# Patient Record
Sex: Female | Born: 1950 | Race: White | Hispanic: No | Smoking: Former smoker
Health system: Southern US, Community
[De-identification: ages and names within clinical notes are randomized; demographics above are authoritative.]

## PROBLEM LIST (undated history)

## (undated) DIAGNOSIS — I495 Sick sinus syndrome: Secondary | ICD-10-CM

## (undated) DIAGNOSIS — Z974 Presence of external hearing-aid: Secondary | ICD-10-CM

## (undated) DIAGNOSIS — M810 Age-related osteoporosis without current pathological fracture: Secondary | ICD-10-CM

## (undated) DIAGNOSIS — I10 Essential (primary) hypertension: Secondary | ICD-10-CM

## (undated) DIAGNOSIS — Z973 Presence of spectacles and contact lenses: Secondary | ICD-10-CM

## (undated) DIAGNOSIS — Z950 Presence of cardiac pacemaker: Secondary | ICD-10-CM

## (undated) DIAGNOSIS — G709 Myoneural disorder, unspecified: Secondary | ICD-10-CM

## (undated) DIAGNOSIS — T4145XA Adverse effect of unspecified anesthetic, initial encounter: Secondary | ICD-10-CM

## (undated) DIAGNOSIS — I82409 Acute embolism and thrombosis of unspecified deep veins of unspecified lower extremity: Secondary | ICD-10-CM

## (undated) DIAGNOSIS — I2699 Other pulmonary embolism without acute cor pulmonale: Secondary | ICD-10-CM

## (undated) DIAGNOSIS — D472 Monoclonal gammopathy: Secondary | ICD-10-CM

## (undated) DIAGNOSIS — D869 Sarcoidosis, unspecified: Secondary | ICD-10-CM

## (undated) DIAGNOSIS — Z9221 Personal history of antineoplastic chemotherapy: Secondary | ICD-10-CM

## (undated) DIAGNOSIS — Z8719 Personal history of other diseases of the digestive system: Secondary | ICD-10-CM

## (undated) DIAGNOSIS — G4730 Sleep apnea, unspecified: Secondary | ICD-10-CM

## (undated) DIAGNOSIS — C801 Malignant (primary) neoplasm, unspecified: Secondary | ICD-10-CM

## (undated) DIAGNOSIS — H269 Unspecified cataract: Secondary | ICD-10-CM

## (undated) DIAGNOSIS — K219 Gastro-esophageal reflux disease without esophagitis: Secondary | ICD-10-CM

## (undated) DIAGNOSIS — M1990 Unspecified osteoarthritis, unspecified site: Secondary | ICD-10-CM

## (undated) DIAGNOSIS — Z87442 Personal history of urinary calculi: Secondary | ICD-10-CM

## (undated) DIAGNOSIS — R112 Nausea with vomiting, unspecified: Secondary | ICD-10-CM

## (undated) DIAGNOSIS — Z9889 Other specified postprocedural states: Secondary | ICD-10-CM

## (undated) DIAGNOSIS — T8859XA Other complications of anesthesia, initial encounter: Secondary | ICD-10-CM

## (undated) HISTORY — PX: OTHER SURGICAL HISTORY: SHX169

## (undated) HISTORY — PX: COSMETIC SURGERY: SHX468

## (undated) HISTORY — DX: Other pulmonary embolism without acute cor pulmonale: I26.99

## (undated) HISTORY — DX: Age-related osteoporosis without current pathological fracture: M81.0

## (undated) HISTORY — DX: Monoclonal gammopathy: D47.2

## (undated) HISTORY — DX: Unspecified osteoarthritis, unspecified site: M19.90

## (undated) HISTORY — DX: Unspecified cataract: H26.9

## (undated) HISTORY — PX: CARPAL TUNNEL RELEASE: SHX101

## (undated) HISTORY — DX: Sleep apnea, unspecified: G47.30

## (undated) HISTORY — DX: Acute embolism and thrombosis of unspecified deep veins of unspecified lower extremity: I82.409

## (undated) HISTORY — DX: Malignant (primary) neoplasm, unspecified: C80.1

## (undated) HISTORY — PX: HAND SURGERY: SHX662

## (undated) HISTORY — PX: SPINE SURGERY: SHX786

---
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
## 1973-10-05 HISTORY — PX: GANGLION CYST EXCISION: SHX1691

## 1978-10-05 HISTORY — PX: OTHER SURGICAL HISTORY: SHX169

## 1978-10-05 HISTORY — PX: ECTOPIC PREGNANCY SURGERY: SHX613

## 1995-10-06 HISTORY — PX: BACK SURGERY: SHX140

## 1998-11-01 ENCOUNTER — Other Ambulatory Visit: Admission: RE | Admit: 1998-11-01 | Discharge: 1998-11-01 | Payer: Self-pay | Admitting: Family Medicine

## 2000-09-16 ENCOUNTER — Other Ambulatory Visit: Admission: RE | Admit: 2000-09-16 | Discharge: 2000-09-16 | Payer: Self-pay | Admitting: Obstetrics and Gynecology

## 2000-09-17 ENCOUNTER — Encounter (INDEPENDENT_AMBULATORY_CARE_PROVIDER_SITE_OTHER): Payer: Self-pay | Admitting: Specialist

## 2000-09-17 ENCOUNTER — Other Ambulatory Visit: Admission: RE | Admit: 2000-09-17 | Discharge: 2000-09-17 | Payer: Self-pay | Admitting: Obstetrics and Gynecology

## 2001-05-23 ENCOUNTER — Ambulatory Visit (HOSPITAL_COMMUNITY): Admission: RE | Admit: 2001-05-23 | Discharge: 2001-05-23 | Payer: Self-pay | Admitting: Gastroenterology

## 2003-05-21 ENCOUNTER — Encounter: Payer: Self-pay | Admitting: Family Medicine

## 2003-05-21 ENCOUNTER — Ambulatory Visit (HOSPITAL_COMMUNITY): Admission: RE | Admit: 2003-05-21 | Discharge: 2003-05-21 | Payer: Self-pay | Admitting: Family Medicine

## 2005-10-05 HISTORY — PX: COSMETIC SURGERY: SHX468

## 2009-06-10 ENCOUNTER — Emergency Department (HOSPITAL_COMMUNITY): Admission: EM | Admit: 2009-06-10 | Discharge: 2009-06-10 | Payer: Self-pay | Admitting: Family Medicine

## 2009-10-05 DIAGNOSIS — Z9221 Personal history of antineoplastic chemotherapy: Secondary | ICD-10-CM

## 2009-10-05 DIAGNOSIS — C801 Malignant (primary) neoplasm, unspecified: Secondary | ICD-10-CM

## 2009-10-05 HISTORY — DX: Malignant (primary) neoplasm, unspecified: C80.1

## 2009-10-05 HISTORY — PX: ABDOMINAL HYSTERECTOMY: SHX81

## 2009-10-05 HISTORY — PX: OTHER SURGICAL HISTORY: SHX169

## 2009-10-05 HISTORY — DX: Personal history of antineoplastic chemotherapy: Z92.21

## 2009-10-05 HISTORY — PX: APPENDECTOMY: SHX54

## 2009-11-11 ENCOUNTER — Encounter: Admission: RE | Admit: 2009-11-11 | Discharge: 2009-11-11 | Payer: Self-pay | Admitting: Family Medicine

## 2009-11-28 ENCOUNTER — Ambulatory Visit: Admission: RE | Admit: 2009-11-28 | Discharge: 2009-11-28 | Payer: Self-pay | Admitting: Gynecologic Oncology

## 2009-12-03 ENCOUNTER — Inpatient Hospital Stay (HOSPITAL_COMMUNITY): Admission: RE | Admit: 2009-12-03 | Discharge: 2009-12-05 | Payer: Self-pay | Admitting: Obstetrics & Gynecology

## 2009-12-03 ENCOUNTER — Encounter (INDEPENDENT_AMBULATORY_CARE_PROVIDER_SITE_OTHER): Payer: Self-pay | Admitting: Obstetrics & Gynecology

## 2009-12-05 ENCOUNTER — Ambulatory Visit: Payer: Self-pay | Admitting: Oncology

## 2009-12-12 ENCOUNTER — Ambulatory Visit: Admission: RE | Admit: 2009-12-12 | Discharge: 2009-12-12 | Payer: Self-pay | Admitting: Gynecologic Oncology

## 2009-12-23 LAB — COMPREHENSIVE METABOLIC PANEL
ALT: 14 U/L (ref 0–35)
Alkaline Phosphatase: 89 U/L (ref 39–117)
BUN: 12 mg/dL (ref 6–23)
Chloride: 102 mEq/L (ref 96–112)
Creatinine, Ser: 0.62 mg/dL (ref 0.40–1.20)
Glucose, Bld: 99 mg/dL (ref 70–99)
Sodium: 138 mEq/L (ref 135–145)
Total Bilirubin: 0.4 mg/dL (ref 0.3–1.2)
Total Protein: 6.8 g/dL (ref 6.0–8.3)

## 2009-12-23 LAB — CBC WITH DIFFERENTIAL/PLATELET
BASO%: 0.3 % (ref 0.0–2.0)
Basophils Absolute: 0 10*3/uL (ref 0.0–0.1)
EOS%: 2 % (ref 0.0–7.0)
LYMPH%: 28.7 % (ref 14.0–49.7)
MCH: 29.8 pg (ref 25.1–34.0)
NEUT%: 61 % (ref 38.4–76.8)
RBC: 4.02 10*6/uL (ref 3.70–5.45)

## 2009-12-23 LAB — CA 125: CA 125: 32.7 U/mL — ABNORMAL HIGH (ref 0.0–30.2)

## 2010-01-01 LAB — CBC WITH DIFFERENTIAL/PLATELET
BASO%: 0.4 % (ref 0.0–2.0)
Basophils Absolute: 0 10*3/uL (ref 0.0–0.1)
HGB: 11.7 g/dL (ref 11.6–15.9)
MCV: 87.1 fL (ref 79.5–101.0)
MONO%: 2.6 % (ref 0.0–14.0)
NEUT%: 51.5 % (ref 38.4–76.8)
Platelets: 153 10*3/uL (ref 145–400)
WBC: 3 10*3/uL — ABNORMAL LOW (ref 3.9–10.3)

## 2010-01-06 ENCOUNTER — Ambulatory Visit: Payer: Self-pay | Admitting: Oncology

## 2010-01-06 LAB — CBC WITH DIFFERENTIAL/PLATELET
BASO%: 0.4 % (ref 0.0–2.0)
Basophils Absolute: 0 10*3/uL (ref 0.0–0.1)
EOS%: 2.5 % (ref 0.0–7.0)
Eosinophils Absolute: 0 10*3/uL (ref 0.0–0.5)
HCT: 31.2 % — ABNORMAL LOW (ref 34.8–46.6)
HGB: 10.7 g/dL — ABNORMAL LOW (ref 11.6–15.9)
LYMPH%: 73.5 % — ABNORMAL HIGH (ref 14.0–49.7)
MCH: 29.9 pg (ref 25.1–34.0)
NEUT%: 6 % — ABNORMAL LOW (ref 38.4–76.8)
RBC: 3.58 10*6/uL — ABNORMAL LOW (ref 3.70–5.45)
WBC: 1.7 10*3/uL — ABNORMAL LOW (ref 3.9–10.3)
lymph#: 1.2 10*3/uL (ref 0.9–3.3)

## 2010-01-07 LAB — CBC WITH DIFFERENTIAL/PLATELET
BASO%: 0.8 % (ref 0.0–2.0)
Basophils Absolute: 0 10*3/uL (ref 0.0–0.1)
EOS%: 2.4 % (ref 0.0–7.0)
Eosinophils Absolute: 0.1 10*3/uL (ref 0.0–0.5)
MCHC: 34.1 g/dL (ref 31.5–36.0)
MCV: 87.6 fL (ref 79.5–101.0)
MONO#: 0.5 10*3/uL (ref 0.1–0.9)
NEUT#: 0.6 10*3/uL — ABNORMAL LOW (ref 1.5–6.5)
WBC: 2.7 10*3/uL — ABNORMAL LOW (ref 3.9–10.3)
lymph#: 1.4 10*3/uL (ref 0.9–3.3)

## 2010-01-08 LAB — COMPREHENSIVE METABOLIC PANEL
Albumin: 4.3 g/dL (ref 3.5–5.2)
Chloride: 103 mEq/L (ref 96–112)
Creatinine, Ser: 0.62 mg/dL (ref 0.40–1.20)
Potassium: 3.5 mEq/L (ref 3.5–5.3)
Sodium: 140 mEq/L (ref 135–145)
Total Bilirubin: 0.3 mg/dL (ref 0.3–1.2)

## 2010-01-08 LAB — CBC WITH DIFFERENTIAL/PLATELET
BASO%: 0.3 % (ref 0.0–2.0)
HGB: 11.3 g/dL — ABNORMAL LOW (ref 11.6–15.9)
LYMPH%: 29.4 % (ref 14.0–49.7)
MCH: 30.1 pg (ref 25.1–34.0)
MCHC: 34 g/dL (ref 31.5–36.0)
MONO#: 0.7 10*3/uL (ref 0.1–0.9)
MONO%: 13.6 % (ref 0.0–14.0)
NEUT#: 2.8 10*3/uL (ref 1.5–6.5)
RBC: 3.77 10*6/uL (ref 3.70–5.45)
RDW: 13.5 % (ref 11.2–14.5)

## 2010-01-08 LAB — CA 125: CA 125: 12.5 U/mL (ref 0.0–30.2)

## 2010-01-14 LAB — CBC WITH DIFFERENTIAL/PLATELET
BASO%: 0.2 % (ref 0.0–2.0)
EOS%: 0.4 % (ref 0.0–7.0)
Eosinophils Absolute: 0 10*3/uL (ref 0.0–0.5)
HCT: 33 % — ABNORMAL LOW (ref 34.8–46.6)
HGB: 11.1 g/dL — ABNORMAL LOW (ref 11.6–15.9)
LYMPH%: 43.8 % (ref 14.0–49.7)
MCH: 29.3 pg (ref 25.1–34.0)
NEUT#: 2.3 10*3/uL (ref 1.5–6.5)
RBC: 3.79 10*6/uL (ref 3.70–5.45)
RDW: 14.6 % — ABNORMAL HIGH (ref 11.2–14.5)
WBC: 4.7 10*3/uL (ref 3.9–10.3)

## 2010-01-21 ENCOUNTER — Ambulatory Visit: Admission: RE | Admit: 2010-01-21 | Discharge: 2010-01-21 | Payer: Self-pay | Admitting: Gynecologic Oncology

## 2010-01-21 LAB — CBC WITH DIFFERENTIAL/PLATELET
Basophils Absolute: 0 10*3/uL (ref 0.0–0.1)
HGB: 11.5 g/dL — ABNORMAL LOW (ref 11.6–15.9)
MCV: 88 fL (ref 79.5–101.0)
MONO%: 8.4 % (ref 0.0–14.0)
NEUT#: 2 10*3/uL (ref 1.5–6.5)
Platelets: 209 10*3/uL (ref 145–400)
RBC: 3.91 10*6/uL (ref 3.70–5.45)
RDW: 15.2 % — ABNORMAL HIGH (ref 11.2–14.5)
WBC: 4.5 10*3/uL (ref 3.9–10.3)
lymph#: 2.1 10*3/uL (ref 0.9–3.3)

## 2010-01-28 LAB — CA 125: CA 125: 9.9 U/mL (ref 0.0–30.2)

## 2010-01-28 LAB — CBC WITH DIFFERENTIAL/PLATELET
BASO%: 0.3 % (ref 0.0–2.0)
Basophils Absolute: 0 10*3/uL (ref 0.0–0.1)
EOS%: 1.7 % (ref 0.0–7.0)
HGB: 11.1 g/dL — ABNORMAL LOW (ref 11.6–15.9)
LYMPH%: 46.3 % (ref 14.0–49.7)
MCHC: 34.3 g/dL (ref 31.5–36.0)
MCV: 89.4 fL (ref 79.5–101.0)
MONO#: 0.5 10*3/uL (ref 0.1–0.9)
NEUT#: 1.2 10*3/uL — ABNORMAL LOW (ref 1.5–6.5)
RBC: 3.64 10*6/uL — ABNORMAL LOW (ref 3.70–5.45)
RDW: 16.2 % — ABNORMAL HIGH (ref 11.2–14.5)

## 2010-01-28 LAB — COMPREHENSIVE METABOLIC PANEL
Alkaline Phosphatase: 85 U/L (ref 39–117)
BUN: 11 mg/dL (ref 6–23)
Chloride: 101 mEq/L (ref 96–112)
Creatinine, Ser: 0.63 mg/dL (ref 0.40–1.20)
Sodium: 137 mEq/L (ref 135–145)
Total Protein: 6.3 g/dL (ref 6.0–8.3)

## 2010-02-03 LAB — CBC WITH DIFFERENTIAL/PLATELET
BASO%: 0.1 % (ref 0.0–2.0)
Basophils Absolute: 0 10*3/uL (ref 0.0–0.1)
HGB: 10.8 g/dL — ABNORMAL LOW (ref 11.6–15.9)
MCH: 29.6 pg (ref 25.1–34.0)
MCHC: 33.8 g/dL (ref 31.5–36.0)
MONO%: 8.2 % (ref 0.0–14.0)
NEUT#: 10.1 10*3/uL — ABNORMAL HIGH (ref 1.5–6.5)
RBC: 3.65 10*6/uL — ABNORMAL LOW (ref 3.70–5.45)
RDW: 15.6 % — ABNORMAL HIGH (ref 11.2–14.5)
WBC: 13.2 10*3/uL — ABNORMAL HIGH (ref 3.9–10.3)
lymph#: 2.1 10*3/uL (ref 0.9–3.3)

## 2010-02-11 ENCOUNTER — Ambulatory Visit: Payer: Self-pay | Admitting: Oncology

## 2010-02-11 LAB — CBC WITH DIFFERENTIAL/PLATELET
BASO%: 0.2 % (ref 0.0–2.0)
Basophils Absolute: 0 10*3/uL (ref 0.0–0.1)
HCT: 31.8 % — ABNORMAL LOW (ref 34.8–46.6)
HGB: 10.7 g/dL — ABNORMAL LOW (ref 11.6–15.9)
LYMPH%: 29.9 % (ref 14.0–49.7)
MCH: 29.6 pg (ref 25.1–34.0)
MCHC: 33.6 g/dL (ref 31.5–36.0)
MCV: 87.8 fL (ref 79.5–101.0)
MONO#: 0.5 10*3/uL (ref 0.1–0.9)
NEUT%: 59.5 % (ref 38.4–76.8)
Platelets: 189 10*3/uL (ref 145–400)
RBC: 3.62 10*6/uL — ABNORMAL LOW (ref 3.70–5.45)

## 2010-02-19 ENCOUNTER — Ambulatory Visit: Admission: RE | Admit: 2010-02-19 | Discharge: 2010-02-19 | Payer: Self-pay | Admitting: Gynecologic Oncology

## 2010-02-19 LAB — CBC WITH DIFFERENTIAL/PLATELET
EOS%: 0.3 % (ref 0.0–7.0)
Eosinophils Absolute: 0 10*3/uL (ref 0.0–0.5)
HGB: 10.7 g/dL — ABNORMAL LOW (ref 11.6–15.9)
MCHC: 34.5 g/dL (ref 31.5–36.0)
MCV: 89.8 fL (ref 79.5–101.0)
MONO#: 0.7 10*3/uL (ref 0.1–0.9)
RDW: 17.7 % — ABNORMAL HIGH (ref 11.2–14.5)
WBC: 5.7 10*3/uL (ref 3.9–10.3)
lymph#: 1.2 10*3/uL (ref 0.9–3.3)

## 2010-02-19 LAB — COMPREHENSIVE METABOLIC PANEL
ALT: <8 U/L (ref 0–35)
AST: 13 U/L (ref 0–37)
Albumin: 4 g/dL (ref 3.5–5.2)
Alkaline Phosphatase: 113 U/L (ref 39–117)
Calcium: 9.1 mg/dL (ref 8.4–10.5)
Chloride: 103 mEq/L (ref 96–112)
Creatinine, Ser: 0.59 mg/dL (ref 0.40–1.20)
Sodium: 139 mEq/L (ref 135–145)
Total Protein: 6.3 g/dL (ref 6.0–8.3)

## 2010-02-20 ENCOUNTER — Ambulatory Visit: Payer: Self-pay | Admitting: Genetic Counselor

## 2010-03-05 LAB — CBC WITH DIFFERENTIAL/PLATELET
BASO%: 0.7 % (ref 0.0–2.0)
Basophils Absolute: 0 10*3/uL (ref 0.0–0.1)
EOS%: 0.2 % (ref 0.0–7.0)
Eosinophils Absolute: 0 10*3/uL (ref 0.0–0.5)
HCT: 30.2 % — ABNORMAL LOW (ref 34.8–46.6)
HGB: 10.7 g/dL — ABNORMAL LOW (ref 11.6–15.9)
LYMPH%: 30.1 % (ref 14.0–49.7)
MCH: 32.1 pg (ref 25.1–34.0)
MCHC: 35.4 g/dL (ref 31.5–36.0)
MCV: 90.7 fL (ref 79.5–101.0)
MONO#: 0.4 10*3/uL (ref 0.1–0.9)
MONO%: 9.2 % (ref 0.0–14.0)
NEUT#: 2.7 10*3/uL (ref 1.5–6.5)
NEUT%: 59.8 % (ref 38.4–76.8)
Platelets: 127 10*3/uL — ABNORMAL LOW (ref 145–400)
RBC: 3.33 10*6/uL — ABNORMAL LOW (ref 3.70–5.45)
RDW: 19.8 % — ABNORMAL HIGH (ref 11.2–14.5)
WBC: 4.6 10*3/uL (ref 3.9–10.3)
lymph#: 1.4 10*3/uL (ref 0.9–3.3)

## 2010-03-20 ENCOUNTER — Ambulatory Visit: Payer: Self-pay | Admitting: Oncology

## 2010-03-21 LAB — CBC WITH DIFFERENTIAL/PLATELET
Basophils Absolute: 0 10*3/uL (ref 0.0–0.1)
EOS%: 0.5 % (ref 0.0–7.0)
Eosinophils Absolute: 0 10*3/uL (ref 0.0–0.5)
HCT: 29.6 % — ABNORMAL LOW (ref 34.8–46.6)
HGB: 10.1 g/dL — ABNORMAL LOW (ref 11.6–15.9)
MCH: 32 pg (ref 25.1–34.0)
MCV: 94.1 fL (ref 79.5–101.0)
MONO#: 0.4 10*3/uL (ref 0.1–0.9)
MONO%: 7.1 % (ref 0.0–14.0)
RBC: 3.14 10*6/uL — ABNORMAL LOW (ref 3.70–5.45)
RDW: 19.2 % — ABNORMAL HIGH (ref 11.2–14.5)
WBC: 5.4 10*3/uL (ref 3.9–10.3)
lymph#: 1.3 10*3/uL (ref 0.9–3.3)

## 2010-03-21 LAB — COMPREHENSIVE METABOLIC PANEL
AST: 12 U/L (ref 0–37)
Alkaline Phosphatase: 110 U/L (ref 39–117)
BUN: 11 mg/dL (ref 6–23)
Chloride: 103 mEq/L (ref 96–112)
Sodium: 141 mEq/L (ref 135–145)
Total Bilirubin: 0.3 mg/dL (ref 0.3–1.2)

## 2010-03-21 LAB — CA 125: CA 125: 8.9 U/mL (ref 0.0–30.2)

## 2010-03-26 LAB — CBC WITH DIFFERENTIAL/PLATELET
EOS%: 0 % (ref 0.0–7.0)
Eosinophils Absolute: 0 10*3/uL (ref 0.0–0.5)
MCHC: 34.3 g/dL (ref 31.5–36.0)
MONO#: 0 10*3/uL — ABNORMAL LOW (ref 0.1–0.9)
MONO%: 0.1 % (ref 0.0–14.0)
NEUT%: 84.8 % — ABNORMAL HIGH (ref 38.4–76.8)
RBC: 3.38 10*6/uL — ABNORMAL LOW (ref 3.70–5.45)
RDW: 18.6 % — ABNORMAL HIGH (ref 11.2–14.5)
WBC: 6.2 10*3/uL (ref 3.9–10.3)
lymph#: 0.9 10*3/uL (ref 0.9–3.3)

## 2010-04-11 LAB — CBC WITH DIFFERENTIAL/PLATELET
BASO%: 0.3 % (ref 0.0–2.0)
Eosinophils Absolute: 0 10*3/uL (ref 0.0–0.5)
HCT: 30 % — ABNORMAL LOW (ref 34.8–46.6)
MCH: 32.5 pg (ref 25.1–34.0)
MCHC: 33.8 g/dL (ref 31.5–36.0)
MCV: 96.1 fL (ref 79.5–101.0)
MONO#: 0.5 10*3/uL (ref 0.1–0.9)
MONO%: 9.7 % (ref 0.0–14.0)
NEUT#: 3 10*3/uL (ref 1.5–6.5)
NEUT%: 59.7 % (ref 38.4–76.8)
RBC: 3.12 10*6/uL — ABNORMAL LOW (ref 3.70–5.45)

## 2010-04-11 LAB — COMPREHENSIVE METABOLIC PANEL
Alkaline Phosphatase: 115 U/L (ref 39–117)
CO2: 24 mEq/L (ref 19–32)
Calcium: 9.2 mg/dL (ref 8.4–10.5)
Total Bilirubin: 0.4 mg/dL (ref 0.3–1.2)

## 2010-04-16 LAB — CBC WITH DIFFERENTIAL/PLATELET
BASO%: 0.2 % (ref 0.0–2.0)
EOS%: 0 % (ref 0.0–7.0)
Eosinophils Absolute: 0 10*3/uL (ref 0.0–0.5)
HCT: 32.4 % — ABNORMAL LOW (ref 34.8–46.6)
HGB: 10.9 g/dL — ABNORMAL LOW (ref 11.6–15.9)
LYMPH%: 14.9 % (ref 14.0–49.7)
MCHC: 33.6 g/dL (ref 31.5–36.0)
MCV: 94.5 fL (ref 79.5–101.0)
MONO#: 0 10*3/uL — ABNORMAL LOW (ref 0.1–0.9)
MONO%: 0.4 % (ref 0.0–14.0)
NEUT#: 4 10*3/uL (ref 1.5–6.5)
Platelets: 117 10*3/uL — ABNORMAL LOW (ref 145–400)
RBC: 3.43 10*6/uL — ABNORMAL LOW (ref 3.70–5.45)
RDW: 15.9 % — ABNORMAL HIGH (ref 11.2–14.5)
lymph#: 0.7 10*3/uL — ABNORMAL LOW (ref 0.9–3.3)

## 2010-04-24 ENCOUNTER — Ambulatory Visit: Payer: Self-pay | Admitting: Oncology

## 2010-04-28 LAB — CBC WITH DIFFERENTIAL/PLATELET
Basophils Absolute: 0 10*3/uL (ref 0.0–0.1)
EOS%: 0.7 % (ref 0.0–7.0)
Eosinophils Absolute: 0 10*3/uL (ref 0.0–0.5)
HCT: 29.8 % — ABNORMAL LOW (ref 34.8–46.6)
MCHC: 34 g/dL (ref 31.5–36.0)
MONO%: 3.3 % (ref 0.0–14.0)
RBC: 3.02 10*6/uL — ABNORMAL LOW (ref 3.70–5.45)
WBC: 7 10*3/uL (ref 3.9–10.3)

## 2010-05-20 LAB — CBC WITH DIFFERENTIAL/PLATELET
Eosinophils Absolute: 0 10*3/uL (ref 0.0–0.5)
LYMPH%: 43.5 % (ref 14.0–49.7)
MCH: 34.7 pg — ABNORMAL HIGH (ref 25.1–34.0)
MCHC: 34.8 g/dL (ref 31.5–36.0)
MCV: 99.5 fL (ref 79.5–101.0)
MONO#: 0.3 10*3/uL (ref 0.1–0.9)
NEUT#: 1.5 10*3/uL (ref 1.5–6.5)
NEUT%: 45.9 % (ref 38.4–76.8)
RBC: 3.19 10*6/uL — ABNORMAL LOW (ref 3.70–5.45)
RDW: 15.2 % — ABNORMAL HIGH (ref 11.2–14.5)
WBC: 3.3 10*3/uL — ABNORMAL LOW (ref 3.9–10.3)
lymph#: 1.4 10*3/uL (ref 0.9–3.3)

## 2010-05-20 LAB — BASIC METABOLIC PANEL
Chloride: 102 mEq/L (ref 96–112)
Creatinine, Ser: 0.59 mg/dL (ref 0.40–1.20)
Potassium: 3.9 mEq/L (ref 3.5–5.3)

## 2010-05-23 ENCOUNTER — Ambulatory Visit (HOSPITAL_COMMUNITY): Admission: RE | Admit: 2010-05-23 | Discharge: 2010-05-23 | Payer: Self-pay | Admitting: Oncology

## 2010-06-12 ENCOUNTER — Ambulatory Visit: Admission: RE | Admit: 2010-06-12 | Discharge: 2010-06-12 | Payer: Self-pay | Admitting: Gynecologic Oncology

## 2010-06-23 DIAGNOSIS — E785 Hyperlipidemia, unspecified: Secondary | ICD-10-CM

## 2010-06-23 DIAGNOSIS — Z8543 Personal history of malignant neoplasm of ovary: Secondary | ICD-10-CM | POA: Insufficient documentation

## 2010-06-23 DIAGNOSIS — C569 Malignant neoplasm of unspecified ovary: Secondary | ICD-10-CM

## 2010-06-23 DIAGNOSIS — I10 Essential (primary) hypertension: Secondary | ICD-10-CM | POA: Insufficient documentation

## 2010-06-23 DIAGNOSIS — C50919 Malignant neoplasm of unspecified site of unspecified female breast: Secondary | ICD-10-CM | POA: Insufficient documentation

## 2010-06-24 ENCOUNTER — Ambulatory Visit: Payer: Self-pay | Admitting: Internal Medicine

## 2010-06-24 DIAGNOSIS — R930 Abnormal findings on diagnostic imaging of skull and head, not elsewhere classified: Secondary | ICD-10-CM | POA: Insufficient documentation

## 2010-07-04 ENCOUNTER — Telehealth (INDEPENDENT_AMBULATORY_CARE_PROVIDER_SITE_OTHER): Payer: Self-pay | Admitting: *Deleted

## 2010-09-17 ENCOUNTER — Ambulatory Visit: Payer: Self-pay | Admitting: Oncology

## 2010-09-19 LAB — COMPREHENSIVE METABOLIC PANEL
ALT: 12 U/L (ref 0–35)
AST: 17 U/L (ref 0–37)
Albumin: 4 g/dL (ref 3.5–5.2)
Alkaline Phosphatase: 78 U/L (ref 39–117)
BUN: 20 mg/dL (ref 6–23)
CO2: 27 mEq/L (ref 19–32)
Calcium: 9.2 mg/dL (ref 8.4–10.5)
Chloride: 105 mEq/L (ref 96–112)
Creatinine, Ser: 0.57 mg/dL (ref 0.40–1.20)
Glucose, Bld: 82 mg/dL (ref 70–99)
Potassium: 3.8 mEq/L (ref 3.5–5.3)
Sodium: 141 mEq/L (ref 135–145)
Total Bilirubin: 0.4 mg/dL (ref 0.3–1.2)
Total Protein: 6.6 g/dL (ref 6.0–8.3)

## 2010-09-19 LAB — CBC WITH DIFFERENTIAL/PLATELET
BASO%: 0.3 % (ref 0.0–2.0)
Basophils Absolute: 0 10*3/uL (ref 0.0–0.1)
EOS%: 2.6 % (ref 0.0–7.0)
Eosinophils Absolute: 0.1 10*3/uL (ref 0.0–0.5)
HCT: 33.6 % — ABNORMAL LOW (ref 34.8–46.6)
HGB: 11.6 g/dL (ref 11.6–15.9)
LYMPH%: 38 % (ref 14.0–49.7)
MCH: 31.5 pg (ref 25.1–34.0)
MCHC: 34.7 g/dL (ref 31.5–36.0)
MCV: 90.8 fL (ref 79.5–101.0)
MONO#: 0.3 10*3/uL (ref 0.1–0.9)
MONO%: 8.8 % (ref 0.0–14.0)
NEUT#: 2 10*3/uL (ref 1.5–6.5)
NEUT%: 50.3 % (ref 38.4–76.8)
Platelets: 158 10*3/uL (ref 145–400)
RBC: 3.7 10*6/uL (ref 3.70–5.45)
RDW: 13.3 % (ref 11.2–14.5)
WBC: 3.9 10*3/uL (ref 3.9–10.3)
lymph#: 1.5 10*3/uL (ref 0.9–3.3)

## 2010-09-19 LAB — CA 125: CA 125: 8.4 U/mL (ref 0.0–30.2)

## 2010-11-06 NOTE — Progress Notes (Signed)
Summary: reviewing xray from 1997 > sent down for scanning  Phone Note Call from Patient Call back at [phone]   Caller: Patient Call For: wert Summary of Call: calling about xrays Initial call taken by: Rickard Patience,  July 04, 2010 3:47 PM  Follow-up for Phone Call        called and spoke with pt. pt last saw [facility] 06/24/2010.  Pt states she dropped of cxr films last Thursday 06/26/2010 dated 1997.  Pt wanted to know if MW wanted to look at these to compare to recent films done.  Pt would like MW to call her once he reviews them and would also like to pick films back up once he is finished reviewing them.  Will forward message to MW to address.  Pt is aware MW is out of the office this week and will return next week.  Aundra Millet Reynolds LPN  July 04, 2010 4:06 PM I sent this down to scan and compare to previous studies but I did look at it first and there appears to be minimal changes new vs old films that will need exactly the same f/u I rec at the ov and reflected in the instructions Follow-up by: Nyoka Cowden MD,  July 08, 2010 9:51 AM  Additional Follow-up for Phone Call Additional follow up Details #1::        Spoke with patient-she is aware of MW's recs-spoke with [facility] as patient wanted to pick up originals(states she placed a note on them regarding this). Iris stated there is no way to scan xrays into [facility] when there isnt an appt for a current Xray. Original xrays were mailed back to [facility] on 06-26-10; I called the patient and made her aware of this matter-she will pick up xrays again at [facility] so she can have a copy on hand. Reynaldo Minium CMA  July 08, 2010 10:56 AM

## 2010-11-06 NOTE — Assessment & Plan Note (Signed)
Summary: Pulmonary consultation/ bilat  infiltrates/adenopathy ? chonic ?   Visit Type:  Initial Consult Copy to:  Dr. Darrold Span Primary Provider/Referring Provider:  Dr. Ursula Beath  CC:  Abnornmal CT Chest.  History of Present Illness: 59 yowf quit smoking in 1978 no respiratory.  June 24, 2010  1st pulmonary office eval with hx of  walking pna in early 90's and mutiple xrays never normalized dating back to 1997.    now s/p ovarian ca surgery in March 2011 at [facility] then chemo completed in July.   Problem ct suggested ? evolving upper lobe processes bilaterally.  However, she has not symptoms whatsoever, no cough or sob. .Pt denies any significant sore throat, dysphagia, itching, sneezing,  nasal congestion or excess secretions,  fever, chills, sweats, unintended wt loss, pleuritic or exertional cp, hempoptysis, change in activity tolerance  orthopnea pnd or leg swelling   Current Medications (verified): 1)  Multivitamins  Tabs (Multiple Vitamin) .Marland Kitchen.. 1 Once Daily 2)  Citracal Plus  Tabs (Multiple Minerals-Vitamins) .Marland Kitchen.. 1 Once Daily  Allergies: 1)  ! Cipro 2)  ! * Latex  Past History:  Past Medical History: Hypertension Bilateral upper lobe infiltrates     - Present since 1997 plain cxr     - CT Chest May 23 2010 bilateral upper lobe vol loss, bilateral nonspecific adenopathy  Past Surgical History: TAH BSO 2011 Back surgery 1997  Family History: Breast CA- Paternal Aunt and Cousins  Colon CA- [employer] Prostate CA- Father  Heart dz- MGF  Social History: Divorced 2 daughters Former smoker. Quit in 1978.  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    Vital Signs:  Patient profile:   60 year old female Height:      65 inches Weight:      172.38 pounds BMI:     28.79 O2 Sat:      97 % on Room air Temp:     98.8 degrees F oral Pulse rate:   63 / minute BP sitting:   152 / 82  (left arm)  Vitals Entered By: Vernie Murders (June 24, 2010 10:27 AM)  O2 Flow:  Room air  Physical Exam  Additional Exam:  amb pleasant wf nad wt 172 June 24, 2010 HEENT: nl dentition, turbinates, and orophanx. Nl external ear canals without cough reflex NECK :  without JVD/Nodes/TM/ nl carotid upstrokes bilaterally LUNGS: no acc muscle use, clear to A and P bilaterally without cough on insp or exp maneuvers CV:  RRR  no s3 or murmur or increase in P2, no edema  ABD:  soft and nontender with nl excursion in the supine position. No bruits or organomegaly, bowel sounds nl MS:  warm without deformities, calf tenderness, cyanosis or clubbing SKIN: warm and dry without lesions   NEURO:  alert, approp, no deficits     Impression & Recommendations:  Problem # 1:  ABNORMAL LUNG XRAY (ICD-793.1)  Completely asymptomatic and present since 2007 with very minimal  serial change c/w "burned out" sarcoid or other inflammatory process (note h/o " walking pna never cleared" )    Discussed in detail all the  indications, usual  risks and alternatives  relative to the benefits with patient who agrees to proceed with conservative f/u with chest ct in one year per guidelines given the fact she'll need surveillance/ staging anyway at that point and this would not expose her to additional rt unneccessarily.  Orders: Consultation Level IV (04540)  Medications Added to  Medication List This Visit: 1)  Multivitamins Tabs (Multiple vitamin) .Marland Kitchen.. 1 once daily 2)  Citracal Plus Tabs (Multiple minerals-vitamins) .Marland Kitchen.. 1 once daily  Patient Instructions: 1)  It is reasonable to do a full ct chest and abd as part of your follow up and staging for ovarian cancer in 05/2011 (vs PET but not both) but call sooner for usual cough or pain when breath and if you can remember to bring your old cxr with you.

## 2010-12-09 ENCOUNTER — Other Ambulatory Visit: Payer: Self-pay | Admitting: Obstetrics & Gynecology

## 2010-12-26 LAB — DIFFERENTIAL
Basophils Relative: 0 % (ref 0–1)
Eosinophils Absolute: 0.1 10*3/uL (ref 0.0–0.7)
Lymphs Abs: 1.4 10*3/uL (ref 0.7–4.0)
Monocytes Absolute: 0.4 10*3/uL (ref 0.1–1.0)
Monocytes Relative: 8 % (ref 3–12)

## 2010-12-26 LAB — COMPREHENSIVE METABOLIC PANEL
ALT: 14 U/L (ref 0–35)
AST: 19 U/L (ref 0–37)
Albumin: 3.4 g/dL — ABNORMAL LOW (ref 3.5–5.2)
Alkaline Phosphatase: 85 U/L (ref 39–117)
Calcium: 9.4 mg/dL (ref 8.4–10.5)
GFR calc Af Amer: >60 mL/min (ref >60)
Potassium: 3.4 mEq/L — ABNORMAL LOW (ref 3.5–5.1)
Sodium: 139 mEq/L (ref 135–145)
Total Protein: 6.6 g/dL (ref 6.0–8.3)

## 2010-12-26 LAB — CBC
HCT: 37.5 % (ref 36.0–46.0)
MCHC: 34.1 g/dL (ref 30.0–36.0)
Platelets: 193 10*3/uL (ref 150–400)
WBC: 5.1 10*3/uL (ref 4.0–10.5)

## 2010-12-29 LAB — BASIC METABOLIC PANEL
CO2: 30 mEq/L (ref 19–32)
Calcium: 7.6 mg/dL — ABNORMAL LOW (ref 8.4–10.5)
GFR calc Af Amer: >60 mL/min (ref >60)
GFR calc non Af Amer: >60 mL/min (ref >60)
Glucose, Bld: 130 mg/dL — ABNORMAL HIGH (ref 70–99)
Potassium: 3.4 mEq/L — ABNORMAL LOW (ref 3.5–5.1)
Sodium: 136 mEq/L (ref 135–145)

## 2010-12-29 LAB — CBC
HCT: 31.4 % — ABNORMAL LOW (ref 36.0–46.0)
Hemoglobin: 10.1 g/dL — ABNORMAL LOW (ref 12.0–15.0)
MCHC: 32.2 g/dL (ref 30.0–36.0)
RBC: 3.56 MIL/uL — ABNORMAL LOW (ref 3.87–5.11)
RDW: 13.1 % (ref 11.5–15.5)

## 2011-02-20 NOTE — Procedures (Signed)
[facility]  Patient:    Julia Solis Visit Number: [id number] MRN: [id number]          Service Type: Attending:  Fayrene Fearing L. Randa Evens, M.D. Proc. Date: 05/23/01   CC:         Doreatha Lew, M.D.   Procedure Report  PROCEDURE:  Colonoscopy.  MEDICATIONS:  Fentanyl 125 mcg, Versed 10 mg IV.  SCOPE:  Pediatric video colonoscope.  INDICATIONS:  Heme-positive stool and rectal bleeding.  DESCRIPTION OF PROCEDURE:  The procedure had been explained to the patient and consent obtained.  With the patient in the left lateral decubitus position, the Olympus pediatric video colonoscope was inserted and advanced under direct visualization.  The prep was excellent.  We were able to advance to the cecum. The ileocecal valve was entered for a short distance and was normal.  The scope was withdrawn and the colon carefully examined on withdrawal.  The colonic mucosa was completely normal throughout.  No polyps or other lesions were seen.  No significant diverticulosis.  There were internal hemorrhoids seen in the rectum.  The scope was withdrawn, and The patient tolerated the procedure well and was maintained on low-flow oxygen and pulse oximetry throughout the procedure.  ASSESSMENT:  Internal hemorrhoids, probably the source of rectal bleeding.  PLAN:  We will give a hemorrhoid sheet.  Routine follow-up with yearly Hemoccults, etc. Attending:  Llana Aliment. Randa Evens, M.D. DD:  05/23/01 TD:  05/23/01 Job: [id number]

## 2011-03-27 ENCOUNTER — Other Ambulatory Visit: Payer: Self-pay | Admitting: Oncology

## 2011-03-27 ENCOUNTER — Encounter (HOSPITAL_BASED_OUTPATIENT_CLINIC_OR_DEPARTMENT_OTHER): Payer: BC Managed Care – PPO | Admitting: Oncology

## 2011-03-27 DIAGNOSIS — C569 Malignant neoplasm of unspecified ovary: Secondary | ICD-10-CM

## 2011-03-27 DIAGNOSIS — Z5111 Encounter for antineoplastic chemotherapy: Secondary | ICD-10-CM

## 2011-03-27 DIAGNOSIS — Z5189 Encounter for other specified aftercare: Secondary | ICD-10-CM

## 2011-03-27 LAB — CBC WITH DIFFERENTIAL/PLATELET
Basophils Absolute: 0 10*3/uL (ref 0.0–0.1)
EOS%: 2.3 % (ref 0.0–7.0)
Eosinophils Absolute: 0.1 10*3/uL (ref 0.0–0.5)
HGB: 12.4 g/dL (ref 11.6–15.9)
LYMPH%: 38.2 % (ref 14.0–49.7)
MCH: 30.7 pg (ref 25.1–34.0)
MCV: 89 fL (ref 79.5–101.0)
MONO%: 8.6 % (ref 0.0–14.0)
NEUT#: 2.5 10*3/uL (ref 1.5–6.5)
Platelets: 180 10*3/uL (ref 145–400)
RDW: 12.9 % (ref 11.2–14.5)

## 2011-03-27 LAB — COMPREHENSIVE METABOLIC PANEL
AST: 18 U/L (ref 0–37)
Albumin: 4.3 g/dL (ref 3.5–5.2)
Alkaline Phosphatase: 75 U/L (ref 39–117)
BUN: 16 mg/dL (ref 6–23)
Creatinine, Ser: 0.68 mg/dL (ref 0.50–1.10)
Glucose, Bld: 86 mg/dL (ref 70–99)
Total Bilirubin: 0.4 mg/dL (ref 0.3–1.2)

## 2011-05-26 ENCOUNTER — Encounter: Payer: BC Managed Care – PPO | Admitting: Oncology

## 2011-05-26 ENCOUNTER — Other Ambulatory Visit: Payer: Self-pay | Admitting: Oncology

## 2011-05-26 LAB — BASIC METABOLIC PANEL
BUN: 18 mg/dL (ref 6–23)
CO2: 30 mEq/L (ref 19–32)
Chloride: 101 mEq/L (ref 96–112)
Glucose, Bld: 97 mg/dL (ref 70–99)
Potassium: 3.9 mEq/L (ref 3.5–5.3)
Sodium: 138 mEq/L (ref 135–145)

## 2011-05-26 LAB — CA 125: CA 125: 8.6 U/mL (ref 0.0–30.2)

## 2011-06-02 ENCOUNTER — Ambulatory Visit (HOSPITAL_COMMUNITY)
Admission: RE | Admit: 2011-06-02 | Discharge: 2011-06-02 | Disposition: A | Discharge status: Home / Self Care | Payer: BC Managed Care – PPO | Source: Ambulatory Visit | Attending: Oncology | Admitting: Oncology

## 2011-06-02 DIAGNOSIS — C569 Malignant neoplasm of unspecified ovary: Secondary | ICD-10-CM

## 2011-06-02 DIAGNOSIS — R599 Enlarged lymph nodes, unspecified: Secondary | ICD-10-CM | POA: Insufficient documentation

## 2011-06-02 DIAGNOSIS — J984 Other disorders of lung: Secondary | ICD-10-CM | POA: Insufficient documentation

## 2011-06-02 DIAGNOSIS — Z9221 Personal history of antineoplastic chemotherapy: Secondary | ICD-10-CM | POA: Insufficient documentation

## 2011-06-02 MED ORDER — IOHEXOL 300 MG/ML  SOLN
100.0000 mL | Freq: Once | INTRAMUSCULAR | Status: AC | PRN
  Administered 2011-06-02: 100 mL via INTRAVENOUS

## 2011-06-04 ENCOUNTER — Telehealth: Payer: Self-pay | Admitting: Internal Medicine

## 2011-06-04 ENCOUNTER — Ambulatory Visit (HOSPITAL_COMMUNITY)
Admission: RE | Admit: 2011-06-04 | Discharge: 2011-06-04 | Disposition: A | Discharge status: Home / Self Care | Payer: BC Managed Care – PPO | Source: Ambulatory Visit | Attending: Gastroenterology | Admitting: Gastroenterology

## 2011-06-04 DIAGNOSIS — Z1211 Encounter for screening for malignant neoplasm of colon: Secondary | ICD-10-CM | POA: Insufficient documentation

## 2011-06-04 DIAGNOSIS — C569 Malignant neoplasm of unspecified ovary: Secondary | ICD-10-CM | POA: Insufficient documentation

## 2011-06-04 DIAGNOSIS — G609 Hereditary and idiopathic neuropathy, unspecified: Secondary | ICD-10-CM | POA: Insufficient documentation

## 2011-06-04 NOTE — Telephone Encounter (Signed)
Call patient : Study is unremarkable, no further lung studies needed from my perspective  Spoke with pt and she is aware of ct results. Pt verbalized understanding and had no questions

## 2011-06-14 NOTE — Op Note (Signed)
  NAMECITLALIC, NORLANDER NO.:  [id number]  MEDICAL RECORD NO.:  [id number]  LOCATION:  [facility]                         FACILITY:  [facility]  PHYSICIAN:  Danise Edge, M.D.   DATE OF BIRTH:  January 17, 1951  DATE OF PROCEDURE:  06/04/2011 DATE OF DISCHARGE:                              OPERATIVE REPORT   REFERRING PHYSICIAN:  Reece Packer, M.D.  HISTORY:  Ms. Julia Solis is a 60 year old female, born on 1951-05-05.  The patient was scheduled to undergo her second screening colonoscopy.  ENDOSCOPIST:  Danise Edge, M.D.  PREMEDICATION:  Propofol administered by Anesthesia.  PROCEDURE:  The patient was placed in the left lateral decubitus position.  Anal inspection and digital rectal exam were normal.  The Pentax pediatric colonoscope was introduced into the rectum and easily advanced to the cecum.  A normal-appearing ileocecal valve and appendiceal orifice were identified.  Colonic preparation for the exam today was good.  Rectum normal.  Retroflex view of the distal rectum normal.  Sigmoid colon and descending colon normal.  Splenic flexure normal.  Transverse colon normal.  Hepatic flexure normal.  Ascending colon normal.  Cecum and ileocecal valve normal.  ASSESSMENT:  Normal screening proctocolonoscopy to the cecum.  RECOMMENDATIONS:  Repeat screening colonoscopy in 10 years.          ______________________________ Danise Edge, M.D.     MJ/MEDQ  D:  06/04/2011  T:  06/04/2011  Job:  [id number]  cc:   Reece Packer, M.D. Fax: [phone]  Electronically Signed by Danise Edge M.D. on 06/14/2011 09:13:34 AM

## 2011-06-18 ENCOUNTER — Ambulatory Visit: Payer: BC Managed Care – PPO | Attending: Gynecologic Oncology | Admitting: Gynecologic Oncology

## 2011-06-18 DIAGNOSIS — G609 Hereditary and idiopathic neuropathy, unspecified: Secondary | ICD-10-CM | POA: Insufficient documentation

## 2011-06-18 DIAGNOSIS — Z90710 Acquired absence of both cervix and uterus: Secondary | ICD-10-CM | POA: Insufficient documentation

## 2011-06-18 DIAGNOSIS — C569 Malignant neoplasm of unspecified ovary: Secondary | ICD-10-CM | POA: Insufficient documentation

## 2011-06-18 DIAGNOSIS — I10 Essential (primary) hypertension: Secondary | ICD-10-CM | POA: Insufficient documentation

## 2011-06-19 NOTE — Consult Note (Signed)
  NAMEKINDRED, HEYING NO.:  [id number]  MEDICAL RECORD NO.:  [id number]  LOCATION:  GYN                          FACILITY:  [facility]  PHYSICIAN:  Laurette Schimke, MD     DATE OF BIRTH:  Aug 17, 1951  DATE OF CONSULTATION:  06/18/2011 DATE OF DISCHARGE:                                CONSULTATION   REASON FOR VISIT:  Ovarian cancer surveillance.  HISTORY OF PRESENT ILLNESS:  This is a 60 year old with stage IC ovarian cancer, clear cell, optimally debulked in March 2011.  She subsequently received 6 cycles of Taxol and carboplatin therapy, which is completed in July 2011.  No adjuvant therapy was recommended after completion of chemotherapy.  At the time of initial presentation, Ms. Guerrera was noted to have pulmonary nodules, they have been sequentially followed and remained without any change in size.  PAST MEDICAL HISTORY: 1. Hypertension diagnosed in 2011. 2. Herniated disk in 1997. 3. Eczema. 4. Stage IC clear cell ovarian cancer.  PAST SURGICAL HISTORY:  Left salpingectomy for ectopic pregnancy in 1980s, unspecified back surgery in 1997, left breast lumpectomy in 1993, total abdominal hysterectomy and bilateral salpingo-oophorectomy, pelvic and periaortic lymph node dissection, and omentectomy in 2012.  SOCIAL HISTORY:  Ms. Tenny Craw is of Jewish ancestry, genetic testing is negative for known mutations.  FAMILY HISTORY:  Several paternal aunts and cousins with breast cancer ranging from age of 101-70.  REVIEW OF SYSTEMS:  No headache, cough, or shortness of breath.  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  Decreased baseline energy.  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  Daily constipation, requires use of a stool softener.  No rectal bleeding.  No hematochezia, hematuria, or hematocolpos.  No swelling of the lower extremities.  No abdominal pain, nausea, vomiting, or diarrhea. Otherwise, 10-point review of systems is  negative.  PHYSICAL EXAMINATION:  GENERAL:  Well-developed female in no acute distress. VITAL SIGNS:  Weight 172 pounds.  Blood pressure 118/72, pulse 78, and temperature 98.4. CHEST:  Clear to auscultation. HEART:  Regular rate and rhythm. ABDOMEN:  Soft, obese, and nontender.  Midline incision evident without any hernia.  No palpable masses or fluid wave. PELVIC EXAMINATION:  Normal external genitalia, Bartholin's, urethral, and Skene's. Atrophic vagina without any palpable masses.  No lesions of vaginal cuff. RECTAL EXAMINATION:  Good anal sphincter tone without any masses.  No rectovaginal septum nodularity. EXTREMITIES:  No clubbing, cyanosis, or edema.  IMPRESSION:  Ms. Pepperman remains to have any evidence of disease 14 months status post treatment with optimal surgical staging and debulking followed by adjuvant Taxol and carboplatin therapy.  The plan is for her to follow up with Dr. Darrold Span in 3 months, with Dr. Arlyce Dice in March 2013, and she has been asked to return to Gynecologic Oncology in June.      Laurette Schimke, MD     WB/MEDQ  D:  06/18/2011  T:  06/18/2011  Job:  [id number]  cc:   Ilda Mori, M.D. Fax: [phone]  Reece Packer, M.D. Fax: [phone]  Telford Nab, R.N. [address] [place], [place] [address]  Electronically Signed by Laurette Schimke MD on 06/19/2011 11:02:30 AM

## 2011-07-03 ENCOUNTER — Encounter: Payer: Self-pay | Admitting: Internal Medicine

## 2011-07-06 ENCOUNTER — Other Ambulatory Visit: Payer: BC Managed Care – PPO | Admitting: Internal Medicine

## 2011-07-06 DIAGNOSIS — Z0000 Encounter for general adult medical examination without abnormal findings: Secondary | ICD-10-CM

## 2011-07-06 LAB — COMPREHENSIVE METABOLIC PANEL
Albumin: 4.4 g/dL (ref 3.5–5.2)
BUN: 20 mg/dL (ref 6–23)
CO2: 30 mEq/L (ref 19–32)
Glucose, Bld: 95 mg/dL (ref 70–99)
Potassium: 4.1 mEq/L (ref 3.5–5.3)
Sodium: 141 mEq/L (ref 135–145)
Total Bilirubin: 0.5 mg/dL (ref 0.3–1.2)
Total Protein: 6.8 g/dL (ref 6.0–8.3)

## 2011-07-06 LAB — CBC WITH DIFFERENTIAL/PLATELET
Eosinophils Absolute: 0.1 10*3/uL (ref 0.0–0.7)
HCT: 40.5 % (ref 36.0–46.0)
Hemoglobin: 13.7 g/dL (ref 12.0–15.0)
Lymphs Abs: 1.7 10*3/uL (ref 0.7–4.0)
MCH: 30.5 pg (ref 26.0–34.0)
Monocytes Absolute: 0.6 10*3/uL (ref 0.1–1.0)
Monocytes Relative: 11 % (ref 3–12)
Neutrophils Relative %: 54 % (ref 43–77)
RBC: 4.49 MIL/uL (ref 3.87–5.11)

## 2011-07-06 LAB — LIPID PANEL
Cholesterol: 240 mg/dL — ABNORMAL HIGH (ref 0–200)
HDL: 74 mg/dL (ref >39)
Triglycerides: 74 mg/dL (ref <150)

## 2011-07-07 ENCOUNTER — Encounter: Payer: Self-pay | Admitting: Internal Medicine

## 2011-07-07 ENCOUNTER — Ambulatory Visit (INDEPENDENT_AMBULATORY_CARE_PROVIDER_SITE_OTHER): Payer: BC Managed Care – PPO | Admitting: Internal Medicine

## 2011-07-07 VITALS — BP 129/88 | HR 70 | Temp 97.6°F | Ht 66.0 in | Wt 178.0 lb

## 2011-07-07 DIAGNOSIS — Z8543 Personal history of malignant neoplasm of ovary: Secondary | ICD-10-CM

## 2011-07-07 DIAGNOSIS — Z23 Encounter for immunization: Secondary | ICD-10-CM

## 2011-07-07 DIAGNOSIS — Z0000 Encounter for general adult medical examination without abnormal findings: Secondary | ICD-10-CM

## 2011-07-07 LAB — POCT URINALYSIS DIPSTICK
Bilirubin, UA: NEGATIVE
Ketones, UA: NEGATIVE
Leukocytes, UA: NEGATIVE

## 2011-08-01 IMAGING — CT CT ABD-PELV W/ CM
2 of 6 series · 14 of 46 positions shown, 16 images · IV contrast (agent unspecified)
Comparison: 11/11/2009; 11/29/2009

Addendum Begins

The original report was by Dr. Paulus N Ceejay.  The following
addendum is by Dr. Paulus N Ceejay:
Dr. [REDACTED] contacted Vick at [HOSPITAL], who
contacted Naqia at [HOSPITAL], who notified me that
Dr. [REDACTED] wanted re-measurement of the low density
paratracheal structure noted in the CT the chest.  I called to
speak to Dr. Filipmichaela Calta but she is not available today.  I spoke with
her nurse and initially we arranged but I would speak with Dr.
Vick on [REDACTED], [DATE] in order to understand what she
wants re-measured.  However, there are apparently continuing phone
calls to issue an addendum before [REDACTED] [DATE], and thus
addendum follows:
The vertical measurement of the low density region in the
mediastinum of 4.8 cm is taken on image 47 of series 602.  The
transverse measurement of this structure of 3.1 cm is performed on
image 14 of series 2.  The anterior - posterior measurement of
cm is also obtained on image 14 of series 2.  These measurements
matched the initial report measurements.  I have saved a
presentation state in PACS named "node measurements" that contains
the relevant measurements on the images.
The low density nature of this structure makes the possibility of a
superior pericardial recess instead of low density adenopathy
possible.  Further inferiorly, the possibility of superior
pericardial recess is plausible, but the superior extension and
rounded appearance of the upper margin would be atypical for
superior pericardial recess, and thus this was thought to likely
represent a low density nodal structure.  Many of the surrounding
lymph nodes are of a higher density.  Presumably other mediastinal
cystic lesions such as foregut duplication cyst could have a
similar appearance; adenopathy was favored at the original report
due to the presence of other adenopathy in the chest.
It is not a problem to issue further addendums once I am able to
discuss the particular measurement issue with Dr. Judesys and
determine what she would like measured.
Addendum Ends
CLINICAL DATA: Ovarian cancer.
CT CHEST, ABDOMEN AND PELVIS WITH CONTRAST
TECHNIQUE: Multidetector CT imaging of the chest, abdomen and
pelvis was performed following the standard protocol during bolus
administration of intravenous contrast.
Contrast: 100 ml [id number]-[id number]

[Series 2: cap with st · axial · 0.77mm/px · z∈[-603,-68]mm · 11 of 125 slices shown, 13 images]
[im 9/125  soft-tissue]
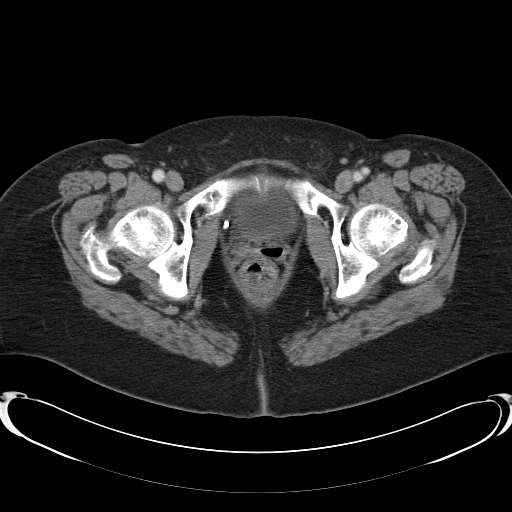
[im 9/125  bone]
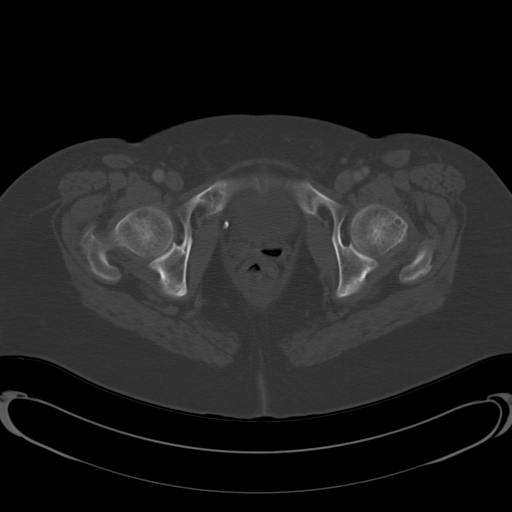
[im 17/125  soft-tissue]
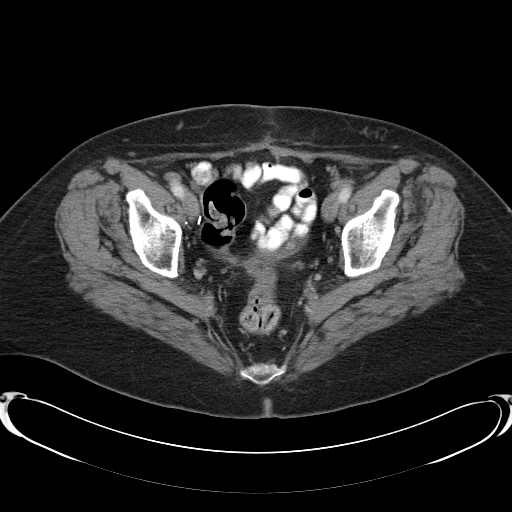
[im 34/125  soft-tissue]
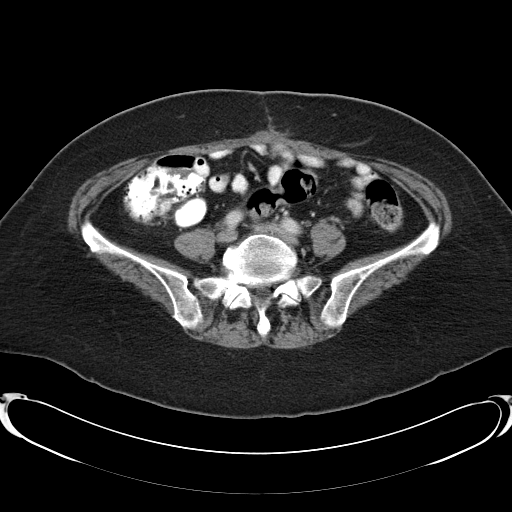
[im 42/125  soft-tissue]
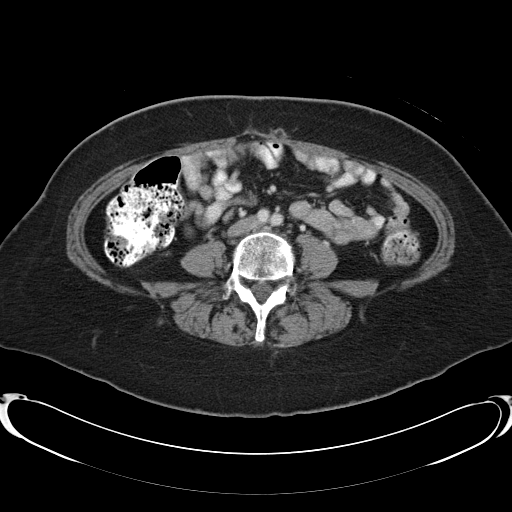
[im 50/125  soft-tissue]
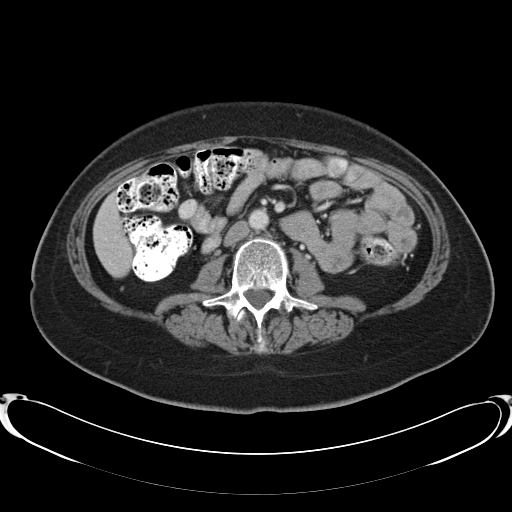
[im 67/125  soft-tissue]
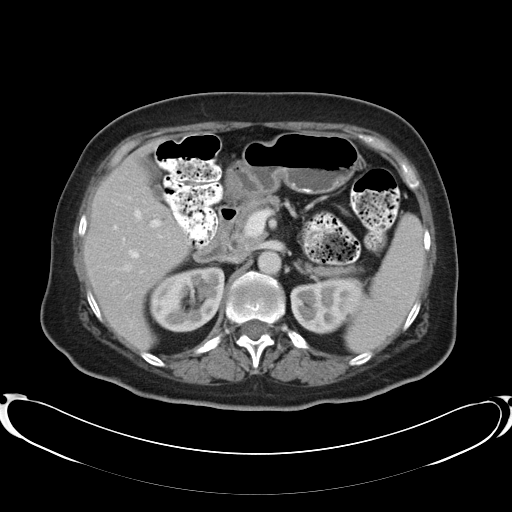
[im 75/125  soft-tissue]
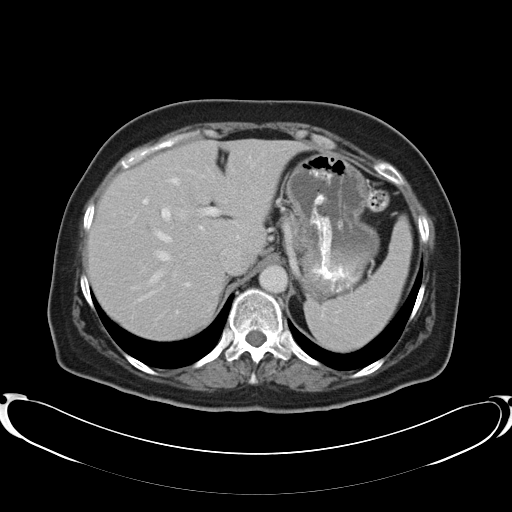
[im 83/125  soft-tissue]
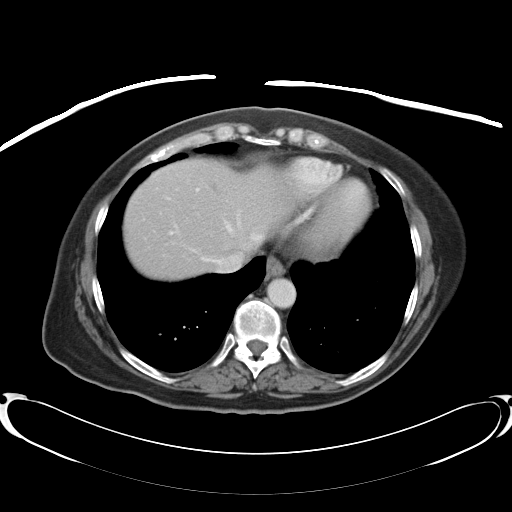
[im 91/125  soft-tissue]
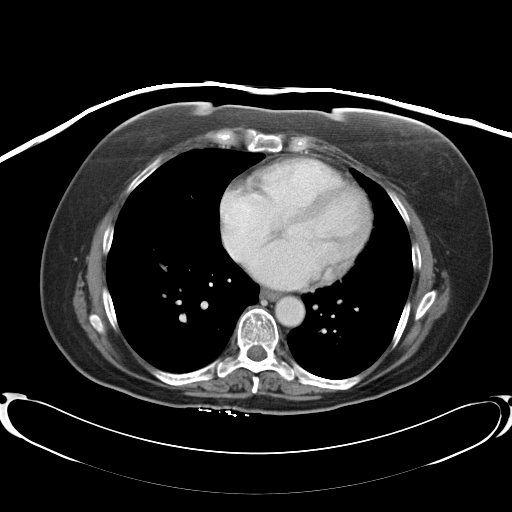
[im 91/125  bone]
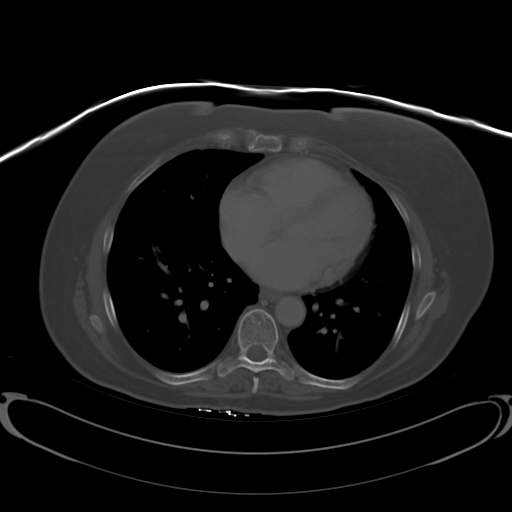
[im 108/125  soft-tissue]
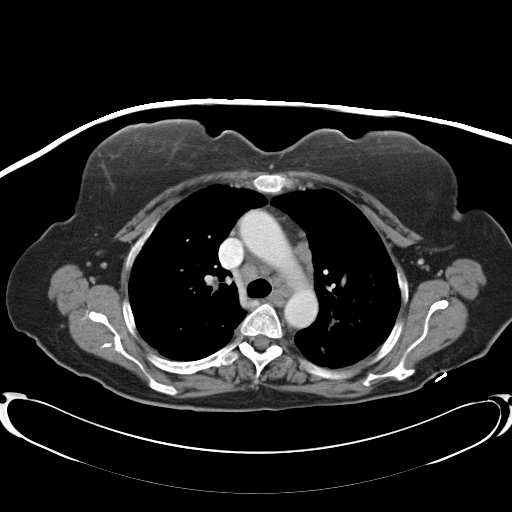
[im 116/125  soft-tissue]
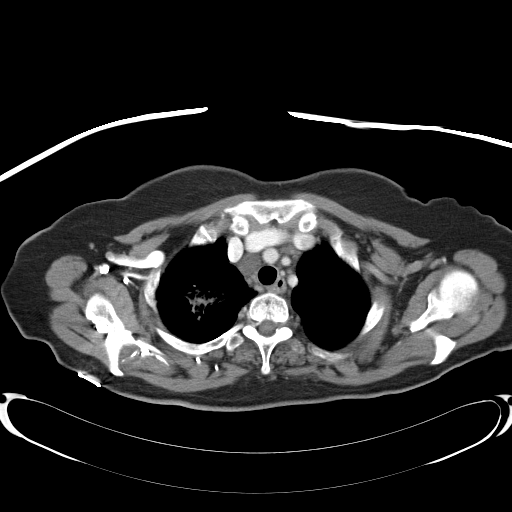

[Series 602: <mpr thick range> · coronal · 1.22mm/px · 3 of 87 slices shown]
[im 29/87  soft-tissue]
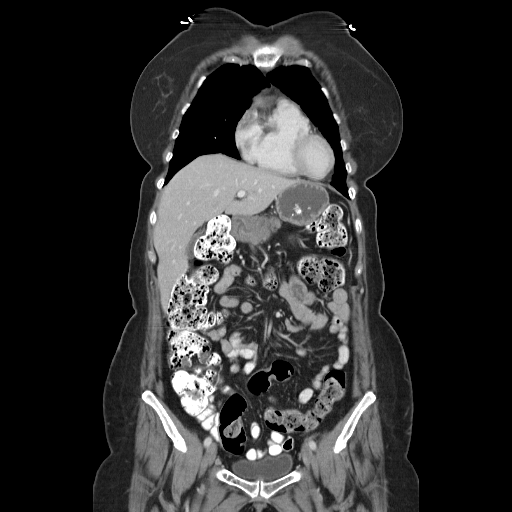
[im 39/87  soft-tissue]
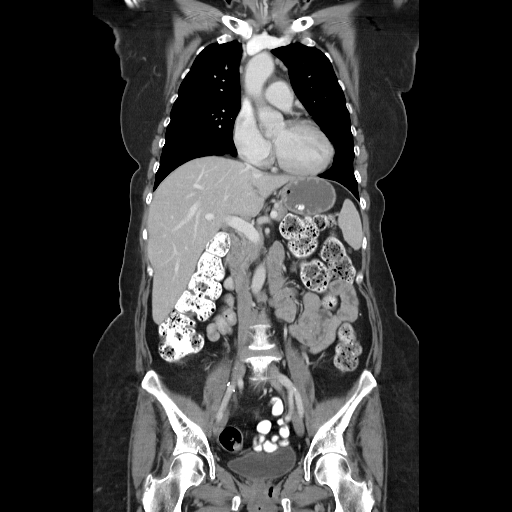
[im 48/87  soft-tissue]
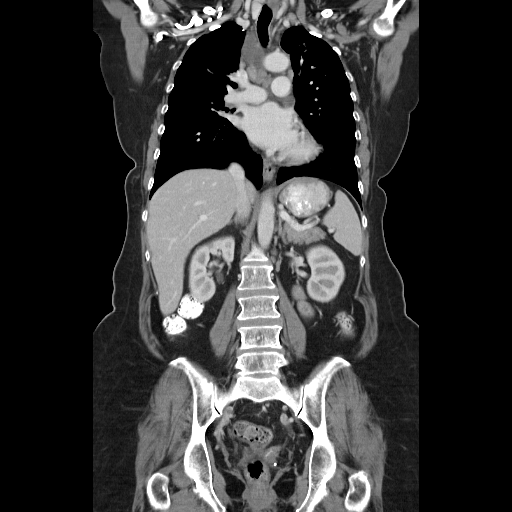

[14 of 46 positions shown; findings below may reference images not displayed]

FINDINGS: A low density paratracheal node measures 4.8 by 3.1 x
cm.  Conglomerate AP window lymph nodes measure 2.2 x 1.0 cm.
Conglomerate precarinal nodes measure 2.5 x 1.9 cm.  A right hilar
node measures 1.4 x 1.7 cm and a subcarinal node measures 1.3 cm in
short axis dimension.  A posterior right paratracheal node measures
1.9 x 1.4 cm on image 12 of series 2.

Very small thyroid nodules are noted.  A prevascular node on image
14 of series 2 measures 1.5 x 0.8 cm.

A left supraclavicular node measures 1.7 x 0.9 cm on image 6 of
series 2.

Bilateral upper lobe patchy volume loss and airspace opacity noted
suggesting scarring.  This is present along with some vague
nodularity to a lesser extent in the lower lobes.  By report these
have also been present back on radiographs back through 0114.
IMPRESSION: 1.  Mediastinal and hilar adenopathy along with mild left
supraclavicular adenopathy. This could be related to malignancy or
the patient's underlying lung parenchymal disease.
2.  Bilateral airspace opacities with volume loss, especially
centrally in the upper lobes and to a lesser extent in the lower
lobes and right middle lobe.  Differential diagnostic
considerations may include sarcoidosis (which also might cause
adenopathy), residua from tuberculosis, silicosis, or residual
scarring from Pneumocystis pneumonia.  Eosinophilic granuloma of
the lung seems less likely given the lack of cystic spaces in the
lungs, and cystic fibrosis is not compatible with the patient's age
or history.  The apparent stability of the lung findings is
reassuring against malignancy, although the presence of the patchy
opacities does reduce sensitivity for detecting small new
malignancies, and surveillance CT imaging the lungs may be
indicated.

CT ABDOMEN AND PELVIS
FINDINGS: A hypodense lesion in the medial segment left hepatic
lobe measures 0.5 x 0.7 cm on image 44 of series 2.  This lesion is
technically nonspecific due to small size.

The spleen, pancreas, and adrenal glands appear unremarkable.  No
significant renal abnormality identified.

Small periaortic lymph nodes are present.  A left periaortic node
measuring 1.3 x 0.7 cm is shown on image 73 of series 2.  A
periaortic node with short axis diameter of 8 mm is shown on image
67 of series 2.

A left external iliac node on image 108 of series 2 measures 2.3 x
1.1 cm, mildly enlarged.  Small central mesenteric lymph nodes are
present in the upper pelvis.

The urinary bladder appears unremarkable.  Uterus and ovaries are
absent.

There is mild soft tissue asymmetry to the left side at the level
of the introitus, possibly due to a Bartholin or Judesys
cyst.
IMPRESSION: 1.  Mildly enlarged left external iliac node, which at a minimum
requires observation.
2.  Small hypodense lesion in the medial segment left hepatic lobe,
technically nonspecific due to small size, but potentially
narrowing observation or imaging.
3.  Soft tissue asymmetry to the left side in the vicinity of the
introitus, statistically most likely to be due to a Bartholin or
Judesys cyst.

## 2011-08-02 ENCOUNTER — Encounter: Payer: Self-pay | Admitting: Internal Medicine

## 2011-08-02 NOTE — Progress Notes (Signed)
Subjective:    Patient ID: Julia Solis, female    DOB: 1951-02-07, 60 y.o.   MRN: [id number]  HPI pleasant 60 year old white female referred by Dr. Darrold Span her primary care. History of ovarian cancer I-C grade 3 clear cell post debulking surgery March 2011 by Dr. Nelly Rout and is status post chemotherapy. Was found to have large mass posterior to uterus extending from right adnexa measuring 12 x 10 x 10.9 cm. She had total of abnormal hysterectomy, BSO, bilateral pelvic node dissection, right. Aortic node sampling, omentectomy, appendectomy and biopsies. Lymph nodes were all negative for malignancy. Symptoms that started February 2011 is abdominal gas and bloating. Preoperative chest x-ray was abnormal and she subsequently had CT of the chest. Apparently she has tiny pulmonary nodules thought to be benign. This is being watched by oncologist.  Had colonoscopy September 2012 by Dr. Danise Edge. Pneumovax given 2011  Has allergy to latex. BRCA gene negative. 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  Most recent CT of chest August 2012 shows left upper lobe linear thickening with no significant change from previous CT scan July 2011, left lower lobe pulmonary nodule adjacent to the pleura measures 6 mm and has not changed. No new pulmonary lesions. Has right paratracheal low density without significant change. This may represent an elongated pericardial recess. CT of abdomen and pelvis is unchanged with no evidence of metastatic disease. Has stable small iliac lymph nodes.  History of lumbar disc surgery by Dr. Rosalie Doctor 1997. Had some residual numbness in left foot after that. Some labile hypertension. Ectopic pregnancy 1980. Left breast benign excisional biopsy 1993. C-sections 1992 and 1985. Ganglion cyst removed from rest 1975. Tonsillectomy 1957. Had a brow lift approximately 2008. Had a fall in September 2010  injuring right knee.  Takes stool softener for chronic constipation. Takes vitamin D and calcium supplement, generic Zyrtec daily for allergy symptoms, multivitamin daily and when necessary ibuprofen. Patient does not smoke but did from ages [age]. Social alcohol consumption. 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 Is divorced. 2 adult daughters.  Father died at age 66 with prostate cancer  Mother living at age 48  Brother died at age 18 from an AVM rupture. One sister in good health.    Review of Systems  Constitutional: Positive for fatigue.  HENT: Negative.   Eyes: Negative.   Respiratory: Negative.   Cardiovascular: Negative.   Gastrointestinal:       Chronic constipation  Genitourinary: Negative.   Musculoskeletal:       Low back pain, knee pain  Neurological:       None this in hands and feet secondary to chemotherapy, mild hearing difficulty  Hematological: Negative.   Psychiatric/Behavioral: Negative.        Objective:   Physical Exam  Vitals reviewed. Constitutional: She is oriented to person, place, and time. She appears well-developed and well-nourished.  HENT:  Head: Normocephalic and atraumatic.  Right Ear: External ear normal.  Left Ear: External ear normal.  Mouth/Throat: Oropharynx is clear and moist.  Eyes: EOM are normal. Pupils are equal, round, and reactive to light.  Neck: Neck supple. No JVD present. No thyromegaly present.  Cardiovascular: Normal rate, regular rhythm and normal heart sounds.   No murmur heard. Pulmonary/Chest: Effort normal and breath sounds normal. She has no rales.  Abdominal: Soft. Bowel sounds are normal. She exhibits no mass. There is no tenderness.  Genitourinary:  Deferred to GYN  Musculoskeletal: She exhibits no edema.  Lymphadenopathy:    She has no cervical adenopathy.  Neurological: She is alert and oriented to person, place, and time. She has normal reflexes. No cranial nerve deficit. Coordination  normal.  Skin: Skin is warm and dry.  Psychiatric: She has a normal mood and affect. Her behavior is normal. Judgment and thought content normal.          Assessment & Plan:  History of I-C ovarian clear cell carcinoma status post chemotherapy and debulking surgery in 2011.  Recommend annual influenza immunization. Recommend annual mammogram. Followup per Dr. love is a. Should get bone density study if not had one recently. Return one year or as needed.

## 2011-08-05 ENCOUNTER — Encounter: Payer: Self-pay | Admitting: Internal Medicine

## 2011-08-05 NOTE — Patient Instructions (Signed)
Please obtain annual mammograms. Please get bone density study. Continue followup with Dr. Darrold Span and Dr. Nelly Rout regarding ovarian cancer. Recommend annual influenza immunization.

## 2011-08-17 ENCOUNTER — Encounter: Payer: Self-pay | Admitting: Internal Medicine

## 2011-08-17 DIAGNOSIS — R5383 Other fatigue: Secondary | ICD-10-CM | POA: Insufficient documentation

## 2011-10-02 ENCOUNTER — Other Ambulatory Visit: Payer: Self-pay | Admitting: Oncology

## 2011-10-02 ENCOUNTER — Telehealth: Payer: Self-pay | Admitting: Oncology

## 2011-10-02 ENCOUNTER — Other Ambulatory Visit (HOSPITAL_BASED_OUTPATIENT_CLINIC_OR_DEPARTMENT_OTHER): Payer: BC Managed Care – PPO | Admitting: Lab

## 2011-10-02 ENCOUNTER — Ambulatory Visit (HOSPITAL_BASED_OUTPATIENT_CLINIC_OR_DEPARTMENT_OTHER): Payer: BC Managed Care – PPO | Admitting: Oncology

## 2011-10-02 VITALS — BP 138/85 | HR 64 | Temp 97.1°F | Ht 65.0 in | Wt 179.6 lb

## 2011-10-02 DIAGNOSIS — C569 Malignant neoplasm of unspecified ovary: Secondary | ICD-10-CM

## 2011-10-02 DIAGNOSIS — R911 Solitary pulmonary nodule: Secondary | ICD-10-CM

## 2011-10-02 LAB — COMPREHENSIVE METABOLIC PANEL
ALT: 12 U/L (ref 0–35)
AST: 18 U/L (ref 0–37)
CO2: 31 mEq/L (ref 19–32)
Calcium: 9.8 mg/dL (ref 8.4–10.5)
Chloride: 99 mEq/L (ref 96–112)
Sodium: 138 mEq/L (ref 135–145)
Total Bilirubin: 0.4 mg/dL (ref 0.3–1.2)
Total Protein: 7 g/dL (ref 6.0–8.3)

## 2011-10-02 LAB — CBC WITH DIFFERENTIAL/PLATELET
Basophils Absolute: 0 10*3/uL (ref 0.0–0.1)
Eosinophils Absolute: 0.1 10*3/uL (ref 0.0–0.5)
HCT: 37.8 % (ref 34.8–46.6)
HGB: 13 g/dL (ref 11.6–15.9)
MONO#: 0.4 10*3/uL (ref 0.1–0.9)
NEUT%: 53.9 % (ref 38.4–76.8)
WBC: 5.2 10*3/uL (ref 3.9–10.3)
lymph#: 1.8 10*3/uL (ref 0.9–3.3)

## 2011-10-02 LAB — CA 125: CA 125: 9.2 U/mL (ref 0.0–30.2)

## 2011-10-02 NOTE — Telephone Encounter (Signed)
Per nancy w. The 06/2012 appts are not open yet for dr Nelly Rout.  They will be avail in may.  The pt has made a note in her phone tho call for that appt and pt is also calling Dr Nita Sells office herself for the same reason.    aom  I have kept a copy of this for dr brewsters appt   aom

## 2011-10-03 NOTE — Progress Notes (Signed)
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 Last CT CAP was in [facility] 06-04-2011 , this conpared with CT of 05-2010 showed stable findings in chest including the small pulmonary nodules and areas of thickening, a stable 1 cm iliac node and no evidence of metastatic disease. She saw Dr. Nelly Rout in Sept, is to see Dr.Richard Arlyce Dice in March (patient to set up that appointment as his books do not open until Jan), then understands that she is to see Dr.Brewster again 6 months after Dr.Kaplan's appointment. Ms.Kintzel has established with Dr.Baxley as primary, will have cholesterol repeated in 6 months; she has had flu vaccine. She had colonoscopy by Dr.Martin Laural Benes with no findings of concern, repeat recommended in 10 years. 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 She has had no recent infectious illness, no abdominal or pelvic discomfort, no respiratory symptoms, no bleeding. She had unremarkable mammograms at [facility].  Remainder of full 10 point Review of Systems negative.   Objective:  Vital signs in last 24 hours:  BP 138/85  Pulse 64  Temp(Src) 97.1 F (36.2 C) (Oral)  Ht 5\' 5"  (1.651 m)  Wt 179 lb 9.6 oz (81.466 kg)  BMI 29.89 kg/m2  Alert, appears comfortable, easily mobile, excellent historian as always.  HEENT:mucous membranes moist, pharynx normal without lesions. PERRL. LymphaticsCervical, supraclavicular, and axillary nodes normal. Resp: clear to auscultation bilaterally and normal percussion bilaterally Cardio:  regular rate and rhythm GI: soft and nontender, not distended, normal bowel sounds, no HSM or masses, no inguinal adenopathy Extremities: extremities normal, atraumatic, no cyanosis or edema Breasts bilaterally without masses, skin changes, nipple discharge and axillae not remarkable   Lab Results:   Basename 10/02/11 1026  WBC 5.2  HGB 13.0  HCT 37.8  PLT 183  ANC 2.8  BMET/CMET  Basename 10/02/11 1026  NA 138138  K 4.24.2  CL 9999  CO2 3131  GLUCOSE 9191  BUN 2121  CREATININE 0.690.69  CALCIUM 9.89.8  Remainder of full CMET normal CA 125  9.2, essentially stable from 8.6 in Aug 2012.   Studies/Results:  CT Chest,Abd,Pelvis Aug 2012 as above Mammograms as above  Medications: I have reviewed the patient's current medications.  Assessment/Plan:  1. IC clear cell ovarian carcinoma post surgery March 2011 and adjuvant chemotherapy thru July 2011: clinically doing well. With follow up alternating Dr.Kaplan and Dr.Brewster and as she is established with primary physician, I will change follow up at this office to prn. She understands that I am glad to see her at any time if she or her other physicians feel that I can be of help. She has had CA 125 drawn at [facility] office previously, so we expect that will be done with his March appointment; I have ordered CA 125 at [facility] in early Sept with copies to Dr.Brewster and Dr.Kaplan. Patient will need to contact gyn onc closer to Sept to set up the visit with Dr.Brewster 2.Stable small bilateral pulmonary nodules by CT 3. Up to date on colonoscopy and mammograms  Patient is comfortable with our discussion and with  this plan.        LIVESAY,LENNIS P, MD   10/03/2011, 7:25 PM

## 2011-10-05 ENCOUNTER — Telehealth: Payer: Self-pay

## 2011-10-05 NOTE — Telephone Encounter (Signed)
SPOKE WITH MS. Peraza AND TOLD HER THAT HER CHEMISTRIES AND CA 125 WERE ALL GOOD FROM APPT. 10-02-11 PER DR. Darrold Span.

## 2011-12-29 ENCOUNTER — Other Ambulatory Visit (INDEPENDENT_AMBULATORY_CARE_PROVIDER_SITE_OTHER): Payer: BC Managed Care – PPO | Admitting: Internal Medicine

## 2011-12-29 DIAGNOSIS — Z79899 Other long term (current) drug therapy: Secondary | ICD-10-CM

## 2011-12-29 DIAGNOSIS — E785 Hyperlipidemia, unspecified: Secondary | ICD-10-CM

## 2011-12-29 DIAGNOSIS — C569 Malignant neoplasm of unspecified ovary: Secondary | ICD-10-CM

## 2011-12-29 LAB — LIPID PANEL
HDL: 79 mg/dL (ref >39)
LDL Cholesterol: 139 mg/dL — ABNORMAL HIGH (ref 0–99)
Total CHOL/HDL Ratio: 2.9 Ratio

## 2011-12-29 LAB — CA 125: CA 125: 10.1 U/mL (ref 0.0–30.2)

## 2011-12-29 LAB — TSH: TSH: 2.582 u[IU]/mL (ref 0.350–4.500)

## 2011-12-31 ENCOUNTER — Encounter: Payer: Self-pay | Admitting: Internal Medicine

## 2011-12-31 ENCOUNTER — Ambulatory Visit (INDEPENDENT_AMBULATORY_CARE_PROVIDER_SITE_OTHER): Payer: BC Managed Care – PPO | Admitting: Internal Medicine

## 2011-12-31 VITALS — BP 124/84 | HR 76 | Temp 98.5°F | Wt 180.0 lb

## 2011-12-31 DIAGNOSIS — Z8543 Personal history of malignant neoplasm of ovary: Secondary | ICD-10-CM

## 2011-12-31 DIAGNOSIS — E785 Hyperlipidemia, unspecified: Secondary | ICD-10-CM

## 2011-12-31 NOTE — Patient Instructions (Addendum)
Try to watch diet and exercise. We will recheck lipid panel when you return for physical exam in 6 months.

## 2011-12-31 NOTE — Progress Notes (Signed)
  Subjective:    Patient ID: Julia Solis, female    DOB: 06/28/1951, 61 y.o.   MRN: [id number]  HPI 61 year old white female with history of ovarian cancer status post treatment doing well for followup 6 months after initial visit here. Patient had elevated LDL cholesterol and total cholesterol at last visit. Please see lab work. Patient did not want to go lipid-lowering medication. Patient says she is "fairly well". Does like cheese. Reviewed with her foods that contain cholesterol. Lab work has improved a bit in terms of better LDL cholesterol. However still in the moderately elevated range. Patient still does not want to go lipid-lowering medication.     Review of Systems     Objective:   Physical ExamPatient not examined. Spent 15 minutes going over previous lab work with patient from Dr. Raquel James in 2011. This was prior to her chemotherapy treatment. At that time she had an LDL cholesterol of 215. TSH which was slightly over 3 at last visit was rechecked recently and now is slightly over 2.        Assessment & Plan:  Hyperlipidemia  Hyperlipidemia Hyperlipidemia  Hyperlipidemia with moderately elevated LDL cholesterol. Has improved a bit the past 6 months.  History of ovarian cancer-says Dr. Darrold Span has released her from treatment and care but she still has to see GYN oncologist in followup of ovarian cancer.  Plan: Patient does not want to be a lipid-lowering medication. She agrees to return in 6 months for physical examination. She really doesn't like going to doctors and doesn't like taking medication. It is clear that the treatment for ovarian cancer was very frightening and upsetting to her. Understand this but would like to keep a close eye on her every 6 months. Would like to see her every 6 months for now.

## 2012-01-07 ENCOUNTER — Other Ambulatory Visit: Payer: BC Managed Care – PPO | Admitting: Internal Medicine

## 2012-01-08 ENCOUNTER — Ambulatory Visit: Payer: BC Managed Care – PPO | Admitting: Internal Medicine

## 2012-06-30 ENCOUNTER — Other Ambulatory Visit (HOSPITAL_COMMUNITY)
Admission: RE | Admit: 2012-06-30 | Discharge: 2012-06-30 | Disposition: A | Discharge status: Home / Self Care | Payer: BC Managed Care – PPO | Source: Ambulatory Visit | Attending: Gynecologic Oncology | Admitting: Gynecologic Oncology

## 2012-06-30 ENCOUNTER — Ambulatory Visit: Payer: BC Managed Care – PPO | Attending: Gynecologic Oncology | Admitting: Gynecologic Oncology

## 2012-06-30 ENCOUNTER — Encounter: Payer: Self-pay | Admitting: Gynecologic Oncology

## 2012-06-30 VITALS — BP 120/80 | Temp 98.0°F | Ht 64.84 in | Wt 179.0 lb

## 2012-06-30 DIAGNOSIS — Z01419 Encounter for gynecological examination (general) (routine) without abnormal findings: Secondary | ICD-10-CM | POA: Insufficient documentation

## 2012-06-30 DIAGNOSIS — C569 Malignant neoplasm of unspecified ovary: Secondary | ICD-10-CM

## 2012-06-30 NOTE — Progress Notes (Signed)
Please refer patient to Dr. Dareen Piano to see if IV treatment for osteoporosis is indicated.

## 2012-06-30 NOTE — Patient Instructions (Addendum)
Julia Solis remains without any evidence of disease since July 2011. Status post treatment with optimal surgical staging and debulking followed by adjuvant Taxol and carboplatin therapy.  Follow-up q four months for one year then every 6 months.   F/U Gynecologic Oncology in January 2014 Counselled regarding signs/symptoms of recurrent disease. Imaging and biomarkers based on clinical findings.    F/U Dr. Arlyce Dice as scheduled  Management of bone density per PCP   Thank you very much Julia Solis for allowing me to provide care for you today.  I appreciate your confidence in choosing our Gynecologic Oncology team.  If you have any questions about your visit today please call our office and we will get back to you as soon as possible.  [name]. Meli Faley MD., PhD Gynecologic Oncology

## 2012-06-30 NOTE — Progress Notes (Signed)
OFFICE VISIT  REASON FOR VISIT: Ovarian cancer surveillance.   HISTORY OF PRESENT ILLNESS: This is a 61 year old with stage IC ovarian cancer, clear cell, optimally debulked in March 2011. She subsequently received 6 cycles of Taxol and carboplatin therapy, which is completed in July 2011. No adjuvant therapy was recommended after completion of chemotherapy. At the time of initial presentation, [name] was noted to have pulmonary nodules, they have been sequentially followed and remained without any change in size.   No evidence of disease since completion of chemo.  PAST MEDICAL HISTORY:  1. Hypertension diagnosed in 2011.  2. Herniated disk in 1997.  3. Eczema.  4. Stage IC clear cell ovarian cancer.  5.  Low bone density  PAST SURGICAL HISTORY: Left salpingectomy for ectopic pregnancy in 1980s, unspecified back surgery in 1997, left breast lumpectomy in 1993,  total abdominal hysterectomy and bilateral salpingo-oophorectomy, pelvic and periaortic lymph node dissection, and omentectomy in 2012.   SOCIAL HISTORY: Julia Solis is of Jewish ancestry, genetic testing is negative for known mutations. Mother celebrated her 90th birthday. Patient is no longer attending the support group because it made her very anxious.  FAMILY HISTORY: Several paternal aunts and cousins with breast cancer ranging from age of 74-70.   REVIEW OF SYSTEMS:  No headache, cough, or shortness of breath. 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 Daily constipation, requires use of a stool softener. No rectal bleeding. No  hematuria, or hematocolpos. No swelling of the lower extremities. No abdominal pain, nausea, vomiting,no change in appetite. Otherwise, 10- system review is negative.   PHYSICAL EXAMINATION: GENERAL: Well-developed female in no acute distress.  VITAL SIGNS: BP 120/80  Temp 98 F (36.7 C) (Oral)  Ht 5' 4.84" (1.647  m)  Wt 179 lb (81.194 kg)  BMI 29.93 kg/m2 CHEST: Clear to auscultation.  HEART: Regular rate and rhythm.  ABDOMEN: Soft, obese, and nontender. Midline incision evident without any hernia. No palpable masses or fluid wave.  PELVIC EXAMINATION: Normal external genitalia, Bartholin's, urethral, and Skene's. Very atrophic vagina without any palpable masses. No lesions of vaginal cuff.  RECTAL EXAMINATION: Good anal sphincter tone without any masses. No rectovaginal septum nodularity.  EXTREMITIES: No clubbing, cyanosis, or edema.   IMPRESSION: [name] remains without any evidence of disease since July 2011. Status post treatment with optimal surgical staging and debulking followed by adjuvant Taxol and carboplatin therapy.  Follow-up q four months for one year then every 6 months.   F/U Gynecologic Oncology in January 2014 Counselled regarding signs/symptoms of recurrent disease. Imaging and biomarkers based on clinical findings.   F/U Dr. Arlyce Dice as scheduled  Management of bone density per PCP

## 2012-07-05 ENCOUNTER — Other Ambulatory Visit: Payer: BC Managed Care – PPO | Admitting: Internal Medicine

## 2012-07-05 DIAGNOSIS — E785 Hyperlipidemia, unspecified: Secondary | ICD-10-CM

## 2012-07-05 DIAGNOSIS — Z79899 Other long term (current) drug therapy: Secondary | ICD-10-CM

## 2012-07-05 DIAGNOSIS — I10 Essential (primary) hypertension: Secondary | ICD-10-CM

## 2012-07-05 LAB — LIPID PANEL
LDL Cholesterol: 130 mg/dL — ABNORMAL HIGH (ref 0–99)
Triglycerides: 57 mg/dL (ref <150)
VLDL: 11 mg/dL (ref 0–40)

## 2012-07-05 LAB — CBC WITH DIFFERENTIAL/PLATELET
Basophils Absolute: 0 10*3/uL (ref 0.0–0.1)
Basophils Relative: 0 % (ref 0–1)
Eosinophils Absolute: 0.2 10*3/uL (ref 0.0–0.7)
Hemoglobin: 13 g/dL (ref 12.0–15.0)
MCH: 29.7 pg (ref 26.0–34.0)
MCHC: 34 g/dL (ref 30.0–36.0)
Monocytes Relative: 8 % (ref 3–12)
Neutro Abs: 2.4 10*3/uL (ref 1.7–7.7)
Neutrophils Relative %: 49 % (ref 43–77)
Platelets: 215 10*3/uL (ref 150–400)
RDW: 13.3 % (ref 11.5–15.5)

## 2012-07-05 LAB — TSH: TSH: 2.044 u[IU]/mL (ref 0.350–4.500)

## 2012-07-05 LAB — COMPREHENSIVE METABOLIC PANEL
ALT: 10 U/L (ref 0–35)
AST: 16 U/L (ref 0–37)
Albumin: 3.9 g/dL (ref 3.5–5.2)
Alkaline Phosphatase: 82 U/L (ref 39–117)
Glucose, Bld: 86 mg/dL (ref 70–99)
Potassium: 4.1 mEq/L (ref 3.5–5.3)
Sodium: 141 mEq/L (ref 135–145)
Total Bilirubin: 0.5 mg/dL (ref 0.3–1.2)
Total Protein: 6.6 g/dL (ref 6.0–8.3)

## 2012-07-07 ENCOUNTER — Encounter: Payer: Self-pay | Admitting: Internal Medicine

## 2012-07-07 ENCOUNTER — Ambulatory Visit (INDEPENDENT_AMBULATORY_CARE_PROVIDER_SITE_OTHER): Payer: BC Managed Care – PPO | Admitting: Internal Medicine

## 2012-07-07 VITALS — BP 144/76 | HR 76 | Temp 98.0°F | Ht 64.5 in | Wt 181.0 lb

## 2012-07-07 DIAGNOSIS — Z0000 Encounter for general adult medical examination without abnormal findings: Secondary | ICD-10-CM

## 2012-07-07 DIAGNOSIS — Z23 Encounter for immunization: Secondary | ICD-10-CM

## 2012-07-07 DIAGNOSIS — M949 Disorder of cartilage, unspecified: Secondary | ICD-10-CM

## 2012-07-07 DIAGNOSIS — Z8543 Personal history of malignant neoplasm of ovary: Secondary | ICD-10-CM

## 2012-07-07 DIAGNOSIS — I10 Essential (primary) hypertension: Secondary | ICD-10-CM

## 2012-07-07 DIAGNOSIS — M25569 Pain in unspecified knee: Secondary | ICD-10-CM

## 2012-07-07 DIAGNOSIS — M25561 Pain in right knee: Secondary | ICD-10-CM | POA: Insufficient documentation

## 2012-07-07 DIAGNOSIS — E785 Hyperlipidemia, unspecified: Secondary | ICD-10-CM

## 2012-07-07 DIAGNOSIS — M8580 Other specified disorders of bone density and structure, unspecified site: Secondary | ICD-10-CM | POA: Insufficient documentation

## 2012-07-07 LAB — POCT URINALYSIS DIPSTICK
Leukocytes, UA: NEGATIVE
Protein, UA: NEGATIVE
Urobilinogen, UA: NEGATIVE

## 2012-07-07 NOTE — Progress Notes (Signed)
Subjective:    Patient ID: Julia Solis, female    DOB: [birth year], 61 y.o.   MRN: [id number]  HPI 61 year old white female with history of ovarian cancer status post debulking surgery March 2011 by Dr. Nelly Rout. Status post chemotherapy. Was found to have a large mass posterior to uterus extending from right adnexa measuring 12 x 10 at 10.9 cm. She had told him we'll hysterectomy, BSO, bilateral pelvic node dissection on the right. Aortic node sampling, omentectomy, appendectomy and biopsies were done. Lymph nodes role negative for malignancy. Her symptoms started in February 2011 is abdominal gas and bloating. Apparently has benign tiny pulmonary nodules thought to be benign on chest CT. Patient tested negative for BRCA gene. Chemotherapy consisted of Taxol and carboplatin. Subsequently developed neuropathy in hands feet and lower legs after chemotherapy.  Had colonoscopy September 2012 by Dr. Danise Edge. Pneumovax given 2011.  Has allergy to Latex  Most recent CT of chest was August 2012 showing left upper lobe linear thickening with no change from previous CT scan July 2011. Left lower lobe pulmonary nodule adjacent to the pleura measured 6 mm and had not changed. Had right paratracheal low-density without significant change. CT of abdomen and pelvis was unchanged and 2012. Had small stable iliac lymph nodes.  History of lumbar disc surgery by Dr. Rosalie Doctor 1997. Had some residual numbness in left foot after that. History of some labile hypertension. Ectopic pregnancy 1980. Left breast benign excisional biopsy 1993. C-sections 1992 and 1985. Ganglion cyst removed 1975. Tonsillectomy 1957. Browlift in 2008. Had a fall September 2010 injuring her right knee.  Used to take Benadryl for insomnia but has been off of that for a week or so and sleeping just fine. History of allergic rhinitis.  Patient does not smoke but did smoke from the age of [age]. Social alcohol consumption. 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 She is divorced and has 2 adult daughters.  Family history: Father died at age 60 with prostate cancer. Mother living. Brother died at age 7 from an AVM rupture. One sister in good health.  Recent bone density study shows improvement in osteopenia from -2.2 in spine -1.9 although they admit that this could be perhaps do to degenerative sclerosis. There is 3.6% improvement apparently since 2011. In any case we're going to continue with calcium and vitamin D supplementation. Will repeat bone density study in 2 years.  Influenza immunization given today.    Review of Systems  Constitutional: Negative.   HENT: Negative.   Eyes: Negative.   Respiratory:       No shortness of breath. No chest pain. No leg swelling.  Gastrointestinal:       Denies abdominal pain or bloating.  Genitourinary:       Continues to see Dr. Nelly Rout for GYN oncology followup on a regular basis.  Musculoskeletal:       Occasional right knee pain secondary to fall 2010  Neurological:       Some residual numbness left foot after back surgery and chemotherapy  Psychiatric/Behavioral:       History of insomnia for which she took Benadryl but has been off of that for about a week now       Objective:   Physical Exam  Vitals reviewed. Constitutional: She is oriented to person, place, and time. She appears well-developed and well-nourished. No distress.  HENT:  Head: Normocephalic and atraumatic.  Right Ear: External ear normal.  Left Ear:  External ear normal.  Mouth/Throat: Oropharynx is clear and moist.  Eyes: Conjunctivae normal and EOM are normal. Pupils are equal, round, and reactive to light. Right eye exhibits no discharge. Left eye exhibits no discharge. No scleral icterus.  Neck: Neck supple. No JVD present. No thyromegaly present.  Cardiovascular: Normal rate, regular rhythm, normal heart sounds and intact distal pulses.   No murmur  heard. Pulmonary/Chest: Effort normal and breath sounds normal. No respiratory distress. She has no wheezes. She has no rales. She exhibits no tenderness.  Abdominal: Soft. Bowel sounds are normal. She exhibits no distension and no mass. There is no tenderness. There is no rebound and no guarding.  Genitourinary: Vagina normal and uterus normal.  Musculoskeletal: Normal range of motion. She exhibits no edema.  Lymphadenopathy:    She has no cervical adenopathy.  Neurological: She is alert and oriented to person, place, and time. She has normal reflexes. She displays normal reflexes. Coordination normal.  Skin: Skin is warm and dry. No rash noted. She is not diaphoretic.  Psychiatric: She has a normal mood and affect. Her behavior is normal. Judgment and thought content normal.          Assessment & Plan:  Osteopenia  Moderate hyperlipidemia with LDL 130. Continue diet and exercise. HDL is good at 72.  History of ovarian cancer  Right knee pain  History of insomnia treated with Benadryl but recently stopped taking Benadryl for sleeping fine for the past week  Plan: Mammogram due in the near future. Order given. Bone density study to be done in 2 years. Influenza immunization given today. Return in one year or as needed.

## 2012-07-07 NOTE — Patient Instructions (Addendum)
Continue calcium and vitamin D supplementation. Bone density study to be done in 2 years. Influenza immunization given today. Return in one year for physical exam.

## 2012-08-26 ENCOUNTER — Telehealth: Payer: Self-pay | Admitting: Internal Medicine

## 2012-08-26 NOTE — Telephone Encounter (Signed)
Patient called this a.m. R/t DOS 07/07/12 (CPE).  Pt states she received an EOB from [employer] r/t 07/07/12 and also 2012-08-23.  Patient was NOT seen on 08-23-2012.  Advised to call the billing office ([employer]).  Patient called back stating that [employer] advised they work denials ONLY and that she needed to work through [place].  Spoke with Bonita Quin and she advised that she went back and put the flu shot in on 2012-08-23 thinking she had not previously charged for it on 07/07/12.  Upon review of the patient account, patient was billed on 07/07/12 for flu shot and [employer] paid for it.  [employer] also paid for Aug 23, 2012.  The issue for the patient is 2 fold:  (1) her 2014 benefit period began 08-23-2012 so it would appear that she had her 2014 flu shot 08-23-12.  (2) incorrect billing.  I spoke with Aundra Millet at [facility] [phone]; Aundra Millet advised that she will send the codes 09811 and 90471 on DOS 23-Aug-2012 to be deleted and notify [employer] that this is incorrect.  They will need to refund the payment back to [facility].  Aundra Millet advised that she will notify the patient once this issue has been resolved.  I then spoke with the patient @ [phone] to advise of this information.  Patient verbalizes understanding and is fine with this answer.

## 2012-10-25 ENCOUNTER — Encounter: Payer: Self-pay | Admitting: Gynecologic Oncology

## 2012-10-25 ENCOUNTER — Ambulatory Visit: Payer: BC Managed Care – PPO | Attending: Gynecologic Oncology | Admitting: Gynecologic Oncology

## 2012-10-25 VITALS — BP 132/80 | HR 64 | Temp 97.4°F | Resp 16 | Ht 64.84 in | Wt 179.6 lb

## 2012-10-25 DIAGNOSIS — Z803 Family history of malignant neoplasm of breast: Secondary | ICD-10-CM | POA: Insufficient documentation

## 2012-10-25 DIAGNOSIS — C569 Malignant neoplasm of unspecified ovary: Secondary | ICD-10-CM | POA: Insufficient documentation

## 2012-10-25 DIAGNOSIS — Z90710 Acquired absence of both cervix and uterus: Secondary | ICD-10-CM | POA: Insufficient documentation

## 2012-10-25 DIAGNOSIS — R918 Other nonspecific abnormal finding of lung field: Secondary | ICD-10-CM | POA: Insufficient documentation

## 2012-10-25 DIAGNOSIS — Z9221 Personal history of antineoplastic chemotherapy: Secondary | ICD-10-CM | POA: Insufficient documentation

## 2012-10-25 DIAGNOSIS — I10 Essential (primary) hypertension: Secondary | ICD-10-CM | POA: Insufficient documentation

## 2012-10-25 DIAGNOSIS — Z9079 Acquired absence of other genital organ(s): Secondary | ICD-10-CM | POA: Insufficient documentation

## 2012-10-26 ENCOUNTER — Encounter: Payer: Self-pay | Admitting: Gynecologic Oncology

## 2012-10-26 NOTE — Addendum Note (Signed)
Addended by: Warner Mccreedy D on: 10/26/2012 03:01 PM   Modules accepted: Level of Service

## 2012-10-26 NOTE — Progress Notes (Signed)
OFFICE VISIT:  GYN ONCOLOGY  REASON FOR VISIT: Ovarian cancer surveillance.   HISTORY OF PRESENT ILLNESS: This is a 62 year old with stage IC ovarian cancer, clear cell, optimally debulked in March 2011. She subsequently received 6 cycles of Taxol and carboplatin therapy, which is completed in July 2011. No adjuvant therapy was recommended after completion of chemotherapy. At the time of initial presentation, Julia Solis was noted to have pulmonary nodules, they have been sequentially followed and remained without any change in size.   INTERVAL HISTORY:  Patient has done well.  Denies bloating, weight loss, change in appetite, or changes in bowel or rectal habits.  PAST MEDICAL HISTORY:  1. Hypertension diagnosed in 2011.  2. Herniated disk in 1997.  3. Eczema.  4. Stage IC clear cell ovarian cancer.  5.  Low bone density  PAST SURGICAL HISTORY: Left salpingectomy for ectopic pregnancy in 1980s, unspecified back surgery in 1997, left breast lumpectomy in 1993,  total abdominal hysterectomy and bilateral salpingo-oophorectomy, pelvic and periaortic lymph node dissection, and omentectomy in 2012.   SOCIAL HISTORY: Julia Solis is of Jewish ancestry, genetic testing is negative for known mutations. Mother celebrated her 90[age] birthday. Patient is no longer attending the support group because it made her very anxious.  FAMILY HISTORY: Several paternal aunts and cousins with breast cancer ranging from age of 66-70.   REVIEW OF SYSTEMS:  No headache, cough, or shortness of breath. 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 Daily constipation, requires use of a stool softener. No rectal bleeding. No  hematuria, or hematocolpos. No swelling of the lower extremities. No abdominal pain, nausea, vomiting,no change in appetite. Otherwise, 10- system review is negative.   PHYSICAL EXAMINATION:  GENERAL: Well-developed female in no acute distress.  VITAL SIGNS: BP 132/80  Pulse 64  Temp 97.4 F (36.3 C) (Oral)  Resp 16  Ht 5' 4.84" (1.647 m)  Wt 179 lb 9.6 oz (81.466 kg)  BMI 30.03 kg/m2 CHEST: Clear to auscultation.  HEART: Regular rate and rhythm.  ABDOMEN: Soft, obese, and nontender. Midline incision evident without any hernia. No palpable masses or fluid wave.  PELVIC EXAMINATION: Normal external genitalia, Bartholin's, urethral, and Skene's. Very atrophic vagina without any palpable masses. No lesions palpable in the vagina or cul de sac. RECTAL EXAMINATION: Good anal sphincter tone without any masses. No rectovaginal septum nodularity.  EXTREMITIES: No clubbing, cyanosis, or edema.   IMPRESSION: Julia Solis remains without any evidence of disease since July 2011. Status post treatment with optimal surgical staging and debulking followed by adjuvant Taxol and carboplatin therapy.  Follow-up q four months until July 2014  then every 6 months.  F/U Gynecologic Oncology in January 2015 Counselled regarding signs/symptoms of recurrent disease. Imaging and biomarkers based on clinical findings.   F/U Dr. Arlyce Dice as scheduled

## 2013-07-27 ENCOUNTER — Encounter: Payer: Self-pay | Admitting: *Deleted

## 2013-07-27 NOTE — Progress Notes (Signed)
Per Clydie Braun - schedule lab appt on 10/29 at 11am.

## 2013-07-31 ENCOUNTER — Other Ambulatory Visit: Payer: BC Managed Care – PPO | Admitting: Lab

## 2013-08-02 ENCOUNTER — Other Ambulatory Visit: Payer: Self-pay | Admitting: Dermatology

## 2013-08-02 ENCOUNTER — Encounter: Payer: Self-pay | Admitting: Genetic Counselor

## 2013-08-02 ENCOUNTER — Ambulatory Visit (HOSPITAL_BASED_OUTPATIENT_CLINIC_OR_DEPARTMENT_OTHER): Payer: BC Managed Care – PPO | Admitting: Genetic Counselor

## 2013-08-02 ENCOUNTER — Other Ambulatory Visit: Payer: BC Managed Care – PPO | Admitting: Lab

## 2013-08-02 DIAGNOSIS — Z803 Family history of malignant neoplasm of breast: Secondary | ICD-10-CM

## 2013-08-02 DIAGNOSIS — Z800 Family history of malignant neoplasm of digestive organs: Secondary | ICD-10-CM

## 2013-08-02 DIAGNOSIS — C569 Malignant neoplasm of unspecified ovary: Secondary | ICD-10-CM

## 2013-08-02 DIAGNOSIS — Z8042 Family history of malignant neoplasm of prostate: Secondary | ICD-10-CM

## 2013-08-02 NOTE — Progress Notes (Signed)
Dr.  Laurette Schimke requested a consultation for genetic counseling and risk assessment for Julia Solis, a 62 y.o. female, for discussion of her personal history of ovarian cancer and family history of breast, prostate, and colon cancer.  She presents to clinic today to discuss the possibility of a genetic predisposition to cancer, and to further clarify her risks, as well as her family members' risks for cancer.   HISTORY OF PRESENT ILLNESS: In 2011, at the age of 46, Julia Solis was diagnosed with ovarian cancer. This was treated with a  TAH-BSO and chemotherapy, no radiation.  She has had several colonoscopies that were negative for polyps.  She is up to date on her mammograms and they are negative.  She had one benign biopsy at age 50s. The patient had undergone genetic testing in 2011 for BRCA mutations and was negative but did not have del/dup testing.  Several family members were also tested for BRCA mutations (some just the Jewish panel, others sound like full BRCA testing) and all have been negative.    Past Medical History  Diagnosis Date  . Cancer 2011    ovarian    Past Surgical History  Procedure Laterality Date  . Abdominal hysterectomy  2011  . Back surgery  1997  . Cesarean section  1982, 1985  . Ganglion cyst excision  1975  . Tonsillectomy  1957    History   Social History  . Marital Status: Divorced    Spouse Name: N/A    Number of Children: 2  . Years of Education: N/A   Occupational History  . [employer]    Social History Main Topics  . Smoking status: Former Smoker -- 1.50 packs/day for 11 years    Types: Cigarettes    Quit date: 10/05/1976  . Smokeless tobacco: None  . Alcohol Use: Yes     Comment: 1-2x/week  . Drug Use: None  . Sexual Activity: None   Other Topics Concern  . None   Social History Narrative  . None    REPRODUCTIVE HISTORY AND PERSONAL RISK ASSESSMENT FACTORS: Menarche was at age 67.   postmenopausal Uterus  Intact: no Ovaries Intact: no G3P2A1, first live birth at age 59  She has not previously undergone treatment for infertility.   Oral Contraceptive use: 7 years   She has not used HRT in the past.    FAMILY HISTORY:  We obtained a detailed, 4-generation family history.  Significant diagnoses are listed below: Family History  Problem Relation Age of Onset  . Prostate cancer Father 46  . Colon cancer Maternal Grandmother     dx in her 55s  . Colon cancer Paternal Grandmother   . Colon polyps Sister   . Breast cancer Paternal Aunt 78  . Colon cancer Paternal Aunt     dx in her 45s  . Cancer Other     maternal great uncle with Cancer - NOS  . Cancer Other     maternal great grandmother with either colon or stomach cancer  . Breast cancer Cousin   . Melanoma Cousin 53  . Breast cancer Cousin 75  . Colon polyps Cousin   . Breast cancer Cousin 45  . Breast cancer Other     fathers maternal aunt  . Breast cancer Cousin     father's maternal 1st cousin  . Breast cancer Cousin     Father's first cousin's daughter    Patient's maternal ancestors are of [place] descent, and  paternal ancestors are of [place] and [place] descent. There is reported Ashkenazi Jewish ancestry. There is no known consanguinity.  GENETIC COUNSELING ASSESSMENT: Julia Solis is a 62 y.o. female with a personal history of ovarian cancer and family history of breast, prostate and colon cancer with negative BRCA testing in several individuals which somewhat suggestive of a hereditary breast cancer syndrome other than BRCA and predisposition to cancer. We, therefore, discussed and recommended the following at today's visit.   DISCUSSION: We reviewed the characteristics, features and inheritance patterns of hereditary cancer syndromes. We also discussed genetic testing, including the appropriate family members to test, the process of testing, insurance coverage and turn-around-time for results. We discussed that  the liklihood of finding a BRCA mutation is low, although maybe the del/dup testing may find a mutation.  As her family hisory is significant for cancer, the cancer could be the result of a different hereditary cancer syndrome.  We reviewed that BRCA mutations are the most common cause of ovarian cancer, but that ovarian cancer can also be seen in Lynch syndrome.  We discussed that her family history would be an atypical presentation of Lynch, there have been reports of families with breast cancer having mutations in the MMR genes.  Because of the multiple different cancers in the family we recommend pursuing the Comprehensive cancer panel.  Julia Solis wants to participate in a study looking at patients with ovarian cancer who do not have BRCA mutations.  She is hoping to get this information back in time to participate in that study, but also wants this information for both she and her family's information.  PLAN: After considering the risks, benefits, and limitations, Julia Solis provided informed consent to pursue genetic testing and the blood sample will be sent to [facility] for analysis of the Comprehensive Cancer Panel. We discussed the implications of a positive, negative and/ or variant of uncertain significance genetic test result. Results should be available within approximately 3-4 weeks' time, at which point they will be disclosed by telephone to Julia Solis, as will any additional recommendations warranted by these results. Julia Solis will receive a summary of her genetic counseling visit and a copy of her results once available. This information will also be available in Epic. We encouraged Julia Solis to remain in contact with cancer genetics annually so that we can continuously update the family history and inform her of any changes in cancer genetics and testing that may be of benefit for her family. Julia Solis's questions were answered to her satisfaction  today. Our contact information was provided should additional questions or concerns arise.  The patient was seen for a total of 60 minutes, greater than 50% of which was spent face-to-face counseling.  This note will also be sent to the referring provider via the electronic medical record. The patient will be supplied with a summary of this genetic counseling discussion as well as educational information on the discussed hereditary cancer syndromes following the conclusion of their visit.   Patient was discussed with Dr. Drue Second.   _______________________________________________________________________ For Office Staff:  Number of people involved in session: 1 Was an Intern/ student involved with case: no

## 2013-08-15 ENCOUNTER — Ambulatory Visit: Payer: BC Managed Care – PPO | Admitting: Gynecologic Oncology

## 2013-08-18 ENCOUNTER — Encounter: Payer: Self-pay | Admitting: Genetic Counselor

## 2013-08-18 ENCOUNTER — Telehealth: Payer: Self-pay | Admitting: Genetic Counselor

## 2013-08-18 NOTE — Telephone Encounter (Signed)
Revealed negative genetic testing on teh comprehensive cancer panel.  Patient requested that I email the report.  I reminded her that email was not secure, her response was "yeah, but I don't care".  I emailed her the report asking for a response back.

## 2013-08-22 ENCOUNTER — Other Ambulatory Visit: Payer: BC Managed Care – PPO | Admitting: Lab

## 2013-08-22 ENCOUNTER — Ambulatory Visit: Payer: BC Managed Care – PPO | Admitting: Lab

## 2013-08-22 ENCOUNTER — Ambulatory Visit: Payer: BC Managed Care – PPO | Admitting: Gynecologic Oncology

## 2013-08-22 ENCOUNTER — Ambulatory Visit: Payer: BC Managed Care – PPO | Attending: Gynecologic Oncology | Admitting: Gynecologic Oncology

## 2013-08-22 ENCOUNTER — Encounter (INDEPENDENT_AMBULATORY_CARE_PROVIDER_SITE_OTHER): Payer: Self-pay

## 2013-08-22 ENCOUNTER — Encounter: Payer: Self-pay | Admitting: Gynecologic Oncology

## 2013-08-22 VITALS — BP 161/88 | HR 64 | Temp 97.9°F | Resp 20 | Ht 64.84 in | Wt 176.9 lb

## 2013-08-22 DIAGNOSIS — N952 Postmenopausal atrophic vaginitis: Secondary | ICD-10-CM | POA: Insufficient documentation

## 2013-08-22 DIAGNOSIS — Z90710 Acquired absence of both cervix and uterus: Secondary | ICD-10-CM | POA: Insufficient documentation

## 2013-08-22 DIAGNOSIS — K5900 Constipation, unspecified: Secondary | ICD-10-CM | POA: Insufficient documentation

## 2013-08-22 DIAGNOSIS — Z09 Encounter for follow-up examination after completed treatment for conditions other than malignant neoplasm: Secondary | ICD-10-CM | POA: Insufficient documentation

## 2013-08-22 DIAGNOSIS — R918 Other nonspecific abnormal finding of lung field: Secondary | ICD-10-CM | POA: Insufficient documentation

## 2013-08-22 DIAGNOSIS — G609 Hereditary and idiopathic neuropathy, unspecified: Secondary | ICD-10-CM | POA: Insufficient documentation

## 2013-08-22 DIAGNOSIS — Z8543 Personal history of malignant neoplasm of ovary: Secondary | ICD-10-CM | POA: Insufficient documentation

## 2013-08-22 DIAGNOSIS — Z9221 Personal history of antineoplastic chemotherapy: Secondary | ICD-10-CM | POA: Insufficient documentation

## 2013-08-22 DIAGNOSIS — I10 Essential (primary) hypertension: Secondary | ICD-10-CM | POA: Insufficient documentation

## 2013-08-22 DIAGNOSIS — C569 Malignant neoplasm of unspecified ovary: Secondary | ICD-10-CM

## 2013-08-22 DIAGNOSIS — Z9079 Acquired absence of other genital organ(s): Secondary | ICD-10-CM | POA: Insufficient documentation

## 2013-08-22 NOTE — Progress Notes (Signed)
OFFICE VISIT:  GYN ONCOLOGY  REASON FOR VISIT: Ovarian cancer surveillance.   HISTORY OF PRESENT ILLNESS: This is a 62 y.o. 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 She subsequently received 6 cycles of Taxol and carboplatin therapy, which is completed in July 2011. No adjuvant therapy was recommended after completion of chemotherapy. At the time of initial presentation, Julia Solis was noted to have pulmonary nodules, they have been sequentially followed and remained without any change in size.   INTERVAL HISTORY:  Patient has done well.  Denies bloating, weight loss, change in appetite, or changes in bowel or rectal habits.  PAST MEDICAL HISTORY:  1. Hypertension diagnosed in 2011.  2. Herniated disk in 1997.  3. Eczema.  4. Stage IC clear cell ovarian cancer.  5.  Low bone density  PAST SURGICAL HISTORY: Left salpingectomy for ectopic pregnancy in 1980s, unspecified back surgery in 1997, left breast lumpectomy in 1993,  total abdominal hysterectomy and bilateral salpingo-oophorectomy, pelvic and periaortic lymph node dissection, and omentectomy in 2012.   SOCIAL HISTORY: Julia Solis is of Jewish ancestry, genetic testing is negative for known mutations. Mother celebrated her 90th birthday. Patient is participating in a "Arm of Women " study managed by Burnett Corrente   FAMILY HISTORY: Several paternal aunts and cousins with breast cancer ranging from age of 28-70.   REVIEW OF SYSTEMS:  No headache, cough, or shortness of breath. 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 Daily constipation, requires use of a stool softener. No rectal bleeding. No  hematuria, or hematocolpos. No swelling of the lower extremities. No abdominal pain, nausea, vomiting,no change in appetite. Otherwise, 10- system review is negative.   PHYSICAL EXAMINATION:  GENERAL: Well-developed female in no acute distress.  VITAL SIGNS: BP 161/88  Pulse 64  Temp(Src) 97.9 F (36.6 C) (Oral)  Resp 20  Ht 5' 4.84" (1.647 m)  Wt 176 lb 14.4 oz (80.241 kg)  BMI 29.58 kg/m2 CHEST: Clear to auscultation.  HEART: Regular rate and rhythm.  ABDOMEN: Soft, obese, and nontender. Midline incision evident without any hernia. No palpable masses or fluid wave.  PELVIC EXAMINATION: Normal external genitalia, Bartholin's, urethral, and Skene's. Very atrophic vagina without any palpable masses. No lesions palpable in the vagina or cul de sac. RECTAL EXAMINATION: Good anal sphincter tone without any masses. No rectovaginal septum nodularity.  EXTREMITIES: No clubbing, cyanosis, or edema.  LN:  NO cervical supraclavicular or inguinal adenopathy IMPRESSION: Julia Solis remains without any evidence of disease since July 2011. Status post treatment with optimal surgical staging and debulking followed by adjuvant Taxol and carboplatin therapy.  Follow-up in 1 year with Dr. Nelly Rout F/U Dr. Arlyce Dice in 6 months   Counselled regarding signs/symptoms of recurrent disease. Imaging and biomarkers based on clinical findings.

## 2013-08-22 NOTE — Patient Instructions (Signed)
F/U with Gyn Onc or Dr. Arlyce Dice in 6 months    Thank you very much Ms. Julia Solis for allowing me to provide care for you today.  I appreciate your confidence in choosing our Gynecologic Oncology team.  If you have any questions about your visit today please call our office and we will get back to you as soon as possible.  [name]. Shamekia Tippets MD., PhD Gynecologic Oncology

## 2013-08-24 ENCOUNTER — Ambulatory Visit: Payer: BC Managed Care – PPO | Admitting: Gynecologic Oncology

## 2013-09-07 ENCOUNTER — Telehealth: Payer: Self-pay | Admitting: Internal Medicine

## 2013-09-07 NOTE — Telephone Encounter (Signed)
Not seen since Fall 2013. Cannot refill without office visit.

## 2013-11-02 ENCOUNTER — Encounter: Payer: BC Managed Care – PPO | Admitting: Genetic Counselor

## 2014-02-19 ENCOUNTER — Other Ambulatory Visit: Payer: BC Managed Care – PPO | Admitting: Internal Medicine

## 2014-02-19 DIAGNOSIS — I10 Essential (primary) hypertension: Secondary | ICD-10-CM

## 2014-02-19 DIAGNOSIS — Z13228 Encounter for screening for other metabolic disorders: Secondary | ICD-10-CM

## 2014-02-19 DIAGNOSIS — E785 Hyperlipidemia, unspecified: Secondary | ICD-10-CM

## 2014-02-19 DIAGNOSIS — Z1329 Encounter for screening for other suspected endocrine disorder: Secondary | ICD-10-CM

## 2014-02-19 DIAGNOSIS — Z130 Encounter for screening for diseases of the blood and blood-forming organs and certain disorders involving the immune mechanism: Secondary | ICD-10-CM

## 2014-02-19 DIAGNOSIS — Z79899 Other long term (current) drug therapy: Secondary | ICD-10-CM

## 2014-02-19 LAB — CBC WITH DIFFERENTIAL/PLATELET
BASOS PCT: 0 % (ref 0–1)
Basophils Absolute: 0 10*3/uL (ref 0.0–0.1)
EOS ABS: 0.3 10*3/uL (ref 0.0–0.7)
Eosinophils Relative: 5 % (ref 0–5)
HCT: 38.3 % (ref 36.0–46.0)
HEMOGLOBIN: 12.9 g/dL (ref 12.0–15.0)
LYMPHS ABS: 2 10*3/uL (ref 0.7–4.0)
Lymphocytes Relative: 39 % (ref 12–46)
MCH: 28.7 pg (ref 26.0–34.0)
MCHC: 33.7 g/dL (ref 30.0–36.0)
MCV: 85.3 fL (ref 78.0–100.0)
MONO ABS: 0.4 10*3/uL (ref 0.1–1.0)
MONOS PCT: 8 % (ref 3–12)
Neutro Abs: 2.4 10*3/uL (ref 1.7–7.7)
Neutrophils Relative %: 48 % (ref 43–77)
Platelets: 202 10*3/uL (ref 150–400)
RBC: 4.49 MIL/uL (ref 3.87–5.11)
RDW: 14.3 % (ref 11.5–15.5)
WBC: 5.1 10*3/uL (ref 4.0–10.5)

## 2014-02-19 LAB — LIPID PANEL
CHOLESTEROL: 216 mg/dL — AB (ref 0–200)
HDL: 73 mg/dL (ref >39)
LDL CALC: 133 mg/dL — AB (ref 0–99)
TRIGLYCERIDES: 51 mg/dL (ref <150)
Total CHOL/HDL Ratio: 3 Ratio
VLDL: 10 mg/dL (ref 0–40)

## 2014-02-19 LAB — COMPREHENSIVE METABOLIC PANEL
ALBUMIN: 4.1 g/dL (ref 3.5–5.2)
ALK PHOS: 95 U/L (ref 39–117)
ALT: 9 U/L (ref 0–35)
AST: 15 U/L (ref 0–37)
BILIRUBIN TOTAL: 0.5 mg/dL (ref 0.2–1.2)
BUN: 13 mg/dL (ref 6–23)
CO2: 29 mEq/L (ref 19–32)
Calcium: 9.2 mg/dL (ref 8.4–10.5)
Chloride: 102 mEq/L (ref 96–112)
Creat: 0.62 mg/dL (ref 0.50–1.10)
Glucose, Bld: 90 mg/dL (ref 70–99)
Potassium: 3.9 mEq/L (ref 3.5–5.3)
SODIUM: 139 meq/L (ref 135–145)
TOTAL PROTEIN: 6.4 g/dL (ref 6.0–8.3)

## 2014-02-19 LAB — TSH: TSH: 2.292 u[IU]/mL (ref 0.350–4.500)

## 2014-02-20 ENCOUNTER — Ambulatory Visit (INDEPENDENT_AMBULATORY_CARE_PROVIDER_SITE_OTHER): Payer: BC Managed Care – PPO | Admitting: Internal Medicine

## 2014-02-20 ENCOUNTER — Encounter: Payer: Self-pay | Admitting: Internal Medicine

## 2014-02-20 VITALS — BP 138/90 | HR 80 | Temp 97.7°F | Ht 65.5 in | Wt 182.0 lb

## 2014-02-20 DIAGNOSIS — L259 Unspecified contact dermatitis, unspecified cause: Secondary | ICD-10-CM

## 2014-02-20 DIAGNOSIS — M899 Disorder of bone, unspecified: Secondary | ICD-10-CM

## 2014-02-20 DIAGNOSIS — K5900 Constipation, unspecified: Secondary | ICD-10-CM

## 2014-02-20 DIAGNOSIS — M549 Dorsalgia, unspecified: Secondary | ICD-10-CM

## 2014-02-20 DIAGNOSIS — Z0000 Encounter for general adult medical examination without abnormal findings: Secondary | ICD-10-CM

## 2014-02-20 DIAGNOSIS — K5909 Other constipation: Secondary | ICD-10-CM

## 2014-02-20 DIAGNOSIS — J309 Allergic rhinitis, unspecified: Secondary | ICD-10-CM | POA: Insufficient documentation

## 2014-02-20 DIAGNOSIS — Z8543 Personal history of malignant neoplasm of ovary: Secondary | ICD-10-CM

## 2014-02-20 DIAGNOSIS — M8580 Other specified disorders of bone density and structure, unspecified site: Secondary | ICD-10-CM

## 2014-02-20 DIAGNOSIS — R9389 Abnormal findings on diagnostic imaging of other specified body structures: Secondary | ICD-10-CM

## 2014-02-20 DIAGNOSIS — M949 Disorder of cartilage, unspecified: Secondary | ICD-10-CM

## 2014-02-20 DIAGNOSIS — G4700 Insomnia, unspecified: Secondary | ICD-10-CM | POA: Insufficient documentation

## 2014-02-20 LAB — VITAMIN D 25 HYDROXY (VIT D DEFICIENCY, FRACTURES): Vit D, 25-Hydroxy: 31 ng/mL (ref 30–89)

## 2014-02-20 MED ORDER — PREDNISONE 10 MG PO KIT: PACK | ORAL | Status: DC

## 2014-02-20 MED ORDER — LORAZEPAM 1 MG PO TABS: 1.0000 mg | ORAL_TABLET | ORAL | Status: DC

## 2014-02-20 MED ORDER — ZOLPIDEM TARTRATE 10 MG PO TABS: 10.0000 mg | ORAL_TABLET | Freq: Every day | ORAL | Status: DC

## 2014-02-20 MED ORDER — MELOXICAM 15 MG PO TABS: 15.0000 mg | ORAL_TABLET | Freq: Every day | ORAL | Status: DC

## 2014-02-20 NOTE — Patient Instructions (Signed)
Take Lorazepam as directed. Ambien for sleep as needed. Return in one year. Have Bone density study in November. Take vitamin D. Take prednisone then try Mobic.

## 2014-02-20 NOTE — Progress Notes (Signed)
Subjective:    Patient ID: Julia Solis, female    DOB: [birth year], 63 y.o.   MRN: [id number]  HPI  63 year old White Female for health maintenance and evaluation of medical problems. Recent problems include low back pain for the past several months. Has been working more in sitting a great deal. History of disc disease status post surgery by Dr. Anderson Malta a number of years ago. Has some chronic numbness left foot from that surgery. Also has issues with contact dermatitis right leg. Has seen Dr. Amy Martinique and has creams to apply to that. Feels that the creams have not been working very well.  Past medical history: History of ovarian cancer I-C grade 3 clear cell status post debulking surgery March 2011 by Dr. [name]. She had total of Donald hysterectomy BSO and bilateral pelvic node dissection. Had aortic node sampling, omentectomy, appendectomy and biopsies. Lymph nodes were negative for malignancy. Symptoms started February 2011 as abdominal gas and bloating.  Preoperative chest x-ray was abnormal. She subsequently had CT of the chest. Apparently has tiny nodules on CT scan thought to be benign. Has seen Dr. Melvyn Novas. Most recent CT scan of chest August 2012 showed left upper lobe linear thickening with no significant change from previous CT scan July 2011. Left lower lobe pulmonary nodule adjacent to the pleura measured 6 mm and had not changed. Had right paratracheal low-density without significant change. This was possibly thought to represent and an elongated pericardial recess. CT of abdomen and pelvis unchanged with no evidence of metastatic disease 2012. Has small stable iliac lymph nodes.  Lumbar disc surgery by Dr. Anderson Malta in 1997. History of some labile hypertension. Ectopic pregnancy 1980. Benign left breast biopsy 1993. C-sections 1992 and 1985. Ganglion cyst removed from wrist 1975. Tonsillectomy 1957. Brow lift approximately 2008. Had a fall in September 2010 and ring her  right knee.  Take stool softener for chronic constipation. Takes vitamin D and calcium supplement. Takes Claritin for allergy symptoms and contact dermatitis.  Social history: She is divorced. Has 2 adult daughters. 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 Does not smoke but did smoke from ages [age]. Social alcohol consumption.  Family history: Father died at age 11 with prostate cancer. Mother living. Brother died at age 59 from an AVM rupture. One sister in good health.  Patient wants to try a steroid dosepak for back pain since it has been aggravating her for a number of months. Says she has tried this previously for 12 days and it worked well for her. She tried taking Aleve and it caused some reflux/esophagitis symptoms. I think this is a reasonable approach and she can try generic Mobic after she finishes the steroid dosepak. She also thinks the steroid dosepak will help her contact dermatitis.  She has some issues with insomnia. Would like to have some Loraxepam 1 mg on hand as well as some Ambien.  Colonoscopy September 2012 by Dr. Earle Gell. Pneumovax given 2011. Prescription given to have Zostavax vaccine at pharmacy.    Review of Systems  Constitutional: Negative.   HENT: Positive for hearing loss.   Eyes:       Wears glasses  Respiratory: Negative.   Cardiovascular: Negative.   Gastrointestinal: Negative.        Heartburn with Aleve recently  Endocrine: Negative.   Musculoskeletal: Positive for back pain.       Flare of back pain.  Skin:       Contact  dermatitis for 20 years right leg  Allergic/Immunologic: Positive for environmental allergies.  Neurological:       Numbness of left foot chronic from back surgery  Hematological: Negative.   Psychiatric/Behavioral: Negative.        Objective:   Physical Exam  Vitals reviewed. Constitutional: She is oriented to person, place, and time. She appears well-developed and well-nourished. No distress.  HENT:  Head:  Normocephalic and atraumatic.  Right Ear: External ear normal.  Left Ear: External ear normal.  Mouth/Throat: Oropharynx is clear and moist. No oropharyngeal exudate.  Eyes: Conjunctivae and EOM are normal. Pupils are equal, round, and reactive to light. Right eye exhibits no discharge. Left eye exhibits no discharge. No scleral icterus.  Neck: Neck supple. No JVD present. No thyromegaly present.  Cardiovascular: Normal rate, regular rhythm and normal heart sounds.   No murmur heard. Split S2 chronic and slightly loud  Pulmonary/Chest: Effort normal and breath sounds normal. She has no wheezes. She has no rales. She exhibits no tenderness.  Breasts normal female without masses  Abdominal: Soft. Bowel sounds are normal. She exhibits no distension and no mass. There is no tenderness. There is no rebound and no guarding.  Genitourinary:  Deferred to GYN  Musculoskeletal: Normal range of motion. She exhibits no edema.  Straight leg raising is negative at 90 bilaterally. Deep tendon reflexes 2+ and symmetrical muscle strength is normal  Lymphadenopathy:    She has no cervical adenopathy.  Neurological: She is alert and oriented to person, place, and time. No cranial nerve deficit. Coordination normal.  Skin: Skin is warm. Rash noted. She is not diaphoretic.  Erythematous macular papular rash covering lateral aspect of right lower leg  Psychiatric: She has a normal mood and affect. Her behavior is normal. Judgment and thought content normal.          Assessment & Plan:  History of ovarian cancer-followed by GYN oncology surgeon, Dr. [name]. Also sees Dr. Evlyn Kanner GYN physician. No longer followed by Dr. Marko Plume, oncologist.  History of abnormal chest CT-last study done 2012  Osteopenia-order given for repeat bone density study at [facility]  Low back pain do to disc disease-Sterapred DS 10 mg 12 day dosepak followed by generic Mobic 15 mg daily  Insomnia  Contact dermatitis  Plan:  Have refilled lorazepam and Ambien. Take  sparingly for insomnia. Patient wants to have both on hand. Doesn't take on a regular basis. Have bone density and mammogram in November. Order written and mailed to patient. Order for Zostavax vaccine written.  Patient will see Dr. [name] in the near future and also Dr. Deatra Ina. Return in one year.

## 2014-08-15 ENCOUNTER — Telehealth: Payer: Self-pay | Admitting: Gynecologic Oncology

## 2014-08-15 NOTE — Telephone Encounter (Signed)
OFFICE VISIT:  GYN ONCOLOGY  REASON FOR VISIT: Ovarian cancer surveillance.   IMPRESSION: Ms. Julia Solis remains without any evidence of disease since July 2011. Status post treatment with optimal surgical staging and debulking followed by adjuvant Taxol and carboplatin therapy.  Follow-up in 1 year with Dr. [name] F/U Dr. Deatra Ina in 6 months   Counselled regarding signs/symptoms of recurrent disease. Imaging and biomarkers based on clinical findings.      HISTORY OF PRESENT ILLNESS: This is a 63 y.o. 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 She subsequently received 6 cycles of Taxol and carboplatin therapy, which is completed in July 2011. No adjuvant therapy was recommended after completion of chemotherapy. At the time of initial presentation, Ms. Julia Solis was noted to have pulmonary nodules, they have been sequentially followed and remained without any change in size.   INTERVAL HISTORY:  Patient has done well.  Denies bloating, weight loss, change in appetite, or changes in bowel or rectal habits.  PAST MEDICAL HISTORY:  1. Hypertension diagnosed in 2011.  2. Herniated disk in 1997.  3. Eczema.  4. Stage IC clear cell ovarian cancer.  5.  Low bone density  PAST SURGICAL HISTORY: Left salpingectomy for ectopic pregnancy in 1980s, unspecified back surgery in 1997, left breast lumpectomy in 1993,  total abdominal hysterectomy and bilateral salpingo-oophorectomy, pelvic and periaortic lymph node dissection, and omentectomy in 2012.   SOCIAL HISTORY: Ms. Julia Solis is of Jewish ancestry, genetic testing is negative for known mutations. Mother celebrated her 69th birthday. Patient is participating in a "Arm of Women " study managed by Oswaldo Conroy   FAMILY HISTORY: Several paternal aunts and cousins with breast cancer ranging from age of 37-70.   REVIEW OF SYSTEMS:  No headache, cough, or shortness of breath. Reports  residual cognitive impairment since  chemotherapy. Residual hearing loss. 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 Daily constipation, requires use of a stool softener. No rectal bleeding. No  hematuria, or hematocolpos. No swelling of the lower extremities. No abdominal pain, nausea, vomiting,no change in appetite. Otherwise, 10- system review is negative.   PHYSICAL EXAMINATION: GENERAL: Well-developed female in no acute distress.  VITAL SIGNS: BP 161/88  Pulse 64  Temp(Src) 97.9 F (36.6 C) (Oral)  Resp 20  Ht 5' 4.84" (1.647 m)  Wt 176 lb 14.4 oz (80.241 kg)  BMI 29.58 kg/m2 CHEST: Clear to auscultation.  HEART: Regular rate and rhythm.  ABDOMEN: Soft, obese, and nontender. Midline incision evident without any hernia. No palpable masses or fluid wave.  PELVIC EXAMINATION: Normal external genitalia, Bartholin's, urethral, and Skene's. Very atrophic vagina without any palpable masses. No lesions palpable in the vagina or cul de sac. RECTAL EXAMINATION: Good anal sphincter tone without any masses. No rectovaginal septum nodularity.  EXTREMITIES: No clubbing, cyanosis, or edema.  LN:  NO cervical supraclavicular or inguinal adenopathy

## 2014-08-16 ENCOUNTER — Encounter: Payer: Self-pay | Admitting: Gynecologic Oncology

## 2014-08-16 ENCOUNTER — Ambulatory Visit: Payer: BC Managed Care – PPO | Attending: Gynecologic Oncology | Admitting: Gynecologic Oncology

## 2014-08-16 VITALS — BP 149/68 | HR 67 | Temp 98.2°F | Resp 16 | Ht 65.5 in | Wt 183.8 lb

## 2014-08-16 DIAGNOSIS — R5383 Other fatigue: Secondary | ICD-10-CM

## 2014-08-16 DIAGNOSIS — Z8543 Personal history of malignant neoplasm of ovary: Secondary | ICD-10-CM | POA: Diagnosis not present

## 2014-08-16 DIAGNOSIS — Z90710 Acquired absence of both cervix and uterus: Secondary | ICD-10-CM | POA: Diagnosis not present

## 2014-08-16 DIAGNOSIS — Z08 Encounter for follow-up examination after completed treatment for malignant neoplasm: Secondary | ICD-10-CM | POA: Insufficient documentation

## 2014-08-16 DIAGNOSIS — I10 Essential (primary) hypertension: Secondary | ICD-10-CM | POA: Insufficient documentation

## 2014-08-16 DIAGNOSIS — Z90722 Acquired absence of ovaries, bilateral: Secondary | ICD-10-CM | POA: Insufficient documentation

## 2014-08-16 DIAGNOSIS — C569 Malignant neoplasm of unspecified ovary: Secondary | ICD-10-CM

## 2014-08-16 DIAGNOSIS — Z9221 Personal history of antineoplastic chemotherapy: Secondary | ICD-10-CM

## 2014-08-16 DIAGNOSIS — R5382 Chronic fatigue, unspecified: Secondary | ICD-10-CM

## 2014-08-16 NOTE — Patient Instructions (Signed)
Julia Solis remains without any evidence of disease since July 2011. Status post treatment with optimal surgical staging and debulking followed by adjuvant Taxol and carboplatin therapy.  F/U Dr. Deatra Ina in 6 months and then annually  [place]!!!     Thank you very much Julia Solis for allowing me to provide care for you today.  I appreciate your confidence in choosing our Gynecologic Oncology team.  If you have any questions about your visit today please call our office and we will get back to you as soon as possible.  Please consider using the website Medlineplus.gov as an Geneticist, molecular.   Francetta Found. Diany Formosa MD., PhD Gynecologic Oncology

## 2014-08-16 NOTE — Progress Notes (Signed)
OFFICE VISIT:  GYN ONCOLOGY  REASON FOR VISIT: Ovarian cancer surveillance.   IMPRESSION: Julia Solis remains without any evidence of disease since July 2011. Status post treatment with optimal surgical staging and debulking followed by adjuvant Taxol and carboplatin therapy.   F/U Dr. Deatra Ina in 6 months then annually  Fatigue:  Advised that is other symptoms such as bloating change in appetite or abdominal pain occur, that will prompt imaging Otherwise, advised to lose weight and to exercise.  Counselled regarding signs/symptoms of recurrent disease. Imaging and biomarkers based on clinical findings.      HISTORY OF PRESENT ILLNESS: This is a 63 y.o. 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 She subsequently received 6 cycles of Taxol and carboplatin therapy, which is completed in July 2011. No adjuvant therapy was recommended after completion of chemotherapy. At the time of initial presentation, Julia Solis was noted to have pulmonary nodules, they have been sequentially followed and remained without any change in size.   INTERVAL HISTORY:  Patient has done well.  Denies bloating, weight loss, change in appetite, or changes in bowel or rectal habits.  PAST MEDICAL HISTORY:  1. Hypertension diagnosed in 2011.  2. Herniated disk in 1997.  3. Eczema.  4. Stage IC clear cell ovarian cancer.  5.  Low bone density 6.  Fatigue  PAST SURGICAL HISTORY: Left salpingectomy for ectopic pregnancy in 1980s, unspecified back surgery in 1997, left breast lumpectomy in 1993,  total abdominal hysterectomy and bilateral salpingo-oophorectomy, pelvic and periaortic lymph node dissection, and omentectomy in 2012.   SOCIAL HISTORY: Julia Solis is of Jewish ancestry, genetic testing is negative for known mutations. Mother celebrated her 32th birthday. Patient is participating in a "Arm of Women " study managed by [employer] [facility].  Is very concerned about the bills associated  with office visits at [place]: Several paternal aunts and cousins with breast cancer ranging from age of 27-70.   REVIEW OF SYSTEMS:  No headache, cough, or shortness of breath. 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 Daily constipation, requires use of a stool softener. No rectal bleeding. No  hematuria, or hematocolpos. No swelling of the lower extremities. No abdominal pain, nausea, vomiting,no change in appetite. Fatigue has continued since the termination of chemotherapy. Otherwise, 10- system review is negative.   PHYSICAL EXAMINATION: GENERAL: Well-developed female in no acute distress.  VITAL SIGNS: BP 149/68 mmHg  Pulse 67  Temp(Src) 98.2 F (36.8 C) (Oral)  Resp 16  Ht 5' 5.5" (1.664 m)  Wt 183 lb 12.8 oz (83.371 kg)  BMI 30.11 kg/m2  Wt Readings from Last 3 Encounters:  08/16/14 183 lb 12.8 oz (83.371 kg)  02/20/14 182 lb (82.555 kg)  08/22/13 176 lb 14.4 oz (80.241 kg)    CHEST: Clear to auscultation.  HEART: Regular rate and rhythm.  ABDOMEN: Soft, obese, and nontender. Midline incision evident without any hernia. No palpable masses or fluid wave.  PELVIC EXAMINATION: Normal external genitalia, Bartholin's, urethral, and Skene's. Very atrophic vagina, mildly tender, without any palpable masses. No lesions palpable in the vagina or cul de sac. RECTAL EXAMINATION: Good anal sphincter tone without any masses. No rectovaginal septum nodularity.  EXTREMITIES: No clubbing, cyanosis, or edema.  LN:  No cervical supraclavicular or inguinal adenopathy

## 2014-09-13 ENCOUNTER — Encounter: Payer: Self-pay | Admitting: Internal Medicine

## 2014-09-19 ENCOUNTER — Encounter: Payer: Self-pay | Admitting: Internal Medicine

## 2015-02-25 ENCOUNTER — Other Ambulatory Visit: Payer: BLUE CROSS/BLUE SHIELD | Admitting: Internal Medicine

## 2015-02-25 DIAGNOSIS — Z1321 Encounter for screening for nutritional disorder: Secondary | ICD-10-CM

## 2015-02-25 DIAGNOSIS — Z1329 Encounter for screening for other suspected endocrine disorder: Secondary | ICD-10-CM

## 2015-02-25 DIAGNOSIS — Z13220 Encounter for screening for lipoid disorders: Secondary | ICD-10-CM

## 2015-02-25 DIAGNOSIS — Z130 Encounter for screening for diseases of the blood and blood-forming organs and certain disorders involving the immune mechanism: Secondary | ICD-10-CM

## 2015-02-25 DIAGNOSIS — Z0000 Encounter for general adult medical examination without abnormal findings: Secondary | ICD-10-CM

## 2015-02-25 LAB — LIPID PANEL
Cholesterol: 217 mg/dL — ABNORMAL HIGH (ref 0–200)
HDL: 73 mg/dL (ref >=46)
LDL Cholesterol: 128 mg/dL — ABNORMAL HIGH (ref 0–99)
Total CHOL/HDL Ratio: 3 Ratio
Triglycerides: 78 mg/dL (ref <150)
VLDL: 16 mg/dL (ref 0–40)

## 2015-02-25 LAB — CBC WITH DIFFERENTIAL/PLATELET
BASOS ABS: 0 10*3/uL (ref 0.0–0.1)
BASOS PCT: 0 % (ref 0–1)
EOS ABS: 0.1 10*3/uL (ref 0.0–0.7)
EOS PCT: 3 % (ref 0–5)
HEMATOCRIT: 39.9 % (ref 36.0–46.0)
Hemoglobin: 13.3 g/dL (ref 12.0–15.0)
Lymphocytes Relative: 31 % (ref 12–46)
Lymphs Abs: 1.5 10*3/uL (ref 0.7–4.0)
MCH: 29.3 pg (ref 26.0–34.0)
MCHC: 33.3 g/dL (ref 30.0–36.0)
MCV: 87.9 fL (ref 78.0–100.0)
MPV: 10.4 fL (ref 8.6–12.4)
Monocytes Absolute: 0.4 10*3/uL (ref 0.1–1.0)
Monocytes Relative: 9 % (ref 3–12)
Neutro Abs: 2.8 10*3/uL (ref 1.7–7.7)
Neutrophils Relative %: 57 % (ref 43–77)
PLATELETS: 199 10*3/uL (ref 150–400)
RBC: 4.54 MIL/uL (ref 3.87–5.11)
RDW: 14.3 % (ref 11.5–15.5)
WBC: 4.9 10*3/uL (ref 4.0–10.5)

## 2015-02-25 LAB — COMPLETE METABOLIC PANEL WITH GFR
ALT: 9 U/L (ref 0–35)
AST: 13 U/L (ref 0–37)
Albumin: 4.1 g/dL (ref 3.5–5.2)
Alkaline Phosphatase: 87 U/L (ref 39–117)
BUN: 11 mg/dL (ref 6–23)
CO2: 27 mEq/L (ref 19–32)
CREATININE: 0.6 mg/dL (ref 0.50–1.10)
Calcium: 9 mg/dL (ref 8.4–10.5)
Chloride: 103 mEq/L (ref 96–112)
Glucose, Bld: 94 mg/dL (ref 70–99)
Potassium: 4 mEq/L (ref 3.5–5.3)
Sodium: 140 mEq/L (ref 135–145)
Total Bilirubin: 0.5 mg/dL (ref 0.2–1.2)
Total Protein: 6.2 g/dL (ref 6.0–8.3)

## 2015-02-25 LAB — TSH: TSH: 2.063 u[IU]/mL (ref 0.350–4.500)

## 2015-02-26 ENCOUNTER — Encounter: Payer: Self-pay | Admitting: Internal Medicine

## 2015-02-26 ENCOUNTER — Ambulatory Visit (INDEPENDENT_AMBULATORY_CARE_PROVIDER_SITE_OTHER): Payer: BLUE CROSS/BLUE SHIELD | Admitting: Internal Medicine

## 2015-02-26 VITALS — BP 126/78 | HR 69 | Temp 97.9°F | Ht 66.0 in | Wt 180.5 lb

## 2015-02-26 DIAGNOSIS — E7800 Pure hypercholesterolemia, unspecified: Secondary | ICD-10-CM

## 2015-02-26 DIAGNOSIS — Z8543 Personal history of malignant neoplasm of ovary: Secondary | ICD-10-CM | POA: Diagnosis not present

## 2015-02-26 DIAGNOSIS — Z0000 Encounter for general adult medical examination without abnormal findings: Secondary | ICD-10-CM

## 2015-02-26 DIAGNOSIS — Z23 Encounter for immunization: Secondary | ICD-10-CM | POA: Diagnosis not present

## 2015-02-26 DIAGNOSIS — M545 Low back pain, unspecified: Secondary | ICD-10-CM

## 2015-02-26 DIAGNOSIS — G4700 Insomnia, unspecified: Secondary | ICD-10-CM | POA: Diagnosis not present

## 2015-02-26 LAB — POCT URINALYSIS DIPSTICK
Bilirubin, UA: NEGATIVE
GLUCOSE UA: NEGATIVE
KETONES UA: NEGATIVE
LEUKOCYTES UA: NEGATIVE
NITRITE UA: NEGATIVE
PROTEIN UA: NEGATIVE
UROBILINOGEN UA: NEGATIVE
pH, UA: 7.5

## 2015-02-26 LAB — VITAMIN D 25 HYDROXY (VIT D DEFICIENCY, FRACTURES): VIT D 25 HYDROXY: 20 ng/mL — AB (ref 30–100)

## 2015-02-26 MED ORDER — LORAZEPAM 1 MG PO TABS: 1.0000 mg | ORAL_TABLET | ORAL | Status: DC

## 2015-02-26 MED ORDER — MELOXICAM 15 MG PO TABS: 15.0000 mg | ORAL_TABLET | Freq: Every day | ORAL | Status: DC

## 2015-02-26 NOTE — Patient Instructions (Signed)
Okay to return in 6 months for fasting lipid panel and office visit. Ativan refilled as well as meloxicam.

## 2015-02-26 NOTE — Progress Notes (Signed)
Subjective:    Patient ID: Julia Solis, female    DOB: [birth year], 64 y.o.   MRN: [id number]  HPI  64 year old White Female for health maintenance and evaluation of medical issues. History of back pain treated with when necessary meloxicam. History of lumbar disc disease status post surgery by Dr. Anderson Malta 1997. Has some chronic numbness in left foot from that surgery.   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 [name]. She had total abdominal hysterectomy BSO and bilateral pelvic node dissection. She had aortic node sampling, omentectomy, appendectomy and biopsies. Lymph nodes were negative for malignancy. Symptoms started February 2011 as abdominal gas and bloating. Preoperative chest x-ray was abnormal. She subsequently had CT of the chest. Apparently has tiny nodules on CT scan thought to be benign. She has seen Dr. Melvyn Novas for this. Most recent CT scan of the chest August 2012 showed left upper lobe linear thickening with no significant change from previous CT scan July 2011. CT of abdomen and pelvis unchanged with no evidence of metastatic disease 2012. Has small stable iliac lymph nodes.  Ectopic pregnancy 1980, benign left breast biopsy 1993, [place]. Ganglion cyst removed from wrist 1975. Tonsillectomy 1957. Rayle lift approximately 2008. Had a fall in September 2010 injuring her right knee.  Social history: She is divorced. Has 2 adult daughters. 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 Does not smoke but did smoke from ages 31 through 77. Social alcohol consumption.  Family history: Father died at age 29 with prostate cancer. Brother died at age 71 of an AVM rupture. One sister in good health.  She has some issues with insomnia and has tried lorazepam and Ambien.  Colonoscopy September 2012 by Dr. Earle Gell. Has had Zostavax vaccine. Given Pneumovax immunization 2011. Due for  Prevnar today.    Review of Systems  Constitutional: Negative.   HENT: Negative.   Eyes: Negative.   Respiratory: Negative.   Cardiovascular: Negative.   Gastrointestinal: Negative.   Endocrine: Negative.   Genitourinary: Negative.   Neurological: Negative.   Hematological: Negative.   Psychiatric/Behavioral: Negative.        Objective:   Physical Exam  Constitutional: She is oriented to person, place, and time. She appears well-developed and well-nourished. No distress.  HENT:  Head: Normocephalic and atraumatic.  Right Ear: External ear normal.  Left Ear: External ear normal.  Mouth/Throat: Oropharynx is clear and moist. No oropharyngeal exudate.  Eyes: Conjunctivae are normal. Pupils are equal, round, and reactive to light. Right eye exhibits no discharge. Left eye exhibits no discharge. No scleral icterus.  Neck: Neck supple. No JVD present. No thyromegaly present.  Cardiovascular: Normal rate, regular rhythm, normal heart sounds and intact distal pulses.   No murmur heard. Split S2 chronic and slightly loud  Pulmonary/Chest: Effort normal and breath sounds normal. She has no wheezes. She has no rales.  Breasts normal female  Abdominal: Soft. Bowel sounds are normal. She exhibits no distension and no mass. There is no tenderness. There is no rebound and no guarding.  Genitourinary:  Deferred to GYN  Musculoskeletal: She exhibits no edema.  Lymphadenopathy:    She has no cervical adenopathy.  Neurological: She is alert and oriented to person, place, and time. She has normal reflexes. No cranial nerve deficit. Coordination normal.  Skin: Skin is warm and dry. No rash noted. She is not diaphoretic.  Psychiatric: She has  a normal mood and affect. Her behavior is normal. Judgment and thought content normal.  Vitals reviewed.         Assessment & Plan:  History of ovarian cancer-no longer followed by oncologist. Continues to see Dr. Evlyn Kanner GYN  physician.  History of abnormal chest CT-last study done 2012  Osteopenia  Low back pain treated with meloxicam when necessary  Insomnia  Plan: Prevnar given today. Refill meloxicam when necessary, refill Ativan. Watch diet and exercise. Patient can be seen in one year but she would like to alternate seeing GYN every 6 months with me every 6 months. Therefore she may choose to come back and see me in 6 months and check lipid panel as she is seeing GYN soon.

## 2015-03-06 ENCOUNTER — Encounter: Payer: Self-pay | Admitting: Internal Medicine

## 2015-07-24 ENCOUNTER — Other Ambulatory Visit: Payer: Self-pay | Admitting: Internal Medicine

## 2015-07-25 NOTE — Telephone Encounter (Signed)
Refill for 6 months. 

## 2015-07-26 NOTE — Telephone Encounter (Signed)
Ativan called in.

## 2015-10-16 DIAGNOSIS — H18459 Nodular corneal degeneration, unspecified eye: Secondary | ICD-10-CM | POA: Insufficient documentation

## 2015-10-16 DIAGNOSIS — H04129 Dry eye syndrome of unspecified lacrimal gland: Secondary | ICD-10-CM | POA: Insufficient documentation

## 2015-10-16 DIAGNOSIS — H2510 Age-related nuclear cataract, unspecified eye: Secondary | ICD-10-CM | POA: Insufficient documentation

## 2015-10-16 DIAGNOSIS — H04123 Dry eye syndrome of bilateral lacrimal glands: Secondary | ICD-10-CM | POA: Insufficient documentation

## 2015-11-13 ENCOUNTER — Ambulatory Visit (INDEPENDENT_AMBULATORY_CARE_PROVIDER_SITE_OTHER): Payer: BLUE CROSS/BLUE SHIELD | Admitting: Family Medicine

## 2015-11-13 VITALS — BP 140/80 | HR 97 | Temp 98.8°F | Resp 18 | Ht 66.0 in | Wt 185.2 lb

## 2015-11-13 DIAGNOSIS — M79674 Pain in right toe(s): Secondary | ICD-10-CM | POA: Diagnosis not present

## 2015-11-13 DIAGNOSIS — L03012 Cellulitis of left finger: Secondary | ICD-10-CM

## 2015-11-13 MED ORDER — AMOXICILLIN-POT CLAVULANATE 875-125 MG PO TABS: 1.0000 | ORAL_TABLET | Freq: Two times a day (BID) | ORAL | Status: DC

## 2015-11-13 NOTE — Progress Notes (Signed)
Subjective:    Patient ID: Julia Solis, female    DOB: 06-12-1951, 65 y.o.   MRN: [id number]  HPI Patient presents today with intermittent right great toe pain for several weeks. No known injury. She has been wearing different shoes, but not sure if her foot has been bumping on the end. Soaked in salt water last night and tried ice last night when the pain was severe. No drainage. She has not taken any otc medications. She has meloxicam at home, but rarely takes.   Past Medical History  Diagnosis Date  . Cancer [facility]) 2011    ovarian   Past Surgical History  Procedure Laterality Date  . Abdominal hysterectomy  2011  . Back surgery  1997  . Cesarean section  1982, 1985  . Ganglion cyst excision  1975  . Tonsillectomy  1957  . Cosmetic surgery     Family History  Problem Relation Age of Onset  . Prostate cancer Father 62  . Cancer Father   . Hypertension Father   . Stroke Father   . Colon cancer Maternal Grandmother     dx in her 18s  . Cancer Maternal Grandmother   . Colon cancer Paternal Grandmother   . Colon polyps Sister   . Breast cancer Paternal Aunt 51  . Colon cancer Paternal Aunt     dx in her 67s  . Cancer Other     maternal great uncle with Cancer - NOS  . Cancer Other     maternal great grandmother with either colon or stomach cancer  . Breast cancer Cousin   . Melanoma Cousin 33  . Breast cancer Cousin 71  . Colon polyps Cousin   . Breast cancer Cousin 52  . Breast cancer Other     fathers maternal aunt  . Breast cancer Cousin     father's maternal 1st cousin  . Breast cancer Cousin     Father's first cousin's daughter  . Hypertension Mother   . Heart disease Maternal Grandfather   . Heart disease Paternal Grandfather    Social History  Substance Use Topics  . Smoking status: Former Smoker -- 1.50 packs/day for 11 years    Types: Cigarettes    Quit date: 10/05/1976  . Smokeless tobacco: None  . Alcohol Use: Yes     Comment: 1-2x/week        Review of Systems No fever or chills, no chest pain, no SOB, +left great toe pain, swelling and redness, no streaking.     Objective:   Physical Exam  Constitutional: She is oriented to person, place, and time. She appears well-developed and well-nourished.  HENT:  Head: Normocephalic and atraumatic.  Eyes: Conjunctivae are normal.  Neck: Normal range of motion. Neck supple.  Cardiovascular: Normal rate.   Pulmonary/Chest: Effort normal.  Neurological: She is alert and oriented to person, place, and time.  Skin: Skin is warm and dry.  Right great toe with erythema and swelling around lateral nail. Small area light yellow under nail.   Vitals reviewed.  BP 140/80 mmHg  Pulse 97  Temp(Src) 98.8 F (37.1 C) (Oral)  Resp 18  Ht 5\' 6"  (1.676 m)  Wt 185 lb 3.2 oz (84.006 kg)  BMI 29.91 kg/m2  SpO2 97%  Procedure note- VCO, local anesthesia obtained with lidocaine 2%, 1 cc, incision made with #10 scalpel, minimal drainage obtained. Dressing applied.     Assessment & Plan:  1. Paronychia, left - amoxicillin-clavulanate (AUGMENTIN) 875-125 MG  tablet; Take 1 tablet by mouth 2 (two) times daily.  Dispense: 14 tablet; Refill: 0 - Provided written and verbal information regarding diagnosis and treatment. - Warm soaks 3-4 x per day - RTC precautions- worsening symptoms with treatment, increased redness/swelling/streaking, fever  Clarene Reamer, FNP-BC  [facility]  11/13/2015 9:44 AM

## 2015-11-13 NOTE — Patient Instructions (Signed)
Paronychia °Paronychia is an infection of the skin that surrounds a nail. It usually affects the skin around a fingernail, but it may also occur near a toenail. It often causes pain and swelling around the nail. This condition may come on suddenly or develop over a longer period. In some cases, a collection of pus (abscess) can form near or under the nail. Usually, paronychia is not serious and it clears up with treatment. °CAUSES °This condition may be caused by bacteria or fungi. It is commonly caused by either Streptococcus or Staphylococcus bacteria. The bacteria or fungi often cause the infection by getting into the affected area through an opening in the skin, such as a cut or a hangnail. °RISK FACTORS °This condition is more likely to develop in: °· People who get their hands wet often, such as those who work as dishwashers, bartenders, or nurses. °· People who bite their fingernails or suck their thumbs. °· People who trim their nails too short. °· People who have hangnails or injured fingertips. °· People who get manicures. °· People who have diabetes. °SYMPTOMS °Symptoms of this condition include: °· Redness and swelling of the skin near the nail. °· Tenderness around the nail when you touch the area. °· Pus-filled bumps under the cuticle. The cuticle is the skin at the base or sides of the nail. °· Fluid or pus under the nail. °· Throbbing pain in the area. °DIAGNOSIS °This condition is usually diagnosed with a physical exam. In some cases, a sample of pus may be taken from an abscess to be tested in a lab. This can help to determine what type of bacteria or fungi is causing the condition. °TREATMENT °Treatment for this condition depends on the cause and severity of the condition. If the condition is mild, it may clear up on its own in a few days. Your health care provider may recommend soaking the affected area in warm water a few times a day. When treatment is needed, the options may  include: °· Antibiotic medicine, if the condition is caused by a bacterial infection. °· Antifungal medicine, if the condition is caused by a fungal infection. °· Incision and drainage, if an abscess is present. In this procedure, the health care provider will cut open the abscess so the pus can drain out. °HOME CARE INSTRUCTIONS °· Soak the affected area in warm water if directed to do so by your health care provider. You may be told to do this for 20 minutes, 2-3 times a day. Keep the area dry in between soakings. °· Take medicines only as directed by your health care provider. °· If you were prescribed an antibiotic medicine, finish all of it even if you start to feel better. °· Keep the affected area clean. °· Do not try to drain a fluid-filled bump yourself. °· If you will be washing dishes or performing other tasks that require your hands to get wet, wear rubber gloves. You should also wear gloves if your hands might come in contact with irritating substances, such as cleaners or chemicals. °· Follow your health care provider's instructions about: °¨ Wound care. °¨ Bandage (dressing) changes and removal. °SEEK MEDICAL CARE IF: °· Your symptoms get worse or do not improve with treatment. °· You have a fever or chills. °· You have redness spreading from the affected area. °· You have continued or increased fluid, blood, or pus coming from the affected area. °· Your finger or knuckle becomes swollen or is difficult to move. °  °  This information is not intended to replace advice given to you by your health care provider. Make sure you discuss any questions you have with your health care provider. °  °Document Released: 03/17/2001 Document Revised: 02/05/2015 Document Reviewed: 08/29/2014 °Elsevier Interactive Patient Education ©2016 [employer] ° °

## 2016-02-27 ENCOUNTER — Other Ambulatory Visit: Payer: BLUE CROSS/BLUE SHIELD | Admitting: Internal Medicine

## 2016-02-27 DIAGNOSIS — Z0000 Encounter for general adult medical examination without abnormal findings: Secondary | ICD-10-CM

## 2016-02-27 DIAGNOSIS — E7800 Pure hypercholesterolemia, unspecified: Secondary | ICD-10-CM

## 2016-02-27 DIAGNOSIS — M8580 Other specified disorders of bone density and structure, unspecified site: Secondary | ICD-10-CM

## 2016-02-27 DIAGNOSIS — Z130 Encounter for screening for diseases of the blood and blood-forming organs and certain disorders involving the immune mechanism: Secondary | ICD-10-CM

## 2016-02-27 DIAGNOSIS — Z1329 Encounter for screening for other suspected endocrine disorder: Secondary | ICD-10-CM

## 2016-02-27 LAB — CBC WITH DIFFERENTIAL/PLATELET
Basophils Absolute: 0 cells/uL (ref 0–200)
Basophils Relative: 0 %
EOS PCT: 3 %
Eosinophils Absolute: 153 cells/uL (ref 15–500)
HCT: 38.5 % (ref 35.0–45.0)
Hemoglobin: 12.3 g/dL (ref 11.7–15.5)
LYMPHS PCT: 32 %
Lymphs Abs: 1632 cells/uL (ref 850–3900)
MCH: 27.8 pg (ref 27.0–33.0)
MCHC: 31.9 g/dL — ABNORMAL LOW (ref 32.0–36.0)
MCV: 87.1 fL (ref 80.0–100.0)
MONO ABS: 408 {cells}/uL (ref 200–950)
MPV: 10.3 fL (ref 7.5–12.5)
Monocytes Relative: 8 %
Neutro Abs: 2907 cells/uL (ref 1500–7800)
Neutrophils Relative %: 57 %
Platelets: 221 10*3/uL (ref 140–400)
RBC: 4.42 MIL/uL (ref 3.80–5.10)
RDW: 14.5 % (ref 11.0–15.0)
WBC: 5.1 10*3/uL (ref 3.8–10.8)

## 2016-02-27 LAB — COMPLETE METABOLIC PANEL WITH GFR
ALT: 10 U/L (ref 6–29)
AST: 17 U/L (ref 10–35)
Albumin: 3.8 g/dL (ref 3.6–5.1)
Alkaline Phosphatase: 87 U/L (ref 33–130)
BUN: 14 mg/dL (ref 7–25)
CALCIUM: 9 mg/dL (ref 8.6–10.4)
CO2: 27 mmol/L (ref 20–31)
CREATININE: 0.63 mg/dL (ref 0.50–0.99)
Chloride: 103 mmol/L (ref 98–110)
GFR, Est African American: >89 mL/min (ref >=60)
GFR, Est Non African American: >89 mL/min (ref >=60)
GLUCOSE: 91 mg/dL (ref 65–99)
POTASSIUM: 4.1 mmol/L (ref 3.5–5.3)
Sodium: 140 mmol/L (ref 135–146)
Total Bilirubin: 0.5 mg/dL (ref 0.2–1.2)
Total Protein: 6.3 g/dL (ref 6.1–8.1)

## 2016-02-27 LAB — LIPID PANEL
CHOL/HDL RATIO: 3 ratio (ref <=5.0)
CHOLESTEROL: 239 mg/dL — AB (ref 125–200)
HDL: 80 mg/dL (ref >=46)
LDL Cholesterol: 146 mg/dL — ABNORMAL HIGH (ref <130)
Triglycerides: 66 mg/dL (ref <150)
VLDL: 13 mg/dL (ref <30)

## 2016-02-27 LAB — TSH: TSH: 2.14 mIU/L

## 2016-02-28 LAB — VITAMIN D 25 HYDROXY (VIT D DEFICIENCY, FRACTURES): VIT D 25 HYDROXY: 20 ng/mL — AB (ref 30–100)

## 2016-03-03 ENCOUNTER — Ambulatory Visit (INDEPENDENT_AMBULATORY_CARE_PROVIDER_SITE_OTHER): Payer: BLUE CROSS/BLUE SHIELD | Admitting: Internal Medicine

## 2016-03-03 ENCOUNTER — Encounter: Payer: Self-pay | Admitting: Internal Medicine

## 2016-03-03 VITALS — BP 130/84 | HR 82 | Temp 98.1°F | Resp 18 | Ht 65.75 in | Wt 185.0 lb

## 2016-03-03 DIAGNOSIS — G4700 Insomnia, unspecified: Secondary | ICD-10-CM | POA: Diagnosis not present

## 2016-03-03 DIAGNOSIS — E785 Hyperlipidemia, unspecified: Secondary | ICD-10-CM

## 2016-03-03 DIAGNOSIS — M8580 Other specified disorders of bone density and structure, unspecified site: Secondary | ICD-10-CM | POA: Diagnosis not present

## 2016-03-03 DIAGNOSIS — R9389 Abnormal findings on diagnostic imaging of other specified body structures: Secondary | ICD-10-CM

## 2016-03-03 DIAGNOSIS — Z0000 Encounter for general adult medical examination without abnormal findings: Secondary | ICD-10-CM | POA: Diagnosis not present

## 2016-03-03 DIAGNOSIS — Z8543 Personal history of malignant neoplasm of ovary: Secondary | ICD-10-CM | POA: Diagnosis not present

## 2016-03-03 DIAGNOSIS — R938 Abnormal findings on diagnostic imaging of other specified body structures: Secondary | ICD-10-CM

## 2016-03-03 DIAGNOSIS — M545 Low back pain: Secondary | ICD-10-CM

## 2016-03-03 LAB — POCT URINALYSIS DIPSTICK
Bilirubin, UA: NEGATIVE
Blood, UA: NEGATIVE
Glucose, UA: NEGATIVE
Ketones, UA: NEGATIVE
LEUKOCYTES UA: NEGATIVE
Nitrite, UA: NEGATIVE
PROTEIN UA: NEGATIVE
Spec Grav, UA: 1.02
UROBILINOGEN UA: 0.2
pH, UA: 7

## 2016-03-03 MED ORDER — MELOXICAM 15 MG PO TABS: 15.0000 mg | ORAL_TABLET | Freq: Every day | ORAL | Status: DC

## 2016-03-03 MED ORDER — LORAZEPAM 1 MG PO TABS: 1.0000 mg | ORAL_TABLET | Freq: Every day | ORAL | Status: DC

## 2016-03-03 NOTE — Patient Instructions (Signed)
It was a pleasure to see you today. Take 2000 units Vitamin D daily.  Watch cholesterol, Diet and exercise. Ativan refilled. RTC one year.

## 2016-03-03 NOTE — Progress Notes (Signed)
Subjective:    Patient ID: Julia Solis, female    DOB: Jul 09, 1951, 65 y.o.   MRN: [id number]  HPI 65 year old Female in today for health maintenance exam and evaluation of medical issues. No new complaints or problems. Generally feels well. History of back pain treated with when necessary meloxicam. History of lumbar disc disease status post surgery by Dr. Katheran Awe in 1997. Has some chronic numbness in left foot from that surgery.  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 [name]. She had total abdominal hysterectomy BSO and bilateral pelvic node dissection. She had aortic node sampling, omentectomy, appendectomy and biopsies. Lymph nodes were negative for malignancy. Symptoms started February 2011 his abdominal gas and bloating. Preoperative chest x-ray was abnormal. She had CT of the chest. Apparently has tiny nodules on CT thought to be benign. She is seeing Dr. Melvyn Novas for this. Most recent CT scan of the chest was August 2012 which showed left upper lobe linear thickening with no significant change from previous CT January 2011. CT of abdomen and pelvis unchanged with no evidence of metastatic disease 2012. Has small stable iliac lymph nodes.  Ectopic pregnancy 1980, benign left breast biopsy 1993, [place]. Ganglion cyst removed from wrist 1975. Tonsillectomy 1957.  Brow Lift  apprx. 2008. Had a fall in September 2010 injuring her right knee.   Social history: She is divorced. Has 2 adult daughters. 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 Social alcohol consumption.  Family history: Father died at age 16 with prostate cancer. Brother died at age 29 of an AVM rupture. One sister in good health.  Patient has some issues with insomnia and is tried lorazepam and Ambien.  Colonoscopy September 2012 by Dr. Earle Gell. Has had  Prevnar, Pneumovax, TD happens Zostavax vaccines.    Review of Systems  Constitutional: Negative.   All other systems reviewed and are negative.      Objective:   Physical Exam  Constitutional: She is oriented to person, place, and time. She appears well-developed and well-nourished. No distress.  HENT:  Head: Normocephalic and atraumatic.  Right Ear: External ear normal.  Left Ear: External ear normal.  Mouth/Throat: Oropharynx is clear and moist. No oropharyngeal exudate.  Eyes: Conjunctivae and EOM are normal. Pupils are equal, round, and reactive to light. Right eye exhibits no discharge. Left eye exhibits no discharge. No scleral icterus.  Neck: Neck supple. No JVD present. No thyromegaly present.  Cardiovascular: Normal rate, regular rhythm, normal heart sounds and intact distal pulses.   No murmur heard. Pulmonary/Chest: Effort normal and breath sounds normal. No respiratory distress. She has no wheezes. She has no rales. She exhibits no tenderness.  Abdominal: Soft. Bowel sounds are normal. She exhibits no distension and no mass. There is no tenderness. There is no rebound and no guarding.  Genitourinary:  Deferred  Musculoskeletal: Normal range of motion. She exhibits no edema.  Lymphadenopathy:    She has no cervical adenopathy.  Neurological: She is alert and oriented to person, place, and time. She has normal reflexes. No cranial nerve deficit. Coordination normal.  Skin: Skin is warm and dry. No rash noted. She is not diaphoretic.  Psychiatric: She has a normal mood and affect. Her behavior is normal. Judgment and thought content normal.  Vitals reviewed.  Assessment & Plan:  History of ovarian cancer-no longer followed by an colleges. Continues to see Dr. Evlyn Kanner GYN.  History of abnormal chest CT-last study done 2012 .Apparently Dr. Melvyn Novas did not think repeat study was necessary.  Osteopenia  Low back pain treated with when necessary  meloxicam  Insomnia  Hyperlipidemia-total cholesterol 239 and LDL cholesterol 146 -work on diet and exercise and recheck in one year     Plan: Lab work reviewed with her. Watch diet and exercise. Refill lorazepam. Return in one year or as needed.

## 2016-10-14 ENCOUNTER — Encounter: Payer: Self-pay | Admitting: Internal Medicine

## 2016-11-02 ENCOUNTER — Ambulatory Visit
Admission: RE | Admit: 2016-11-02 | Discharge: 2016-11-02 | Disposition: A | Discharge status: Home / Self Care | Payer: BLUE CROSS/BLUE SHIELD | Source: Ambulatory Visit | Attending: Internal Medicine | Admitting: Internal Medicine

## 2016-11-02 ENCOUNTER — Ambulatory Visit (INDEPENDENT_AMBULATORY_CARE_PROVIDER_SITE_OTHER): Payer: BLUE CROSS/BLUE SHIELD | Admitting: Internal Medicine

## 2016-11-02 ENCOUNTER — Encounter: Payer: Self-pay | Admitting: Internal Medicine

## 2016-11-02 VITALS — BP 144/100 | HR 82 | Temp 97.1°F | Wt 185.0 lb

## 2016-11-02 DIAGNOSIS — J22 Unspecified acute lower respiratory infection: Secondary | ICD-10-CM

## 2016-11-02 DIAGNOSIS — Z8543 Personal history of malignant neoplasm of ovary: Secondary | ICD-10-CM | POA: Diagnosis not present

## 2016-11-02 DIAGNOSIS — R05 Cough: Secondary | ICD-10-CM

## 2016-11-02 DIAGNOSIS — R059 Cough, unspecified: Secondary | ICD-10-CM

## 2016-11-02 DIAGNOSIS — R5383 Other fatigue: Secondary | ICD-10-CM | POA: Diagnosis not present

## 2016-11-02 MED ORDER — PREDNISONE 10 MG PO TABS
ORAL_TABLET | ORAL | 0 refills | Status: DC
Start: 2016-11-02 — End: 2017-04-19

## 2016-11-02 MED ORDER — CLARITHROMYCIN 500 MG PO TABS: 500.0000 mg | ORAL_TABLET | Freq: Two times a day (BID) | ORAL | 0 refills | Status: DC

## 2016-11-02 NOTE — Progress Notes (Signed)
   Subjective:    Patient ID: Julia Solis, female    DOB: 07-12-1951, 66 y.o.   MRN: [id number]  HPI   66 year old Female protracted cough and congestion along with fatigue since early January. She was at dermatologist and was placed on doxycycline for dermatology problem which she took for about 10 days 100 mg twice daily. It did not seem to help. At times she has some discolored sputum. Has had a lot of postnasal drip. She tried staying out of work a couple of days at the time but simply could not recover. Her pulse oximetry is normal. No significant wheezing.  She is scheduled for jury duty this coming Wednesday, January 31. A letter was written to excuse her at this time.  Apparently a grandchild has been sick with respiratory infection symptoms.    Review of Systems see above     Objective:   Physical Exam Skin warm and dry. Nodes none. TMs are clear. Pharynx is clear. Neck is supple. Some coarse breath sounds in the lower lobes otherwise clear without wheezing. Cardiac exam regular rate and rhythm.       Assessment & Plan:  Acute bronchitis  Plan: She apparently is unable to tolerate Cipro because of a rash that appeared while she was on chemotherapy while taking it. Were going to give her Biaxin 500 mg twice daily for 10 days. She'll take Sterapred DS 10 mg 6 day dosepak in tapering course. She'll have chest x-ray. Note given to excuse from jury duty this week. Delsym for cough.  Total time taken with patient including writing letter for jury duty 25 minutes.

## 2016-11-02 NOTE — Patient Instructions (Signed)
Take Biaxin 500 mg twice daily for 10 days. Rest and drink plenty of fluids. Delsym as needed for cough. Sterapred DS 10 mg 6 day Dosepak.

## 2016-11-09 ENCOUNTER — Telehealth: Payer: Self-pay | Admitting: Internal Medicine

## 2016-11-09 MED ORDER — ALBUTEROL SULFATE HFA 108 (90 BASE) MCG/ACT IN AERS: 2.0000 | INHALATION_SPRAY | Freq: Four times a day (QID) | RESPIRATORY_TRACT | 0 refills | Status: DC | PRN

## 2016-11-09 NOTE — Telephone Encounter (Signed)
Patient is feeling somewhat better.  However, she is still feeing somewhat tight in her chest.  She has a couple days left on her antibiotics.  She finished the steroids on Saturday.  She is wondering if an inhaler may be of any help to her?  States she has used an inhaler a long while ago.    Pharmacy:  [facility] on [address]  Best # for contact:  [phone]

## 2016-11-09 NOTE — Telephone Encounter (Signed)
Call in Albuterol inhaler 2 sprays po 4 times a day

## 2016-11-09 NOTE — Addendum Note (Signed)
Addended by: Inocencio Homes on: 11/09/2016 12:38 PM   Modules accepted: Orders

## 2016-11-09 NOTE — Telephone Encounter (Signed)
Sent, pt aware.  

## 2016-11-10 ENCOUNTER — Other Ambulatory Visit: Payer: Self-pay

## 2016-11-10 MED ORDER — ZOLPIDEM TARTRATE 10 MG PO TABS: 10.0000 mg | ORAL_TABLET | Freq: Every day | ORAL | 0 refills | Status: DC

## 2017-03-24 DIAGNOSIS — Z01419 Encounter for gynecological examination (general) (routine) without abnormal findings: Secondary | ICD-10-CM | POA: Diagnosis not present

## 2017-04-12 ENCOUNTER — Other Ambulatory Visit: Payer: PPO | Admitting: Internal Medicine

## 2017-04-12 DIAGNOSIS — E7800 Pure hypercholesterolemia, unspecified: Secondary | ICD-10-CM

## 2017-04-12 DIAGNOSIS — G4700 Insomnia, unspecified: Secondary | ICD-10-CM | POA: Diagnosis not present

## 2017-04-12 DIAGNOSIS — Z0000 Encounter for general adult medical examination without abnormal findings: Secondary | ICD-10-CM

## 2017-04-12 DIAGNOSIS — E785 Hyperlipidemia, unspecified: Secondary | ICD-10-CM | POA: Diagnosis not present

## 2017-04-12 DIAGNOSIS — Z130 Encounter for screening for diseases of the blood and blood-forming organs and certain disorders involving the immune mechanism: Secondary | ICD-10-CM | POA: Diagnosis not present

## 2017-04-12 DIAGNOSIS — Z1329 Encounter for screening for other suspected endocrine disorder: Secondary | ICD-10-CM | POA: Diagnosis not present

## 2017-04-12 DIAGNOSIS — E559 Vitamin D deficiency, unspecified: Secondary | ICD-10-CM | POA: Diagnosis not present

## 2017-04-12 DIAGNOSIS — M8580 Other specified disorders of bone density and structure, unspecified site: Secondary | ICD-10-CM

## 2017-04-13 LAB — COMPLETE METABOLIC PANEL WITH GFR
ALT: 8 U/L (ref 6–29)
AST: 14 U/L (ref 10–35)
Albumin: 4.1 g/dL (ref 3.6–5.1)
Alkaline Phosphatase: 98 U/L (ref 33–130)
BUN: 15 mg/dL (ref 7–25)
CHLORIDE: 104 mmol/L (ref 98–110)
CO2: 26 mmol/L (ref 20–31)
Calcium: 9.1 mg/dL (ref 8.6–10.4)
Creat: 0.66 mg/dL (ref 0.50–0.99)
GFR, Est African American: >89 mL/min (ref >=60)
GFR, Est Non African American: >89 mL/min (ref >=60)
GLUCOSE: 91 mg/dL (ref 65–99)
POTASSIUM: 4.2 mmol/L (ref 3.5–5.3)
SODIUM: 141 mmol/L (ref 135–146)
Total Bilirubin: 0.5 mg/dL (ref 0.2–1.2)
Total Protein: 6.6 g/dL (ref 6.1–8.1)

## 2017-04-13 LAB — CBC WITH DIFFERENTIAL/PLATELET
Basophils Absolute: 0 cells/uL (ref 0–200)
Basophils Relative: 0 %
EOS PCT: 3 %
Eosinophils Absolute: 159 cells/uL (ref 15–500)
HCT: 41.6 % (ref 35.0–45.0)
HEMOGLOBIN: 13.5 g/dL (ref 11.7–15.5)
LYMPHS ABS: 1855 {cells}/uL (ref 850–3900)
LYMPHS PCT: 35 %
MCH: 29 pg (ref 27.0–33.0)
MCHC: 32.5 g/dL (ref 32.0–36.0)
MCV: 89.3 fL (ref 80.0–100.0)
MPV: 10 fL (ref 7.5–12.5)
Monocytes Absolute: 530 cells/uL (ref 200–950)
Monocytes Relative: 10 %
NEUTROS PCT: 52 %
Neutro Abs: 2756 cells/uL (ref 1500–7800)
Platelets: 217 10*3/uL (ref 140–400)
RBC: 4.66 MIL/uL (ref 3.80–5.10)
RDW: 14.4 % (ref 11.0–15.0)
WBC: 5.3 10*3/uL (ref 3.8–10.8)

## 2017-04-13 LAB — LIPID PANEL
CHOLESTEROL: 239 mg/dL — AB (ref <200)
HDL: 84 mg/dL (ref >50)
LDL Cholesterol: 139 mg/dL — ABNORMAL HIGH (ref <100)
TRIGLYCERIDES: 79 mg/dL (ref <150)
Total CHOL/HDL Ratio: 2.8 Ratio (ref <5.0)
VLDL: 16 mg/dL (ref <30)

## 2017-04-13 LAB — VITAMIN D 25 HYDROXY (VIT D DEFICIENCY, FRACTURES): Vit D, 25-Hydroxy: 28 ng/mL — ABNORMAL LOW (ref 30–100)

## 2017-04-13 LAB — TSH: TSH: 2.77 mIU/L

## 2017-04-19 ENCOUNTER — Ambulatory Visit (INDEPENDENT_AMBULATORY_CARE_PROVIDER_SITE_OTHER): Payer: PPO | Admitting: Internal Medicine

## 2017-04-19 ENCOUNTER — Encounter: Payer: Self-pay | Admitting: Internal Medicine

## 2017-04-19 VITALS — BP 130/90 | HR 84 | Temp 97.5°F | Ht 64.75 in | Wt 183.0 lb

## 2017-04-19 DIAGNOSIS — G4709 Other insomnia: Secondary | ICD-10-CM | POA: Diagnosis not present

## 2017-04-19 DIAGNOSIS — M542 Cervicalgia: Secondary | ICD-10-CM | POA: Diagnosis not present

## 2017-04-19 DIAGNOSIS — R208 Other disturbances of skin sensation: Secondary | ICD-10-CM

## 2017-04-19 DIAGNOSIS — C569 Malignant neoplasm of unspecified ovary: Secondary | ICD-10-CM

## 2017-04-19 DIAGNOSIS — Z0000 Encounter for general adult medical examination without abnormal findings: Secondary | ICD-10-CM | POA: Diagnosis not present

## 2017-04-19 DIAGNOSIS — H903 Sensorineural hearing loss, bilateral: Secondary | ICD-10-CM | POA: Diagnosis not present

## 2017-04-19 DIAGNOSIS — M545 Low back pain, unspecified: Secondary | ICD-10-CM

## 2017-04-19 DIAGNOSIS — E559 Vitamin D deficiency, unspecified: Secondary | ICD-10-CM | POA: Diagnosis not present

## 2017-04-19 DIAGNOSIS — E7800 Pure hypercholesterolemia, unspecified: Secondary | ICD-10-CM | POA: Diagnosis not present

## 2017-04-19 DIAGNOSIS — R200 Anesthesia of skin: Secondary | ICD-10-CM

## 2017-04-19 DIAGNOSIS — Z23 Encounter for immunization: Secondary | ICD-10-CM | POA: Diagnosis not present

## 2017-04-19 LAB — POCT URINALYSIS DIPSTICK
BILIRUBIN UA: NEGATIVE
GLUCOSE UA: NEGATIVE
KETONES UA: NEGATIVE
Leukocytes, UA: NEGATIVE
Nitrite, UA: NEGATIVE
Protein, UA: NEGATIVE
SPEC GRAV UA: 1.015 (ref 1.010–1.025)
Urobilinogen, UA: 0.2 E.U./dL
pH, UA: 6.5 (ref 5.0–8.0)

## 2017-04-19 NOTE — Patient Instructions (Addendum)
It was a pleasure to see you today. Work on diet exercise and weight loss. Return in one year or as needed. Prevnar 13 given today. Patient does not want to be on statin medication. Vitamin D deficiency to be treated with over-the-counter supplement

## 2017-04-19 NOTE — Progress Notes (Signed)
Subjective:    Patient ID: Julia Solis, female    DOB: 1951/05/15, 66 y.o.   MRN: [id number]  HPI  66 year old Female for Welcome to Medicare physical examination and evaluation of medical issues.  This weekend, she developed some left-sided neck pain. No known overexertion or injury.  Recently saw Dr. Deatra Ina for GYN care and he told her she did not need to return for 2 years. History of ovarian cancer and is status post hysterectomy and BSO for that. She is no longer a candidate for Pap smear status post the surgery.  History of lumbar disc disease status post surgery by Dr. [name] in 1997. Has some chronic numbness in left foot from that surgery. Did try meloxicam for while for back pain but no longer takes that. Takes over-the-counter ibuprofen when necessary.  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 [name]. She had total abdominal hysterectomy, BSO and bilateral pelvic node dissection. She had aortic node sampling, omentectomy, appendectomy and biopsies. Lymph nodes were negative for malignancy. Symptoms started February 2011 with abdominal gas and bloating. Preoperative chest x-ray was abnormal. She had CT of the chest. Apparently has tiny nodules on CT of the chest thought to be benign and has seen Dr. Melvyn Novas for this. She had chest x-ray in January of this year when she had bronchitis and similar appearance of lung parenchyma  noted.  Additional past medical history: Ectopic pregnancy 1980, benign left breast biopsy 1993. C-sections 1992 and 1985. Ganglion cyst removed from wrist 1975. Tonsillectomy 1957. Brow lift approximately 2008.  She had a fall in September 2010 injuring her right knee  Social history: She is divorced and has 2 adult daughters. 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 She is now working 3 days a week. Does not smoke but did smoke from the ages of 66 through 77. Social alcohol  consumption.  Father died at age 4 with prostate cancer. Brother died at age 36 of an AVM rupture. One sister in good health.  She has some issues with insomnia and has tried lorazepam and Ambien.  Colonoscopy September 2012 by Dr. Earle Gell.  Prevnar 13 given today since she recently turned 11. She has had Zostavax vaccine in the past and we discussed Shingrix today. Tetanus immunization is up to date until 2020.  Continues to have some mild vitamin D deficiency. When she remembers does take over-the-counter supplement.            Review of Systems  Constitutional: Negative.   HENT:       Wears bilateral hearing aids  Respiratory: Negative.   Cardiovascular: Negative.   Gastrointestinal: Negative.   Genitourinary: Negative.   Neurological: Negative.   Psychiatric/Behavioral: Negative.        Objective:   Physical Exam  Constitutional: She is oriented to person, place, and time. She appears well-developed and well-nourished. No distress.  HENT:  Head: Normocephalic and atraumatic.  Right Ear: External ear normal.  Left Ear: External ear normal.  Mouth/Throat: Oropharynx is clear and moist. No oropharyngeal exudate.  Eyes: Pupils are equal, round, and reactive to light. Conjunctivae and EOM are normal. Right eye exhibits no discharge. Left eye exhibits no discharge. No scleral icterus.  Neck: Neck supple. No JVD present. No thyromegaly present.  Cardiovascular: Normal rate, regular rhythm, normal heart sounds and intact distal pulses.   No murmur heard. Pulmonary/Chest: Effort normal and breath sounds  normal. No respiratory distress. She has no wheezes. She has no rales.  Abdominal: Soft. Bowel sounds are normal. She exhibits no distension and no mass. There is no tenderness. There is no rebound and no guarding.  Musculoskeletal:  No adenopathy in left neck. Palpable spasm noted  Lymphadenopathy:    She has no cervical adenopathy.  Neurological: She is alert  and oriented to person, place, and time. She has normal reflexes. No cranial nerve deficit. Coordination normal.  Skin: Skin is warm and dry. No rash noted. She is not diaphoretic.  Psychiatric: She has a normal mood and affect. Her behavior is normal. Judgment and thought content normal.  Vitals reviewed.         Assessment & Plan:  Routine health maintenance exam and welcome to Medicare physical exam.  Prevnar 13 given today.  Hyperlipidemia-does not want to be a lipid-lowering medication  History of ovarian cancer with no evidence of recurrence  Left neck pain-likely musculoskeletal advise heat or ice to neck and see if symptoms will resolve over the next few days as they just started over the weekend.  Small hemolyzed occult blood on dipstick UA. Sent for microscopic evaluation.  Insomnia treated with lorazepam  Return in one year or as needed  Subjective:   Patient presents for Medicare Annual/Subsequent preventive examination.  Review Past Medical/Family/Social:See above   Risk Factors  Current exercise habits: Encourage more exercise Dietary issues discussed: Low fat low carbohydrate discussed  Cardiac risk factors:Hyperlipidemia  Depression Screen  (Note: if answer to either of the following is "Yes", a more complete depression screening is indicated)   Over the past two weeks, have you felt down, depressed or hopeless? No  Over the past two weeks, have you felt little interest or pleasure in doing things? No Have you lost interest or pleasure in daily life? No Do you often feel hopeless? No Do you cry easily over simple problems? No   Activities of Daily Living  In your present state of health, do you have any difficulty performing the following activities?:   Driving? No  Managing money? No  Feeding yourself? No  Getting from bed to chair? No  Climbing a flight of stairs? No  Preparing food and eating?: No  Bathing or showering? No  Getting dressed:  No  Getting to the toilet? No  Using the toilet:No  Moving around from place to place: No  In the past year have you fallen or had a near fall?:No  Are you sexually active? No  Do you have more than one partner? No   Hearing Difficulties: No  Do you often ask people to speak up or repeat themselves? No -But have hearing aids Do you experience ringing or noises in your ears? No  Do you have difficulty understanding soft or whispered voices? No  Do you feel that you have a problem with memory? No Do you often misplace items? No    Home Safety:  Do you have a smoke alarm at your residence? Yes Do you have grab bars in the bathroom?No Do you have throw rugs in your house?Yes   Cognitive Testing  Alert? Yes Normal Appearance?Yes  Oriented to person? Yes Place? Yes  Time? Yes  Recall of three objects? Yes  Can perform simple calculations? Yes  Displays appropriate judgment?Yes  Can read the correct time from a watch face?Yes   List the Names of Other Physician/Practitioners you currently use:  See referral list for the physicians patient is  currently seeing.  Dr. Liliane [facility]   Review of Systems: See above   Objective:     General appearance: Appears stated age and mildly obese  Head: Normocephalic, without obvious abnormality, atraumatic  Eyes: conj clear, EOMi PEERLA  Ears: normal TM's and external ear canals both ears  Nose: Nares normal. Septum midline. Mucosa normal. No drainage or sinus tenderness.  Throat: lips, mucosa, and tongue normal; teeth and gums normal  Neck: no adenopathy, no carotid bruit, no JVD, supple, symmetrical, trachea midline and thyroid not enlarged, symmetric, no tenderness/mass/nodules  No CVA tenderness.  Lungs: clear to auscultation bilaterally  Breasts: normal appearance, no masses or tenderness Heart: regular rate and rhythm, S1, S2 normal, no murmur, click, rub or gallop  Abdomen: soft, non-tender; bowel sounds normal; no masses, no  organomegaly  Musculoskeletal: ROM normal in all joints, no crepitus, no deformity, Normal muscle strengthen. Back  is symmetric, no curvature. Skin: Skin color, texture, turgor normal. No rashes or lesions  Lymph nodes: Cervical, supraclavicular, and axillary nodes normal.  Neurologic: CN 2 -12 Normal, Normal symmetric reflexes. Normal coordination and gait  Psych: Alert & Oriented x 3, Mood appear stable.    Assessment:    Annual wellness medicare exam   Plan:    During the course of the visit the patient was educated and counseled about appropriate screening and preventive services including:   EKG  Prevnar 13  Annual mammogram     Patient Instructions (the written plan) was given to the patient.  Medicare Attestation  I have personally reviewed:  The patient's medical and social history  Their use of alcohol, tobacco or illicit drugs  Their current medications and supplements  The patient's functional ability including ADLs,fall risks, home safety risks, cognitive, and hearing and visual impairment  Diet and physical activities  Evidence for depression or mood disorders  The patient's weight, height, BMI, and visual acuity have been recorded in the chart. I have made referrals, counseling, and provided education to the patient based on review of the above and I have provided the patient with a written personalized care plan for preventive services.

## 2017-04-23 ENCOUNTER — Telehealth: Payer: Self-pay

## 2017-04-23 NOTE — Telephone Encounter (Signed)
Lab was unable to do microscopic because specimen was in cup and not the yellow cap tube the request. Called and informed pt and she said she thinks this has happened before but can not remember exactly and [place] at the beach so she will call us when she returns and come in for nurse visit

## 2017-07-08 ENCOUNTER — Encounter: Payer: Self-pay | Admitting: Internal Medicine

## 2017-07-08 ENCOUNTER — Ambulatory Visit (INDEPENDENT_AMBULATORY_CARE_PROVIDER_SITE_OTHER): Payer: PPO | Admitting: Internal Medicine

## 2017-07-08 VITALS — BP 120/78 | HR 78 | Temp 98.2°F | Wt 186.0 lb

## 2017-07-08 DIAGNOSIS — B0230 Zoster ocular disease, unspecified: Secondary | ICD-10-CM | POA: Diagnosis not present

## 2017-07-08 DIAGNOSIS — B028 Zoster with other complications: Secondary | ICD-10-CM | POA: Diagnosis not present

## 2017-07-08 MED ORDER — VALACYCLOVIR HCL 1 G PO TABS: 1000.0000 mg | ORAL_TABLET | Freq: Two times a day (BID) | ORAL | 0 refills | Status: DC

## 2017-07-08 NOTE — Progress Notes (Signed)
   Subjective:    Patient ID: Julia Solis, female    DOB: [birth year], 66 y.o.   MRN: [id number]  HPI 66 year old Female who went to the dentist on Friday, September 28. The following day she noticed a lesion below the right eyebrow that was erythematous and painful. No visual disturbance. Now her conjunctiva right eye is inflamed. She appears to have a new lesion above eyebrow that is erythematous.    Review of Systems see above     Objective:   Physical Exam She is afebrile. Extraocular movements are full. PERRLA.       Assessment & Plan:  Probable Herpes zoster  Plan: Valtrex 1 g 3 times a day for 7 days. Ophthalmology consultation.

## 2017-07-08 NOTE — Patient Instructions (Signed)
Valtrex 1 gram 3 times a day x 7 days. To see opthalmologist this afternoon.

## 2017-07-09 ENCOUNTER — Telehealth: Payer: Self-pay | Admitting: Internal Medicine

## 2017-07-09 NOTE — Telephone Encounter (Signed)
Spoke with patient in follow up to her appointment on 10/4.  Patient did see Dr. Prudencio Burly at [facility].  She does not have shingles in the eye at this point.  He did prescribe an ointment that she can use around the eye that is safe for the eye.  Told her to use Neosporin on the other lesions.  She said she feels like a mack truck hit her today.  Has a headache and body aches.  Advised she can take Tylenol or Aleve for these symptoms and to get plenty of rest.    Instructed patient to please call us back if she gets worse.  Patient verbalized understanding of these instructions.

## 2017-08-23 ENCOUNTER — Other Ambulatory Visit: Payer: Self-pay | Admitting: Internal Medicine

## 2017-09-16 ENCOUNTER — Other Ambulatory Visit: Payer: Self-pay

## 2017-09-16 MED ORDER — MELOXICAM 15 MG PO TABS: 15.0000 mg | ORAL_TABLET | Freq: Every day | ORAL | 1 refills | Status: DC

## 2017-10-22 ENCOUNTER — Encounter: Payer: Self-pay | Admitting: Internal Medicine

## 2017-10-22 DIAGNOSIS — Z1231 Encounter for screening mammogram for malignant neoplasm of breast: Secondary | ICD-10-CM | POA: Diagnosis not present

## 2018-02-22 DIAGNOSIS — D2261 Melanocytic nevi of right upper limb, including shoulder: Secondary | ICD-10-CM | POA: Diagnosis not present

## 2018-02-22 DIAGNOSIS — L738 Other specified follicular disorders: Secondary | ICD-10-CM | POA: Diagnosis not present

## 2018-02-22 DIAGNOSIS — L84 Corns and callosities: Secondary | ICD-10-CM | POA: Diagnosis not present

## 2018-02-22 DIAGNOSIS — L300 Nummular dermatitis: Secondary | ICD-10-CM | POA: Diagnosis not present

## 2018-02-22 DIAGNOSIS — D235 Other benign neoplasm of skin of trunk: Secondary | ICD-10-CM | POA: Diagnosis not present

## 2018-02-22 DIAGNOSIS — L821 Other seborrheic keratosis: Secondary | ICD-10-CM | POA: Diagnosis not present

## 2018-02-22 DIAGNOSIS — D2262 Melanocytic nevi of left upper limb, including shoulder: Secondary | ICD-10-CM | POA: Diagnosis not present

## 2018-02-22 DIAGNOSIS — Z85828 Personal history of other malignant neoplasm of skin: Secondary | ICD-10-CM | POA: Diagnosis not present

## 2018-02-22 DIAGNOSIS — D2272 Melanocytic nevi of left lower limb, including hip: Secondary | ICD-10-CM | POA: Diagnosis not present

## 2018-04-25 ENCOUNTER — Encounter: Payer: Self-pay | Admitting: Internal Medicine

## 2018-04-25 ENCOUNTER — Ambulatory Visit (INDEPENDENT_AMBULATORY_CARE_PROVIDER_SITE_OTHER): Payer: PPO | Admitting: Internal Medicine

## 2018-04-25 VITALS — BP 160/98 | HR 108 | Temp 98.1°F | Wt 186.0 lb

## 2018-04-25 DIAGNOSIS — Z8543 Personal history of malignant neoplasm of ovary: Secondary | ICD-10-CM | POA: Diagnosis not present

## 2018-04-25 DIAGNOSIS — R8290 Unspecified abnormal findings in urine: Secondary | ICD-10-CM | POA: Diagnosis not present

## 2018-04-25 DIAGNOSIS — R319 Hematuria, unspecified: Secondary | ICD-10-CM | POA: Diagnosis not present

## 2018-04-25 LAB — URINALYSIS, MICROSCOPIC ONLY
Bacteria, UA: NONE SEEN /HPF
Hyaline Cast: NONE SEEN /LPF
SQUAMOUS EPITHELIAL / LPF: NONE SEEN /HPF (ref <=5)

## 2018-04-25 LAB — POCT URINALYSIS DIPSTICK
BILIRUBIN UA: NEGATIVE
Glucose, UA: NEGATIVE
Ketones, UA: NEGATIVE
Nitrite, UA: NEGATIVE
PH UA: 7.5 (ref 5.0–8.0)
PROTEIN UA: NEGATIVE
Spec Grav, UA: 1.01 (ref 1.010–1.025)
UROBILINOGEN UA: 0.2 U/dL

## 2018-04-25 NOTE — Progress Notes (Signed)
   Subjective:    Patient ID: Julia Solis, female    DOB: [birth year], 67 y.o.   MRN: [id number]  HPI 67 year old Female with history of ovarian cancer status post hysterectomy BSO a number of years ago in today with complaint of painless hematuria.  Several days ago no discolored urine but more recently has noticed a reddish urine.  Urine specimen today shows large occult blood and trace LE.  She has no dysuria.  No back pain.  No fever or chills.  Quit smoking over 40 years ago.  She has become anxious because she read online that she might have bladder cancer.  Her daughter is getting a next weekend and then she is going out of town for a week herself.    Review of Systems see above     Objective:   Physical Exam  No CVA tenderness.  Urine specimen sent for culture.      Assessment & Plan:  Painless hematuria  Plan: Culture pending.  If negative, consider CT of the kidneys for further evaluation of hematuria and Urology consultation.

## 2018-04-25 NOTE — Addendum Note (Signed)
Addended by: Mady Haagensen on: 04/25/2018 10:57 AM   Modules accepted: Orders

## 2018-04-25 NOTE — Patient Instructions (Signed)
Urine culture pending with further instructions to follow.

## 2018-04-27 LAB — URINE CULTURE
MICRO NUMBER:: 90868498
RESULT: NO GROWTH
SPECIMEN QUALITY: ADEQUATE

## 2018-05-26 DIAGNOSIS — R310 Gross hematuria: Secondary | ICD-10-CM | POA: Diagnosis not present

## 2018-06-03 ENCOUNTER — Encounter: Payer: Self-pay | Admitting: Internal Medicine

## 2018-06-03 DIAGNOSIS — R310 Gross hematuria: Secondary | ICD-10-CM | POA: Diagnosis not present

## 2018-06-03 DIAGNOSIS — N202 Calculus of kidney with calculus of ureter: Secondary | ICD-10-CM | POA: Diagnosis not present

## 2018-06-07 DIAGNOSIS — R310 Gross hematuria: Secondary | ICD-10-CM | POA: Diagnosis not present

## 2018-06-07 DIAGNOSIS — R590 Localized enlarged lymph nodes: Secondary | ICD-10-CM | POA: Diagnosis not present

## 2018-06-07 DIAGNOSIS — N202 Calculus of kidney with calculus of ureter: Secondary | ICD-10-CM | POA: Diagnosis not present

## 2018-06-07 DIAGNOSIS — N132 Hydronephrosis with renal and ureteral calculous obstruction: Secondary | ICD-10-CM | POA: Diagnosis not present

## 2018-06-08 ENCOUNTER — Telehealth: Payer: Self-pay | Admitting: Internal Medicine

## 2018-06-08 DIAGNOSIS — Z8543 Personal history of malignant neoplasm of ovary: Secondary | ICD-10-CM | POA: Diagnosis not present

## 2018-06-08 NOTE — Telephone Encounter (Signed)
Patient was seen by [facility]; she has kidney stones.  She's currently taking Flomax.  She is wanting to take the Shingrix vaccine at her pharmacy.  She wants to know if she's ok to go ahead and take that shot while she's on Flomax?  Or, are there side effects that would cause her to need to wait until after she stops the Flomax?  Also, she had CT Scan on 8/30 at [facility] that showed some enlarged Lymph Nodes, she wanted you to be aware of this.  She was seen in follow up yesterday there.  I called over to [facility] this morning and asked them to fax both the CT and 9/3 OV note over so that you could review both of those notes.    Phone:  [phone]  Thank you.

## 2018-06-08 NOTE — Telephone Encounter (Signed)
Spoke with pt by phone. She is at Dr. Sanjuana Mae office to get ovarian cancer marker drawn. [facility] is not on her insurance plan she says. She will be contacting Dr. Rhodia Albright at [facility]. Advised defer Shingrix vaccine until kidney stone issue resolved.

## 2018-06-13 ENCOUNTER — Other Ambulatory Visit: Payer: Self-pay | Admitting: Urology

## 2018-06-13 DIAGNOSIS — N202 Calculus of kidney with calculus of ureter: Secondary | ICD-10-CM | POA: Diagnosis not present

## 2018-06-21 ENCOUNTER — Other Ambulatory Visit: Payer: Self-pay | Admitting: Urology

## 2018-06-22 ENCOUNTER — Other Ambulatory Visit: Payer: Self-pay | Admitting: Urology

## 2018-06-22 MED ORDER — SULFAMETHOXAZOLE-TRIMETHOPRIM 800-160 MG PO TABS: 1.0000 | ORAL_TABLET | Freq: Two times a day (BID) | ORAL | Status: DC

## 2018-06-23 ENCOUNTER — Other Ambulatory Visit: Payer: Self-pay

## 2018-06-23 ENCOUNTER — Encounter (HOSPITAL_COMMUNITY): Payer: Self-pay | Admitting: *Deleted

## 2018-06-23 ENCOUNTER — Ambulatory Visit (HOSPITAL_COMMUNITY): Payer: PPO

## 2018-06-23 ENCOUNTER — Encounter (HOSPITAL_COMMUNITY)
Admission: RE | Disposition: A | Discharge status: Home / Self Care | Payer: Self-pay | Source: Ambulatory Visit | Attending: Urology

## 2018-06-23 ENCOUNTER — Ambulatory Visit (HOSPITAL_COMMUNITY)
Admission: RE | Admit: 2018-06-23 | Discharge: 2018-06-23 | Disposition: A | Discharge status: Home / Self Care | Payer: PPO | Source: Ambulatory Visit | Attending: Urology | Admitting: Urology

## 2018-06-23 DIAGNOSIS — Z7951 Long term (current) use of inhaled steroids: Secondary | ICD-10-CM | POA: Insufficient documentation

## 2018-06-23 DIAGNOSIS — Z01818 Encounter for other preprocedural examination: Secondary | ICD-10-CM | POA: Diagnosis not present

## 2018-06-23 DIAGNOSIS — Z6831 Body mass index (BMI) 31.0-31.9, adult: Secondary | ICD-10-CM | POA: Diagnosis not present

## 2018-06-23 DIAGNOSIS — E669 Obesity, unspecified: Secondary | ICD-10-CM | POA: Insufficient documentation

## 2018-06-23 DIAGNOSIS — N201 Calculus of ureter: Secondary | ICD-10-CM | POA: Insufficient documentation

## 2018-06-23 DIAGNOSIS — F419 Anxiety disorder, unspecified: Secondary | ICD-10-CM | POA: Insufficient documentation

## 2018-06-23 DIAGNOSIS — Z8543 Personal history of malignant neoplasm of ovary: Secondary | ICD-10-CM | POA: Insufficient documentation

## 2018-06-23 DIAGNOSIS — Z79899 Other long term (current) drug therapy: Secondary | ICD-10-CM | POA: Insufficient documentation

## 2018-06-23 HISTORY — PX: EXTRACORPOREAL SHOCK WAVE LITHOTRIPSY: SHX1557

## 2018-06-23 HISTORY — DX: Other specified postprocedural states: Z98.890

## 2018-06-23 HISTORY — DX: Personal history of antineoplastic chemotherapy: Z92.21

## 2018-06-23 HISTORY — DX: Personal history of urinary calculi: Z87.442

## 2018-06-23 HISTORY — DX: Other complications of anesthesia, initial encounter: T88.59XA

## 2018-06-23 HISTORY — DX: Adverse effect of unspecified anesthetic, initial encounter: T41.45XA

## 2018-06-23 HISTORY — DX: Nausea with vomiting, unspecified: R11.2

## 2018-06-23 SURGERY — LITHOTRIPSY, ESWL
Anesthesia: LOCAL | Laterality: Left

## 2018-06-23 MED ORDER — DIPHENHYDRAMINE HCL 25 MG PO CAPS
25.0000 mg | ORAL_CAPSULE | ORAL | Status: AC
  Administered 2018-06-23: 25 mg via ORAL
  Filled 2018-06-23: qty 1

## 2018-06-23 MED ORDER — DIAZEPAM 5 MG PO TABS
10.0000 mg | ORAL_TABLET | ORAL | Status: AC
  Administered 2018-06-23: 10 mg via ORAL
  Filled 2018-06-23: qty 2

## 2018-06-23 MED ORDER — SODIUM CHLORIDE 0.9 % IV SOLN
INTRAVENOUS | Status: DC
  Administered 2018-06-23: 08:00:00 via INTRAVENOUS

## 2018-06-23 NOTE — Progress Notes (Signed)
Spoke with Dr Noah Delaine in regards to no antibiotic order/epic; MD stated no need for antibiotic order.

## 2018-06-23 NOTE — Discharge Instructions (Signed)
Lithotripsy, Care After °This sheet gives you information about how to care for yourself after your procedure. Your health care provider may also give you more specific instructions. If you have problems or questions, contact your health care provider. °What can I expect after the procedure? °After the procedure, it is common to have: °· Some blood in your urine. This should only last for a few days. °· Soreness in your back, sides, or upper abdomen for a few days. °· Blotches or bruises on your back where the pressure wave entered the skin. °· Pain, discomfort, or nausea when pieces (fragments) of the kidney stone move through the tube that carries urine from the kidney to the bladder (ureter). Stone fragments may pass soon after the procedure, but they may continue to pass for up to 4-8 weeks. °? If you have severe pain or nausea, contact your health care provider. This may be caused by a large stone that was not broken up, and this may mean that you need more treatment. °· Some pain or discomfort during urination. °· Some pain or discomfort in the lower abdomen or (in men) at the base of the penis. ° °Follow these instructions at home: °Medicines °· Take over-the-counter and prescription medicines only as told by your health care provider. °· If you were prescribed an antibiotic medicine, take it as told by your health care provider. Do not stop taking the antibiotic even if you start to feel better. °· Do not drive for 24 hours if you were given a medicine to help you relax (sedative). °· Do not drive or use heavy machinery while taking prescription pain medicine. °Eating and drinking °· Drink enough water and fluids to keep your urine clear or pale yellow. This helps any remaining pieces of the stone to pass. It can also help prevent new stones from forming. °· Eat plenty of fresh fruits and vegetables. °· Follow instructions from your health care provider about eating and drinking restrictions. You may be  instructed: °? To reduce how much salt (sodium) you eat or drink. Check ingredients and nutrition facts on packaged foods and beverages. °? To reduce how much meat you eat. °· Eat the recommended amount of calcium for your age and gender. Ask your health care provider how much calcium you should have. °General instructions °· Get plenty of rest. °· Most people can resume normal activities 1-2 days after the procedure. Ask your health care provider what activities are safe for you. °· If directed, strain all urine through the strainer that was provided by your health care provider. °? Keep all fragments for your health care provider to see. Any stones that are found may be sent to a medical lab for examination. The stone may be as small as a grain of salt. °· Keep all follow-up visits as told by your health care provider. This is important. °Contact a health care provider if: °· You have pain that is severe or does not get better with medicine. °· You have nausea that is severe or does not go away. °· You have blood in your urine longer than your health care provider told you to expect. °· You have more blood in your urine. °· You have pain during urination that does not go away. °· You urinate more frequently than usual and this does not go away. °· You develop a rash or any other possible signs of an allergic reaction. °Get help right away if: °· You have severe pain in   your back, sides, or upper abdomen. °· You have severe pain while urinating. °· Your urine is very dark red. °· You have blood in your stool (feces). °· You cannot pass any urine at all. °· You feel a strong urge to urinate after emptying your bladder. °· You have a fever or chills. °· You develop shortness of breath, difficulty breathing, or chest pain. °· You have severe nausea that leads to persistent vomiting. °· You faint. °Summary °· After this procedure, it is common to have some pain, discomfort, or nausea when pieces (fragments) of the  kidney stone move through the tube that carries urine from the kidney to the bladder (ureter). If this pain or nausea is severe, however, you should contact your health care provider. °· Most people can resume normal activities 1-2 days after the procedure. Ask your health care provider what activities are safe for you. °· Drink enough water and fluids to keep your urine clear or pale yellow. This helps any remaining pieces of the stone to pass, and it can help prevent new stones from forming. °· If directed, strain your urine and keep all fragments for your health care provider to see. Fragments or stones may be as small as a grain of salt. °· Get help right away if you have severe pain in your back, sides, or upper abdomen or have severe pain while urinating. °This information is not intended to replace advice given to you by your health care provider. Make sure you discuss any questions you have with your health care provider. °Document Released: 10/11/2007 Document Revised: 08/12/2016 Document Reviewed: 08/12/2016 °Elsevier Interactive Patient Education © 2018 [employer] ° °

## 2018-06-27 ENCOUNTER — Encounter (HOSPITAL_COMMUNITY): Payer: Self-pay | Admitting: Urology

## 2018-06-30 DIAGNOSIS — Z8543 Personal history of malignant neoplasm of ovary: Secondary | ICD-10-CM | POA: Diagnosis not present

## 2018-06-30 DIAGNOSIS — C561 Malignant neoplasm of right ovary: Secondary | ICD-10-CM | POA: Diagnosis not present

## 2018-06-30 DIAGNOSIS — Z9221 Personal history of antineoplastic chemotherapy: Secondary | ICD-10-CM | POA: Diagnosis not present

## 2018-06-30 DIAGNOSIS — R1900 Intra-abdominal and pelvic swelling, mass and lump, unspecified site: Secondary | ICD-10-CM | POA: Diagnosis not present

## 2018-07-01 ENCOUNTER — Other Ambulatory Visit: Payer: Self-pay | Admitting: Internal Medicine

## 2018-07-01 DIAGNOSIS — N201 Calculus of ureter: Secondary | ICD-10-CM | POA: Diagnosis not present

## 2018-07-01 DIAGNOSIS — G4709 Other insomnia: Secondary | ICD-10-CM

## 2018-07-01 DIAGNOSIS — E559 Vitamin D deficiency, unspecified: Secondary | ICD-10-CM

## 2018-07-01 DIAGNOSIS — H905 Unspecified sensorineural hearing loss: Secondary | ICD-10-CM

## 2018-07-01 DIAGNOSIS — C569 Malignant neoplasm of unspecified ovary: Secondary | ICD-10-CM

## 2018-07-01 DIAGNOSIS — M545 Low back pain, unspecified: Secondary | ICD-10-CM

## 2018-07-01 DIAGNOSIS — E7800 Pure hypercholesterolemia, unspecified: Secondary | ICD-10-CM

## 2018-07-05 ENCOUNTER — Other Ambulatory Visit: Payer: PPO | Admitting: Internal Medicine

## 2018-07-05 DIAGNOSIS — M545 Low back pain, unspecified: Secondary | ICD-10-CM

## 2018-07-05 DIAGNOSIS — E7800 Pure hypercholesterolemia, unspecified: Secondary | ICD-10-CM

## 2018-07-05 DIAGNOSIS — C569 Malignant neoplasm of unspecified ovary: Secondary | ICD-10-CM

## 2018-07-05 DIAGNOSIS — G4709 Other insomnia: Secondary | ICD-10-CM

## 2018-07-05 DIAGNOSIS — E559 Vitamin D deficiency, unspecified: Secondary | ICD-10-CM

## 2018-07-05 DIAGNOSIS — H905 Unspecified sensorineural hearing loss: Secondary | ICD-10-CM

## 2018-07-05 LAB — CBC WITH DIFFERENTIAL/PLATELET
BASOS ABS: 32 {cells}/uL (ref 0–200)
BASOS PCT: 0.6 %
EOS PCT: 4.1 %
Eosinophils Absolute: 217 cells/uL (ref 15–500)
HCT: 38.1 % (ref 35.0–45.0)
HEMOGLOBIN: 12.7 g/dL (ref 11.7–15.5)
Lymphs Abs: 1526 cells/uL (ref 850–3900)
MCH: 28.6 pg (ref 27.0–33.0)
MCHC: 33.3 g/dL (ref 32.0–36.0)
MCV: 85.8 fL (ref 80.0–100.0)
MONOS PCT: 9.8 %
MPV: 10.9 fL (ref 7.5–12.5)
NEUTROS ABS: 3005 {cells}/uL (ref 1500–7800)
Neutrophils Relative %: 56.7 %
Platelets: 218 10*3/uL (ref 140–400)
RBC: 4.44 10*6/uL (ref 3.80–5.10)
RDW: 13 % (ref 11.0–15.0)
Total Lymphocyte: 28.8 %
WBC: 5.3 10*3/uL (ref 3.8–10.8)
WBCMIX: 519 {cells}/uL (ref 200–950)

## 2018-07-05 LAB — LIPID PANEL
Cholesterol: 240 mg/dL — ABNORMAL HIGH (ref <200)
HDL: 62 mg/dL (ref >50)
LDL Cholesterol (Calc): 162 mg/dL (calc) — ABNORMAL HIGH
NON-HDL CHOLESTEROL (CALC): 178 mg/dL — AB (ref <130)
TRIGLYCERIDES: 66 mg/dL (ref <150)
Total CHOL/HDL Ratio: 3.9 (calc) (ref <5.0)

## 2018-07-05 LAB — COMPLETE METABOLIC PANEL WITH GFR
AG RATIO: 1.7 (calc) (ref 1.0–2.5)
ALT: 6 U/L (ref 6–29)
AST: 12 U/L (ref 10–35)
Albumin: 4 g/dL (ref 3.6–5.1)
Alkaline phosphatase (APISO): 86 U/L (ref 33–130)
BUN: 17 mg/dL (ref 7–25)
CALCIUM: 9.1 mg/dL (ref 8.6–10.4)
CO2: 26 mmol/L (ref 20–32)
CREATININE: 0.69 mg/dL (ref 0.50–0.99)
Chloride: 104 mmol/L (ref 98–110)
GFR, EST AFRICAN AMERICAN: 105 mL/min/{1.73_m2} (ref >=60)
GFR, EST NON AFRICAN AMERICAN: 91 mL/min/{1.73_m2} (ref >=60)
GLOBULIN: 2.3 g/dL (ref 1.9–3.7)
Glucose, Bld: 93 mg/dL (ref 65–99)
Potassium: 4 mmol/L (ref 3.5–5.3)
SODIUM: 139 mmol/L (ref 135–146)
TOTAL PROTEIN: 6.3 g/dL (ref 6.1–8.1)
Total Bilirubin: 0.4 mg/dL (ref 0.2–1.2)

## 2018-07-05 LAB — TSH: TSH: 2.23 m[IU]/L (ref 0.40–4.50)

## 2018-07-08 ENCOUNTER — Encounter: Payer: Self-pay | Admitting: Internal Medicine

## 2018-07-08 ENCOUNTER — Ambulatory Visit (INDEPENDENT_AMBULATORY_CARE_PROVIDER_SITE_OTHER): Payer: PPO | Admitting: Internal Medicine

## 2018-07-08 VITALS — BP 120/80 | HR 94 | Ht 64.0 in | Wt 185.0 lb

## 2018-07-08 DIAGNOSIS — N809 Endometriosis, unspecified: Secondary | ICD-10-CM | POA: Diagnosis not present

## 2018-07-08 DIAGNOSIS — E7800 Pure hypercholesterolemia, unspecified: Secondary | ICD-10-CM

## 2018-07-08 DIAGNOSIS — Z0000 Encounter for general adult medical examination without abnormal findings: Secondary | ICD-10-CM

## 2018-07-08 DIAGNOSIS — N840 Polyp of corpus uteri: Secondary | ICD-10-CM | POA: Diagnosis not present

## 2018-07-08 DIAGNOSIS — R200 Anesthesia of skin: Secondary | ICD-10-CM

## 2018-07-08 DIAGNOSIS — Z8543 Personal history of malignant neoplasm of ovary: Secondary | ICD-10-CM | POA: Diagnosis not present

## 2018-07-08 DIAGNOSIS — N200 Calculus of kidney: Secondary | ICD-10-CM

## 2018-07-08 DIAGNOSIS — R591 Generalized enlarged lymph nodes: Secondary | ICD-10-CM | POA: Diagnosis not present

## 2018-07-08 DIAGNOSIS — R208 Other disturbances of skin sensation: Secondary | ICD-10-CM

## 2018-07-08 DIAGNOSIS — D259 Leiomyoma of uterus, unspecified: Secondary | ICD-10-CM | POA: Diagnosis not present

## 2018-07-08 DIAGNOSIS — G4709 Other insomnia: Secondary | ICD-10-CM | POA: Diagnosis not present

## 2018-07-08 DIAGNOSIS — H905 Unspecified sensorineural hearing loss: Secondary | ICD-10-CM

## 2018-07-08 DIAGNOSIS — E559 Vitamin D deficiency, unspecified: Secondary | ICD-10-CM | POA: Diagnosis not present

## 2018-07-08 DIAGNOSIS — C561 Malignant neoplasm of right ovary: Secondary | ICD-10-CM | POA: Diagnosis not present

## 2018-07-08 MED ORDER — LORAZEPAM 1 MG PO TABS: ORAL_TABLET | ORAL | 0 refills | Status: DC

## 2018-07-08 NOTE — Patient Instructions (Addendum)
Ativan refilled as requested. RTC next week for Pneumococcal 23 vaccine. Have high dose flu vaccine at pharmacy.  She is to see Dr. Rhodia Albright in follow-up in a couple of months.  Needs to work on diet and exercise.  Take vitamin D supplement daily

## 2018-07-08 NOTE — Progress Notes (Signed)
Subjective:    Patient ID: Julia Solis, female    DOB: 04-20-1951, 67 y.o.   MRN: [id number]  HPI 67 year old Female for health maintenance exam and evaluation of medical issues.  Was evaluated recently by oncologist at [facility] after CT scan for gross hematuria showed a renal stone as well as lymphadenopathy.  She was seen by her primary OB/GYN.  Ca1 25 was normal.  She saw Dr. Rhodia Albright at [facility] who felt she was doing well and had most likely had no recurrence of ovarian cancer which was diagnosed in 2011.  She needed lithotripsy for treatment of stone.  History of ovarian cancer status post hysterectomy and BSO 2011.  Dr. Rhodia Albright recommended repeat CT scan and CA 125 in 2 months.  History of lumbar disc disease status post surgery by Dr. [name] in 1997.  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 [name].  She had total abdominal hysterectomy BSO and bilateral pelvic node dissection.  Had aortic node sampling, omentectomy, appendectomy and biopsies.  Lymph nodes were negative for malignancy.  Symptoms started February 2011 with abdominal gas and bloating.  CT of the chest showed tiny nodules thought to be benign.  She has seen Dr. Melvyn Novas for these.  Additional past medical history: Ectopic pregnancy 1980, benign left breast biopsy 1993.  [place].  Ganglion cyst removed from wrist 1975.  Tonsillectomy 1957.  B[place] 2008.  Social history: She is divorced and has 2 adult daughters.  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  Works about 3 days a week.  Does not smoke but did smoke from the ages of 43 through 47.  Social alcohol consumption.  Has had some issues with insomnia and has tried Ambien and lorazepam.  Colonoscopy done September 2012 by Dr. Earle Gell which was apparently  normal.  Tetanus immunization is up-to-date until 2020.  History of mild vitamin D deficiency  Family history: Father died at age 51 with prostate cancer.  Brother died at age 68 of an AVM rupture.  One sister in good health.  Subjective:   Patient presents for Medicare Annual/Subsequent preventive examination.  Review Past Medical/Family/Social: See above  Risk Factors  Current exercise habits: Probably needs to walk a bit more Dietary issues discussed: Low-fat low carbohydrate  Cardiac risk factors: Hyperlipidemia  Depression Screen  (Note: if answer to either of the following is "Yes", a more complete depression screening is indicated)   Over the past two weeks, have you felt down, depressed or hopeless? No  Over the past two weeks, have you felt little interest or pleasure in doing things? No Have you lost interest or pleasure in daily life? No Do you often feel hopeless? No Do you cry easily over simple problems? No   Activities of Daily Living  In your present state of health, do you have any difficulty performing the following activities?:   Driving? No  Managing money? No  Feeding yourself? No  Getting from bed to chair? No  Climbing a flight of stairs? No  Preparing food and eating?: No  Bathing or showering? No  Getting dressed: No  Getting to the toilet? No  Using the toilet:No  Moving around from place to place: No  In the past year have you fallen or had a near fall?:No  Are you sexually active? No  Do you have more than one partner? No   Hearing Difficulties: Yes wears bilateral hearing aids Do you often ask people to speak up or repeat themselves? No  Do you experience ringing or noises in your ears? No  Do you have difficulty understanding soft or whispered voices? No  Do you feel that you have a problem with memory? No Do you often misplace items? No    Home Safety:  Do you have a smoke alarm at your residence? Yes Do you have grab bars in the  bathroom?  Yes Do you have throw rugs in your house?  Yes   Cognitive Testing  Alert? Yes Normal Appearance?Yes  Oriented to person? Yes Place? Yes  Time? Yes  Recall of three objects? Yes  Can perform simple calculations? Yes  Displays appropriate judgment?Yes  Can read the correct time from a watch face?Yes   List the Names of Other Physician/Practitioners you currently use:  See referral list for the physicians patient is currently seeing.     Review of Systems: See above   Objective:     General appearance: Appears stated age and mildly obese  Head: Normocephalic, without obvious abnormality, atraumatic  Eyes: conj clear, EOMi PEERLA  Ears: normal TM's and external ear canals both ears  Nose: Nares normal. Septum midline. Mucosa normal. No drainage or sinus tenderness.  Throat: lips, mucosa, and tongue normal; teeth and gums normal  Neck: no adenopathy, no carotid bruit, no JVD, supple, symmetrical, trachea midline and thyroid not enlarged, symmetric, no tenderness/mass/nodules  No CVA tenderness.  Lungs: clear to auscultation bilaterally  Breasts: normal appearance, no masses or tenderness, top of the pacemaker on left upper chest. Incision well-healed. It is tender.  Heart: regular rate and rhythm, S1, S2 normal, no murmur, click, rub or gallop  Abdomen: soft, non-tender; bowel sounds normal; no masses, no organomegaly  Musculoskeletal: ROM normal in all joints, no crepitus, no deformity, Normal muscle strengthen. Back  is symmetric, no curvature. Skin: Skin color, texture, turgor normal. No rashes or lesions  Lymph nodes: Cervical, supraclavicular, and axillary nodes normal.  Neurologic: CN 2 -12 Normal, Normal symmetric reflexes. Normal coordination and gait  Psych: Alert & Oriented x 3, Mood appear stable.    Assessment:    Annual wellness medicare exam   Plan:    During the course of the visit the patient was educated and counseled about appropriate  screening and preventive services including:   Annual flu vaccine  Annual mammogram     Patient Instructions (the written plan) was given to the patient.  Medicare Attestation  I have personally reviewed:  The patient's medical and social history  Their use of alcohol, tobacco or illicit drugs  Their current medications and supplements  The patient's functional ability including ADLs,fall risks, home safety risks, cognitive, and hearing and visual impairment  Diet and physical activities  Evidence for depression or mood disorders  The patient's weight, height, BMI, and visual acuity have been recorded in the chart. I have made referrals, counseling, and provided education to the patient based on review of the above and I have provided the patient with a written personalized care plan for preventive services.                 Review of Systems     Objective:   Physical Exam        Assessment & Plan:

## 2018-07-11 ENCOUNTER — Ambulatory Visit (INDEPENDENT_AMBULATORY_CARE_PROVIDER_SITE_OTHER): Payer: PPO | Admitting: Internal Medicine

## 2018-07-11 VITALS — BP 130/90 | HR 76 | Temp 98.1°F

## 2018-07-11 DIAGNOSIS — Z23 Encounter for immunization: Secondary | ICD-10-CM | POA: Diagnosis not present

## 2018-07-11 LAB — POCT URINALYSIS DIPSTICK
Appearance: NORMAL
Bilirubin, UA: NEGATIVE
Glucose, UA: NEGATIVE
KETONES UA: NEGATIVE
LEUKOCYTES UA: NEGATIVE
Nitrite, UA: NEGATIVE
Odor: NORMAL
PROTEIN UA: NEGATIVE
RBC UA: NEGATIVE
SPEC GRAV UA: 1.01 (ref 1.010–1.025)
Urobilinogen, UA: 0.2 E.U./dL
pH, UA: 7.5 (ref 5.0–8.0)

## 2018-07-11 NOTE — Progress Notes (Signed)
   Subjective:    Patient ID: Julia Solis, female    DOB: 1951-05-21, 67 y.o.   MRN: [id number]  HPI pneumovax 23 given by CMA today    Review of Systems     Objective:   Physical Exam        Assessment & Plan:

## 2018-07-11 NOTE — Patient Instructions (Signed)
Patient received a PNEUMOVAX 23 IM R deltoid. AV, CMA

## 2018-07-20 DIAGNOSIS — N200 Calculus of kidney: Secondary | ICD-10-CM | POA: Diagnosis not present

## 2018-07-22 DIAGNOSIS — M1711 Unilateral primary osteoarthritis, right knee: Secondary | ICD-10-CM | POA: Diagnosis not present

## 2018-08-04 ENCOUNTER — Encounter: Payer: Self-pay | Admitting: Internal Medicine

## 2018-08-23 ENCOUNTER — Ambulatory Visit (INDEPENDENT_AMBULATORY_CARE_PROVIDER_SITE_OTHER): Payer: PPO

## 2018-08-23 ENCOUNTER — Other Ambulatory Visit: Payer: Self-pay | Admitting: Internal Medicine

## 2018-08-23 DIAGNOSIS — R002 Palpitations: Secondary | ICD-10-CM

## 2018-09-08 DIAGNOSIS — C561 Malignant neoplasm of right ovary: Secondary | ICD-10-CM | POA: Diagnosis not present

## 2018-09-08 DIAGNOSIS — D739 Disease of spleen, unspecified: Secondary | ICD-10-CM | POA: Diagnosis not present

## 2018-09-08 DIAGNOSIS — D7389 Other diseases of spleen: Secondary | ICD-10-CM | POA: Diagnosis not present

## 2018-09-08 DIAGNOSIS — R590 Localized enlarged lymph nodes: Secondary | ICD-10-CM | POA: Diagnosis not present

## 2018-09-26 ENCOUNTER — Ambulatory Visit (INDEPENDENT_AMBULATORY_CARE_PROVIDER_SITE_OTHER): Payer: PPO | Admitting: Internal Medicine

## 2018-09-26 ENCOUNTER — Encounter: Payer: Self-pay | Admitting: Internal Medicine

## 2018-09-26 VITALS — BP 120/80 | HR 78 | Temp 98.1°F | Ht 64.0 in | Wt 185.0 lb

## 2018-09-26 DIAGNOSIS — R0989 Other specified symptoms and signs involving the circulatory and respiratory systems: Secondary | ICD-10-CM | POA: Diagnosis not present

## 2018-09-26 DIAGNOSIS — Z8543 Personal history of malignant neoplasm of ovary: Secondary | ICD-10-CM | POA: Diagnosis not present

## 2018-09-26 DIAGNOSIS — I491 Atrial premature depolarization: Secondary | ICD-10-CM

## 2018-09-26 NOTE — Progress Notes (Signed)
   Subjective:    Patient ID: Julia Solis, female    DOB: 12-08-1950, 67 y.o.   MRN: [id number]  HPI 67 year old Female in today indicating that she is having fluctuations in her blood pressure.  Recently has been evaluated by GYN oncologist at [facility] and is to have a PET scan in the near future.  It seems unlikely that she has a recurrence of ovarian cancer and he feels that PET scan will be the determining factor in closing the work-up.  Understandably, patient is somewhat anxious.  She has been monitoring her blood pressure.  She has been taking it frequently since October.  Blood pressure ranges from 409 811 systolically and from 91-47 diastolically.  Pulse is generally in the 70s or 80s.  She is concerned about this.  She has a normal blood pressure cuff.  Likely needs to purchase a new one.  Blood pressure today here is excellent at 120/80 with pulse of 78.  I am not certain that she truly has essential hypertension.  I think we need some more information and she will purchase a new blood pressure cuff.  When she purchases her new monitor she will come back and mid January for evaluation.  She has an appointment see Dr. Rayann Heman January 6.  She recently had an EKG showing PACs.  Was having some palpitations.  24-hour Holter monitor showed very frequent PACs and infrequent PVCs.  She may need to be on low-dose beta-blocker to suppress the PACs.  However I would like for Dr. Rayann Heman to evaluate her.  She is anxious about her health.  Explained to her that the PACs are benign.    Review of Systems see above-TSH checked in October was 2.23.  History of mild hyperlipidemia with total cholesterol 240 and LDL cholesterol 162.     Objective:   Physical Exam Skin warm and dry.  Nodes none.  Neck is supple without thyromegaly.  Chest clear to auscultation.  Extremities without edema.  Cardiac exam occasional irregular contractions.       Assessment & Plan:    Frequent PACs  Occasional PVCs  History of ovarian cancer-to have PET scan by GYN oncologist in the near future to screen for very small possibility of recurrence  Anxiety  Elevated blood pressure at times with home monitor ?  Anxiety related versus monitor being old  Plan: She will follow-up with Dr. Rayann Heman soon to discuss these issues.  She may benefit from low-dose beta-blocker which would help her blood pressure and also suppress her PACs.  Tried to reassure her today.

## 2018-10-04 NOTE — Patient Instructions (Signed)
Keep appointment with Dr. Rayann Heman.  Please get your blood pressure monitor and follow-up with me in mid January.  Continue to monitor blood pressure at home.

## 2018-10-10 ENCOUNTER — Ambulatory Visit: Payer: PPO | Admitting: Internal Medicine

## 2018-10-10 ENCOUNTER — Encounter: Payer: Self-pay | Admitting: Internal Medicine

## 2018-10-10 VITALS — BP 136/80 | HR 80 | Ht 64.0 in | Wt 187.0 lb

## 2018-10-10 DIAGNOSIS — I493 Ventricular premature depolarization: Secondary | ICD-10-CM | POA: Diagnosis not present

## 2018-10-10 DIAGNOSIS — I491 Atrial premature depolarization: Secondary | ICD-10-CM

## 2018-10-10 NOTE — Progress Notes (Signed)
Electrophysiology Office Note   Date:  10/10/2018   ID:  Oral, Hallgren 1951/06/14, MRN [id number]  PCP:  Elby Showers, MD  Cardiologist:  none Primary Electrophysiologist: Thompson Grayer, MD    CC: palpitations   History of Present Illness: Julia Solis is a 68 y.o. female who presents today for electrophysiology evaluation.   She is referred by Dr Renold Genta for EP consultation regarding palpitations and PACs.  She has HTN.  Recent holter monitor (personally reviewed) reveals sinus rhythm with frequent PACs (11%) and infrequent PVCs.  No sustained arrhythmias were noted. She is mostly unaware but does have rare palpitations.  Today, she denies symptoms of chest pain, shortness of breath, orthopnea, PND, lower extremity edema, claudication, dizziness, presyncope, syncope, bleeding, or neurologic sequela. The patient is tolerating medications without difficulties and is otherwise without complaint today.    Past Medical History:  Diagnosis Date  . Cancer ([place]) 2011   ovarian  . Complication of anesthesia   . History of chemotherapy   . History of kidney stones   . PONV (postoperative nausea and vomiting)    Past Surgical History:  Procedure Laterality Date  . ABDOMINAL HYSTERECTOMY  2011  . BACK SURGERY  1997  . [place]  . COSMETIC SURGERY    . etopic preg    . EXTRACORPOREAL SHOCK WAVE LITHOTRIPSY Left 06/23/2018   Procedure: LEFT EXTRACORPOREAL SHOCK WAVE LITHOTRIPSY (ESWL);  Surgeon: Cleon Gustin, MD;  Location: [facility];  Service: Urology;  Laterality: Left;  . GANGLION CYST EXCISION  1975  . HAND SURGERY     right  . TONSILLECTOMY  1957     Current Outpatient Medications  Medication Sig Dispense Refill  . cholecalciferol (VITAMIN D) 1000 units tablet Take 1,000 Units by mouth daily.    . diphenhydrAMINE (BENADRYL) 25 MG tablet Take 25 mg by mouth every 6 (six) hours as needed.    Marland Kitchen ibuprofen (ADVIL,MOTRIN) 200 MG tablet Take 200  mg by mouth as needed. Reported on 11/13/2015    . Loratadine 10 MG CAPS Take 10 mg by mouth daily. Reported on 11/13/2015    . LORazepam (ATIVAN) 1 MG tablet One po qhs prn 30 tablet 0  . meloxicam (MOBIC) 15 MG tablet Take 1 tablet (15 mg total) by mouth daily. (Patient taking differently: Take 15 mg by mouth as needed. ) 90 tablet 1  . Polyethyl Glycol-Propyl Glycol (SYSTANE OP) Apply 1 drop to eye daily. Reported on 11/13/2015    . polyethylene glycol (MIRALAX / GLYCOLAX) packet Take 17 g by mouth as needed. Reported on 11/13/2015     No current facility-administered medications for this visit.     Allergies:   Flomax [tamsulosin hcl]; Latex; and Ciprofloxacin   Social History:  The patient  reports that she quit smoking about 42 years ago. Her smoking use included cigarettes. She has a 16.50 pack-year smoking history. She has never used smokeless tobacco. She reports current alcohol use. She reports that she does not use drugs.   Family History:  The patient's  family history includes Breast cancer in her cousin, cousin, cousin and another family member; Breast cancer (age of onset: 39) in her cousin; Breast cancer (age of onset: 35) in her paternal aunt; Breast cancer (age of onset: 64) in her cousin; Cancer in her father, maternal grandmother, and other family members; Colon cancer in her maternal grandmother, paternal aunt, and paternal grandmother; Colon polyps in her cousin and  sister; Heart disease in her maternal grandfather and paternal grandfather; Hypertension in her father and mother; Melanoma (age of onset: 45) in her cousin; Prostate cancer (age of onset: 33) in her father; Stroke in her father.    ROS:  Please see the history of present illness.   All other systems are personally reviewed and negative.    PHYSICAL EXAM: VS:  BP 136/80   Pulse 80   Ht 5\' 4"  (1.626 m)   Wt 187 lb (84.8 kg)   SpO2 99%   BMI 32.10 kg/m  , BMI Body mass index is 32.1 kg/m. GEN: Well nourished, well  developed, in no acute distress  HEENT: normal  Neck: no JVD, carotid bruits, or masses Cardiac: RRR; no murmurs, rubs, or gallops,no edema  Respiratory:  clear to auscultation bilaterally, normal work of breathing GI: soft, nontender, nondistended, + BS MS: no deformity or atrophy  Skin: warm and dry  Neuro:  Strength and sensation are intact Psych: euthymic mood, full affect  EKG:  EKG is ordered today. The ekg ordered today is personally reviewed and shows normal ekg   Recent Labs: 07/05/2018: ALT 6; BUN 17; Creat 0.69; Hemoglobin 12.7; Platelets 218; Potassium 4.0; Sodium 139; TSH 2.23  personally reviewed   Lipid Panel     Component Value Date/Time   CHOL 240 (H) 07/05/2018 1007   TRIG 66 07/05/2018 1007   HDL 62 07/05/2018 1007   CHOLHDL 3.9 07/05/2018 1007   VLDL 16 04/12/2017 1036   LDLCALC 162 (H) 07/05/2018 1007   personally reviewed   Wt Readings from Last 3 Encounters:  10/10/18 187 lb (84.8 kg)  09/26/18 185 lb (83.9 kg)  07/08/18 185 lb (83.9 kg)      Other studies personally reviewed: Additional studies/ records that were reviewed today include: Dr Verlene Mayer notes, prior holter, recent labs  Review of the above records today demonstrates: as above   ASSESSMENT AND PLAN:  1.  PACs Likely benign Minimally symptomatic Will obtain an echo.  If echo is normal, no further workup is planned   Follow-up:  As needed  Current medicines are reviewed at length with the patient today.   The patient does not have concerns regarding her medicines.  The following changes were made today:  none  Labs/ tests ordered today include:  Orders Placed This Encounter  Procedures  . EKG 12-Lead  . ECHOCARDIOGRAM COMPLETE     Signed, Thompson Grayer, MD  10/10/2018 4:03 PM     [facility] [address] [place] [address] [phone] (office) [phone] (fax)

## 2018-10-10 NOTE — Patient Instructions (Addendum)
Medication Instructions:  Your physician recommends that you continue on your current medications as directed. Please refer to the Current Medication list given to you today.  Labwork: None ordered.  Testing/Procedures: Your physician has requested that you have an echocardiogram. Echocardiography is a painless test that uses sound waves to create images of your heart. It provides your doctor with information about the size and shape of your heart and how well your heart's chambers and valves are working. This procedure takes approximately one hour. There are no restrictions for this procedure.  Please schedule for an ECHO  Follow-Up: Your physician wants you to follow-up in: as needed with Dr. Rayann Heman.      Any Other Special Instructions Will Be Listed Below (If Applicable).  If you need a refill on your cardiac medications before your next appointment, please call your pharmacy.

## 2018-10-20 ENCOUNTER — Other Ambulatory Visit: Payer: Self-pay

## 2018-10-20 ENCOUNTER — Ambulatory Visit (HOSPITAL_COMMUNITY): Payer: PPO | Attending: Cardiovascular Disease

## 2018-10-20 DIAGNOSIS — I491 Atrial premature depolarization: Secondary | ICD-10-CM | POA: Insufficient documentation

## 2018-10-21 ENCOUNTER — Encounter: Payer: Self-pay | Admitting: Internal Medicine

## 2018-10-21 ENCOUNTER — Ambulatory Visit (INDEPENDENT_AMBULATORY_CARE_PROVIDER_SITE_OTHER): Payer: PPO | Admitting: Internal Medicine

## 2018-10-21 VITALS — BP 130/80 | HR 84 | Ht 64.0 in | Wt 187.0 lb

## 2018-10-21 DIAGNOSIS — I491 Atrial premature depolarization: Secondary | ICD-10-CM | POA: Diagnosis not present

## 2018-10-21 NOTE — Patient Instructions (Signed)
BP normal. Continue to monitor. Correlates with home machine.

## 2018-10-21 NOTE — Progress Notes (Signed)
BP checked by CMA  and normal result obtained. Pt asking about Echo results.  Needs to wait for Cardiology to call her. Just had study yesterday. BP here correlates with home BP monitor.

## 2018-10-27 DIAGNOSIS — C7889 Secondary malignant neoplasm of other digestive organs: Secondary | ICD-10-CM | POA: Diagnosis not present

## 2018-10-27 DIAGNOSIS — C7989 Secondary malignant neoplasm of other specified sites: Secondary | ICD-10-CM | POA: Diagnosis not present

## 2018-10-27 DIAGNOSIS — C782 Secondary malignant neoplasm of pleura: Secondary | ICD-10-CM | POA: Diagnosis not present

## 2018-10-27 DIAGNOSIS — C7800 Secondary malignant neoplasm of unspecified lung: Secondary | ICD-10-CM | POA: Diagnosis not present

## 2018-10-27 DIAGNOSIS — C7951 Secondary malignant neoplasm of bone: Secondary | ICD-10-CM | POA: Diagnosis not present

## 2018-10-27 DIAGNOSIS — R590 Localized enlarged lymph nodes: Secondary | ICD-10-CM | POA: Diagnosis not present

## 2018-10-27 DIAGNOSIS — C787 Secondary malignant neoplasm of liver and intrahepatic bile duct: Secondary | ICD-10-CM | POA: Diagnosis not present

## 2018-10-27 DIAGNOSIS — C561 Malignant neoplasm of right ovary: Secondary | ICD-10-CM | POA: Diagnosis not present

## 2018-10-28 DIAGNOSIS — Z90710 Acquired absence of both cervix and uterus: Secondary | ICD-10-CM | POA: Diagnosis not present

## 2018-10-28 DIAGNOSIS — M8589 Other specified disorders of bone density and structure, multiple sites: Secondary | ICD-10-CM | POA: Diagnosis not present

## 2018-10-28 DIAGNOSIS — Z8262 Family history of osteoporosis: Secondary | ICD-10-CM | POA: Diagnosis not present

## 2018-10-28 DIAGNOSIS — Z8543 Personal history of malignant neoplasm of ovary: Secondary | ICD-10-CM | POA: Diagnosis not present

## 2018-10-28 DIAGNOSIS — Z1231 Encounter for screening mammogram for malignant neoplasm of breast: Secondary | ICD-10-CM | POA: Diagnosis not present

## 2018-10-28 LAB — HM MAMMOGRAPHY

## 2018-10-28 LAB — HM DEXA SCAN

## 2018-10-31 ENCOUNTER — Encounter: Payer: Self-pay | Admitting: Internal Medicine

## 2018-10-31 DIAGNOSIS — D869 Sarcoidosis, unspecified: Secondary | ICD-10-CM | POA: Insufficient documentation

## 2018-11-03 ENCOUNTER — Encounter: Payer: Self-pay | Admitting: Internal Medicine

## 2018-11-03 DIAGNOSIS — R922 Inconclusive mammogram: Secondary | ICD-10-CM | POA: Diagnosis not present

## 2018-11-03 DIAGNOSIS — N6002 Solitary cyst of left breast: Secondary | ICD-10-CM | POA: Diagnosis not present

## 2018-11-07 DIAGNOSIS — C561 Malignant neoplasm of right ovary: Secondary | ICD-10-CM | POA: Diagnosis not present

## 2018-11-07 DIAGNOSIS — M899 Disorder of bone, unspecified: Secondary | ICD-10-CM | POA: Diagnosis not present

## 2018-11-30 DIAGNOSIS — I881 Chronic lymphadenitis, except mesenteric: Secondary | ICD-10-CM | POA: Diagnosis not present

## 2018-11-30 DIAGNOSIS — R591 Generalized enlarged lymph nodes: Secondary | ICD-10-CM | POA: Diagnosis not present

## 2018-11-30 DIAGNOSIS — R918 Other nonspecific abnormal finding of lung field: Secondary | ICD-10-CM | POA: Diagnosis not present

## 2018-12-05 DIAGNOSIS — I270 Primary pulmonary hypertension: Secondary | ICD-10-CM | POA: Diagnosis not present

## 2018-12-12 DIAGNOSIS — R591 Generalized enlarged lymph nodes: Secondary | ICD-10-CM | POA: Diagnosis not present

## 2018-12-12 DIAGNOSIS — I272 Pulmonary hypertension, unspecified: Secondary | ICD-10-CM | POA: Diagnosis not present

## 2018-12-16 DIAGNOSIS — D869 Sarcoidosis, unspecified: Secondary | ICD-10-CM | POA: Diagnosis not present

## 2018-12-16 DIAGNOSIS — R590 Localized enlarged lymph nodes: Secondary | ICD-10-CM | POA: Diagnosis not present

## 2019-03-21 DIAGNOSIS — R918 Other nonspecific abnormal finding of lung field: Secondary | ICD-10-CM | POA: Diagnosis not present

## 2019-03-21 DIAGNOSIS — N200 Calculus of kidney: Secondary | ICD-10-CM | POA: Diagnosis not present

## 2019-03-21 DIAGNOSIS — D869 Sarcoidosis, unspecified: Secondary | ICD-10-CM | POA: Diagnosis not present

## 2019-04-20 ENCOUNTER — Telehealth: Payer: Self-pay

## 2019-04-20 NOTE — Telephone Encounter (Signed)
Patient called states she had a CT scan done of her neck at [facility] and she was advised that she needed a non-urgent US of her thyroid and she is wanting to go just before her CPE in October so you can discuss the results with her during her CPE. She would like to know if we can arrange this for her. She also wants to know if you have been getting CT scan results from [facility] she has had one in December, January, March and June.

## 2019-04-20 NOTE — Telephone Encounter (Signed)
This will be discussed at her appt when I have time to review her records and not before

## 2019-04-20 NOTE — Telephone Encounter (Signed)
Patient was notified.

## 2019-06-06 ENCOUNTER — Other Ambulatory Visit: Payer: Self-pay

## 2019-06-06 ENCOUNTER — Ambulatory Visit (INDEPENDENT_AMBULATORY_CARE_PROVIDER_SITE_OTHER): Payer: PPO | Admitting: Internal Medicine

## 2019-06-06 DIAGNOSIS — Z20828 Contact with and (suspected) exposure to other viral communicable diseases: Secondary | ICD-10-CM | POA: Diagnosis not present

## 2019-06-06 DIAGNOSIS — R059 Cough, unspecified: Secondary | ICD-10-CM

## 2019-06-06 DIAGNOSIS — J22 Unspecified acute lower respiratory infection: Secondary | ICD-10-CM | POA: Diagnosis not present

## 2019-06-06 DIAGNOSIS — R05 Cough: Secondary | ICD-10-CM | POA: Diagnosis not present

## 2019-06-06 DIAGNOSIS — D869 Sarcoidosis, unspecified: Secondary | ICD-10-CM

## 2019-06-06 DIAGNOSIS — Z20822 Contact with and (suspected) exposure to covid-19: Secondary | ICD-10-CM

## 2019-06-06 MED ORDER — CLARITHROMYCIN 500 MG PO TABS: 500.0000 mg | ORAL_TABLET | Freq: Two times a day (BID) | ORAL | 0 refills | Status: DC

## 2019-06-06 NOTE — Progress Notes (Signed)
   Subjective:    Patient ID: Julia Solis, female    DOB: November 06, 1950, 68 y.o.   MRN: [id number]  HPI Patient was last seen here December 2019 with fluctuations in blood pressure.  She subsequently underwent a work-up at [facility] involving a PET scan.    Tells me she was diagnosed with Sarcoidosis and is just coming off a 60-day course of steroids.  She called today complaining of cough and postnasal drip and Doxy virtual visit was arranged  Only exposure to others is granddaughter who is in daycare.  Has been trying Claritin and Benadryl.  Has tickle in throat postnasal drip and drainage appears to be slightly yellow.  No fever, shaking chills, headache, nausea, loss of sense of taste or smell  Patient is seen by interactive audio and video telecommunications due to the coronavirus pandemic.  She is identified using 2 identifiers as Julia Solis, a patient in this practice.  Review of Systems see above     Objective:   Physical Exam  Patient tells me her temperature is 98.9 and her pulse oximetry is 97%.  She appears to be in no acute distress.      Assessment & Plan:  Acute lower respiratory infection  Recent diagnosis of sarcoidosis  Rule out COVID-19 infection  Plan: Patient will be sent to testing center for COVID-19 testing.  Call in Biaxin 500 mg twice daily for 10 days.

## 2019-06-06 NOTE — Patient Instructions (Addendum)
Biaxin 500 mg twice daily x 10 days.  COVID-19 testing arranged and proved to be negative.

## 2019-06-07 ENCOUNTER — Telehealth: Payer: Self-pay | Admitting: Internal Medicine

## 2019-06-07 ENCOUNTER — Encounter: Payer: Self-pay | Admitting: Internal Medicine

## 2019-06-07 LAB — NOVEL CORONAVIRUS, NAA: SARS-CoV-2, NAA: NOT DETECTED

## 2019-06-07 NOTE — Telephone Encounter (Signed)
Covid-19 test is negative. Called patient with results.  I  was not able to see notes from Pulmonologist from [facility] on Care Everywhere.   Patient tells me that when she was being seen by GYN oncologist late last year, a PET scan was ordered for some possible adenopathy seen on CT. PET scan had numerous abnormal areas. Malignancy was considered but ruled out. Pt has been dx with sarcoidosis- diffuse involvement of lung, possible heart with abnl ECHO and kidney as well as bone according to pt.   Pt will request records be sent here requested from [facility].   Has been on Prednisone twice now- once in the Spring for about 4 weeks and just finished a course for about 60 days.

## 2019-06-19 DIAGNOSIS — D2261 Melanocytic nevi of right upper limb, including shoulder: Secondary | ICD-10-CM | POA: Diagnosis not present

## 2019-06-19 DIAGNOSIS — D2272 Melanocytic nevi of left lower limb, including hip: Secondary | ICD-10-CM | POA: Diagnosis not present

## 2019-06-19 DIAGNOSIS — L300 Nummular dermatitis: Secondary | ICD-10-CM | POA: Diagnosis not present

## 2019-06-19 DIAGNOSIS — L821 Other seborrheic keratosis: Secondary | ICD-10-CM | POA: Diagnosis not present

## 2019-06-19 DIAGNOSIS — D1801 Hemangioma of skin and subcutaneous tissue: Secondary | ICD-10-CM | POA: Diagnosis not present

## 2019-06-19 DIAGNOSIS — Z85828 Personal history of other malignant neoplasm of skin: Secondary | ICD-10-CM | POA: Diagnosis not present

## 2019-07-01 ENCOUNTER — Encounter: Payer: Self-pay | Admitting: Internal Medicine

## 2019-07-07 ENCOUNTER — Other Ambulatory Visit: Payer: Self-pay

## 2019-07-07 ENCOUNTER — Other Ambulatory Visit: Payer: PPO | Admitting: Internal Medicine

## 2019-07-07 DIAGNOSIS — Z0000 Encounter for general adult medical examination without abnormal findings: Secondary | ICD-10-CM | POA: Diagnosis not present

## 2019-07-07 DIAGNOSIS — M8580 Other specified disorders of bone density and structure, unspecified site: Secondary | ICD-10-CM

## 2019-07-07 DIAGNOSIS — Z8543 Personal history of malignant neoplasm of ovary: Secondary | ICD-10-CM

## 2019-07-07 DIAGNOSIS — E7800 Pure hypercholesterolemia, unspecified: Secondary | ICD-10-CM | POA: Diagnosis not present

## 2019-07-07 DIAGNOSIS — E559 Vitamin D deficiency, unspecified: Secondary | ICD-10-CM

## 2019-07-08 LAB — CBC WITH DIFFERENTIAL/PLATELET
Absolute Monocytes: 475 cells/uL (ref 200–950)
Basophils Absolute: 29 cells/uL (ref 0–200)
Basophils Relative: 0.6 %
Eosinophils Absolute: 240 cells/uL (ref 15–500)
Eosinophils Relative: 5 %
HCT: 38.5 % (ref 35.0–45.0)
Hemoglobin: 13 g/dL (ref 11.7–15.5)
Lymphs Abs: 1714 cells/uL (ref 850–3900)
MCH: 29.8 pg (ref 27.0–33.0)
MCHC: 33.8 g/dL (ref 32.0–36.0)
MCV: 88.3 fL (ref 80.0–100.0)
MPV: 10.9 fL (ref 7.5–12.5)
Monocytes Relative: 9.9 %
Neutro Abs: 2342 cells/uL (ref 1500–7800)
Neutrophils Relative %: 48.8 %
Platelets: 204 10*3/uL (ref 140–400)
RBC: 4.36 10*6/uL (ref 3.80–5.10)
RDW: 13.1 % (ref 11.0–15.0)
Total Lymphocyte: 35.7 %
WBC: 4.8 10*3/uL (ref 3.8–10.8)

## 2019-07-08 LAB — LIPID PANEL
Cholesterol: 249 mg/dL — ABNORMAL HIGH (ref <200)
HDL: 71 mg/dL (ref >=50)
LDL Cholesterol (Calc): 161 mg/dL (calc) — ABNORMAL HIGH
Non-HDL Cholesterol (Calc): 178 mg/dL (calc) — ABNORMAL HIGH (ref <130)
Total CHOL/HDL Ratio: 3.5 (calc) (ref <5.0)
Triglycerides: 71 mg/dL (ref <150)

## 2019-07-08 LAB — COMPLETE METABOLIC PANEL WITH GFR
AG Ratio: 1.7 (calc) (ref 1.0–2.5)
ALT: 9 U/L (ref 6–29)
AST: 17 U/L (ref 10–35)
Albumin: 3.9 g/dL (ref 3.6–5.1)
Alkaline phosphatase (APISO): 84 U/L (ref 37–153)
BUN: 14 mg/dL (ref 7–25)
CO2: 26 mmol/L (ref 20–32)
Calcium: 9.2 mg/dL (ref 8.6–10.4)
Chloride: 105 mmol/L (ref 98–110)
Creat: 0.71 mg/dL (ref 0.50–0.99)
GFR, Est African American: 102 mL/min/{1.73_m2} (ref >=60)
GFR, Est Non African American: 88 mL/min/{1.73_m2} (ref >=60)
Globulin: 2.3 g/dL (calc) (ref 1.9–3.7)
Glucose, Bld: 94 mg/dL (ref 65–99)
Potassium: 4 mmol/L (ref 3.5–5.3)
Sodium: 139 mmol/L (ref 135–146)
Total Bilirubin: 0.5 mg/dL (ref 0.2–1.2)
Total Protein: 6.2 g/dL (ref 6.1–8.1)

## 2019-07-08 LAB — TSH: TSH: 2.24 mIU/L (ref 0.40–4.50)

## 2019-07-10 ENCOUNTER — Telehealth: Payer: Self-pay | Admitting: Internal Medicine

## 2019-07-10 NOTE — Telephone Encounter (Signed)
Julia Solis [phone]  [name] called to see if she could get a hard copy of her recent labs to take with her to her appointment with [facility] on Wednesday. She has CPE scheduled for 07/14/19

## 2019-07-10 NOTE — Telephone Encounter (Signed)
These are available on My Chart and caregivers at [facility] are able to see in Epic or you can print them off for her.

## 2019-07-11 ENCOUNTER — Other Ambulatory Visit: Payer: PPO | Admitting: Internal Medicine

## 2019-07-11 NOTE — Telephone Encounter (Signed)
Called Julia Solis and she is going to come by and pick Lab Results up.

## 2019-07-12 DIAGNOSIS — Z87891 Personal history of nicotine dependence: Secondary | ICD-10-CM | POA: Diagnosis not present

## 2019-07-12 DIAGNOSIS — D869 Sarcoidosis, unspecified: Secondary | ICD-10-CM | POA: Diagnosis not present

## 2019-07-12 DIAGNOSIS — D7389 Other diseases of spleen: Secondary | ICD-10-CM | POA: Diagnosis not present

## 2019-07-12 DIAGNOSIS — E042 Nontoxic multinodular goiter: Secondary | ICD-10-CM | POA: Diagnosis not present

## 2019-07-12 DIAGNOSIS — R590 Localized enlarged lymph nodes: Secondary | ICD-10-CM | POA: Diagnosis not present

## 2019-07-12 DIAGNOSIS — J984 Other disorders of lung: Secondary | ICD-10-CM | POA: Diagnosis not present

## 2019-07-12 DIAGNOSIS — I272 Pulmonary hypertension, unspecified: Secondary | ICD-10-CM | POA: Diagnosis not present

## 2019-07-12 DIAGNOSIS — R160 Hepatomegaly, not elsewhere classified: Secondary | ICD-10-CM | POA: Diagnosis not present

## 2019-07-14 ENCOUNTER — Other Ambulatory Visit: Payer: Self-pay

## 2019-07-14 ENCOUNTER — Encounter: Payer: Self-pay | Admitting: Internal Medicine

## 2019-07-14 ENCOUNTER — Ambulatory Visit (INDEPENDENT_AMBULATORY_CARE_PROVIDER_SITE_OTHER): Payer: PPO | Admitting: Internal Medicine

## 2019-07-14 VITALS — BP 150/100 | HR 78 | Ht 64.0 in | Wt 186.0 lb

## 2019-07-14 DIAGNOSIS — R200 Anesthesia of skin: Secondary | ICD-10-CM

## 2019-07-14 DIAGNOSIS — H905 Unspecified sensorineural hearing loss: Secondary | ICD-10-CM

## 2019-07-14 DIAGNOSIS — R208 Other disturbances of skin sensation: Secondary | ICD-10-CM

## 2019-07-14 DIAGNOSIS — Z8543 Personal history of malignant neoplasm of ovary: Secondary | ICD-10-CM

## 2019-07-14 DIAGNOSIS — E7800 Pure hypercholesterolemia, unspecified: Secondary | ICD-10-CM | POA: Diagnosis not present

## 2019-07-14 DIAGNOSIS — D869 Sarcoidosis, unspecified: Secondary | ICD-10-CM | POA: Diagnosis not present

## 2019-07-14 DIAGNOSIS — Z0000 Encounter for general adult medical examination without abnormal findings: Secondary | ICD-10-CM

## 2019-07-14 DIAGNOSIS — G4709 Other insomnia: Secondary | ICD-10-CM

## 2019-07-14 LAB — POCT URINALYSIS DIPSTICK
Appearance: NEGATIVE
Bilirubin, UA: NEGATIVE
Glucose, UA: NEGATIVE
Ketones, UA: NEGATIVE
Leukocytes, UA: NEGATIVE
Nitrite, UA: NEGATIVE
Odor: NEGATIVE
Protein, UA: NEGATIVE
Spec Grav, UA: 1.01 (ref 1.010–1.025)
Urobilinogen, UA: 0.2 E.U./dL
pH, UA: 6.5 (ref 5.0–8.0)

## 2019-07-14 MED ORDER — LORAZEPAM 1 MG PO TABS: ORAL_TABLET | ORAL | 0 refills | Status: DC

## 2019-07-14 NOTE — Progress Notes (Signed)
Subjective:    Patient ID: Julia Solis, female    DOB: 15-Jun-1951, 68 y.o.   MRN: [id number]  HPI  68 year old Female for health maintenance exam and evaluation of medical issues.  Followed at [facility] for history of ovarian cancer in 2011 with no evidence of recurrence.  Had renal stone in 2019 and CT suggested lymphadenopathy.  Had an abnormal PET scan.  She was subsequently diagnosed with sarcoidosis.  She had had lithotripsy for treatment of the stone in 2019.  Seeing Dr. Girard Cooter at [facility]  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  She had bone biopsy showing granulomatous inflammation.  She has known since the 1990s that she had abnormal chest x-ray.  Uptake noted on PET scan and lymph node spleen and bones.  Has mild pulmonary hypertension.  Does use oxygen overnight.  Pulse oximetry has ranged between 92 to 94% nighttime with oxygen.  History of lumbar disc disease status post surgery by Dr. [name] in 1997.  Chronic numbness in left foot from that surgery.  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 [name].  She had total abdominal hysterectomy, BSO and bilateral pelvic node dissection.  Had aortic node sampling, omentectomy, appendectomy and biopsies.  Lymph nodes were negative for malignancy.  Symptoms started February 2011 with abdominal gas and bloating.  CT of the chest at that time showed tiny nodules thought to be benign but in retrospect may have been sarcoidosis  Additional past medical history: Ectopic pregnancy 1980, benign left breast biopsy 1993.  C-sections 1992 in 1995.  Ganglion cyst removed from wrist 1975.  Tonsillectomy 1957.  Browlift 2008.  Social history: She is divorced and has 2 adult daughters.  Does not smoke but did smoke from the ages of 61 through 28.  Social alcohol consumption.  Has had some  issues with insomnia and has tried Ambien and lorazepam.  Colonoscopy done September 2012 by Dr. Earle Gell was apparently normal.  History of mild vitamin D deficiency.  Family history: Father died at age 21 with prostate cancer.  Brother died at age 52 of an AVM rupture.  1 sister. .       Review of Systems has had some cough that improved with a course of prednisone     Objective:   Physical Exam blood pressure 150/100, pulse 78, weight 186 pounds BMI 31.93.  pulse ox 98% Skin warm and dry.  No palpable adenopathy.  Chest clear.  Cardiac exam regular rate and rhythm.  Abdomen no hepatosplenomegaly masses or tenderness.  No lower extremity edema.  Neuro no gross focal deficits on brief neurological exam.  Breast without masses.  Affect thought and judgment are normal.       Assessment & Plan:  Sarcoidosis- Pulmonologist wants her to have Echo annually so will need to schedule for January.  Oxygen level 92%  at night- does not need supplemental oxygen per Pulmonologist  Spleen lesion being watched by Pulmonologist  Osteopenia- continue to watch. Continue Vitamin D supplement.  Lowest T score -1.7 on bone density study January 2020  Musculoskeletal pain treated Meloxicam prn has some left  Allergic rhinitis- treated with Claritin  Insomnia- refill Lorazepam  GYN- Dr. Deatra Ina retired but she still sees GYN oncologist  GERD- take Pepcid instead of TUMS  Hyperlipidemia- may need statin doesn't want.  Total cholesterol  was 249 with an LDL cholesterol of 161.  She may benefit from low-dose statin therapy even once weekly.  Vitamin D- taking Vitamin D 2000 units daily  Anxiety state with diagnosis of sarcoidosis  Plan: Need to arrange echocardiogram for January 2021.  Watch diet and exercise.  Recommend repeat lipid panel in 6 months and give strong consideration to low-dose statin therapy.  Subjective:   Patient presents for Medicare Annual/Subsequent preventive  examination.  Review Past Medical/Family/Social: See above   Risk Factors  Current exercise habits: Some light exercise Dietary issues discussed: Low-fat low carbohydrate  Cardiac risk factors: Hyperlipidemia  Depression Screen  (Note: if answer to either of the following is "Yes", a more complete depression screening is indicated)   Over the past two weeks, have you felt down, depressed or hopeless? No  Over the past two weeks, have you felt little interest or pleasure in doing things? No Have you lost interest or pleasure in daily life? No Do you often feel hopeless? No Do you cry easily over simple problems? No   Activities of Daily Living  In your present state of health, do you have any difficulty performing the following activities?:   Driving? No  Managing money? No  Feeding yourself? No  Getting from bed to chair? No  Climbing a flight of stairs? No  Preparing food and eating?: No  Bathing or showering? No  Getting dressed: No  Getting to the toilet? No  Using the toilet:No  Moving around from place to place: No  In the past year have you fallen or had a near fall?:  Yes in October 2019 Are you sexually active? No  Do you have more than one partner? No   Hearing Difficulties: No  Do you often ask people to speak up or repeat themselves?  Yes Do you experience ringing or noises in your ears? No  Do you have difficulty understanding soft or whispered voices?  Sometimes Do you feel that you have a problem with memory? No Do you often misplace items? No    Home Safety:  Do you have a smoke alarm at your residence? Yes Do you have grab bars in the bathroom?  Yes Do you have throw rugs in your house?  Yes   Cognitive Testing  Alert? Yes Normal Appearance?Yes  Oriented to person? Yes Place? Yes  Time? Yes  Recall of three objects? Yes  Can perform simple calculations? Yes  Displays appropriate judgment?Yes  Can read the correct time from a watch face?Yes    List the Names of Other Physician/Practitioners you currently use:  See referral list for the physicians patient is currently seeing.  GYN oncologist and pulmonologist at [facility]  Review of Systems: See above  Objective:     General appearance: Appears stated age Head: Normocephalic, without obvious abnormality, atraumatic  Eyes: conj clear, EOMi PEERLA  Ears: normal TM's and external ear canals both ears  Nose: Nares normal. Septum midline. Mucosa normal. No drainage or sinus tenderness.  Throat: lips, mucosa, and tongue normal; teeth and gums normal  Neck: no adenopathy, no carotid bruit, no JVD, supple, symmetrical, trachea midline and thyroid not enlarged, symmetric, no tenderness/mass/nodules  No CVA tenderness.  Lungs: clear to auscultation bilaterally  Breasts: normal appearance, no masses or tenderness Heart: regular rate and rhythm, S1, S2 normal, no murmur, click, rub or gallop  Abdomen: soft, non-tender; bowel sounds normal; no masses, no organomegaly  Musculoskeletal: ROM normal in all  joints, no crepitus, no deformity, Normal muscle strengthen. Back  is symmetric, no curvature. Skin: Skin color, texture, turgor normal. No rashes or lesions  Lymph nodes: Cervical, supraclavicular, and axillary nodes normal.  Neurologic: CN 2 -12 Normal, Normal symmetric reflexes. Normal coordination and gait  Psych: Alert & Oriented x 3, Mood appear stable.    Assessment:    Annual wellness medicare exam   Plan:    During the course of the visit the patient was educated and counseled about appropriate screening and preventive services including:   Annual flu vaccine     Patient Instructions (the written plan) was given to the patient.  Medicare Attestation  I have personally reviewed:  The patient's medical and social history  Their use of alcohol, tobacco or illicit drugs  Their current medications and supplements  The patient's functional  ability including ADLs,fall risks, home safety risks, cognitive, and hearing and visual impairment  Diet and physical activities  Evidence for depression or mood disorders  The patient's weight, height, BMI, and visual acuity have been recorded in the chart. I have made referrals, counseling, and provided education to the patient based on review of the above and I have provided the patient with a written personalized care plan for preventive services.

## 2019-08-05 NOTE — Patient Instructions (Addendum)
Watch diet and try to get more exercise.  Recommend repeat lipid panel in 6 months.  May very well need lipid lowering medication.  Continue to see specialist at [facility].  Patient indicates she needs to have echocardiogram in January.

## 2019-08-28 ENCOUNTER — Other Ambulatory Visit: Payer: Self-pay

## 2019-09-01 IMAGING — CR DG ABDOMEN 1V
1 series · 1 of 1 positions shown · non-contrast
Comparison: KUB June 13, 2018

CLINICAL DATA: Preoperative examination prior to left-sided
lithotripsy.

EXAM:
ABDOMEN - 1 VIEW

[t abdomen supine]
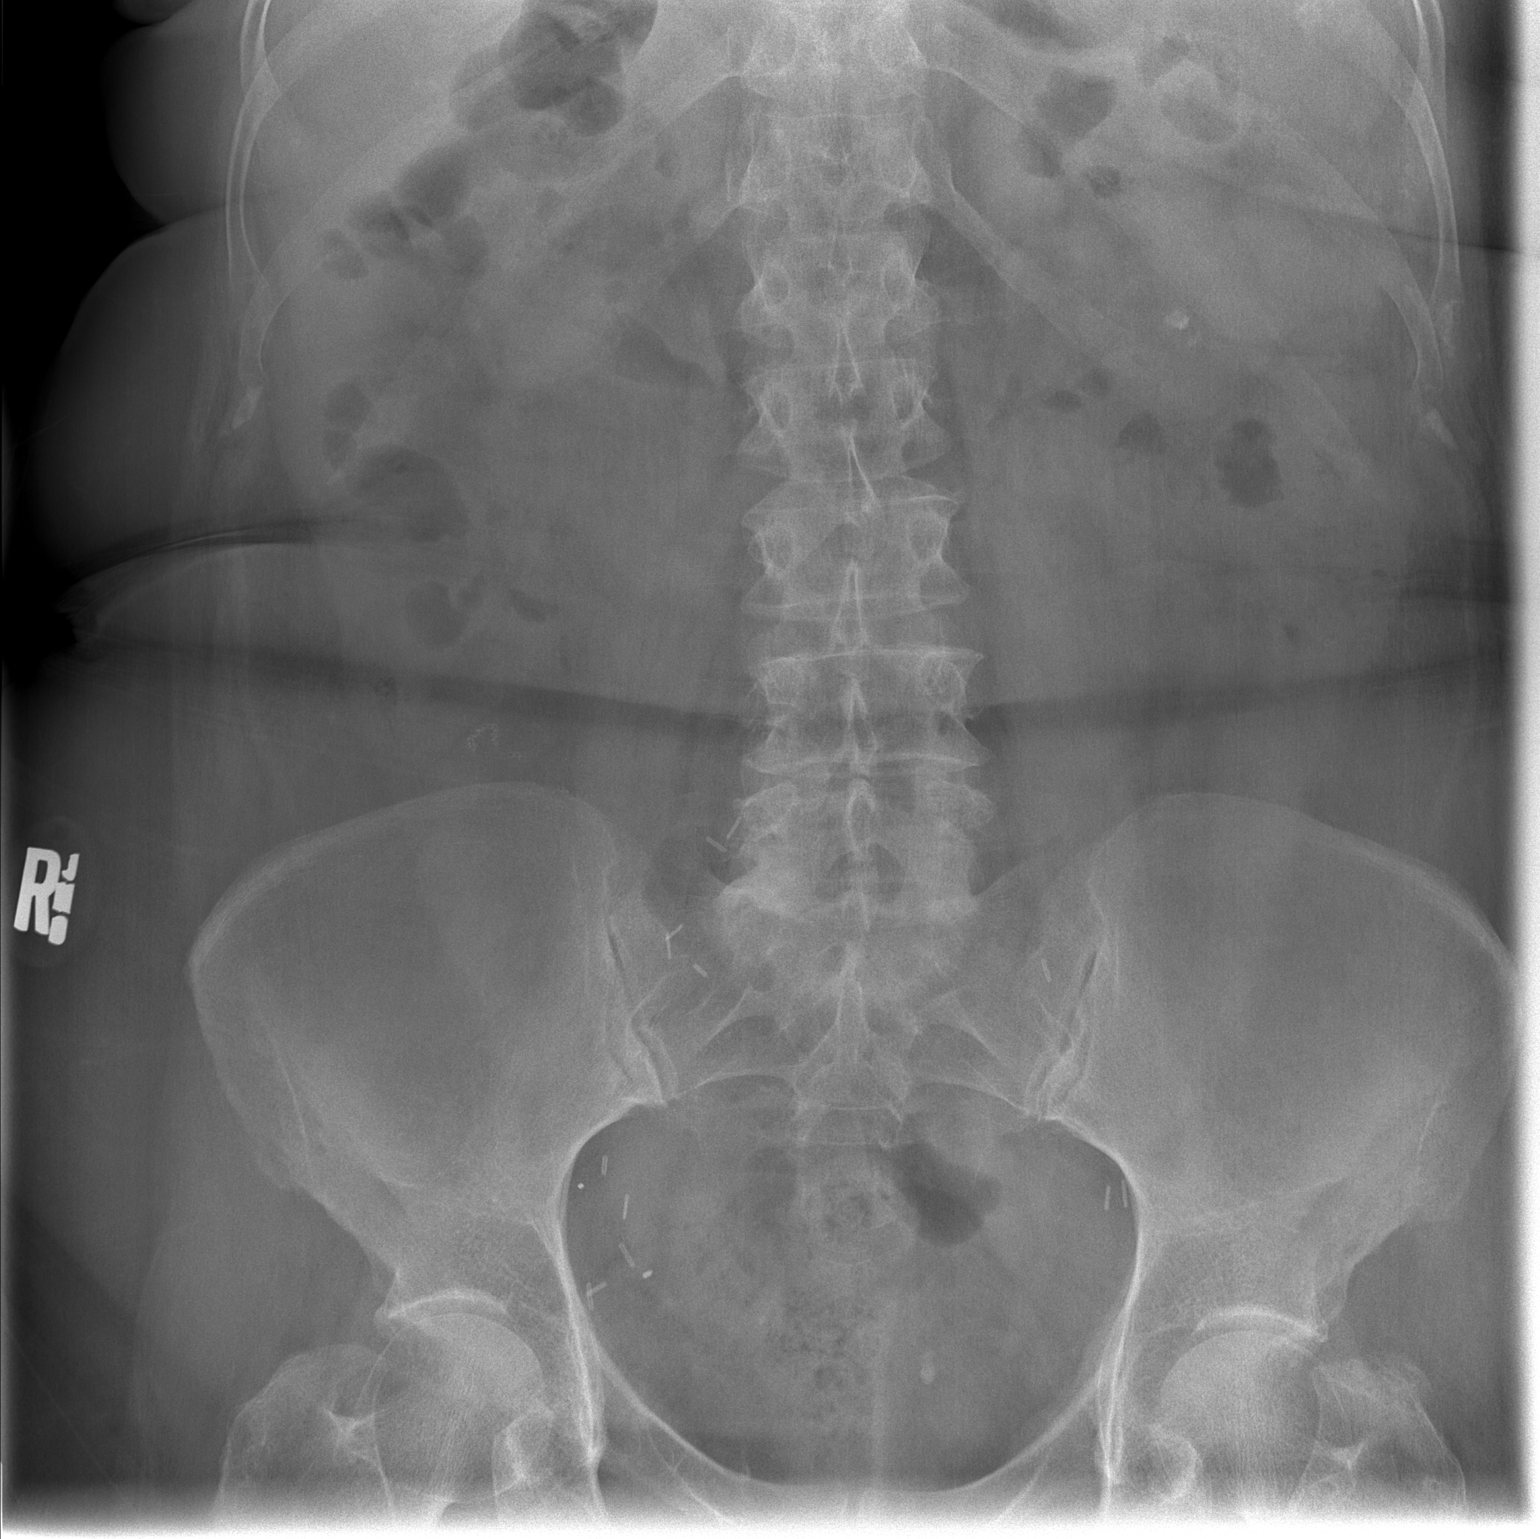

[1 of 1 positions shown; findings below may reference images not displayed]

FINDINGS: Calcifications project over the mid to lower pole of the left kidney
and appear stable. A previously demonstrated proximal left ureteral
stone appears to have migrated distally. It comes lie just superior
to a previously demonstrated left-sided pelvic phleboliths. On the
right no stones are evident. The bowel gas pattern is unremarkable.
There degenerative changes of the lower lumbar spine.
IMPRESSION: There has been distal migration of a previously demonstrated
proximal left ureteral stone. The stone now comes to lie in the
region of the left UVJ. There are at least 2 other stones which
project over the mid to lower pole of the left kidney.

## 2019-09-12 ENCOUNTER — Telehealth: Payer: Self-pay

## 2019-09-12 DIAGNOSIS — I491 Atrial premature depolarization: Secondary | ICD-10-CM

## 2019-09-12 NOTE — Telephone Encounter (Signed)
Yes we discussed at her visit that doctor in [place] wants this

## 2019-09-12 NOTE — Telephone Encounter (Signed)
Patient would like to have echocardiogram in January okay to order?

## 2019-09-13 NOTE — Telephone Encounter (Signed)
Ordered echocardiogram, patient will call to schedule.

## 2019-10-03 ENCOUNTER — Ambulatory Visit: Payer: PPO | Attending: Internal Medicine

## 2019-10-03 DIAGNOSIS — Z20822 Contact with and (suspected) exposure to covid-19: Secondary | ICD-10-CM

## 2019-10-03 DIAGNOSIS — Z20828 Contact with and (suspected) exposure to other viral communicable diseases: Secondary | ICD-10-CM | POA: Diagnosis not present

## 2019-10-04 LAB — NOVEL CORONAVIRUS, NAA: SARS-CoV-2, NAA: NOT DETECTED

## 2019-10-11 DIAGNOSIS — R918 Other nonspecific abnormal finding of lung field: Secondary | ICD-10-CM | POA: Diagnosis not present

## 2019-10-11 DIAGNOSIS — N200 Calculus of kidney: Secondary | ICD-10-CM | POA: Diagnosis not present

## 2019-10-11 DIAGNOSIS — N2889 Other specified disorders of kidney and ureter: Secondary | ICD-10-CM | POA: Diagnosis not present

## 2019-10-11 DIAGNOSIS — J479 Bronchiectasis, uncomplicated: Secondary | ICD-10-CM | POA: Diagnosis not present

## 2019-10-11 DIAGNOSIS — K769 Liver disease, unspecified: Secondary | ICD-10-CM | POA: Diagnosis not present

## 2019-10-11 DIAGNOSIS — D739 Disease of spleen, unspecified: Secondary | ICD-10-CM | POA: Diagnosis not present

## 2019-10-11 DIAGNOSIS — I313 Pericardial effusion (noninflammatory): Secondary | ICD-10-CM | POA: Diagnosis not present

## 2019-10-11 DIAGNOSIS — J189 Pneumonia, unspecified organism: Secondary | ICD-10-CM | POA: Diagnosis not present

## 2019-10-16 DIAGNOSIS — D869 Sarcoidosis, unspecified: Secondary | ICD-10-CM | POA: Diagnosis not present

## 2019-10-19 DIAGNOSIS — Z9221 Personal history of antineoplastic chemotherapy: Secondary | ICD-10-CM | POA: Diagnosis not present

## 2019-10-19 DIAGNOSIS — Z08 Encounter for follow-up examination after completed treatment for malignant neoplasm: Secondary | ICD-10-CM | POA: Diagnosis not present

## 2019-10-19 DIAGNOSIS — Z8543 Personal history of malignant neoplasm of ovary: Secondary | ICD-10-CM | POA: Diagnosis not present

## 2019-10-19 DIAGNOSIS — Z9889 Other specified postprocedural states: Secondary | ICD-10-CM | POA: Diagnosis not present

## 2019-10-30 DIAGNOSIS — Z1231 Encounter for screening mammogram for malignant neoplasm of breast: Secondary | ICD-10-CM | POA: Diagnosis not present

## 2019-10-30 LAB — HM MAMMOGRAPHY

## 2019-10-31 ENCOUNTER — Encounter: Payer: Self-pay | Admitting: Internal Medicine

## 2019-11-01 ENCOUNTER — Other Ambulatory Visit: Payer: Self-pay

## 2019-11-01 ENCOUNTER — Ambulatory Visit (HOSPITAL_COMMUNITY): Payer: PPO | Attending: Cardiovascular Disease

## 2019-11-01 DIAGNOSIS — I10 Essential (primary) hypertension: Secondary | ICD-10-CM | POA: Diagnosis not present

## 2019-11-01 DIAGNOSIS — I272 Pulmonary hypertension, unspecified: Secondary | ICD-10-CM | POA: Diagnosis not present

## 2019-11-01 DIAGNOSIS — I491 Atrial premature depolarization: Secondary | ICD-10-CM | POA: Insufficient documentation

## 2019-11-04 ENCOUNTER — Ambulatory Visit: Payer: PPO

## 2019-11-09 ENCOUNTER — Ambulatory Visit: Payer: PPO

## 2019-11-13 DIAGNOSIS — H01004 Unspecified blepharitis left upper eyelid: Secondary | ICD-10-CM | POA: Diagnosis not present

## 2019-11-15 ENCOUNTER — Ambulatory Visit: Payer: PPO

## 2019-11-20 ENCOUNTER — Other Ambulatory Visit: Payer: Self-pay | Admitting: Internal Medicine

## 2019-11-21 ENCOUNTER — Encounter: Payer: Self-pay | Admitting: Internal Medicine

## 2019-11-21 ENCOUNTER — Ambulatory Visit (INDEPENDENT_AMBULATORY_CARE_PROVIDER_SITE_OTHER): Payer: PPO | Admitting: Internal Medicine

## 2019-11-21 ENCOUNTER — Telehealth: Payer: Self-pay | Admitting: Internal Medicine

## 2019-11-21 VITALS — Ht 64.0 in

## 2019-11-21 DIAGNOSIS — H8110 Benign paroxysmal vertigo, unspecified ear: Secondary | ICD-10-CM

## 2019-11-21 MED ORDER — MECLIZINE HCL 25 MG PO TABS: 25.0000 mg | ORAL_TABLET | Freq: Three times a day (TID) | ORAL | 0 refills | Status: DC | PRN

## 2019-11-21 NOTE — Telephone Encounter (Signed)
Julia Solis [phone]  [name] called to say she has had vertigo since Saturday, its not to bad just not going away, has never had it for this long, she has never taking anything for it. She did take a dramamine yesterday, it did not help. Always has congestion. BP 128/86, no fever

## 2019-11-21 NOTE — Progress Notes (Signed)
   Subjective:    Patient ID: Julia Solis, female    DOB: 1951/06/17, 69 y.o.   MRN: [id number]  HPI 69 year old Female seen today by interactive audio and video telecommunications due to coronavirus pandemic.  She is agreeable to visit in this format today.  She is identified using 2 identifiers as Julia Solis, a patient in this practice.  Apparently her granddaughter with whom she has been sometime was possibly exposed to COVID-19.  Patient is awaiting results of granddaughter's test and that is the reason we decided to do virtual visit today.  Patient has had recent issues with dizziness.  Says she has had a history of dizziness in the past treated with meclizine.  Has had some postnasal drip.  Notices issues with position change.  Has felt queasy but not vomited.  Episodes have lasted between 15 and 30 minutes.  No fever chills or dysgeusia.  No sore throat.  No headache.    Review of Systems see above     Objective:   Physical Exam She reports that she is afebrile.  She is seen virtually in no acute distress and is able to drive.  Neurological testing not able to be done as she was sitting in her car.       Assessment & Plan:  Vertigo-benign positional  Plan: She responded well to meclizine in the past and we will try 25 mg 3 times a day.  She also takes Ativan at night as needed for sleep which will also help vertigo.  Explained symptoms could last 7 to 10 days.  I do not see a reason to try an antibiotic at this point in time.  She is awaiting Covid test results on her granddaughter and will let me know if she needs to be tested as well.

## 2019-11-21 NOTE — Telephone Encounter (Signed)
Book OV

## 2019-11-25 NOTE — Patient Instructions (Signed)
Take meclizine 25 mg 3 times a day as needed for vertigo.  Continue with Ativan for sleep which will also help vertigo symptoms.  Please let me know if you need to be tested for COVID-19 once you know you granddaughter's results.  Call if not better in 7 to 10 days or sooner if worse.

## 2019-11-27 DIAGNOSIS — M79644 Pain in right finger(s): Secondary | ICD-10-CM | POA: Diagnosis not present

## 2019-11-28 ENCOUNTER — Other Ambulatory Visit: Payer: Self-pay

## 2019-11-28 MED ORDER — MELOXICAM 15 MG PO TABS: 15.0000 mg | ORAL_TABLET | Freq: Every day | ORAL | 0 refills | Status: DC

## 2019-12-12 DIAGNOSIS — M65311 Trigger thumb, right thumb: Secondary | ICD-10-CM | POA: Diagnosis not present

## 2019-12-21 DIAGNOSIS — H52203 Unspecified astigmatism, bilateral: Secondary | ICD-10-CM | POA: Diagnosis not present

## 2019-12-21 DIAGNOSIS — H2513 Age-related nuclear cataract, bilateral: Secondary | ICD-10-CM | POA: Diagnosis not present

## 2020-01-11 DIAGNOSIS — M65311 Trigger thumb, right thumb: Secondary | ICD-10-CM | POA: Diagnosis not present

## 2020-01-16 ENCOUNTER — Other Ambulatory Visit: Payer: Self-pay

## 2020-01-16 ENCOUNTER — Other Ambulatory Visit: Payer: PPO | Admitting: Internal Medicine

## 2020-01-16 DIAGNOSIS — E7800 Pure hypercholesterolemia, unspecified: Secondary | ICD-10-CM

## 2020-01-16 LAB — HEPATIC FUNCTION PANEL
AG Ratio: 1.6 (calc) (ref 1.0–2.5)
ALT: 8 U/L (ref 6–29)
AST: 14 U/L (ref 10–35)
Albumin: 3.8 g/dL (ref 3.6–5.1)
Alkaline phosphatase (APISO): 97 U/L (ref 37–153)
Bilirubin, Direct: 0.1 mg/dL (ref 0.0–0.2)
Globulin: 2.4 g/dL (calc) (ref 1.9–3.7)
Indirect Bilirubin: 0.4 mg/dL (calc) (ref 0.2–1.2)
Total Bilirubin: 0.5 mg/dL (ref 0.2–1.2)
Total Protein: 6.2 g/dL (ref 6.1–8.1)

## 2020-01-16 LAB — LIPID PANEL
Cholesterol: 263 mg/dL — ABNORMAL HIGH (ref <200)
HDL: 76 mg/dL (ref >=50)
LDL Cholesterol (Calc): 169 mg/dL (calc) — ABNORMAL HIGH
Non-HDL Cholesterol (Calc): 187 mg/dL (calc) — ABNORMAL HIGH (ref <130)
Total CHOL/HDL Ratio: 3.5 (calc) (ref <5.0)
Triglycerides: 74 mg/dL (ref <150)

## 2020-01-18 ENCOUNTER — Ambulatory Visit (INDEPENDENT_AMBULATORY_CARE_PROVIDER_SITE_OTHER): Payer: PPO | Admitting: Internal Medicine

## 2020-01-18 ENCOUNTER — Encounter: Payer: Self-pay | Admitting: Internal Medicine

## 2020-01-18 VITALS — BP 130/80 | HR 82 | Temp 98.0°F | Ht 64.0 in | Wt 182.0 lb

## 2020-01-18 DIAGNOSIS — D869 Sarcoidosis, unspecified: Secondary | ICD-10-CM | POA: Diagnosis not present

## 2020-01-18 DIAGNOSIS — Z8543 Personal history of malignant neoplasm of ovary: Secondary | ICD-10-CM

## 2020-01-18 DIAGNOSIS — E7800 Pure hypercholesterolemia, unspecified: Secondary | ICD-10-CM

## 2020-01-18 MED ORDER — ROSUVASTATIN CALCIUM 5 MG PO TABS: ORAL_TABLET | ORAL | 1 refills | Status: DC

## 2020-01-18 NOTE — Progress Notes (Signed)
   Subjective:    Patient ID: Julia Solis, female    DOB: 09-Jun-1951, 69 y.o.   MRN: [id number]  HPI 69 year old Female previously followed at [facility] for history of ovarian cancer in 2011.  No evidence of recurrence.. Had normal Echocardiogram which was normal. Now on one year recall for cardiac evaluation due to sarcoidosis. Saw GYN recently who released her from follow up. Had Covid-19 vaccine without problems. Had right trigger thumb seen by Orthopedist and has had 2 injections. It is improving.  She is here for 68-month follow-up.  Followed by Dr. Lowry Ram at [facility] for pulmonary sarcoidosis.  Bone biopsy showed granulomatous inflammation.  History of hyperlipidemia.  We are going to place her on Crestor 5 mg once weekly.  She takes meloxicam for musculoskeletal pain and lorazepam at bedtime as needed for sleep.  Was diagnosed with sarcoidosis in August 2019 after abdominal scan for renal stone showed enlarging splenic lesions compared to 2012.  She had a PET scan showing diffuse uptake in lymph nodes spleen lung and bones.  Occasional back pain treated intermittently with prednisone Dosepak.  History of mild pulmonary hypertension.  Her overnight oximetry did show some indication for overnight O2 supplement.  Since nighttime O2 sats ranging from 92 to 94%.  Pulmonologist indicates patient has diffuse disease with sarcoidosis in the chest including lungs pleura pericardium thoracic nodal adenopathy.  Multiple hypermetabolic splenic lesions.  Multiple gastrohepatic, para-aortic, bilateral iliac chain lymph nodes as well as abdominopelvic adenopathy.  She will return to [facility] to see pulmonologist in January 2021 and the CT will be ordered at that time as well as a TTE.  She is to have annual eye exam per pulmonology recommendation.  Overnight oximetry showed PO2 dipping below 89%.  Pulmonologist recommends patient take flu  and Pneumovax coccal immunizations.  She has a longstanding history of total hypercholesterolemia.  6 months ago total cholesterol was 249 and is now 263.  LDL has increased from 1 61-1 69.  40 years ago total cholesterol was 217.  She is going to start Crestor 5 mg weekly and follow-up here in June for labs.    Review of Systems see above     Objective:   Physical Exam Blood pressure 130/80, pulse 82, temperature 98 degrees orally pulse oximetry 98% weight 182 pounds height 5 feet 4 inches BMI 31.24  Skin warm and dry.  Chest clear to auscultation.  Cardiac exam regular rate and rhythm.  Extremities without edema.       Assessment & Plan:  Pure hypercholesterolemia-start Crestor 5 mg daily and follow-up in June  Sarcoidosis-multiple organ involvement followed at [facility]: She will follow-up here in June for lipid panel and liver functions and likely telephone call for virtual visit to follow-up after lab draw.

## 2020-01-31 NOTE — Patient Instructions (Signed)
Begin Crestor 5 mg weekly and follow-up in June.

## 2020-03-14 ENCOUNTER — Other Ambulatory Visit: Payer: PPO | Admitting: Internal Medicine

## 2020-03-14 ENCOUNTER — Other Ambulatory Visit: Payer: Self-pay

## 2020-03-14 DIAGNOSIS — E7800 Pure hypercholesterolemia, unspecified: Secondary | ICD-10-CM

## 2020-03-14 LAB — HEPATIC FUNCTION PANEL
AG Ratio: 1.9 (calc) (ref 1.0–2.5)
ALT: 9 U/L (ref 6–29)
AST: 16 U/L (ref 10–35)
Albumin: 4.1 g/dL (ref 3.6–5.1)
Alkaline phosphatase (APISO): 100 U/L (ref 37–153)
Bilirubin, Direct: 0.1 mg/dL (ref 0.0–0.2)
Globulin: 2.2 g/dL (calc) (ref 1.9–3.7)
Indirect Bilirubin: 0.4 mg/dL (calc) (ref 0.2–1.2)
Total Bilirubin: 0.5 mg/dL (ref 0.2–1.2)
Total Protein: 6.3 g/dL (ref 6.1–8.1)

## 2020-03-14 LAB — LIPID PANEL
Cholesterol: 236 mg/dL — ABNORMAL HIGH (ref <200)
HDL: 79 mg/dL (ref >=50)
LDL Cholesterol (Calc): 138 mg/dL (calc) — ABNORMAL HIGH
Non-HDL Cholesterol (Calc): 157 mg/dL (calc) — ABNORMAL HIGH (ref <130)
Total CHOL/HDL Ratio: 3 (calc) (ref <5.0)
Triglycerides: 88 mg/dL (ref <150)

## 2020-03-19 ENCOUNTER — Telehealth: Payer: Self-pay | Admitting: Internal Medicine

## 2020-03-19 NOTE — Telephone Encounter (Signed)
Appointment scheduled.

## 2020-03-19 NOTE — Telephone Encounter (Signed)
Needs OV.  

## 2020-03-19 NOTE — Telephone Encounter (Signed)
Julia Solis [phone]  [name] called to see if she needs to change how she is currently taking her satin medication based on her current lab results, if so she will need new prescription sent in.

## 2020-03-21 ENCOUNTER — Ambulatory Visit (INDEPENDENT_AMBULATORY_CARE_PROVIDER_SITE_OTHER): Payer: PPO | Admitting: Internal Medicine

## 2020-03-21 ENCOUNTER — Other Ambulatory Visit: Payer: Self-pay

## 2020-03-21 VITALS — BP 130/80 | HR 76 | Ht 64.0 in | Wt 184.0 lb

## 2020-03-21 DIAGNOSIS — E7800 Pure hypercholesterolemia, unspecified: Secondary | ICD-10-CM | POA: Diagnosis not present

## 2020-03-21 DIAGNOSIS — M65311 Trigger thumb, right thumb: Secondary | ICD-10-CM | POA: Diagnosis not present

## 2020-03-21 MED ORDER — ROSUVASTATIN CALCIUM 5 MG PO TABS: ORAL_TABLET | ORAL | 1 refills | Status: DC

## 2020-03-21 NOTE — Progress Notes (Signed)
   Subjective:    Patient ID: Julia Solis, female    DOB: 1951-03-02, 69 y.o.   MRN: [id number]  HPI 69 year old Female seen today to follow-up on hyperlipidemia.  She was seen in April.  Remote history of ovarian cancer in 2011 and has been released from GYN follow-up.  Has developed pulmonary sarcoidosis and is followed by Dr. Lowry Ram at [facility].  We placed her on Crestor 5 mg weekly in April for pure hypercholesterolemia.  At the time total cholesterol was 263 and LDL cholesterol 169.  9 months previously total cholesterol has been 249 and LDL 161.  A year previously total cholesterol had been 240 with an LDL cholesterol of 162.  Lipid panel repeated on June 10 shows total cholesterol of 236 with an LDL cholesterol of 138.  Some but not a lot of improvement on Crestor 5 mg weekly.  Have suggested she now increase this to 3 times a week with supper.  Liver panel is normal.  She will have repeat lipid panel and liver functions in August.  She has Medicare wellness and physical exam scheduled for October.  Review of Systems see above     Objective:   Physical Exam Blood pressure 130/80, pulse 76 pulse oximetry 96% weight 184 pounds BMI 31.58       Assessment & Plan:  Continue to encourage diet exercise and weight loss.  Increase Crestor to 5 mg 3 times a week.  Follow-up labs in August.  Keep appointment for health maintenance exam in October.  Diagnosis: Pure hypercholesterolemia

## 2020-03-21 NOTE — Patient Instructions (Addendum)
OK to have Shingrix vaccine. Increase Crestor to 5 mg 3 times a week with supper and RTC in 2 months for labs only.  Physical exam scheduled for October 2021

## 2020-04-03 ENCOUNTER — Encounter: Payer: Self-pay | Admitting: Internal Medicine

## 2020-04-30 ENCOUNTER — Telehealth (INDEPENDENT_AMBULATORY_CARE_PROVIDER_SITE_OTHER): Payer: PPO | Admitting: Internal Medicine

## 2020-04-30 ENCOUNTER — Telehealth: Payer: Self-pay | Admitting: Internal Medicine

## 2020-04-30 ENCOUNTER — Encounter: Payer: Self-pay | Admitting: Internal Medicine

## 2020-04-30 ENCOUNTER — Other Ambulatory Visit: Payer: Self-pay

## 2020-04-30 VITALS — BP 143/80 | Temp 97.8°F | Ht 64.0 in | Wt 185.0 lb

## 2020-04-30 DIAGNOSIS — Z20822 Contact with and (suspected) exposure to covid-19: Secondary | ICD-10-CM | POA: Diagnosis not present

## 2020-04-30 DIAGNOSIS — D869 Sarcoidosis, unspecified: Secondary | ICD-10-CM | POA: Diagnosis not present

## 2020-04-30 DIAGNOSIS — J22 Unspecified acute lower respiratory infection: Secondary | ICD-10-CM | POA: Diagnosis not present

## 2020-04-30 MED ORDER — CLARITHROMYCIN 500 MG PO TABS: 500.0000 mg | ORAL_TABLET | Freq: Two times a day (BID) | ORAL | 0 refills | Status: DC

## 2020-04-30 NOTE — Telephone Encounter (Signed)
Julia Solis [phone]  [name] called to say she started having tightness in her chest today, she has had nasal drip for a few days, and cough and she is starting to have wheezing with she coughs. No fever, No COVID exposure that she knows of. Has had both COVID vaccines. Her granddaughter has had Strep throat and she has been around her, but she does not have sore throat.

## 2020-04-30 NOTE — Telephone Encounter (Signed)
She needs a Covid test and a virtual visit

## 2020-04-30 NOTE — Telephone Encounter (Signed)
Scheduled

## 2020-04-30 NOTE — Progress Notes (Signed)
   Subjective:    Patient ID: Julia Solis, female    DOB: 01-11-1951, 69 y.o.   MRN: [id number]  HPI  69 year old Female with acute onset of post nasal drainage a couple of days ago. Was at beach last week with family. Granddaughter has been diagnosed with strep throat. Patient has hx of pulmonary sarcoidosis followed at [facility].  She is not on steroid therapy.  She has had 2 pneumococcal vaccines.  Had maternal vaccine in February and March of this year.  Mild malaise malaise and fatigue.  Does complain of postnasal drip which was concerning to her and a scratchy throat.  Feels some tightness in her chest.  Not having tachypnea.  Due to the coronavirus pandemic and delta variant, patient is seen today via interactive audio and video telecommunications.  She is agreeable to visit in this format today.  She is identified using 2 identifiers as Julia Solis, a patient in this practice.  She is seen virtually in her home and I am in my office.  Review of Systems no nausea, vomiting, fever ,chills dysgeusia, or myalgias.     Objective:   Physical Exam Vital signs reviewed She is seen virtually in no acute distress.  Sounds slightly nasally congested but does not appear to be tachypneic when she is speaking.  No coughing is heard.      Assessment & Plan:  Acute lower respiratory infection  History of pulmonary sarcoidosis  Plan: She tolerates Biaxin well and we have used this previously for respiratory issues.  Have prescribed Biaxin 500 mg twice daily for 10 days.  She does not feel that she needs cough medication or an inhaler at this point in time.  She will call if symptoms worsen.  She will have a COVID-19 test and advise me of results.

## 2020-05-01 ENCOUNTER — Other Ambulatory Visit: Payer: PPO

## 2020-05-01 ENCOUNTER — Encounter: Payer: Self-pay | Admitting: Internal Medicine

## 2020-05-01 NOTE — Patient Instructions (Addendum)
Rest and drink plenty of fluids.  Have COVID-19 test and advise [place] of results.  Take Biaxin 500 mg twice daily for 10 days.  Call if not improving in 48 hours or sooner if worse.

## 2020-05-02 ENCOUNTER — Telehealth: Payer: Self-pay

## 2020-05-02 NOTE — Telephone Encounter (Signed)
Julia Solis called back to say COVID test was negative and she is feeling much better.

## 2020-05-02 NOTE — Telephone Encounter (Signed)
Great news.

## 2020-05-02 NOTE — Telephone Encounter (Signed)
Left detailed message, need results of covid test.

## 2020-05-07 DIAGNOSIS — M67441 Ganglion, right hand: Secondary | ICD-10-CM | POA: Diagnosis not present

## 2020-05-07 DIAGNOSIS — M65311 Trigger thumb, right thumb: Secondary | ICD-10-CM | POA: Diagnosis not present

## 2020-05-23 ENCOUNTER — Other Ambulatory Visit: Payer: Self-pay

## 2020-05-23 ENCOUNTER — Telehealth: Payer: Self-pay | Admitting: Internal Medicine

## 2020-05-23 ENCOUNTER — Other Ambulatory Visit: Payer: PPO | Admitting: Internal Medicine

## 2020-05-23 DIAGNOSIS — E7800 Pure hypercholesterolemia, unspecified: Secondary | ICD-10-CM

## 2020-05-23 LAB — HEPATIC FUNCTION PANEL
AG Ratio: 1.8 (calc) (ref 1.0–2.5)
ALT: 9 U/L (ref 6–29)
AST: 15 U/L (ref 10–35)
Albumin: 4.1 g/dL (ref 3.6–5.1)
Alkaline phosphatase (APISO): 95 U/L (ref 37–153)
Bilirubin, Direct: 0.1 mg/dL (ref 0.0–0.2)
Globulin: 2.3 g/dL (calc) (ref 1.9–3.7)
Indirect Bilirubin: 0.4 mg/dL (calc) (ref 0.2–1.2)
Total Bilirubin: 0.5 mg/dL (ref 0.2–1.2)
Total Protein: 6.4 g/dL (ref 6.1–8.1)

## 2020-05-23 LAB — LIPID PANEL
Cholesterol: 200 mg/dL — ABNORMAL HIGH (ref <200)
HDL: 76 mg/dL (ref >=50)
LDL Cholesterol (Calc): 109 mg/dL (calc) — ABNORMAL HIGH
Non-HDL Cholesterol (Calc): 124 mg/dL (calc) (ref <130)
Total CHOL/HDL Ratio: 2.6 (calc) (ref <5.0)
Triglycerides: 68 mg/dL (ref <150)

## 2020-05-23 NOTE — Telephone Encounter (Signed)
I have left message on her voice mail that since she is not on Prednisone, monoclonal antibody inhibitore or chemotherapy then I think she would not be considered for this now but advised her to check with her Pulmonary specialist to confirm.

## 2020-05-23 NOTE — Telephone Encounter (Signed)
Julia Solis [phone]  [name] came for her blood work this morning and was inquiring if her (Sarcoidosis) would qualify her for the booster vaccine early or should she wait until October when it is available for everyone? Would you like me to set up office visit or virtual visit to discuss.

## 2020-06-25 DIAGNOSIS — D2261 Melanocytic nevi of right upper limb, including shoulder: Secondary | ICD-10-CM | POA: Diagnosis not present

## 2020-06-25 DIAGNOSIS — D2272 Melanocytic nevi of left lower limb, including hip: Secondary | ICD-10-CM | POA: Diagnosis not present

## 2020-06-25 DIAGNOSIS — L738 Other specified follicular disorders: Secondary | ICD-10-CM | POA: Diagnosis not present

## 2020-06-25 DIAGNOSIS — Z85828 Personal history of other malignant neoplasm of skin: Secondary | ICD-10-CM | POA: Diagnosis not present

## 2020-06-25 DIAGNOSIS — D2271 Melanocytic nevi of right lower limb, including hip: Secondary | ICD-10-CM | POA: Diagnosis not present

## 2020-06-25 DIAGNOSIS — L821 Other seborrheic keratosis: Secondary | ICD-10-CM | POA: Diagnosis not present

## 2020-06-25 DIAGNOSIS — D2262 Melanocytic nevi of left upper limb, including shoulder: Secondary | ICD-10-CM | POA: Diagnosis not present

## 2020-07-09 ENCOUNTER — Other Ambulatory Visit: Payer: Self-pay | Admitting: Internal Medicine

## 2020-07-10 ENCOUNTER — Encounter: Payer: Self-pay | Admitting: Internal Medicine

## 2020-07-11 ENCOUNTER — Other Ambulatory Visit: Payer: Self-pay

## 2020-07-11 ENCOUNTER — Other Ambulatory Visit: Payer: PPO | Admitting: Internal Medicine

## 2020-07-11 DIAGNOSIS — E7800 Pure hypercholesterolemia, unspecified: Secondary | ICD-10-CM | POA: Diagnosis not present

## 2020-07-11 DIAGNOSIS — Z0000 Encounter for general adult medical examination without abnormal findings: Secondary | ICD-10-CM | POA: Diagnosis not present

## 2020-07-11 DIAGNOSIS — H905 Unspecified sensorineural hearing loss: Secondary | ICD-10-CM

## 2020-07-11 DIAGNOSIS — D869 Sarcoidosis, unspecified: Secondary | ICD-10-CM

## 2020-07-11 DIAGNOSIS — Z1329 Encounter for screening for other suspected endocrine disorder: Secondary | ICD-10-CM

## 2020-07-11 DIAGNOSIS — M8580 Other specified disorders of bone density and structure, unspecified site: Secondary | ICD-10-CM

## 2020-07-11 DIAGNOSIS — G4709 Other insomnia: Secondary | ICD-10-CM | POA: Diagnosis not present

## 2020-07-12 LAB — COMPLETE METABOLIC PANEL WITH GFR
AG Ratio: 1.6 (calc) (ref 1.0–2.5)
ALT: 6 U/L (ref 6–29)
AST: 13 U/L (ref 10–35)
Albumin: 3.9 g/dL (ref 3.6–5.1)
Alkaline phosphatase (APISO): 87 U/L (ref 37–153)
BUN: 17 mg/dL (ref 7–25)
CO2: 24 mmol/L (ref 20–32)
Calcium: 9.2 mg/dL (ref 8.6–10.4)
Chloride: 104 mmol/L (ref 98–110)
Creat: 0.75 mg/dL (ref 0.50–0.99)
GFR, Est African American: 95 mL/min/{1.73_m2} (ref >=60)
GFR, Est Non African American: 82 mL/min/{1.73_m2} (ref >=60)
Globulin: 2.4 g/dL (calc) (ref 1.9–3.7)
Glucose, Bld: 98 mg/dL (ref 65–99)
Potassium: 4.2 mmol/L (ref 3.5–5.3)
Sodium: 141 mmol/L (ref 135–146)
Total Bilirubin: 0.5 mg/dL (ref 0.2–1.2)
Total Protein: 6.3 g/dL (ref 6.1–8.1)

## 2020-07-12 LAB — CBC WITH DIFFERENTIAL/PLATELET
Absolute Monocytes: 528 cells/uL (ref 200–950)
Basophils Absolute: 28 cells/uL (ref 0–200)
Basophils Relative: 0.5 %
Eosinophils Absolute: 270 cells/uL (ref 15–500)
Eosinophils Relative: 4.9 %
HCT: 39 % (ref 35.0–45.0)
Hemoglobin: 12.7 g/dL (ref 11.7–15.5)
Lymphs Abs: 1843 cells/uL (ref 850–3900)
MCH: 28.9 pg (ref 27.0–33.0)
MCHC: 32.6 g/dL (ref 32.0–36.0)
MCV: 88.6 fL (ref 80.0–100.0)
MPV: 10.8 fL (ref 7.5–12.5)
Monocytes Relative: 9.6 %
Neutro Abs: 2833 cells/uL (ref 1500–7800)
Neutrophils Relative %: 51.5 %
Platelets: 187 10*3/uL (ref 140–400)
RBC: 4.4 10*6/uL (ref 3.80–5.10)
RDW: 13.1 % (ref 11.0–15.0)
Total Lymphocyte: 33.5 %
WBC: 5.5 10*3/uL (ref 3.8–10.8)

## 2020-07-12 LAB — LIPID PANEL
Cholesterol: 186 mg/dL (ref <200)
HDL: 70 mg/dL (ref >=50)
LDL Cholesterol (Calc): 101 mg/dL (calc) — ABNORMAL HIGH
Non-HDL Cholesterol (Calc): 116 mg/dL (calc) (ref <130)
Total CHOL/HDL Ratio: 2.7 (calc) (ref <5.0)
Triglycerides: 64 mg/dL (ref <150)

## 2020-07-12 LAB — TSH: TSH: 2.51 mIU/L (ref 0.40–4.50)

## 2020-07-15 ENCOUNTER — Encounter: Payer: PPO | Admitting: Internal Medicine

## 2020-07-15 ENCOUNTER — Telehealth: Payer: Self-pay | Admitting: Internal Medicine

## 2020-07-15 NOTE — Telephone Encounter (Signed)
OK not sure when we can reschedule

## 2020-07-15 NOTE — Telephone Encounter (Signed)
Julia Solis [phone]  [name] babysat for her granddaughter last night, and she received a phone this morning that her granddaughter had woke up in the middle of the night with fever, coughing and not feeling well. She had left her around 9:30 pm. Her granddaughter had been exposed to [employer] at day care a week ago, she had tested  negative twice with home test, but is now having symptoms, so [name] thinks it may be safer for her not to come in for her physical this afternoon and possibly expose [place]. Her granddaughter did test negatitive today on a home test and is going to doctor this afternoon. She is willing to do what ever you need to reschedule, since her granddaughter had to negative COVID test, she thought she was safe to baby set.

## 2020-07-16 NOTE — Telephone Encounter (Signed)
Reschedule CPE

## 2020-07-29 ENCOUNTER — Other Ambulatory Visit (HOSPITAL_COMMUNITY): Payer: Self-pay | Admitting: Pulmonary Disease

## 2020-07-29 DIAGNOSIS — I272 Pulmonary hypertension, unspecified: Secondary | ICD-10-CM

## 2020-08-14 ENCOUNTER — Ambulatory Visit (INDEPENDENT_AMBULATORY_CARE_PROVIDER_SITE_OTHER): Payer: PPO | Admitting: Internal Medicine

## 2020-08-14 ENCOUNTER — Other Ambulatory Visit: Payer: Self-pay

## 2020-08-14 ENCOUNTER — Encounter: Payer: Self-pay | Admitting: Internal Medicine

## 2020-08-14 VITALS — BP 120/80 | HR 66 | Ht 64.0 in | Wt 187.0 lb

## 2020-08-14 DIAGNOSIS — Z0000 Encounter for general adult medical examination without abnormal findings: Secondary | ICD-10-CM

## 2020-08-14 DIAGNOSIS — D860 Sarcoidosis of lung: Secondary | ICD-10-CM

## 2020-08-14 DIAGNOSIS — R3129 Other microscopic hematuria: Secondary | ICD-10-CM

## 2020-08-14 DIAGNOSIS — R0602 Shortness of breath: Secondary | ICD-10-CM

## 2020-08-14 DIAGNOSIS — G4709 Other insomnia: Secondary | ICD-10-CM

## 2020-08-14 DIAGNOSIS — Z8543 Personal history of malignant neoplasm of ovary: Secondary | ICD-10-CM

## 2020-08-14 DIAGNOSIS — H905 Unspecified sensorineural hearing loss: Secondary | ICD-10-CM | POA: Diagnosis not present

## 2020-08-14 DIAGNOSIS — Z87442 Personal history of urinary calculi: Secondary | ICD-10-CM

## 2020-08-14 DIAGNOSIS — I499 Cardiac arrhythmia, unspecified: Secondary | ICD-10-CM

## 2020-08-14 LAB — POCT URINALYSIS DIPSTICK
Appearance: NEGATIVE
Bilirubin, UA: NEGATIVE
Glucose, UA: NEGATIVE
Ketones, UA: NEGATIVE
Leukocytes, UA: NEGATIVE
Nitrite, UA: NEGATIVE
Odor: NEGATIVE
Protein, UA: POSITIVE — AB
Spec Grav, UA: 1.02 (ref 1.010–1.025)
Urobilinogen, UA: 0.2 E.U./dL
pH, UA: 6.5 (ref 5.0–8.0)

## 2020-08-14 NOTE — Progress Notes (Signed)
Subjective:    Patient ID: Julia Solis, female    DOB: 1951/04/16, 69 y.o.   MRN: [id number]  HPI 69 year old Female for health maintenance exam and evaluation of medical issues. Has been more SOB with walking and going up stairs past couple of weeks.  Hx of pulmonary sarcoidosis with appt in Feb with Pulmonologist at [facility]. Has Echocardiogram scheduled for early February.  Patient reports no shortness of breath recently.  Sees Dr.Bellinger at [facility] for pulmonary sarcoidosis and has had extensive evaluation.  She had bone biopsy showing granulomatous inflammation.  She has known since the 1990s that she had an abnormal chest x-ray.  Uptake has been noted on PET scan lymph node, spleen and bones.  Has mild pulmonary hypertension.  She was diagnosed in 2019 after having a renal stone and CT suggested lymphadenopathy.  Had abnormal PET scan and was subsequently diagnosed with sarcoidosis.  She had lithotripsy for treatment of stone in 2019.  Does use oxygen overnight.  Pulse oximetry generally ranges between 92 to 94% at night with oxygen.  History of lumbar disc disease status post surgery by Dr. [name] in 1997.  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  Additional past medical history: Ectopic pregnancy 1980, benign left breast biopsy 1993.  C-sections in 1992 in 1995.  Ganglion cyst removed from wrist in 1975.  Tonsillectomy 1957.  Brow lift in 2008.  History of ovarian cancer 1C-grade 3 clear cell and is status post debulking surgery March 2011 by Dr. [name].  Had total abdominal hysterectomy, BSO and bilateral pelvic node dissection.  Had aortic node sampling, omentectomy, appendectomy and biopsies.  Lymph nodes were negative for malignancy.  Symptoms started February 2011 with abdominal gas and bloating.  CT of the chest at that time showed tiny nodules thought to be benign but in retrospect may have been sarcoidosis.  Social  history: She is divorced and has 2 adult daughters.  Does not smoke but did smoke from the ages of 89 through 26.  Social alcohol consumption.  Has had some issues with insomnia and has tried Ambien and lorazepam.  History of mild vitamin D deficiency.  Colonoscopy done by Dr. Earle Gell in 2012 apparently was normal.  Family history: Father died at age 46 with prostate cancer.  Brother died at age 80 of an AV malformation rupture.  1 sister.   He has moderate blood on urine dipstick.  3-10 red blood cells per high-powered field.  Urine culture was negative.     Review of Systems complaint of shortness of breath     Objective:   Physical Exam She is afebrile.  Blood pressure 120/80 pulse 66 pulse oximetry 97% weight 187 pounds BMI 32.10  Skin warm and dry.  No cervical adenopathy.  Chest is clear.  No JVD thyromegaly or carotid bruits.  Cardiac exam.  Regular rhythm.  EKG does not show atrial fibrillation.  Rate on EKG is 50.  Abdomen soft nondistended without hepatosplenomegaly masses or tenderness.  No lower extremity pitting edema.  Brief neurological exam is intact without focal deficits.  Affect felt judgment are normal.       Assessment & Plan:  Pulmonary sarcoidosis followed by Dr. Lowry Ram at [facility].  Scheduled for echocardiogram in the near future .  History of kidney stones and patient has microscopic hematuria without evidence of infection  She is having irregular heart rhythm.  Refer to cardiology  for evaluation.  Need to see if she has dysrhythmia in the setting of sarcoidosis.  History of ovarian cancer  Pure hypercholesterolemia-to start Crestor 5 mg 3 times a week with supper  Plan: See above-she is scheduled to have CT soon at [facility] and follow-up on sarcoidosis.  Can see if she has kidney stones at that time.  If so, she will be referred to Urologist.  Holter monitor to be ordered for irregular  rhythm.  Subjective:   Patient presents for Medicare Annual/Subsequent preventive examination.  Review Past Medical/Family/Social: See above   Risk Factors  Current exercise habits: Walks Dietary issues discussed: Low-fat low carbohydrate  Cardiac risk factors: Pure hypercholesterolemia  Depression Screen  (Note: if answer to either of the following is "Yes", a more complete depression screening is indicated)   Over the past two weeks, have you felt down, depressed or hopeless? No  Over the past two weeks, have you felt little interest or pleasure in doing things? No Have you lost interest or pleasure in daily life? No Do you often feel hopeless? No Do you cry easily over simple problems? No   Activities of Daily Living  In your present state of health, do you have any difficulty performing the following activities?:   Driving? No  Managing money? No  Feeding yourself? No  Getting from bed to chair? No  Climbing a flight of stairs? No  Preparing food and eating?: No  Bathing or showering? No  Getting dressed: No  Getting to the toilet? No  Using the toilet:No  Moving around from place to place: No  In the past year have you fallen or had a near fall?:No  Are you sexually active? No  Do you have more than one partner?  Not answered  Hearing Difficulties: No  Do you often ask people to speak up or repeat themselves?  Sometimes Do you experience ringing or noises in your ears? No  Do you have difficulty understanding soft or whispered voices?  Sometimes Do you feel that you have a problem with memory? No Do you often misplace items? No    Home Safety:  Do you have a smoke alarm at your residence? Yes Do you have grab bars in the bathroom?  Yes Do you have throw rugs in your house?  Yes   Cognitive Testing  Alert? Yes Normal Appearance?Yes  Oriented to person? Yes Place? Yes  Time? Yes  Recall of three objects? Yes  Can perform simple calculations? Yes   Displays appropriate judgment?Yes  Can read the correct time from a watch face?Yes   List the Names of Other Physician/Practitioners you currently use:  See referral list for the physicians patient is currently seeing.   Dr. Girard Cooter [facility]  Review of Systems:see above   Objective:     General appearance: Appears stated age  Head: Normocephalic, without obvious abnormality, atraumatic  Eyes: conj clear, EOMi PEERLA  Ears: normal TM's and external ear canals both ears  Nose: Nares normal. Septum midline. Mucosa normal. No drainage or sinus tenderness.  Throat: lips, mucosa, and tongue normal; teeth and gums normal  Neck: no adenopathy, no carotid bruit, no JVD, supple, symmetrical, trachea midline and thyroid not enlarged, symmetric, no tenderness/mass/nodules  No CVA tenderness.  Lungs: clear to auscultation bilaterally  Breasts: normal appearance, no masses or tenderness Heart: regular rate and rhythm, S1, S2 normal, no murmur, click, rub or gallop  Abdomen: soft, non-tender; bowel sounds normal; no masses,  no organomegaly  Musculoskeletal: ROM normal in all joints, no crepitus, no deformity, Normal muscle strengthen. Back  is symmetric, no curvature. Skin: Skin color, texture, turgor normal. No rashes or lesions  Lymph nodes: Cervical, supraclavicular, and axillary nodes normal.  Neurologic: CN 2 -12 Normal, Normal symmetric reflexes. Normal coordination and gait  Psych: Alert & Oriented x 3, Mood appear stable.    Assessment:    Annual wellness medicare exam   Plan:    During the course of the visit the patient was educated and counseled about appropriate screening and preventive services including:   Mammogram will be due in January 2022  Needs follow-up on colon cancer screening  Has had 3 Moderna vaccines   Patient Instructions (the written plan) was given to the patient.  Medicare Attestation  I have personally reviewed:  The patient's medical and social  history  Their use of alcohol, tobacco or illicit drugs  Their current medications and supplements  The patient's functional ability including ADLs,fall risks, home safety risks, cognitive, and hearing and visual impairment  Diet and physical activities  Evidence for depression or mood disorders  The patient's weight, height, BMI, and visual acuity have been recorded in the chart. I have made referrals, counseling, and provided education to the patient based on review of the above and I have provided the patient with a written personalized care plan for preventive services.

## 2020-08-14 NOTE — Patient Instructions (Addendum)
Serum BNP drawn. Monitor BP at home. Please contact Pulmonologist about SOB. Cardiology referral.  Holter monitor ordered.  Crestor 5 mg 3 times a week.

## 2020-08-15 LAB — BRAIN NATRIURETIC PEPTIDE: Brain Natriuretic Peptide: 70 pg/mL (ref <100)

## 2020-08-15 LAB — MICROALBUMIN / CREATININE URINE RATIO
Creatinine, Urine: 230 mg/dL (ref 20–275)
Microalb Creat Ratio: 101 mcg/mg creat — ABNORMAL HIGH (ref <30)
Microalb, Ur: 23.3 mg/dL

## 2020-08-16 ENCOUNTER — Telehealth: Payer: Self-pay | Admitting: Internal Medicine

## 2020-08-16 NOTE — Telephone Encounter (Signed)
Julia Solis [phone]  [name] called to see if you wanted to talk with her since there was blood in her urine and she had history of kidney stones?

## 2020-08-16 NOTE — Telephone Encounter (Signed)
Book appt next week to evaluate and discuss. Will need to give another specimen at that time. We were sidetracked with her lung condition at last visit.

## 2020-08-16 NOTE — Telephone Encounter (Signed)
Scheduled

## 2020-08-21 DIAGNOSIS — D869 Sarcoidosis, unspecified: Secondary | ICD-10-CM | POA: Diagnosis not present

## 2020-08-21 DIAGNOSIS — D739 Disease of spleen, unspecified: Secondary | ICD-10-CM | POA: Diagnosis not present

## 2020-08-21 DIAGNOSIS — R160 Hepatomegaly, not elsewhere classified: Secondary | ICD-10-CM | POA: Diagnosis not present

## 2020-08-21 DIAGNOSIS — R0602 Shortness of breath: Secondary | ICD-10-CM | POA: Diagnosis not present

## 2020-08-21 DIAGNOSIS — E042 Nontoxic multinodular goiter: Secondary | ICD-10-CM | POA: Diagnosis not present

## 2020-08-21 DIAGNOSIS — I272 Pulmonary hypertension, unspecified: Secondary | ICD-10-CM | POA: Diagnosis not present

## 2020-08-22 ENCOUNTER — Ambulatory Visit (INDEPENDENT_AMBULATORY_CARE_PROVIDER_SITE_OTHER): Payer: PPO | Admitting: Internal Medicine

## 2020-08-22 ENCOUNTER — Other Ambulatory Visit: Payer: Self-pay

## 2020-08-22 ENCOUNTER — Encounter: Payer: Self-pay | Admitting: Internal Medicine

## 2020-08-22 VITALS — BP 120/70 | HR 60 | Temp 98.3°F | Ht 64.0 in | Wt 186.0 lb

## 2020-08-22 DIAGNOSIS — R319 Hematuria, unspecified: Secondary | ICD-10-CM

## 2020-08-22 DIAGNOSIS — I499 Cardiac arrhythmia, unspecified: Secondary | ICD-10-CM

## 2020-08-22 LAB — POCT URINALYSIS DIPSTICK
Appearance: NEGATIVE
Bilirubin, UA: NEGATIVE
Glucose, UA: NEGATIVE
Ketones, UA: NEGATIVE
Leukocytes, UA: NEGATIVE
Nitrite, UA: NEGATIVE
Odor: NEGATIVE
Protein, UA: POSITIVE — AB
Spec Grav, UA: 1.02 (ref 1.010–1.025)
Urobilinogen, UA: 0.2 E.U./dL
pH, UA: 6.5 (ref 5.0–8.0)

## 2020-08-22 NOTE — Progress Notes (Signed)
   Subjective:    Patient ID: Julia Solis, female    DOB: 07-15-1951, 69 y.o.   MRN: [id number]  HPI 69 year old Female seen today for follow up on hematuria and proteinuria which were noted on November 10. She had no evidence of urinary tract infection symptoms. She has a history of sarcoidosis. Has history of kidney stones requiring lithotripsy in the remote past.  Today urine dipstick shows moderate occult blood. Specimen was sent for microscopic evaluation and she was found to have few bacteria and 3-10 red blood cells per high-powered field. Urine culture revealed no growth.  Review of Systems see above     Objective:   Physical Exam  No CVA tenderness. No fever. 3-10 red blood cells per high-powered field. Culture was negative.      Assessment & Plan:  She has an upcoming appointment for abdominal CT at [facility] to evaluate sarcoidosis. We can see if she has kidney stones at that point in time.  She has remote history of kidney stones requiring lithotripsy. This may be similar issue.

## 2020-08-23 LAB — URINALYSIS, MICROSCOPIC ONLY: Hyaline Cast: NONE SEEN /LPF

## 2020-08-23 LAB — URINE CULTURE
MICRO NUMBER:: 11220967
Result:: NO GROWTH
SPECIMEN QUALITY:: ADEQUATE

## 2020-08-28 ENCOUNTER — Telehealth: Payer: Self-pay | Admitting: Internal Medicine

## 2020-08-28 DIAGNOSIS — R911 Solitary pulmonary nodule: Secondary | ICD-10-CM | POA: Diagnosis not present

## 2020-08-28 DIAGNOSIS — N132 Hydronephrosis with renal and ureteral calculous obstruction: Secondary | ICD-10-CM | POA: Diagnosis not present

## 2020-08-28 DIAGNOSIS — R591 Generalized enlarged lymph nodes: Secondary | ICD-10-CM | POA: Diagnosis not present

## 2020-08-28 DIAGNOSIS — J479 Bronchiectasis, uncomplicated: Secondary | ICD-10-CM | POA: Diagnosis not present

## 2020-08-28 NOTE — Telephone Encounter (Signed)
Phone call from patient at 5 pm regarding her CT results which was done for hx of sarcoidosis. Recently found to have 3-10 RBCs per HPF on microscopic. Saw slight amount of blood giving specimen here. Culture negative for UTI.   Has hx left kidney stones. Has obstructing 8 mm stone left UPJ with mild left hydroureteronephrosis. And an additional 20mm calculus left ureter. Will refer back to [facility]. Have sent fax copy of CT abdomen from [facility] where she is seen for sarcoidosis to [facility] where she has had lithotripsy in the past. MJB,MD

## 2020-08-30 ENCOUNTER — Ambulatory Visit (INDEPENDENT_AMBULATORY_CARE_PROVIDER_SITE_OTHER): Payer: PPO

## 2020-08-30 DIAGNOSIS — R001 Bradycardia, unspecified: Secondary | ICD-10-CM

## 2020-08-30 DIAGNOSIS — I499 Cardiac arrhythmia, unspecified: Secondary | ICD-10-CM | POA: Diagnosis not present

## 2020-08-30 DIAGNOSIS — R9431 Abnormal electrocardiogram [ECG] [EKG]: Secondary | ICD-10-CM

## 2020-09-01 ENCOUNTER — Encounter: Payer: Self-pay | Admitting: Internal Medicine

## 2020-09-01 NOTE — Patient Instructions (Signed)
3-10 red blood cells per high-powered field noted on urine microscopic but negative culture. No evidence of UTI. Could have kidney stones. Patient will advise [place] when CT of abdomen done at [facility] so we can review for possible kidney stones.

## 2020-09-02 ENCOUNTER — Telehealth: Payer: Self-pay

## 2020-09-02 DIAGNOSIS — D869 Sarcoidosis, unspecified: Secondary | ICD-10-CM | POA: Diagnosis not present

## 2020-09-02 DIAGNOSIS — R0609 Other forms of dyspnea: Secondary | ICD-10-CM | POA: Diagnosis not present

## 2020-09-02 DIAGNOSIS — F17210 Nicotine dependence, cigarettes, uncomplicated: Secondary | ICD-10-CM | POA: Diagnosis not present

## 2020-09-02 NOTE — Telephone Encounter (Signed)
Patient wanted you to know that she has an appointment with [facility] on 12/09.

## 2020-09-06 ENCOUNTER — Telehealth: Payer: Self-pay

## 2020-09-06 NOTE — Telephone Encounter (Signed)
Scheduled next week, she wanted you to know that she has an echo scheduled on 09/16/20

## 2020-09-06 NOTE — Telephone Encounter (Signed)
Patient called would like to speak with you about a lot of tests that have been ordered by her cardiologists but she forgot to mention that she had a booster shot a few days before she started having irregular heart beat and she has a friend who got the booster shot and started experiencing the same symptoms after getting the covid booster she is wondering if her heart symptoms are related to the booster shot bc she read that the COVID injection can cause myocarditis.   Also her pulmonologist prescribed an inhaler and the inhaler makes hear rate fast.  She is requesting a call back from you.

## 2020-09-06 NOTE — Telephone Encounter (Signed)
I would like for her to have an appointment to go over these questions

## 2020-09-07 ENCOUNTER — Other Ambulatory Visit: Payer: Self-pay | Admitting: Internal Medicine

## 2020-09-09 DIAGNOSIS — R001 Bradycardia, unspecified: Secondary | ICD-10-CM | POA: Diagnosis not present

## 2020-09-09 DIAGNOSIS — R9431 Abnormal electrocardiogram [ECG] [EKG]: Secondary | ICD-10-CM | POA: Diagnosis not present

## 2020-09-09 DIAGNOSIS — I499 Cardiac arrhythmia, unspecified: Secondary | ICD-10-CM | POA: Diagnosis not present

## 2020-09-09 DIAGNOSIS — D869 Sarcoidosis, unspecified: Secondary | ICD-10-CM | POA: Diagnosis not present

## 2020-09-10 ENCOUNTER — Ambulatory Visit (INDEPENDENT_AMBULATORY_CARE_PROVIDER_SITE_OTHER): Payer: PPO | Admitting: Internal Medicine

## 2020-09-10 ENCOUNTER — Encounter: Payer: Self-pay | Admitting: Internal Medicine

## 2020-09-10 ENCOUNTER — Other Ambulatory Visit: Payer: Self-pay | Admitting: Internal Medicine

## 2020-09-10 ENCOUNTER — Other Ambulatory Visit: Payer: Self-pay

## 2020-09-10 VITALS — BP 150/80 | HR 49 | Temp 97.7°F | Ht 64.0 in | Wt 185.0 lb

## 2020-09-10 DIAGNOSIS — Z8543 Personal history of malignant neoplasm of ovary: Secondary | ICD-10-CM

## 2020-09-10 DIAGNOSIS — D869 Sarcoidosis, unspecified: Secondary | ICD-10-CM | POA: Diagnosis not present

## 2020-09-10 DIAGNOSIS — I471 Supraventricular tachycardia: Secondary | ICD-10-CM | POA: Diagnosis not present

## 2020-09-10 DIAGNOSIS — R001 Bradycardia, unspecified: Secondary | ICD-10-CM | POA: Diagnosis not present

## 2020-09-10 DIAGNOSIS — R0602 Shortness of breath: Secondary | ICD-10-CM

## 2020-09-10 DIAGNOSIS — R3129 Other microscopic hematuria: Secondary | ICD-10-CM | POA: Diagnosis not present

## 2020-09-10 DIAGNOSIS — R030 Elevated blood-pressure reading, without diagnosis of hypertension: Secondary | ICD-10-CM | POA: Diagnosis not present

## 2020-09-10 DIAGNOSIS — N200 Calculus of kidney: Secondary | ICD-10-CM

## 2020-09-10 DIAGNOSIS — R9431 Abnormal electrocardiogram [ECG] [EKG]: Secondary | ICD-10-CM

## 2020-09-10 DIAGNOSIS — I455 Other specified heart block: Secondary | ICD-10-CM | POA: Diagnosis not present

## 2020-09-10 DIAGNOSIS — I499 Cardiac arrhythmia, unspecified: Secondary | ICD-10-CM

## 2020-09-10 NOTE — Progress Notes (Signed)
   Subjective:    Patient ID: Julia Solis, female    DOB: [birth year], 69 y.o.   MRN: [id number]  HPI 69 year old Female seen for follow up on results of recent long-term  heart monitor. Hx of sarcoidosis.  Followed at [facility].  Recently seen for Medicare wellness and health maintenance exam on November 10 and mentioned shortness of breath.  Was found to have bradycardia with rate of 50.  EKG did not show atrial fibrillation.  Stated she felt tachycardic at times.  Wore a cardiac monitor for several days and was found to have SVT fastest 10 beats at 193 bpm and longest 12 beats at 121 bpm.  2 episodes of shortness of breath associated with sinus and junctional rhythm with rate of 50.  More concerning was 5 sinus pauses longest being 3.6 seconds and asymptomatic.    In Jan 2020, saw Dr. Rayann Heman for palpitations for PACs and infrequent PVCs.    Will be seen at [facility] on December 20.    Echocardiogram scheduled for December 13  Recently had CT scan per pulmonologist at [facility] and was found to have 8 and 4 mm calculi in left kidney with mild hydronephrosis.  She needs to be seen at [facility].  This likely explains her mild hematuria.  She is currently asymptomatic.    Review of Systems no new complaints except some SOB with walking and going up stairs.     Objective:   Physical Exam Patient is a bit anxious today.  Blood pressure 150/80.  Pulse is low at 49 but she is currently asymptomatic.  BMI 31.76 pulse oximetry 96% weight 185 pounds.  Her chest is clear.  Cardiac exam bradycardia regular rate and rhythm.  No lower extremity pitting edema.  No JVD.       Assessment & Plan:  Elevated blood pressure reading-patient is anxious today and will continue to monitor her blood pressure at home  History of left kidney stones with mild hydronephrosis -to see urologist  Sinus pause and junctional bradycardia-to see Dr.  Rayann Heman in the near future.  Today  is not symptomatic although the rate is 49.  Sarcoidosis-has follow-up with pulmonologist at [facility] in January.

## 2020-09-12 DIAGNOSIS — R3121 Asymptomatic microscopic hematuria: Secondary | ICD-10-CM | POA: Diagnosis not present

## 2020-09-12 DIAGNOSIS — N202 Calculus of kidney with calculus of ureter: Secondary | ICD-10-CM | POA: Diagnosis not present

## 2020-09-12 DIAGNOSIS — N201 Calculus of ureter: Secondary | ICD-10-CM | POA: Diagnosis not present

## 2020-09-16 ENCOUNTER — Other Ambulatory Visit: Payer: Self-pay

## 2020-09-16 ENCOUNTER — Ambulatory Visit (HOSPITAL_COMMUNITY)
Admission: RE | Admit: 2020-09-16 | Discharge: 2020-09-16 | Disposition: A | Discharge status: Home / Self Care | Payer: PPO | Source: Ambulatory Visit | Attending: Pulmonary Disease | Admitting: Pulmonary Disease

## 2020-09-16 ENCOUNTER — Other Ambulatory Visit (HOSPITAL_COMMUNITY): Payer: PPO

## 2020-09-16 DIAGNOSIS — R0602 Shortness of breath: Secondary | ICD-10-CM | POA: Diagnosis not present

## 2020-09-16 DIAGNOSIS — I420 Dilated cardiomyopathy: Secondary | ICD-10-CM | POA: Diagnosis not present

## 2020-09-16 DIAGNOSIS — I10 Essential (primary) hypertension: Secondary | ICD-10-CM | POA: Diagnosis not present

## 2020-09-16 DIAGNOSIS — I272 Pulmonary hypertension, unspecified: Secondary | ICD-10-CM | POA: Diagnosis not present

## 2020-09-16 LAB — ECHOCARDIOGRAM COMPLETE
Area-P 1/2: 2.99 cm2
S' Lateral: 2.8 cm

## 2020-09-23 ENCOUNTER — Other Ambulatory Visit: Payer: Self-pay

## 2020-09-23 ENCOUNTER — Ambulatory Visit: Payer: PPO | Admitting: Internal Medicine

## 2020-09-23 ENCOUNTER — Encounter: Payer: Self-pay | Admitting: Internal Medicine

## 2020-09-23 VITALS — BP 150/84 | HR 56 | Ht 64.0 in | Wt 186.0 lb

## 2020-09-23 DIAGNOSIS — R001 Bradycardia, unspecified: Secondary | ICD-10-CM

## 2020-09-23 DIAGNOSIS — R0602 Shortness of breath: Secondary | ICD-10-CM

## 2020-09-23 DIAGNOSIS — I471 Supraventricular tachycardia: Secondary | ICD-10-CM | POA: Diagnosis not present

## 2020-09-23 NOTE — Patient Instructions (Addendum)
Medication Instructions:  Your physician recommends that you continue on your current medications as directed. Please refer to the Current Medication list given to you today.  *If you need a refill on your cardiac medications before your next appointment, please call your pharmacy*  Lab Work: None ordered.  If you have labs (blood work) drawn today and your tests are completely normal, you will receive your results only by:  [place] (if you have MyChart) OR  A paper copy in the mail If you have any lab test that is abnormal or we need to change your treatment, we will call you to review the results.  Testing/Procedures: Schedule treadmill stress test Your physician has requested that you have an exercise tolerance test. For further information please visit [email or web address]. Please also follow instruction sheet, as given.    Follow-Up: At [facility], you and your health needs are our priority.  As part of our continuing mission to provide you with exceptional heart care, we have created designated Provider Care Teams.  These Care Teams include your primary Cardiologist (physician) and Advanced Practice Providers (APPs -  Physician Assistants and Nurse Practitioners) who all work together to provide you with the care you need, when you need it.  We recommend signing up for the patient portal called "MyChart".  Sign up information is provided on this After Visit Summary.  MyChart is used to connect with patients for Virtual Visits (Telemedicine).  Patients are able to view lab/test results, encounter notes, upcoming appointments, etc.  Non-urgent messages can be sent to your provider as well.   To learn more about what you can do with MyChart, go to [email or web address].    Your next appointment:   Your physician wants you to follow-up in: 6 weeks with Dr. Rayann Heman.    Other Instructions:

## 2020-09-23 NOTE — Progress Notes (Signed)
PCP: Elby Showers, MD   Primary EP: Dr Richarda Blade Julia Solis is a 69 y.o. female who presents today for routine electrophysiology followup.  Since last being seen in our clinic, the patient reports doing reasonably well.  She has developed frequent palpitations as well as SOB.  She was evaluated by Dr Renold Genta and had an event monitor placed.  She has been found to have pacs and short episodes of atrial tachycardia.  She was also found to have frequent sinus bradycardia with daytime heart rates as low as 30s.  She did have a symptomatic submission for SOB and this corresponded to junctional bradycardia 40s. She has noticed worsening exertional SOB for several months.  She notes that this seemed to initially occur after her COVID vaccine.   Today, she denies symptoms of palpitations, chest pain dizziness, presyncope, or syncope.  The patient is otherwise without complaint today.   Past Medical History:  Diagnosis Date  . Cancer ([place]) 2011   ovarian  . Complication of anesthesia   . History of chemotherapy   . History of kidney stones   . PONV (postoperative nausea and vomiting)    Past Surgical History:  Procedure Laterality Date  . ABDOMINAL HYSTERECTOMY  2011  . BACK SURGERY  1997  . [place]  . COSMETIC SURGERY    . etopic preg    . EXTRACORPOREAL SHOCK WAVE LITHOTRIPSY Left 06/23/2018   Procedure: LEFT EXTRACORPOREAL SHOCK WAVE LITHOTRIPSY (ESWL);  Surgeon: Cleon Gustin, MD;  Location: [facility] ORS;  Service: Urology;  Laterality: Left;  . GANGLION CYST EXCISION  1975  . HAND SURGERY     right  . TONSILLECTOMY  1957    ROS- all systems are reviewed and negatives except as per HPI above  Current Outpatient Medications  Medication Sig Dispense Refill  . calcium carbonate (TUMS - DOSED IN MG ELEMENTAL CALCIUM) 500 MG chewable tablet Chew 1 tablet by mouth daily.    . cholecalciferol (VITAMIN D) 1000 units tablet Take 2,000 Units by mouth daily.     Marland Kitchen  DICLOFENAC SODIUM IV GEL    . famotidine (PEPCID) 10 MG tablet Take 10 mg by mouth as needed.    . Loratadine 10 MG CAPS Take 10 mg by mouth daily. Reported on 11/13/2015    . LORazepam (ATIVAN) 1 MG tablet TAKE 1 TABLET BY MOUTH AT BEDTIME AS NEEDED 30 tablet 5  . meloxicam (MOBIC) 15 MG tablet TAKE 1 TABLET BY MOUTH EVERY DAY (Patient taking differently: Take 15 mg by mouth as needed.) 90 tablet 0  . Polyethyl Glycol-Propyl Glycol (SYSTANE OP) Apply 1 drop to eye daily. Reported on 11/13/2015    . polyethylene glycol (MIRALAX / GLYCOLAX) packet Take 17 g by mouth as needed. Reported on 11/13/2015    . rosuvastatin (CRESTOR) 5 MG tablet TAKE 1 TABLET BY MOUTH 3 TIMES A WEEK WITH SUPPER 36 tablet 1   No current facility-administered medications for this visit.    Physical Exam: Vitals:   09/23/20 0921  BP: (!) 150/84  Pulse: (!) 56  SpO2: 97%  Weight: 186 lb (84.4 kg)  Height: 5\' 4"  (1.626 m)    GEN- The patient is well appearing, alert and oriented x 3 today.   Head- normocephalic, atraumatic Eyes-  Sclera clear, conjunctiva pink Ears- hearing intact Oropharynx- clear Lungs- Clear to ausculation bilaterally, normal work of breathing Heart- Regular rate and rhythm, no murmurs, rubs or gallops, PMI not laterally  displaced GI- soft, NT, ND, + BS Extremities- no clubbing, cyanosis, or edema  Wt Readings from Last 3 Encounters:  09/23/20 186 lb (84.4 kg)  09/10/20 185 lb (83.9 kg)  08/22/20 186 lb (84.4 kg)    EKG tracing ordered today is personally reviewed and shows junctional rhythm (40s) followed by ectopic atrial rhythm  Echo 09/16/20- EF 37%, grade I diastolic dysfunction. RV PAS 34, mild LA enlargement  Event monitor- nonsustained atach, occasional post termination pauses  Assessment and Plan:  1. PACs/ nonsustained atach Medical therapy limited by bradycardia  2. SOB with exertion Unclear etiology I will order exercise myoview to evaluate her heart rate response to  activity and evaluate for ischemia  3. Bradycardia She has bradycardia with symptoms.  I do not see a reversible cause.  We discussed the likelihood of needing a PPM.  She would like to avoid this for now.  She has had pauses but no presyncope/ syncope. She will document heart rates when SOB and also proceed with exercise treadmill (as above)   Risks, benefits and potential toxicities for medications prescribed and/or refilled reviewed with patient today.   Return in 6 weeks  If her symptoms worsen in the interim, she will contact my office.  Thompson Grayer MD, [facility] 09/23/2020 10:11 AM

## 2020-10-03 DIAGNOSIS — N202 Calculus of kidney with calculus of ureter: Secondary | ICD-10-CM | POA: Diagnosis not present

## 2020-10-04 NOTE — Patient Instructions (Signed)
Patient will follow up with cardiologist regarding sinus pauses and junctional bradycardia.  Monitor blood pressure at home and let me know if persistently elevated.  Follow-up with pulmonologist at [facility].  Please see urologist at [facility]rology regarding left-sided kidney stones with mild hydronephrosis.

## 2020-10-07 ENCOUNTER — Other Ambulatory Visit (HOSPITAL_COMMUNITY)
Admission: RE | Admit: 2020-10-07 | Discharge: 2020-10-07 | Disposition: A | Discharge status: Home / Self Care | Payer: PPO | Source: Ambulatory Visit | Attending: Cardiovascular Disease | Admitting: Cardiovascular Disease

## 2020-10-07 DIAGNOSIS — Z01812 Encounter for preprocedural laboratory examination: Secondary | ICD-10-CM | POA: Diagnosis not present

## 2020-10-07 DIAGNOSIS — Z20822 Contact with and (suspected) exposure to covid-19: Secondary | ICD-10-CM | POA: Diagnosis not present

## 2020-10-07 LAB — SARS CORONAVIRUS 2 (TAT 6-24 HRS): SARS Coronavirus 2: NEGATIVE

## 2020-10-09 ENCOUNTER — Telehealth (HOSPITAL_COMMUNITY): Payer: Self-pay | Admitting: *Deleted

## 2020-10-09 NOTE — Telephone Encounter (Signed)
Close encounter 

## 2020-10-10 ENCOUNTER — Other Ambulatory Visit: Payer: Self-pay

## 2020-10-10 ENCOUNTER — Ambulatory Visit (HOSPITAL_COMMUNITY)
Admission: RE | Admit: 2020-10-10 | Discharge: 2020-10-10 | Disposition: A | Discharge status: Home / Self Care | Payer: PPO | Source: Ambulatory Visit | Attending: Cardiovascular Disease | Admitting: Cardiovascular Disease

## 2020-10-10 DIAGNOSIS — R001 Bradycardia, unspecified: Secondary | ICD-10-CM | POA: Diagnosis not present

## 2020-10-10 DIAGNOSIS — I471 Supraventricular tachycardia: Secondary | ICD-10-CM | POA: Insufficient documentation

## 2020-10-10 LAB — EXERCISE TOLERANCE TEST
Estimated workload: 7.2 METS
Exercise duration (min): 6 min
Exercise duration (sec): 9 s
MPHR: 152 {beats}/min
Peak HR: 118 {beats}/min
Percent HR: 77 %
Rest HR: 90 {beats}/min

## 2020-10-17 ENCOUNTER — Telehealth: Payer: Self-pay | Admitting: Internal Medicine

## 2020-10-17 NOTE — Telephone Encounter (Signed)
Called patient and she is going to stop cold medications and monitor BP, will call back if still elevated.

## 2020-10-17 NOTE — Telephone Encounter (Signed)
These meds can cause elevated BP.  She should stop these and follow  her BP. A visit is not needed at this time.

## 2020-10-17 NOTE — Telephone Encounter (Signed)
Julia Solis [phone]  [name] called to say her blood pressure has been elevated , but she has been taking Dayquil and Nyquil, because she has had some nasal congestion since Saturday, she has done 2 at home COVID test that were negative. She is thinking she may need to be seen.  10/04/2020  137/88 10/14/2020  152/82 10/15/2020  139/80 10/16/2020  146/82 10/17/2020  168/98

## 2020-10-28 ENCOUNTER — Other Ambulatory Visit: Payer: Self-pay

## 2020-10-28 ENCOUNTER — Ambulatory Visit: Payer: PPO | Admitting: Internal Medicine

## 2020-10-28 ENCOUNTER — Encounter: Payer: Self-pay | Admitting: *Deleted

## 2020-10-28 ENCOUNTER — Encounter: Payer: Self-pay | Admitting: Internal Medicine

## 2020-10-28 VITALS — BP 166/84 | HR 52 | Ht 64.0 in | Wt 186.2 lb

## 2020-10-28 DIAGNOSIS — I495 Sick sinus syndrome: Secondary | ICD-10-CM | POA: Diagnosis not present

## 2020-10-28 DIAGNOSIS — I10 Essential (primary) hypertension: Secondary | ICD-10-CM

## 2020-10-28 MED ORDER — LOSARTAN POTASSIUM 25 MG PO TABS: 25.0000 mg | ORAL_TABLET | Freq: Every day | ORAL | 3 refills | Status: DC

## 2020-10-28 NOTE — H&P (View-Only) (Signed)
PCP: Elby Showers, MD   Primary EP: Dr Richarda Blade Julia Solis is a 70 y.o. female who presents today for routine electrophysiology followup.  Since last being seen in our clinic, the patient reports doing reasonably well.  She continues to have fatigue and decreased exercise tolerance.  She finds that when her heart rate is slow (40s) that she feels bad.  Occasionally, her heart rate is normal (70s) and she feels much better.  + rare dizziness.  Today, she denies symptoms of palpitations, chest pain, shortness of breath,  lower extremity edema, presyncope, or syncope.  The patient is otherwise without complaint today.   Past Medical History:  Diagnosis Date  . Cancer ([place]) 2011   ovarian  . Complication of anesthesia   . History of chemotherapy   . History of kidney stones   . PONV (postoperative nausea and vomiting)    Past Surgical History:  Procedure Laterality Date  . ABDOMINAL HYSTERECTOMY  2011  . BACK SURGERY  1997  . [place]  . COSMETIC SURGERY    . etopic preg    . EXTRACORPOREAL SHOCK WAVE LITHOTRIPSY Left 06/23/2018   Procedure: LEFT EXTRACORPOREAL SHOCK WAVE LITHOTRIPSY (ESWL);  Surgeon: Cleon Gustin, MD;  Location: [facility];  Service: Urology;  Laterality: Left;  . GANGLION CYST EXCISION  1975  . HAND SURGERY     right  . TONSILLECTOMY  1957    ROS- all systems are reviewed and negatives except as per HPI above  Current Outpatient Medications  Medication Sig Dispense Refill  . calcium carbonate (TUMS - DOSED IN MG ELEMENTAL CALCIUM) 500 MG chewable tablet Chew 1 tablet by mouth as needed for indigestion or heartburn.    . cholecalciferol (VITAMIN D) 1000 units tablet Take 2,000 Units by mouth daily.     Marland Kitchen DICLOFENAC SODIUM IV Apply 1 application topically as needed. GEL    . famotidine (PEPCID) 10 MG tablet Take 10 mg by mouth as needed.    . Loratadine 10 MG CAPS Take 10 mg by mouth daily. Reported on 11/13/2015    . LORazepam  (ATIVAN) 1 MG tablet TAKE 1 TABLET BY MOUTH AT BEDTIME AS NEEDED 30 tablet 5  . meloxicam (MOBIC) 15 MG tablet TAKE 1 TABLET BY MOUTH EVERY DAY (Patient taking differently: Take 15 mg by mouth as needed for pain.) 90 tablet 0  . Polyethyl Glycol-Propyl Glycol (SYSTANE OP) Apply 1 drop to eye daily. Reported on 11/13/2015    . polyethylene glycol (MIRALAX / GLYCOLAX) packet Take 17 g by mouth as needed. Reported on 11/13/2015    . rosuvastatin (CRESTOR) 5 MG tablet TAKE 1 TABLET BY MOUTH 3 TIMES A WEEK WITH SUPPER 36 tablet 1   No current facility-administered medications for this visit.    Physical Exam: Vitals:   10/28/20 1532  BP: (!) 166/84  Pulse: (!) 52  SpO2: 97%  Weight: 186 lb 3.2 oz (84.5 kg)  Height: 5\' 4"  (1.626 m)    GEN- The patient is well appearing, alert and oriented x 3 today.   Head- normocephalic, atraumatic Eyes-  Sclera clear, conjunctiva pink Ears- hearing intact Oropharynx- clear Lungs- Clear to ausculation bilaterally, normal work of breathing Heart- Regular rate and rhythm, no murmurs, rubs or gallops, PMI not laterally displaced GI- soft, NT, ND, + BS Extremities- no clubbing, cyanosis, or edema  Wt Readings from Last 3 Encounters:  10/28/20 186 lb 3.2 oz (84.5 kg)  09/23/20 186  lb (84.4 kg)  09/10/20 185 lb (83.9 kg)    EKG tracing ordered today is personally reviewed and shows sinus bradycardia/ accelerated junctional rhythm  Assessment and Plan:  1. Nonsustained atach/ PACs Stable No change required today  2. Bradycardia With ETT, maximum heart rate of 152 bpm (77% of maximum predicted) was reached. She has sick sinus syndrome.  No reversible causes are found. The patient has symptomatic bradycardia.  I would therefore recommend pacemaker implantation at this time.  Risks, benefits, alternatives to pacemaker implantation were discussed in detail with the patient today. The patient understands that the risks include but are not limited to  bleeding, infection, pneumothorax, perforation, tamponade, vascular damage, renal failure, MI, stroke, death,  and lead dislodgement and wishes to proceed. We will therefore schedule the procedure at the next available time.  We also discussed remote monitoring and its role today.  3. HTN Elevated Start losartan 25mg  daily   Risks, benefits and potential toxicities for medications prescribed and/or refilled reviewed with patient today.   Thompson Grayer MD, [facility] 10/28/2020 3:34 PM

## 2020-10-28 NOTE — Progress Notes (Signed)
 PCP: Baxley, Mary J, MD   Primary EP: Dr Esabella Stockinger  Julia Solis is a 70 y.o. female who presents today for routine electrophysiology followup.  Since last being seen in our clinic, the patient reports doing reasonably well.  She continues to have fatigue and decreased exercise tolerance.  She finds that when her heart rate is slow (40s) that she feels bad.  Occasionally, her heart rate is normal (70s) and she feels much better.  + rare dizziness.  Today, she denies symptoms of palpitations, chest pain, shortness of breath,  lower extremity edema, presyncope, or syncope.  The patient is otherwise without complaint today.   Past Medical History:  Diagnosis Date  . Cancer ([facility]) 2011   ovarian  . Complication of anesthesia   . History of chemotherapy   . History of kidney stones   . PONV (postoperative nausea and vomiting)    Past Surgical History:  Procedure Laterality Date  . ABDOMINAL HYSTERECTOMY  2011  . BACK SURGERY  1997  . CESAREAN SECTION  1982, 1985  . COSMETIC SURGERY    . etopic preg    . EXTRACORPOREAL SHOCK WAVE LITHOTRIPSY Left 06/23/2018   Procedure: LEFT EXTRACORPOREAL SHOCK WAVE LITHOTRIPSY (ESWL);  Surgeon: McKenzie, Patrick L, MD;  Location: [facility];  Service: Urology;  Laterality: Left;  . GANGLION CYST EXCISION  1975  . HAND SURGERY     right  . TONSILLECTOMY  1957    ROS- all systems are reviewed and negatives except as per HPI above  Current Outpatient Medications  Medication Sig Dispense Refill  . calcium carbonate (TUMS - DOSED IN MG ELEMENTAL CALCIUM) 500 MG chewable tablet Chew 1 tablet by mouth as needed for indigestion or heartburn.    . cholecalciferol (VITAMIN D) 1000 units tablet Take 2,000 Units by mouth daily.     . DICLOFENAC SODIUM IV Apply 1 application topically as needed. GEL    . famotidine (PEPCID) 10 MG tablet Take 10 mg by mouth as needed.    . Loratadine 10 MG CAPS Take 10 mg by mouth daily. Reported on 11/13/2015    . LORazepam  (ATIVAN) 1 MG tablet TAKE 1 TABLET BY MOUTH AT BEDTIME AS NEEDED 30 tablet 5  . meloxicam (MOBIC) 15 MG tablet TAKE 1 TABLET BY MOUTH EVERY DAY (Patient taking differently: Take 15 mg by mouth as needed for pain.) 90 tablet 0  . Polyethyl Glycol-Propyl Glycol (SYSTANE OP) Apply 1 drop to eye daily. Reported on 11/13/2015    . polyethylene glycol (MIRALAX / GLYCOLAX) packet Take 17 g by mouth as needed. Reported on 11/13/2015    . rosuvastatin (CRESTOR) 5 MG tablet TAKE 1 TABLET BY MOUTH 3 TIMES A WEEK WITH SUPPER 36 tablet 1   No current facility-administered medications for this visit.    Physical Exam: Vitals:   10/28/20 1532  BP: (!) 166/84  Pulse: (!) 52  SpO2: 97%  Weight: 186 lb 3.2 oz (84.5 kg)  Height: 5' 4" (1.626 m)    GEN- The patient is well appearing, alert and oriented x 3 today.   Head- normocephalic, atraumatic Eyes-  Sclera clear, conjunctiva pink Ears- hearing intact Oropharynx- clear Lungs- Clear to ausculation bilaterally, normal work of breathing Heart- Regular rate and rhythm, no murmurs, rubs or gallops, PMI not laterally displaced GI- soft, NT, ND, + BS Extremities- no clubbing, cyanosis, or edema  Wt Readings from Last 3 Encounters:  10/28/20 186 lb 3.2 oz (84.5 kg)  09/23/20 186   lb (84.4 kg)  09/10/20 185 lb (83.9 kg)    EKG tracing ordered today is personally reviewed and shows sinus bradycardia/ accelerated junctional rhythm  Assessment and Plan:  1. Nonsustained atach/ PACs Stable No change required today  2. Bradycardia With ETT, maximum heart rate of 152 bpm (77% of maximum predicted) was reached. She has sick sinus syndrome.  No reversible causes are found. The patient has symptomatic bradycardia.  I would therefore recommend pacemaker implantation at this time.  Risks, benefits, alternatives to pacemaker implantation were discussed in detail with the patient today. The patient understands that the risks include but are not limited to  bleeding, infection, pneumothorax, perforation, tamponade, vascular damage, renal failure, MI, stroke, death,  and lead dislodgement and wishes to proceed. We will therefore schedule the procedure at the next available time.  We also discussed remote monitoring and its role today.  3. HTN Elevated Start losartan 25mg  daily   Risks, benefits and potential toxicities for medications prescribed and/or refilled reviewed with patient today.   Thompson Grayer MD, [facility] 10/28/2020 3:34 PM

## 2020-10-28 NOTE — Patient Instructions (Signed)
Medication Instructions:  Start Losartan 25 mg daily *If you need a refill on your cardiac medications before your next appointment, please call your pharmacy*   Lab Work: CBC, BMP If you have labs (blood work) drawn today and your tests are completely normal, you will receive your results only by: Marland Kitchen MyChart Message (if you have MyChart) OR . A paper copy in the mail If you have any lab test that is abnormal or we need to change your treatment, we will call you to review the results.   Testing/Procedures: none   Follow-Up: At [facility], you and your health needs are our priority.  As part of our continuing mission to provide you with exceptional heart care, we have created designated Provider Care Teams.  These Care Teams include your primary Cardiologist (physician) and Advanced Practice Providers (APPs -  Physician Assistants and Nurse Practitioners) who all work together to provide you with the care you need, when you need it.  We recommend signing up for the patient portal called "MyChart".  Sign up information is provided on this After Visit Summary.  MyChart is used to connect with patients for Virtual Visits (Telemedicine).  Patients are able to view lab/test results, encounter notes, upcoming appointments, etc.  Non-urgent messages can be sent to your provider as well.   To learn more about what you can do with MyChart, go to [email or web address].      Other Instructions Losartan Tablets What is this medicine? LOSARTAN (loe SAR tan) is an angiotensin II receptor blocker, also known as an ARB. It treats high blood pressure. It can slow kidney damage in some patients. It may also be used to lower the risk of stroke. This medicine may be used for other purposes; ask your health care provider or pharmacist if you have questions. COMMON BRAND NAME(S): Cozaar What should I tell my health care provider before I take this medicine? They need to know if you have any of these  conditions:  heart failure  kidney disease  liver disease  an unusual or allergic reaction to losartan, other medicines, foods, dyes, or preservatives  pregnant or trying to get pregnant  breast-feeding How should I use this medicine? Take this medicine by mouth. Take it as directed on the prescription label at the same time every day. You can take it with or without food. If it upsets your stomach, take it with food. Keep taking it unless your health care provider tells you to stop. Talk to your health care provider about the use of this medicine in children. While it may be prescribed for children as young as 6 for selected conditions, precautions do apply. Overdosage: If you think you have taken too much of this medicine contact a poison control center or emergency room at once. NOTE: This medicine is only for you. Do not share this medicine with others. What if I miss a dose? If you miss a dose, take it as soon as you can. If it is almost time for your next dose, take only that dose. Do not take double or extra doses. What may interact with this medicine?  aliskiren  ACE inhibitors, like enalapril or lisinopril  diuretics, especially amiloride, eplerenone, spironolactone, or triamterene  lithium  NSAIDs, medicines for pain and inflammation, like ibuprofen or naproxen  potassium salts or potassium supplements This list may not describe all possible interactions. Give your health care provider a list of all the medicines, herbs, non-prescription drugs, or dietary supplements you use.  Also tell them if you smoke, drink alcohol, or use illegal drugs. Some items may interact with your medicine. What should I watch for while using this medicine? Visit your health care provider for regular check ups. Check your blood pressure as directed. Ask your health care provider what your blood pressure should be. Also, find out when you should contact him or her. Do not treat yourself for  coughs, colds, or pain while you are using this medicine without asking your health care provider for advice. Some medicines may increase your blood pressure. Women should inform their health care provider if they wish to become pregnant or think they might be pregnant. There is a potential for serious side effects to an unborn child. Talk to your health care provider for more information. You may get drowsy or dizzy. Do not drive, use machinery, or do anything that needs mental alertness until you know how this medicine affects you. Do not stand or sit up quickly, especially if you are an older patient. This reduces the risk of dizzy or fainting spells. Alcohol can make you more drowsy and dizzy. Avoid alcoholic drinks. Avoid salt substitutes unless you are told otherwise by your health care provider. What side effects may I notice from receiving this medicine? Side effects that you should report to your doctor or health care professional as soon as possible:  allergic reactions (skin rash, itching or hives, swelling of the hands, feet, face, lips, throat, or tongue)  breathing problems  high potassium levels (chest pain; or fast, irregular heartbeat; muscle weakness)  kidney injury (trouble passing urine or change in the amount of urine)  low blood pressure (dizziness; feeling faint or lightheaded, falls; unusually weak or tired) Side effects that usually do not require medical attention (report to your doctor or health care professional if they continue or are bothersome):  cough  headache  nasal congestion or stuffiness  nausea or stomach pain This list may not describe all possible side effects. Call your doctor for medical advice about side effects. You may report side effects to FDA at 1-800-FDA-1088. Where should I keep my medicine? Keep out of the reach of children and pets. Store at room temperature between 20 and 25 degrees C (68 and 77 degrees F). Protect from light. Keep the  container tightly closed. Get rid of any unused medicine after the expiration date. To get rid of medicines that are no longer needed or have expired:  Take the medicine to a medicine take-back program. Check with your pharmacy or law enforcement to find a location.  If you cannot return the medicine, check the label or package insert to see if the medicine should be thrown out in the garbage or flushed down the toilet. If you are not sure, ask your health care provider. If it is safe to put in the trash, empty the medicine out of the container. Mix the medicine with cat litter, dirt, coffee grounds, or other unwanted substance. Seal the mixture in a bag or container. Put it in the trash. NOTE: This sheet is a summary. It may not cover all possible information. If you have questions about this medicine, talk to your doctor, pharmacist, or health care provider.  2021 Elsevier/Gold Standard (2019-11-30 16:16:09)   Pacemaker Implantation, Adult Pacemaker implantation is a procedure to place a pacemaker inside the chest. A pacemaker is a small computer that sends electrical signals to the heart and helps the heart beat normally. A pacemaker also stores information about  heart rhythms. You may need pacemaker implantation if you have:  A slow heartbeat (bradycardia).  Loss of consciousness that happens repeatedly (syncope) or repeated episodes of dizziness or light-headedness because of an irregular heart rate.  Shortness of breath (dyspnea) due to heart problems. The pacemaker usually attaches to your heart through a wire called a lead. One or two leads may be needed. There are different types of pacemakers:  Transvenous pacemaker. This type is placed under the skin or muscle of your upper chest area. The lead goes through a vein in the chest area to reach the inside of the heart.  Epicardial pacemaker. This type is placed under the skin or muscle of your chest or abdomen. The lead goes through  your chest to the outside of the heart. Tell a health care provider about:  Any allergies you have.  All medicines you are taking, including vitamins, herbs, eye drops, creams, and over-the-counter medicines.  Any problems you or family members have had with anesthetic medicines.  Any blood or bone disorders you have.  Any surgeries you have had.  Any medical conditions you have.  Whether you are pregnant or may be pregnant. What are the risks? Generally, this is a safe procedure. However, problems may occur, including:  Infection.  Bleeding.  Failure of the pacemaker or the lead.  Collapse of a lung or bleeding into a lung.  Blood clot inside a blood vessel with a lead.  Damage to the heart.  Infection inside the heart (endocarditis).  Allergic reactions to medicines. What happens before the procedure? Staying hydrated Follow instructions from your health care provider about hydration, which may include:  Up to 2 hours before the procedure - you may continue to drink clear liquids, such as water, clear fruit juice, black coffee, and plain tea.   Eating and drinking restrictions Follow instructions from your health care provider about eating and drinking, which may include:  8 hours before the procedure - stop eating heavy meals or foods, such as meat, fried foods, or fatty foods.  6 hours before the procedure - stop eating light meals or foods, such as toast or cereal.  6 hours before the procedure - stop drinking milk or drinks that contain milk.  2 hours before the procedure - stop drinking clear liquids. Medicines Ask your health care provider about:  Changing or stopping your regular medicines. This is especially important if you are taking diabetes medicines or blood thinners.  Taking medicines such as aspirin and ibuprofen. These medicines can thin your blood. Do not take these medicines unless your health care provider tells you to take them.  Taking  over-the-counter medicines, vitamins, herbs, and supplements. Tests You may have:  A heart evaluation. This may include: ? An electrocardiogram (ECG). This involves placing patches on your skin to check your heart rhythm. ? A chest X-ray. ? An echocardiogram. This is a test that uses sound waves (ultrasound) to produce an image of the heart. ? A cardiac rhythm monitor. This is used to record your heart rhythm and any events for a longer period of time.  Blood tests.  Genetic testing. General instructions  Do not use any products that contain nicotine or tobacco for at least 4 weeks before the procedure. These products include cigarettes, e-cigarettes, and chewing tobacco. If you need help quitting, ask your health care provider.  Ask your health care provider: ? How your surgery site will be marked. ? What steps will be taken to help  prevent infection. These steps may include:  Removing hair at the surgery site.  Washing skin with a germ-killing soap.  Receiving antibiotic medicine.  Plan to have someone take you home from the hospital or clinic.  If you will be going home right after the procedure, plan to have someone with you for 24 hours. What happens during the procedure?  An IV will be inserted into one of your veins.  You will be given one or more of the following: ? A medicine to help you relax (sedative). ? A medicine to numb the area (local anesthetic). ? A medicine to make you fall asleep (general anesthetic).  The next steps vary depending on the type of pacemaker you will be getting. ? If you are getting a transvenous pacemaker:  An incision will be made in your upper chest.  A pocket will be made for the pacemaker. It may be placed under the skin or between layers of muscle.  The lead will be inserted into a blood vessel that goes to the heart.  While X-rays are taken by an imaging machine (fluoroscopy), the lead will be advanced through the vein to the  inside of your heart.  The other end of the lead will be tunneled under the skin and attached to the pacemaker. ? If you are getting an epicardial pacemaker:  An incision will be made near your ribs or breastbone (sternum) for the lead.  The lead will be attached to the outside of your heart.  Another incision will be made in your chest or upper abdomen to create a pocket for the pacemaker.  The free end of the lead will be tunneled under the skin and attached to the pacemaker.  The transvenous or epicardial pacemaker will be tested. Imaging studies may be done to check the lead position.  The incisions will be closed with stitches (sutures), adhesive strips, or skin glue.  Bandages (dressings) will be placed over the incisions. The procedure may vary among health care providers and hospitals. What happens after the procedure?  Your blood pressure, heart rate, breathing rate, and blood oxygen level will be monitored until you leave the hospital or clinic.  You may be given antibiotics.  You will be given pain medicine.  An ECG and chest X-rays will be done.  You may need to wear a continuous type of ECG (Holter monitor) to check your heart rhythm.  Your health care provider will program the pacemaker.  If you were given a sedative during the procedure, it can affect you for several hours. Do not drive or operate machinery until your health care provider says that it is safe.  You will be given a pacemaker identification card. This card lists the implant date, device model, and manufacturer of your pacemaker. Summary  A pacemaker is a small computer that sends electrical signals to the heart and helps the heart beat normally.  There are different types of pacemakers. A pacemaker may be placed under the skin or muscle of your chest or abdomen.  Follow instructions from your health care provider about eating and drinking and about taking medicines before the procedure. This  information is not intended to replace advice given to you by your health care provider. Make sure you discuss any questions you have with your health care provider. Document Revised: 08/23/2019 Document Reviewed: 08/23/2019 Elsevier Patient Education  2021 [employer].

## 2020-10-29 LAB — BASIC METABOLIC PANEL
BUN/Creatinine Ratio: 21 (ref 12–28)
BUN: 16 mg/dL (ref 8–27)
CO2: 24 mmol/L (ref 20–29)
Calcium: 10 mg/dL (ref 8.7–10.3)
Chloride: 103 mmol/L (ref 96–106)
Creatinine, Ser: 0.76 mg/dL (ref 0.57–1.00)
GFR calc Af Amer: 93 mL/min/{1.73_m2} (ref >59)
GFR calc non Af Amer: 81 mL/min/{1.73_m2} (ref >59)
Glucose: 104 mg/dL — ABNORMAL HIGH (ref 65–99)
Potassium: 4.3 mmol/L (ref 3.5–5.2)
Sodium: 144 mmol/L (ref 134–144)

## 2020-10-29 LAB — CBC WITH DIFFERENTIAL/PLATELET
Basophils Absolute: 0.1 10*3/uL (ref 0.0–0.2)
Basos: 1 %
EOS (ABSOLUTE): 0.3 10*3/uL (ref 0.0–0.4)
Eos: 3 %
Hematocrit: 41.7 % (ref 34.0–46.6)
Hemoglobin: 13.7 g/dL (ref 11.1–15.9)
Immature Grans (Abs): 0 10*3/uL (ref 0.0–0.1)
Immature Granulocytes: 0 %
Lymphocytes Absolute: 3.2 10*3/uL — ABNORMAL HIGH (ref 0.7–3.1)
Lymphs: 36 %
MCH: 28.6 pg (ref 26.6–33.0)
MCHC: 32.9 g/dL (ref 31.5–35.7)
MCV: 87 fL (ref 79–97)
Monocytes Absolute: 0.9 10*3/uL (ref 0.1–0.9)
Monocytes: 10 %
Neutrophils Absolute: 4.7 10*3/uL (ref 1.4–7.0)
Neutrophils: 50 %
Platelets: 251 10*3/uL (ref 150–450)
RBC: 4.79 x10E6/uL (ref 3.77–5.28)
RDW: 13.1 % (ref 11.7–15.4)
WBC: 9.1 10*3/uL (ref 3.4–10.8)

## 2020-10-31 DIAGNOSIS — N202 Calculus of kidney with calculus of ureter: Secondary | ICD-10-CM | POA: Diagnosis not present

## 2020-11-01 ENCOUNTER — Telehealth: Payer: Self-pay | Admitting: Internal Medicine

## 2020-11-01 NOTE — Telephone Encounter (Signed)
New message    Pt would like a call back, she would like to discuss how long it takes and more about the restrictions afterwards. She says she may have some other questions. Please call.

## 2020-11-03 ENCOUNTER — Other Ambulatory Visit: Payer: Self-pay | Admitting: Internal Medicine

## 2020-11-04 ENCOUNTER — Encounter: Payer: Self-pay | Admitting: *Deleted

## 2020-11-04 ENCOUNTER — Telehealth: Payer: Self-pay

## 2020-11-04 ENCOUNTER — Ambulatory Visit: Payer: PPO | Admitting: Internal Medicine

## 2020-11-04 DIAGNOSIS — Z1231 Encounter for screening mammogram for malignant neoplasm of breast: Secondary | ICD-10-CM | POA: Diagnosis not present

## 2020-11-04 LAB — HM MAMMOGRAPHY

## 2020-11-04 NOTE — Telephone Encounter (Signed)
The patient wanted to know when can she shower after she get implanted.

## 2020-11-04 NOTE — Telephone Encounter (Signed)
Questions answered concerning after care and lifting restrictions following pacemaker implant scheduled for 11/13/20

## 2020-11-04 NOTE — Telephone Encounter (Signed)
Called to speak with patient. Questions answered.  Legrand Como [address] [place], PA-C  11/04/2020 1:59 PM

## 2020-11-04 NOTE — Telephone Encounter (Signed)
Answered questions that I was able to advise on. Give patient device clinic's number for the other questions to be addressed.

## 2020-11-05 ENCOUNTER — Other Ambulatory Visit (HOSPITAL_COMMUNITY): Payer: PPO

## 2020-11-06 ENCOUNTER — Encounter: Payer: Self-pay | Admitting: Internal Medicine

## 2020-11-11 ENCOUNTER — Other Ambulatory Visit (HOSPITAL_COMMUNITY)
Admission: RE | Admit: 2020-11-11 | Discharge: 2020-11-11 | Disposition: A | Discharge status: Home / Self Care | Payer: PPO | Source: Ambulatory Visit | Attending: Internal Medicine | Admitting: Internal Medicine

## 2020-11-11 DIAGNOSIS — Z20822 Contact with and (suspected) exposure to covid-19: Secondary | ICD-10-CM | POA: Diagnosis not present

## 2020-11-11 DIAGNOSIS — Z01812 Encounter for preprocedural laboratory examination: Secondary | ICD-10-CM | POA: Diagnosis not present

## 2020-11-12 LAB — SARS CORONAVIRUS 2 (TAT 6-24 HRS): SARS Coronavirus 2: NEGATIVE

## 2020-11-12 NOTE — Progress Notes (Signed)
Instructed patient on the following items: Arrival time 1030  Nothing to eat or drink after midnight No meds AM of procedure Responsible person to drive you home and stay with you for 24 hrs Wash with special soap night before and morning of procedure 

## 2020-11-13 ENCOUNTER — Other Ambulatory Visit: Payer: Self-pay

## 2020-11-13 ENCOUNTER — Ambulatory Visit (HOSPITAL_COMMUNITY)
Admission: RE | Admit: 2020-11-13 | Discharge: 2020-11-13 | Disposition: A | Discharge status: Home / Self Care | Payer: PPO | Attending: Internal Medicine | Admitting: Internal Medicine

## 2020-11-13 ENCOUNTER — Ambulatory Visit (HOSPITAL_COMMUNITY)
Admission: RE | Disposition: A | Discharge status: Home / Self Care | Payer: PPO | Source: Home / Self Care | Attending: Internal Medicine

## 2020-11-13 ENCOUNTER — Ambulatory Visit (HOSPITAL_COMMUNITY): Payer: PPO

## 2020-11-13 DIAGNOSIS — I10 Essential (primary) hypertension: Secondary | ICD-10-CM | POA: Insufficient documentation

## 2020-11-13 DIAGNOSIS — I495 Sick sinus syndrome: Secondary | ICD-10-CM | POA: Insufficient documentation

## 2020-11-13 DIAGNOSIS — Z950 Presence of cardiac pacemaker: Secondary | ICD-10-CM | POA: Diagnosis not present

## 2020-11-13 DIAGNOSIS — J984 Other disorders of lung: Secondary | ICD-10-CM | POA: Diagnosis not present

## 2020-11-13 DIAGNOSIS — Z79899 Other long term (current) drug therapy: Secondary | ICD-10-CM | POA: Diagnosis not present

## 2020-11-13 HISTORY — PX: PACEMAKER IMPLANT: EP1218

## 2020-11-13 SURGERY — PACEMAKER IMPLANT

## 2020-11-13 MED ORDER — SODIUM CHLORIDE 0.9 % IV SOLN: INTRAVENOUS | Status: DC

## 2020-11-13 MED ORDER — MIDAZOLAM HCL 5 MG/5ML IJ SOLN
INTRAMUSCULAR | Status: AC
  Filled 2020-11-13: qty 5

## 2020-11-13 MED ORDER — SODIUM CHLORIDE 0.9 % IV SOLN
80.0000 mg | INTRAVENOUS | Status: AC
  Administered 2020-11-13: 80 mg

## 2020-11-13 MED ORDER — HEPARIN (PORCINE) IN NACL 1000-0.9 UT/500ML-% IV SOLN
INTRAVENOUS | Status: DC | PRN
  Administered 2020-11-13: 500 mL

## 2020-11-13 MED ORDER — ACETAMINOPHEN 325 MG PO TABS
325.0000 mg | ORAL_TABLET | ORAL | Status: DC | PRN
  Administered 2020-11-13: 650 mg via ORAL

## 2020-11-13 MED ORDER — SODIUM CHLORIDE 0.9 % IV SOLN: 250.0000 mL | INTRAVENOUS | Status: DC | PRN

## 2020-11-13 MED ORDER — CEFAZOLIN SODIUM-DEXTROSE 2-4 GM/100ML-% IV SOLN
2.0000 g | INTRAVENOUS | Status: AC
  Administered 2020-11-13: 2 g via INTRAVENOUS

## 2020-11-13 MED ORDER — HEPARIN (PORCINE) IN NACL 1000-0.9 UT/500ML-% IV SOLN
INTRAVENOUS | Status: AC
  Filled 2020-11-13: qty 500

## 2020-11-13 MED ORDER — FENTANYL CITRATE (PF) 100 MCG/2ML IJ SOLN
INTRAMUSCULAR | Status: DC | PRN
  Administered 2020-11-13: 25 ug via INTRAVENOUS

## 2020-11-13 MED ORDER — SODIUM CHLORIDE 0.9% FLUSH: 3.0000 mL | Freq: Two times a day (BID) | INTRAVENOUS | Status: DC

## 2020-11-13 MED ORDER — CEFAZOLIN SODIUM-DEXTROSE 2-4 GM/100ML-% IV SOLN
INTRAVENOUS | Status: AC
  Filled 2020-11-13: qty 100

## 2020-11-13 MED ORDER — ACETAMINOPHEN 325 MG PO TABS
ORAL_TABLET | ORAL | Status: AC
  Filled 2020-11-13: qty 2

## 2020-11-13 MED ORDER — ONDANSETRON HCL 4 MG/2ML IJ SOLN: 4.0000 mg | Freq: Four times a day (QID) | INTRAMUSCULAR | Status: DC | PRN

## 2020-11-13 MED ORDER — LIDOCAINE HCL (PF) 1 % IJ SOLN
INTRAMUSCULAR | Status: DC | PRN
  Administered 2020-11-13: 70 mL

## 2020-11-13 MED ORDER — SODIUM CHLORIDE 0.9% FLUSH
3.0000 mL | INTRAVENOUS | Status: DC | PRN
Start: 2020-11-13 — End: 2020-11-13

## 2020-11-13 MED ORDER — MIDAZOLAM HCL 5 MG/5ML IJ SOLN
INTRAMUSCULAR | Status: DC | PRN
  Administered 2020-11-13 (×2): 1 mg via INTRAVENOUS

## 2020-11-13 MED ORDER — LIDOCAINE HCL 1 % IJ SOLN
INTRAMUSCULAR | Status: AC
  Filled 2020-11-13: qty 20

## 2020-11-13 MED ORDER — FENTANYL CITRATE (PF) 100 MCG/2ML IJ SOLN
INTRAMUSCULAR | Status: AC
  Filled 2020-11-13: qty 2

## 2020-11-13 MED ORDER — SODIUM CHLORIDE 0.9 % IV SOLN
INTRAVENOUS | Status: AC
  Filled 2020-11-13: qty 2

## 2020-11-13 MED ORDER — CHLORHEXIDINE GLUCONATE 4 % EX LIQD: 4.0000 "application " | Freq: Once | CUTANEOUS | Status: DC

## 2020-11-13 MED ORDER — IOHEXOL 350 MG/ML SOLN
INTRAVENOUS | Status: DC | PRN
  Administered 2020-11-13: 10 mL via INTRAVENOUS

## 2020-11-13 MED ORDER — LIDOCAINE HCL 1 % IJ SOLN
INTRAMUSCULAR | Status: AC
  Filled 2020-11-13: qty 60

## 2020-11-13 SURGICAL SUPPLY — 8 items
CABLE SURGICAL S-101-97-12 (CABLE) ×2 IMPLANT
LEAD TENDRIL MRI 46CM LPA1200M (Lead) ×1 IMPLANT
LEAD TENDRIL MRI 58CM LPA1200M (Lead) ×1 IMPLANT
MAT PREVALON FULL STRYKER (MISCELLANEOUS) ×1 IMPLANT
PACEMAKER ASSURITY DR-RF (Pacemaker) ×1 IMPLANT
PAD PRO RADIOLUCENT 2001M-C (PAD) ×2 IMPLANT
SHEATH 8FR PRELUDE SNAP 13 (SHEATH) ×2 IMPLANT
TRAY PACEMAKER INSERTION (PACKS) ×2 IMPLANT

## 2020-11-13 NOTE — Interval H&P Note (Signed)
History and Physical Interval Note:  11/13/2020 11:55 AM  Julia Solis  has presented today for surgery, with the diagnosis of bradicardia.  The various methods of treatment have been discussed with the patient and family. After consideration of risks, benefits and other options for treatment, the patient has consented to  Procedure(s): PACEMAKER IMPLANT (N/A) as a surgical intervention.  The patient's history has been reviewed, patient examined, no change in status, stable for surgery.  I have reviewed the patient's chart and labs.  Questions were answered to the patient's satisfaction.    Risks, benefits, alternatives to pacemaker implantation were again discussed in detail with the patient today. The patient understands that the risks include but are not limited to bleeding, infection, pneumothorax, perforation, tamponade, vascular damage, renal failure, MI, stroke, death,  and lead dislodgement and wishes to proceed.    Thompson Grayer MD, [place] 11/13/2020 11:55 AM

## 2020-11-13 NOTE — Discharge Instructions (Signed)
After Your Pacemaker    You have a [employer]. Jude Pacemaker  ACTIVITY  Do not lift your arm above shoulder height for 1 week after your procedure. After 7 days, you may progress as below.   You should remove your sling 24 hours after your procedure, unless otherwise instructed by your provider.     Wednesday November 20, 2020  Thursday November 21, 2020 Friday November 22, 2020 Saturday November 23, 2020    Do not lift, push, pull, or carry anything over 10 pounds with the affected arm until 6 weeks (Wednesday December 25, 2020 ) after your procedure.    Do NOT DRIVE until you have been seen for your wound check, or as long as instructed by your healthcare provider.    Ask your healthcare provider when you can go back to work   INCISION/Dressing   If large square, outer bandage is left in place, this can be removed after 24 hours from your procedure. Do not remove steri-strips or glue as below.    Monitor your Pacemaker site for redness, swelling, and drainage. Call the device clinic at [phone] if you experience these symptoms or fever/chills.   If your incision is sealed with Steri-strips or staples, you may shower 10 days after your procedure or when told by your provider. Do not remove the steri-strips or let the shower hit directly on your site. You may wash around your site with soap and water.     Avoid lotions, ointments, or perfumes over your incision until it is well-healed.   You may use a hot tub or a pool AFTER your wound check appointment if the incision is completely closed.   PAcemaker Alerts:  Some alerts are vibratory and others beep. These are NOT emergencies. Please call our office to let us know. If this occurs at night or on weekends, it can wait until the next business day. Send a remote transmission.   If your device is capable of reading fluid status (for heart failure), you will be offered monthly monitoring to review this with you.   DEVICE  MANAGEMENT  Remote monitoring is used to monitor your pacemaker from home. This monitoring is scheduled every 91 days by our office. It allows [place] to keep an eye on the functioning of your device to ensure it is working properly. You will routinely see your Electrophysiologist annually (more often if necessary).    You should receive your ID card for your new device in 4-8 weeks. Keep this card with you at all times once received. Consider wearing a medical alert bracelet or necklace.   Your Pacemaker may be MRI compatible. This will be discussed at your next office visit/wound check.  You should avoid contact with strong electric or magnetic fields.    Do not use amateur (ham) radio equipment or electric (arc) welding torches. MP3 player headphones with magnets should not be used. Some devices are safe to use if held at least 12 inches (30 cm) from your Pacemaker. These include power tools, lawn mowers, and speakers. If you are unsure if something is safe to use, ask your health care provider.   When using your cell phone, hold it to the ear that is on the opposite side from the Pacemaker. Do not leave your cell phone in a pocket over the Pacemaker.   You may safely use electric blankets, heating pads, computers, and microwave ovens.  Call the office right away if:  You have chest pain.  You  feel more short of breath than you have felt before.  You feel more light-headed than you have felt before.  Your incision starts to open up.  This information is not intended to replace advice given to you by your health care provider. Make sure you discuss any questions you have with your health care provider.

## 2020-11-13 NOTE — Progress Notes (Addendum)
Discharge instructions reviewed with pt and her daughter Benjie Karvonen (via telephone) both voice understanding. Dr Rayann Heman called states ok for pt to be d/c.

## 2020-11-14 ENCOUNTER — Encounter (HOSPITAL_COMMUNITY): Payer: Self-pay | Admitting: Internal Medicine

## 2020-11-14 MED FILL — Lidocaine HCl Local Inj 1%: INTRAMUSCULAR | Qty: 70 | Status: AC

## 2020-11-26 ENCOUNTER — Other Ambulatory Visit: Payer: Self-pay

## 2020-11-26 ENCOUNTER — Ambulatory Visit (INDEPENDENT_AMBULATORY_CARE_PROVIDER_SITE_OTHER): Payer: PPO | Admitting: Emergency Medicine

## 2020-11-26 ENCOUNTER — Telehealth: Payer: Self-pay

## 2020-11-26 DIAGNOSIS — I495 Sick sinus syndrome: Secondary | ICD-10-CM

## 2020-11-26 LAB — CUP PACEART INCLINIC DEVICE CHECK
Battery Remaining Longevity: 135 mo
Battery Voltage: 3.08 V
Brady Statistic RA Percent Paced: 9.1 %
Brady Statistic RV Percent Paced: 0.03 %
Date Time Interrogation Session: 20220222151700
Implantable Lead Implant Date: 20220201
Implantable Lead Implant Date: 20220201
Implantable Lead Location: 753859
Implantable Lead Location: 753860
Implantable Pulse Generator Implant Date: 20220201
Lead Channel Impedance Value: 412.5 Ohm
Lead Channel Impedance Value: 575 Ohm
Lead Channel Pacing Threshold Amplitude: 0.75 V
Lead Channel Pacing Threshold Amplitude: 0.75 V
Lead Channel Pacing Threshold Pulse Width: 0.5 ms
Lead Channel Pacing Threshold Pulse Width: 0.5 ms
Lead Channel Sensing Intrinsic Amplitude: 4.6 mV
Lead Channel Sensing Intrinsic Amplitude: 9.7 mV
Lead Channel Setting Pacing Amplitude: 3.5 V
Lead Channel Setting Pacing Amplitude: 3.5 V
Lead Channel Setting Pacing Pulse Width: 0.5 ms
Lead Channel Setting Sensing Sensitivity: 2 mV
Pulse Gen Model: 2272
Pulse Gen Serial Number: 3896658

## 2020-11-26 NOTE — Telephone Encounter (Signed)
The patient asked me how soon can she have an x-ray? I let the patient know that she is okay to have an x-ray any time.

## 2020-11-26 NOTE — Progress Notes (Signed)
Wound check appointment. Steri-strips removed. Wound without redness or edema. Incision edges approximated, wound well healed. Normal device function. Thresholds, sensing, and impedances consistent with implant measurements. Device programmed at 3.5V/auto capture programmed on for extra safety margin until 3 month visit. Histogram distribution appropriate for patient and level of activity. No mode switches or high ventricular rates noted. Patient educated about wound care, arm mobility, lifting restrictions. Enrolled in remote follow-up and next remote 02/12/21. Follow-up 02/13/21.

## 2020-11-29 DIAGNOSIS — N202 Calculus of kidney with calculus of ureter: Secondary | ICD-10-CM | POA: Diagnosis not present

## 2020-12-23 DIAGNOSIS — H52203 Unspecified astigmatism, bilateral: Secondary | ICD-10-CM | POA: Diagnosis not present

## 2020-12-23 DIAGNOSIS — H2513 Age-related nuclear cataract, bilateral: Secondary | ICD-10-CM | POA: Diagnosis not present

## 2020-12-24 ENCOUNTER — Telehealth: Payer: Self-pay

## 2020-12-24 NOTE — Telephone Encounter (Signed)
Patient reports of increased fatigue and a little heavy breathing per patient over the past 10 days. Denies shortness of breath or chest pain. States after her PPM implant she felt improvement but not as much as she thought. States the past weeks he has had more worse days than good days. Reports how she has felt over the past week is similar to how she felt prior to her device put in. Patient reports compliance with all medications on file, none prescribed by Dr. Rayann Heman. Patient reports she checks her blood pressure prior to taking Losartan, typically her heart rate is in the low 60's. And this am BP was 108/73. Presenting rhythm, AS/VS 93. Advised patient I do not see any concerns on her remote but I will forward for Dr. Rayann Heman to review. Patient agreeable with plan.

## 2020-12-24 NOTE — Telephone Encounter (Signed)
The pt states she was doing fine but been feeling bad lately. I had the patient send a manual transmission for the nurse to review.  I told the patient the nurse will review the transmission and give her a call back. Her phone number is [phone].

## 2020-12-27 DIAGNOSIS — N202 Calculus of kidney with calculus of ureter: Secondary | ICD-10-CM | POA: Diagnosis not present

## 2020-12-27 DIAGNOSIS — N132 Hydronephrosis with renal and ureteral calculous obstruction: Secondary | ICD-10-CM | POA: Diagnosis not present

## 2021-01-08 DIAGNOSIS — Z90710 Acquired absence of both cervix and uterus: Secondary | ICD-10-CM | POA: Diagnosis not present

## 2021-01-08 DIAGNOSIS — N201 Calculus of ureter: Secondary | ICD-10-CM | POA: Diagnosis not present

## 2021-01-08 DIAGNOSIS — I700 Atherosclerosis of aorta: Secondary | ICD-10-CM | POA: Diagnosis not present

## 2021-01-08 DIAGNOSIS — D7389 Other diseases of spleen: Secondary | ICD-10-CM | POA: Diagnosis not present

## 2021-01-08 DIAGNOSIS — N132 Hydronephrosis with renal and ureteral calculous obstruction: Secondary | ICD-10-CM | POA: Diagnosis not present

## 2021-01-13 DIAGNOSIS — N202 Calculus of kidney with calculus of ureter: Secondary | ICD-10-CM | POA: Diagnosis not present

## 2021-01-14 ENCOUNTER — Other Ambulatory Visit: Payer: Self-pay | Admitting: Urology

## 2021-01-29 ENCOUNTER — Encounter (HOSPITAL_BASED_OUTPATIENT_CLINIC_OR_DEPARTMENT_OTHER): Payer: Self-pay | Admitting: Urology

## 2021-01-29 ENCOUNTER — Other Ambulatory Visit: Payer: Self-pay

## 2021-01-29 NOTE — Progress Notes (Addendum)
Left message with Julia Solis patient needs cardiac clearance for 02-04-2021 surgery per dr Jenny Reichmann germeroth mda.  Addendum: spoke with dr Legrand Como foster mda pt does not need cardiac clearance for 02-04-2021 surgery per dr Legrand Como foster mda.  Spoke w/ via phone for pre-op interview---pt Lab needs dos----I stat               Lab results------see below COVID test ------02-03-2021 1000 am (patient had  covid exposure Saturday 01-25-2021 and sunday 01-26-2021, pt took rapid covid test 01-29-2021 and result was negative and dr Jenny Reichmann germeroth aware patient will take pcr covid test 02-03-2021 for 02-04-2021 surgery) Arrive at -------815 am 02-04-2021 NPO after MN NO Solid Food.  Clear liquids from MN until---715 am then npo Med rec completed Medications to take morning of surgery ----- Diabetic medication -----famotidine, loratadine, silodosin Patient instructed to bring photo id and insurance card day of surgery Patient aware to have Driver (ride ) / caregiver   Sister Julia Solis will drop pt off  for 24 hours after surgery  Patient Special Instructions -----none Pre-Op special Istructions -----none Patient verbalized understanding of instructions that were given at this phone interview. Patient denies shortness of breath, chest pain, fever, cough at this phone interview.  Anesthesia : s/p  pacemaker implant 11-13-2020 for sick sinus syndrome, sarciodiosis in remission, htn, ovarian cancer 2011 with hysterectomy and chemo done  PCP dr : Stanton Kidney baxley Cardiologist :dr allred  Chest x-ray : 11-13-2020 epic EKG : 11-13-2020 epic : Stress test:none Cardiac Cath : none Activity level:  Does own housework and chores and can climb staits without problems Pulmonary lov dr bellinger 08-21-2020 [facility] Pulmonary function test 09-02-2020 [facility] Sleep Study/ CPAP :n/a ASA / Instructions/ Last Dose : n/a Blood thinner: n/a  Device orders received from dr allred and placed on patient chart for 02-04-2021  surgery

## 2021-01-30 ENCOUNTER — Telehealth: Payer: Self-pay | Admitting: Internal Medicine

## 2021-01-30 ENCOUNTER — Encounter: Payer: Self-pay | Admitting: Internal Medicine

## 2021-01-30 NOTE — Telephone Encounter (Signed)
Primary Cardiologist:James Allred, MD  Chart reviewed as part of pre-operative protocol coverage. Because of Julia Solis's past medical history and time since last visit, he/she will require a follow-up visit in order to better assess preoperative cardiovascular risk.  Pre-op covering staff: - Please schedule appointment and call patient to inform them. - Please contact requesting surgeon's office via preferred method (i.e, phone, fax) to inform them of need for appointment prior to surgery.  If applicable, this message will also be routed to pharmacy pool and/or primary cardiologist for input on holding anticoagulant/antiplatelet agent as requested below so that this information is available at time of patient's appointment.   Julia Pelton, NP  01/30/2021, 9:56 AM

## 2021-01-30 NOTE — Telephone Encounter (Signed)
   [facility] Pre-operative Risk Assessment    Request for surgical clearance:  1. What type of surgery is being performed? ureteroscopy  2. When is this surgery scheduled? 02/04/2021  3. What type of clearance is required (medical clearance vs. Pharmacy clearance to hold med vs. Both)? medical  4. Are there any medications that need to be held prior to surgery and how long? no  5. Practice name and name of physician performing surgery? DR. Georgery Solis  6. What is your office phone number [phone]   7.   What is your office fax number [phone]  8.   Anesthesia type (None, local, MAC, general) ? general   Julia Solis 01/30/2021, 9:31 AM  _________________________________________________________________   (provider comments below)

## 2021-01-30 NOTE — Progress Notes (Signed)
Left message with Julia Solis pt may have surgery 02-03-2021 without cardiac clearance per dr Legrand Como foster mda, spoke with patient and made aware cardiac clearance not needed for 02-04-2021 surgery

## 2021-01-30 NOTE — Progress Notes (Signed)
PERIOPERATIVE PRESCRIPTION FOR IMPLANTED CARDIAC DEVICE PROGRAMMING  Patient Information: Name:  Julia Solis  DOB:  19-Dec-1950  MRN:  [id number]    Planned Procedure: CYSTOSCOPY, LEFT RETROGRADE PYELOGRAM, LEFT URETEROSCOPY WITH LASER Forman PLACEMENT  Surgeon: DR Harold Barban  Date of Procedure: 02-04-2021  Cautery will be used.  Position during surgery:    Please send documentation back to:  [facility] (Fax # [phone])   Device Information:  Clinic EP Physician:  Thompson Grayer, MD   Device Type:  Pacemaker Manufacturer and Phone #:  St. Jude/[employer]ott: [phone] Pacemaker Dependent?:  No. Date of Last Device Check:  12/25/20 Normal Device Function?:  Yes.    Electrophysiologist's Recommendations:   Have magnet available.  Provide continuous ECG monitoring when magnet is used or reprogramming is to be performed.   Procedure may interfere with device function.  Magnet should be placed over device during procedure.  Per Device Clinic Standing Orders, Simone Curia, RN  12:52 PM 01/30/2021

## 2021-01-30 NOTE — Telephone Encounter (Signed)
Will have Dr. Rayann Heman scheduler, [place], reach out to the pt with an ppt.

## 2021-01-30 NOTE — Telephone Encounter (Signed)
Ivin Booty with [facility] called back and has informed me per Dr. Royce Macadamia pt will note need cardiac clearance for upcoming procedure 02/04/21 with [facility] and to keep her f/u 02/14/21 with Dr. Rayann Heman. Ivin Booty also stated she will need the most recent device readings as well as the device clearance form to be faxed over to her. Ivin Booty, states she faxed a device clearance form over yesterday to the device clinic. I assured her that I will send a message to the device clinic. I thanked Ivin Booty for all of her help. I am going to fax notes to [facility], Dr. Casilda Carls.

## 2021-01-30 NOTE — Telephone Encounter (Signed)
I s/w Selita at [facility] and explained the pt is going to need to be seen by cardiology be she can be cleared. Pt had recent pacemaker implant 11/13/20. Phone note 12/24/20 pt s/w the device clinic and stated she was having increased fatigue and heavy breathing x 10 days at that time, denied cp, sob. I explained per pre op provider pt is going to need to be seen in person for assessment based on symptoms she has expressed. Explained her device can be checked at that time as well to make sure all settings are where they need to be . Foster Simpson is in agreement. Foster Simpson will reach out the pt and let her know her procedure will need to be postponed until she see's cardiology.   Selita, called me back and informed me that while she was speaking with the pt, Ivin Booty at [place] called her. Per Dr. Royce Macadamia (Anesthesiologist) reviewed pt's chart and does not need cardiac clearance for her procedure, just needs a f/u sometime soon w/cardiology. Selita, states she did receive a vm from [place], [place] pre op confirming what the pt just told her. Selita, gave me Sharon's phone # so that I may get confirmation as well cardiac clearance not needed.   I left a message for Ivin Booty to please call me at [phone] so that I may confirm message no need for cardiac clearance per Dr. Royce Macadamia.   Pt has appt 02/14/21 with EP.

## 2021-01-31 ENCOUNTER — Other Ambulatory Visit (HOSPITAL_COMMUNITY): Payer: PPO

## 2021-02-03 ENCOUNTER — Other Ambulatory Visit (HOSPITAL_COMMUNITY)
Admission: RE | Admit: 2021-02-03 | Discharge: 2021-02-03 | Disposition: A | Discharge status: Home / Self Care | Payer: PPO | Source: Ambulatory Visit | Attending: Urology | Admitting: Urology

## 2021-02-03 DIAGNOSIS — Z01812 Encounter for preprocedural laboratory examination: Secondary | ICD-10-CM | POA: Diagnosis not present

## 2021-02-03 DIAGNOSIS — Z20822 Contact with and (suspected) exposure to covid-19: Secondary | ICD-10-CM | POA: Insufficient documentation

## 2021-02-03 NOTE — H&P (Signed)
CC/HPI: have a ureteral stone.  HPI: Julia Solis is a 70 year-old female established patient who is here for a ureteral calculus.   Interval 09/12/20: Patient last seen in clinic in 2019. She initially presented with microscopic hematuria and underwent hematuria evaluation. This revealed left ureteral calculus that was treated with ESWL. She recently underwent CT of the chest/abdomen/pelvis for f/u of hx of sarcoid. This revealed left sided hydronephrosis due to an approximatly 8 mm calculus at the UPJ with an additional 3 mm calculus within the upper left ureter. She had additional non obstructive left renal cacluli, as well. She denies interval or current complaints of flank or abdominal pain. She did have 1 episode of gross hematuria several weeks ago, but denies recurrence. She feels she is voiding at baseline. No complaints of fever, chills, nausea, or vomiting. She denies interval passage of stone material since prior office visit. She did have a CT in January, which revealed no ureteral stone burden per her report. She has been having some abnormalities of her heart rhythm recently and has further evaluation with cardiology on 12/20. She states that her PCP recommended against any intervention for her stone until following cardiology evaluation.    The patient's stone was on her left side. This is not her first kidney stone. She is not currently having flank pain, back pain, groin pain, nausea, vomiting, fever or chills. She does not have a burning sensation when she urinates.   She has had ESWL for treatment of her stones in the past.   -10/03/20-patient with history of recent CT scan showing a 3 mm left upper ureteral calculus and an 8 mm left UPJ stone. Was treated conservatively in hopes that the distal smaller stone would pass. She has had no significant pain. The stones were found incidentally during CT scan for follow-up of her sarcoidosis.. In the interim, the patient has had no further  issues other than a single episode of gross hematuria. She has had no pain nausea vomiting or fever.  KUB is reviewed today shows a 3 mm calcification near the left ureterovesical junction area in the calcifications seen previously in the area of the left upper ureter no longer visible suggesting progression of the stone distally. There is a 8 mm calcification near the ureteropelvic junction/renal pelvis area which is unchanged in location and size.  The patient is scheduled to see Cardiology within the next week for evaluation of some shortness of breath symptoms and has a stress treadmill scheduled.  -10/31/20-patient with history of 2 left sided stones 1 distally measuring 3 mm in size and the other 8 mm at the left UPJ area. Was placed on medical expulsive therapy in the interim with silodosin 4 mg daily. In the interim she has noted no significant pain. She has not passed a definitive stone in the interim. The patient has had cardiac evaluation is scheduled undergo pacemaker placement on 11/13/2020.  KUB is reviewed today shows no obvious bony abnormality. There is a persistent calcification in the region of the left renal pelvis/UPJ area measuring about 7 8 mm in size. There is also a persistent calcification in the region of the left distal ureter measuring about 3-4 mm in size consistent with the probable migrated left distal ureteral stone. Similar location to prior KUB on 10/03/2020.  -11/29/20-patient with history of 2 left ureteral calculi as above. Has been on medical expulsive therapy with silodosin 8 mg daily. In the interim.  KUB is reviewed today and shows:  Persistence of the larger 8 mm UPJ stone in similar location. There is also a distal calcification measuring 2-3 mm in size and has moved slightly distally since last KUB on 10/31/2020. The patient has also had her pacemaker placed and was recommended she not have elective surgical procedure until March 20th or after. She is currently  asymptomatic had no nausea vomiting fever or flank pain.  -12/27/20-patient with history of 2 left ureteral calculi as above. Has been on silodosin as medical expulsive therapy. Here for follow-up. In the interim the patient has had no further left-sided flank or back pain, asymptomatic.  KUB is reviewed today and shows: Persistent of large calculus at the region of L3 on the left measuring about 8-9 mm in size unchanged in location. There is also a questionable small calcification in the region of the distal left ureter but is more difficult to see than on prior KUB in February. No obvious bony abnormalities.  Micro urinalysis today shows 10-20 WBCs few bacteria  -01/13/21-patient with history of 2 left ureteral calculi and has had failure to progress stone on subsequent follow-up KUB is. Distal stone not well visualized at last clinic visit on 12/27/2020 so repeated CT urogram. This was reviewed today and shows what appears to be proximal ureteral calculus with some moderate hydroureteronephrosis measuring about 6 mm in size. Although it was not called on the report the left ureter can be followed distally and what appears to be a 4-5 mm calcification at the ureterovesical junction on the left with mild dilatation of the ureter down to this point.  CLINICAL DATA: Evaluation of left ureteral calculus.   EXAM:  CT ABDOMEN AND PELVIS WITHOUT CONTRAST   TECHNIQUE:  Multidetector CT imaging of the abdomen and pelvis was performed  following the standard protocol without IV contrast.   COMPARISON: Abdominal radiograph December 27, 2020, outside CT  August 28, 2020 and CT abdomen pelvis June 03, 2018   FINDINGS:  Lower chest: No acute abnormality. Partially visualized intracardiac  leads.   Hepatobiliary: Unremarkable noncontrast appearance of the hepatic  parenchyma. Gallbladder is grossly unremarkable. No biliary ductal  dilatation.   Pancreas: Within normal limits.   Spleen: Increased size of  the multiple small hypodense splenic  lesions for instance a 3 cm lesion along the inferior aspect of the  spleen on image 23/2 previously measuring 1.9 cm and a 2.9 cm lesion  along the posterior margin of the spleen on image 17/2 previously  measuring 1.8 cm on CT dated 2019 but are unchanged in size in  comparison to most recent CT August 28, 2020.   Adrenals/Urinary Tract: Bilateral adrenal glands are unremarkable.  No right-sided nephrolithiasis or hydronephrosis.   There is slightly increased moderate left-sided hydronephrosis to  the level of a a few stacked stones in the proximal ureter the  largest of which measures 5 mm on image 40/2 with adjacent  periureteric stranding in this area. Additional punctate  nonobstructive left renal stones.   Urinary bladder is grossly unremarkable.   Stomach/Bowel: Small hiatal hernia otherwise the stomach is grossly  unremarkable. No suspicious small bowel wall thickening or mass like  lesions. Appendix appears surgically absent. No suspicious colonic  wall thickening or mass like lesions para   Vascular/Lymphatic: Aortic atherosclerosis. No enlarged abdominal or  pelvic lymph nodes.   Reproductive: Status post hysterectomy. No adnexal masses.   Other: No abdominopelvic ascites.   Musculoskeletal: Multilevel degenerative changes spine. No acute  osseous abnormality.  IMPRESSION:  1. Slightly increased moderate left-sided hydronephrosis to the  level of a few stacked stones in the proximal left ureter the  largest of which measures 5 mm.  2. Additional punctate nonobstructive left renal stone.  3. Increased size of the multiple small hypodense splenic lesions,  increased in size in comparison to CT dated 2019 but are unchanged  in size in comparison to most recent CT dated August 28, 2020,  these demonstrate indeterminate imaging features minor statistically  most likely to be benign. Further evaluation with abdominal MRI with   and without contrast could be utilized for better characterization.    Electronically Signed  By: Dahlia Bailiff MD  On: 01/09/2021 15:26      ALLERGIES: Ciprofloxacin - Skin Rash Latex    MEDICATIONS: Ativan  Claritin  Losartan Potassium  Meloxicam 15 mg tablet  Pepcid  Rosuvastatin Calcium 5 mg tablet  Silodosin 8 mg capsule 1 capsule PO Daily  Systane  Vitamin D2     GU PSH: Cystoscopy - 2019 ESWL, Left - 2019 Hysterectomy Locm 300-399Mg /Ml Iodine,1Ml - 2019       PSH Notes: pt had surgery- pace marker on 11-13-20   NON-GU PSH: Cesarean Delivery           GU PMH: Ureteral calculus - 01/08/2021, - 11/29/2020, - 09/12/2020, - 07/20/2018 (Stable), Left, She did like the way tamsulosin made her feel so I told her to stop that. I did give her prescription today for pain medication in case she needs it and a urine strainer. She wants to proceed with lithotripsy if her stone does not pass. I will schedule this., - 2019 (Acute), Left, Her stone is 3 mm wide and is not causing significant obstruction. She said she has never passed a stone previously., - 2019 Renal and ureteral calculus - 12/27/2020, (Stable), - 10/31/2020, - 10/03/2020 Ureteral obstruction secondary to calculous, Left - 12/27/2020, (Acute), Left, She has a small left distal ureteral stone. It has a high probability of spontaneous passage and because she is not having any pain we are going to pursue medical expulsive therapy using tamsulosin., - 2019 Renal calculus, Left - 11/29/2020, Left, - 09/12/2020, Left, She does have nonobstructing, peripherally located left renal calculi., - 2019 Microscopic hematuria - 09/12/2020 Gross hematuria (Stable), No abnormality was noted within the bladder today. It appears her hematuria is secondary to the passage of her left ureteral stone. - 2019, We discussed the need to evaluate her completely for the source of her gross hematuria. We discussed the possible etiologies including a  nonobstructing stone in the kidney since her father has had stones as well as urothelial malignancy because of her past history of smoking. I am going to check her renal function, obtain a CT scan and have her return for completion of her hematuria workup with cystoscopy., - 2019     NON-GU PMH: Localized enlarged lymph nodes, We discussed the fact that she does have several slightly enlarged lymph nodes. She said she was discharged from the cancer center because it had been so long since she had had her ovarian cancer. She also mentioned that it did not result in elevation of her CA 125. I have given her a copy of the CT scan in will send a copy to her gynecologist to determine what further steps need to be taken - 2019     FAMILY HISTORY: 2 daughters - Runs in Family    SOCIAL HISTORY: Marital Status: Divorced Preferred Language: English;  Ethnicity: Not Hispanic Or Latino; Race: White Current Smoking Status: Patient does not smoke anymore.  <DIV'  Tobacco Use Assessment Completed:  Used Tobacco in last 30 days?   Social Drinker.  Does not use drugs. Does not drink caffeine. Has not had a blood transfusion.     REVIEW OF SYSTEMS:      GU Review Female:  Patient denies frequent urination, hard to postpone urination, burning /pain with urination, get up at night to urinate, leakage of urine, stream starts and stops, trouble starting your stream, have to strain to urinate, and being pregnant.     Gastrointestinal (Upper):  Patient denies nausea, vomiting, and indigestion/ heartburn.     Gastrointestinal (Lower):  Patient denies diarrhea and constipation.     Constitutional:  Patient denies fever, night sweats, weight loss, and fatigue.     Skin:  Patient denies skin rash/ lesion and itching.     Eyes:  Patient denies blurred vision and double vision.     Ears/ Nose/ Throat:  Patient denies sore throat and sinus problems.     Hematologic/Lymphatic:  Patient denies swollen glands and easy  bruising.     Cardiovascular:  Patient denies leg swelling and chest pains.     Respiratory:  Patient denies cough and shortness of breath.     Endocrine:  Patient denies excessive thirst.     Musculoskeletal:  Patient reports back pain and joint pain.      Neurological:  Patient denies headaches and dizziness.     Psychologic:  Patient denies depression and anxiety.     VITAL SIGNS:        01/13/2021 01:23 PM      Weight 183 lb / 83.01 kg      Height 64.5 in / 163.83 cm      BP 163/89 mmHg      Pulse 60 /min      Temperature 97.5 F / 36.3 C      BMI 30.9 kg/m      MULTI-SYSTEM PHYSICAL EXAMINATION:       Constitutional: Well-nourished. No physical deformities. Normally developed. Good grooming.      Neck: Neck symmetrical, not swollen. Normal tracheal position.      Respiratory: No labored breathing, no use of accessory muscles.       Cardiovascular: Normal temperature, normal extremity pulses, no swelling, no varicosities.      Lymphatic: No enlargement of neck, axillae, groin.      Skin: No paleness, no jaundice, no cyanosis. No lesion, no ulcer, no rash.      Neurologic / Psychiatric: Oriented to time, oriented to place, oriented to person. No depression, no anxiety, no agitation.      Eyes: Normal conjunctivae. Normal eyelids.      Ears, Nose, Mouth, and Throat: Left ear no scars, no lesions, no masses. Right ear no scars, no lesions, no masses. Nose no scars, no lesions, no masses. Normal hearing. Normal lips.      Musculoskeletal: Normal gait and station of head and neck.              Complexity of Data:   Source Of History:  Patient  Records Review:  Previous Doctor Records, Previous Patient Records  X-Ray Review: C.T. Abdomen/Pelvis: Reviewed Films. Reviewed Report. Discussed With Patient.     PROCEDURES:    Urinalysis w/Scope  Dipstick Dipstick Cont'd Micro  Color: Yellow Bilirubin: Neg mg/dL WBC/hpf: 0 - 5/hpf  Appearance: Clear Ketones: Neg mg/dL RBC/hpf: 0 -  2/hpf  Specific Gravity: 1.020 Blood: Trace ery/uL Bacteria: Rare (0-9/hpf)  pH: 6.5 Protein: Trace mg/dL Cystals: Ca Oxalate  Glucose: Neg mg/dL Urobilinogen: 0.2 mg/dL Casts: NS (Not Seen)   Nitrites: Neg Trichomonas: Not Present   Leukocyte Esterase: Trace leu/uL Mucous: Present    Epithelial Cells: NS (Not Seen)    Yeast: NS (Not Seen)    Sperm: Not Present    ASSESSMENT:     ICD-10 Details  1 GU:  Renal calculus - 0000000 Acute, Complicated Injury  2  Ureteral calculus - A999333 Acute, Complicated Injury   PLAN:   Document  Letter(s):  Created for Patient: Clinical Summary   Notes:  I reviewed CT findings with the patient in detail. Based on distal stone and proximal ureteral calculus I am going to set up for left ureteroscopy with laser lithotripsy and probable insertion of left JJ stent. Risks and benefits the procedure were discussed in detail. Will schedule accordingly in the near future. Patient agreeable.  I have recommended retrograde pyelogram, ureteroscopic stone manipulation with laser lithotripsy. I have discussed in detail the risks, benefits and alternatives of ureteroscopic stone extraction to include but not limited to: Bleeding, infection, ureteral perforation with need for open repair, inability to place the stent necessitating the need for further procedures, possible percutaneous nephrostomy tube placement, discomfort from the stents, hematuria, urgency, frequency and refractory problems after the stent is removed. I discussed the stent is not a permanent stent and will require a followup for stent removal or stent exchange. The patient knows there is high risk for ureteral stent incrustation if this is not removed or exchanged within 3 months. Patient voices understanding of the risks and benefits of the procedure and consents to the procedure.

## 2021-02-04 ENCOUNTER — Encounter: Payer: PPO | Admitting: Physician Assistant

## 2021-02-04 ENCOUNTER — Ambulatory Visit (HOSPITAL_BASED_OUTPATIENT_CLINIC_OR_DEPARTMENT_OTHER): Payer: PPO | Admitting: Anesthesiology

## 2021-02-04 ENCOUNTER — Encounter (HOSPITAL_BASED_OUTPATIENT_CLINIC_OR_DEPARTMENT_OTHER): Payer: Self-pay | Admitting: Urology

## 2021-02-04 ENCOUNTER — Encounter (HOSPITAL_BASED_OUTPATIENT_CLINIC_OR_DEPARTMENT_OTHER)
Admission: RE | Disposition: A | Discharge status: Home / Self Care | Payer: Self-pay | Source: Home / Self Care | Attending: Urology

## 2021-02-04 ENCOUNTER — Ambulatory Visit (HOSPITAL_BASED_OUTPATIENT_CLINIC_OR_DEPARTMENT_OTHER)
Admission: RE | Admit: 2021-02-04 | Discharge: 2021-02-04 | Disposition: A | Discharge status: Home / Self Care | Payer: PPO | Attending: Urology | Admitting: Urology

## 2021-02-04 DIAGNOSIS — Z91040 Latex allergy status: Secondary | ICD-10-CM | POA: Insufficient documentation

## 2021-02-04 DIAGNOSIS — N132 Hydronephrosis with renal and ureteral calculous obstruction: Secondary | ICD-10-CM | POA: Insufficient documentation

## 2021-02-04 DIAGNOSIS — Z881 Allergy status to other antibiotic agents status: Secondary | ICD-10-CM | POA: Insufficient documentation

## 2021-02-04 DIAGNOSIS — K219 Gastro-esophageal reflux disease without esophagitis: Secondary | ICD-10-CM | POA: Diagnosis not present

## 2021-02-04 DIAGNOSIS — M859 Disorder of bone density and structure, unspecified: Secondary | ICD-10-CM | POA: Diagnosis not present

## 2021-02-04 DIAGNOSIS — Z90710 Acquired absence of both cervix and uterus: Secondary | ICD-10-CM | POA: Diagnosis not present

## 2021-02-04 DIAGNOSIS — Z87891 Personal history of nicotine dependence: Secondary | ICD-10-CM | POA: Diagnosis not present

## 2021-02-04 DIAGNOSIS — N201 Calculus of ureter: Secondary | ICD-10-CM | POA: Diagnosis not present

## 2021-02-04 DIAGNOSIS — C569 Malignant neoplasm of unspecified ovary: Secondary | ICD-10-CM | POA: Diagnosis not present

## 2021-02-04 HISTORY — DX: Presence of cardiac pacemaker: Z95.0

## 2021-02-04 HISTORY — PX: HOLMIUM LASER APPLICATION: SHX5852

## 2021-02-04 HISTORY — DX: Gastro-esophageal reflux disease without esophagitis: K21.9

## 2021-02-04 HISTORY — DX: Sick sinus syndrome: I49.5

## 2021-02-04 HISTORY — DX: Presence of spectacles and contact lenses: Z97.3

## 2021-02-04 HISTORY — DX: Personal history of other diseases of the digestive system: Z87.19

## 2021-02-04 HISTORY — DX: Presence of external hearing-aid: Z97.4

## 2021-02-04 HISTORY — PX: CYSTOSCOPY WITH RETROGRADE PYELOGRAM, URETEROSCOPY AND STENT PLACEMENT: SHX5789

## 2021-02-04 HISTORY — DX: Sarcoidosis, unspecified: D86.9

## 2021-02-04 HISTORY — DX: Essential (primary) hypertension: I10

## 2021-02-04 HISTORY — DX: Myoneural disorder, unspecified: G70.9

## 2021-02-04 LAB — POCT I-STAT, CHEM 8
BUN: 16 mg/dL (ref 8–23)
BUN: 16 mg/dL (ref 8–23)
Calcium, Ion: 1.16 mmol/L (ref 1.15–1.40)
Calcium, Ion: 1.19 mmol/L (ref 1.15–1.40)
Chloride: 104 mmol/L (ref 98–111)
Chloride: 105 mmol/L (ref 98–111)
Creatinine, Ser: 0.6 mg/dL (ref 0.44–1.00)
Creatinine, Ser: 0.7 mg/dL (ref 0.44–1.00)
Glucose, Bld: 99 mg/dL (ref 70–99)
Glucose, Bld: 99 mg/dL (ref 70–99)
HCT: 39 % (ref 36.0–46.0)
HCT: 39 % (ref 36.0–46.0)
Hemoglobin: 13.3 g/dL (ref 12.0–15.0)
Hemoglobin: 13.3 g/dL (ref 12.0–15.0)
Potassium: 3.3 mmol/L — ABNORMAL LOW (ref 3.5–5.1)
Potassium: 3.3 mmol/L — ABNORMAL LOW (ref 3.5–5.1)
Sodium: 141 mmol/L (ref 135–145)
Sodium: 142 mmol/L (ref 135–145)
TCO2: 25 mmol/L (ref 22–32)
TCO2: 25 mmol/L (ref 22–32)

## 2021-02-04 LAB — SARS CORONAVIRUS 2 (TAT 6-24 HRS): SARS Coronavirus 2: NEGATIVE

## 2021-02-04 SURGERY — CYSTOURETEROSCOPY, WITH RETROGRADE PYELOGRAM AND STENT INSERTION
Anesthesia: General | Site: Pelvis | Laterality: Left

## 2021-02-04 MED ORDER — SODIUM CHLORIDE 0.9 % IR SOLN
Status: DC | PRN
  Administered 2021-02-04: 4000 mL

## 2021-02-04 MED ORDER — DEXAMETHASONE SODIUM PHOSPHATE 10 MG/ML IJ SOLN
INTRAMUSCULAR | Status: DC | PRN
  Administered 2021-02-04: 10 mg via INTRAVENOUS

## 2021-02-04 MED ORDER — IOHEXOL 300 MG/ML  SOLN
INTRAMUSCULAR | Status: DC | PRN
  Administered 2021-02-04: 10 mL via URETHRAL

## 2021-02-04 MED ORDER — ACETAMINOPHEN 500 MG PO TABS
ORAL_TABLET | ORAL | Status: AC
  Filled 2021-02-04: qty 2

## 2021-02-04 MED ORDER — PHENYLEPHRINE 40 MCG/ML (10ML) SYRINGE FOR IV PUSH (FOR BLOOD PRESSURE SUPPORT)
PREFILLED_SYRINGE | INTRAVENOUS | Status: AC
  Filled 2021-02-04: qty 10

## 2021-02-04 MED ORDER — CEFAZOLIN SODIUM-DEXTROSE 2-4 GM/100ML-% IV SOLN
2.0000 g | INTRAVENOUS | Status: AC
  Administered 2021-02-04: 2 g via INTRAVENOUS

## 2021-02-04 MED ORDER — PROPOFOL 10 MG/ML IV BOLUS
INTRAVENOUS | Status: DC | PRN
  Administered 2021-02-04: 160 mg via INTRAVENOUS

## 2021-02-04 MED ORDER — MIDAZOLAM HCL 5 MG/5ML IJ SOLN
INTRAMUSCULAR | Status: DC | PRN
  Administered 2021-02-04: 1 mg via INTRAVENOUS

## 2021-02-04 MED ORDER — ONDANSETRON HCL 4 MG/2ML IJ SOLN
INTRAMUSCULAR | Status: DC | PRN
  Administered 2021-02-04: 4 mg via INTRAVENOUS

## 2021-02-04 MED ORDER — DEXAMETHASONE SODIUM PHOSPHATE 10 MG/ML IJ SOLN
INTRAMUSCULAR | Status: AC
  Filled 2021-02-04: qty 1

## 2021-02-04 MED ORDER — EPHEDRINE SULFATE-NACL 50-0.9 MG/10ML-% IV SOSY
PREFILLED_SYRINGE | INTRAVENOUS | Status: DC | PRN
  Administered 2021-02-04: 7.5 mg via INTRAVENOUS
  Administered 2021-02-04: 2.5 mg via INTRAVENOUS

## 2021-02-04 MED ORDER — LACTATED RINGERS IV SOLN
INTRAVENOUS | Status: DC
  Administered 2021-02-04: 1000 mL via INTRAVENOUS

## 2021-02-04 MED ORDER — LIDOCAINE 2% (20 MG/ML) 5 ML SYRINGE
INTRAMUSCULAR | Status: DC | PRN
  Administered 2021-02-04: 100 mg via INTRAVENOUS

## 2021-02-04 MED ORDER — OXYCODONE HCL 5 MG/5ML PO SOLN: 5.0000 mg | Freq: Once | ORAL | Status: DC | PRN

## 2021-02-04 MED ORDER — ACETAMINOPHEN 500 MG PO TABS
1000.0000 mg | ORAL_TABLET | Freq: Once | ORAL | Status: AC
  Administered 2021-02-04: 1000 mg via ORAL

## 2021-02-04 MED ORDER — PHENYLEPHRINE 40 MCG/ML (10ML) SYRINGE FOR IV PUSH (FOR BLOOD PRESSURE SUPPORT)
PREFILLED_SYRINGE | INTRAVENOUS | Status: DC | PRN
  Administered 2021-02-04: 80 ug via INTRAVENOUS
  Administered 2021-02-04: 40 ug via INTRAVENOUS
  Administered 2021-02-04: 80 ug via INTRAVENOUS
  Administered 2021-02-04: 40 ug via INTRAVENOUS

## 2021-02-04 MED ORDER — MIDAZOLAM HCL 2 MG/2ML IJ SOLN
INTRAMUSCULAR | Status: AC
  Filled 2021-02-04: qty 2

## 2021-02-04 MED ORDER — CEFAZOLIN SODIUM-DEXTROSE 2-4 GM/100ML-% IV SOLN
INTRAVENOUS | Status: AC
  Filled 2021-02-04: qty 100

## 2021-02-04 MED ORDER — PROPOFOL 10 MG/ML IV BOLUS
INTRAVENOUS | Status: AC
  Filled 2021-02-04: qty 20

## 2021-02-04 MED ORDER — FENTANYL CITRATE (PF) 100 MCG/2ML IJ SOLN
INTRAMUSCULAR | Status: DC | PRN
  Administered 2021-02-04 (×2): 50 ug via INTRAVENOUS
  Administered 2021-02-04: 25 ug via INTRAVENOUS

## 2021-02-04 MED ORDER — FENTANYL CITRATE (PF) 100 MCG/2ML IJ SOLN
INTRAMUSCULAR | Status: AC
  Filled 2021-02-04: qty 2

## 2021-02-04 MED ORDER — ONDANSETRON HCL 4 MG/2ML IJ SOLN
INTRAMUSCULAR | Status: AC
  Filled 2021-02-04: qty 2

## 2021-02-04 MED ORDER — LIDOCAINE 2% (20 MG/ML) 5 ML SYRINGE
INTRAMUSCULAR | Status: AC
  Filled 2021-02-04: qty 5

## 2021-02-04 MED ORDER — TRAMADOL HCL 50 MG PO TABS: 50.0000 mg | ORAL_TABLET | Freq: Four times a day (QID) | ORAL | 0 refills | Status: DC | PRN

## 2021-02-04 MED ORDER — EPHEDRINE 5 MG/ML INJ
INTRAVENOUS | Status: AC
  Filled 2021-02-04: qty 10

## 2021-02-04 MED ORDER — FENTANYL CITRATE (PF) 100 MCG/2ML IJ SOLN: 25.0000 ug | INTRAMUSCULAR | Status: DC | PRN

## 2021-02-04 MED ORDER — PROMETHAZINE HCL 25 MG/ML IJ SOLN
6.2500 mg | INTRAMUSCULAR | Status: DC | PRN
Start: 2021-02-04 — End: 2021-02-04

## 2021-02-04 MED ORDER — KETOROLAC TROMETHAMINE 30 MG/ML IJ SOLN
INTRAMUSCULAR | Status: DC | PRN
  Administered 2021-02-04: 30 mg via INTRAVENOUS

## 2021-02-04 MED ORDER — OXYCODONE HCL 5 MG PO TABS: 5.0000 mg | ORAL_TABLET | Freq: Once | ORAL | Status: DC | PRN

## 2021-02-04 SURGICAL SUPPLY — 29 items
BAG DRAIN URO-CYSTO SKYTR STRL (DRAIN) ×2 IMPLANT
BAG DRN UROCATH (DRAIN) ×1
BASKET STONE 1.7 NGAGE (UROLOGICAL SUPPLIES) ×1 IMPLANT
BULB IRRIG PATHFIND (MISCELLANEOUS) ×1 IMPLANT
CATH URET 5FR 28IN OPEN ENDED (CATHETERS) ×2 IMPLANT
CLOTH BEACON ORANGE TIMEOUT ST (SAFETY) ×2 IMPLANT
EXTRACTOR STONE 1.7FRX115CM (UROLOGICAL SUPPLIES) IMPLANT
FIBER LASER FLEXIVA 365 (UROLOGICAL SUPPLIES) IMPLANT
GLOVE SURG ENC MOIS LTX SZ7.5 (GLOVE) ×2 IMPLANT
GOWN STRL REUS W/TWL XL LVL3 (GOWN DISPOSABLE) ×2 IMPLANT
GUIDEWIRE STR DUAL SENSOR (WIRE) ×2 IMPLANT
GUIDEWIRE ZIPWRE .038 STRAIGHT (WIRE) IMPLANT
INFUSOR MANOMETER BAG 3000ML (MISCELLANEOUS) ×1 IMPLANT
IV NS 1000ML (IV SOLUTION)
IV NS 1000ML BAXH (IV SOLUTION) ×1 IMPLANT
IV NS IRRIG 3000ML ARTHROMATIC (IV SOLUTION) ×3 IMPLANT
KIT TURNOVER CYSTO (KITS) ×2 IMPLANT
MANIFOLD NEPTUNE II (INSTRUMENTS) ×2 IMPLANT
NS IRRIG 500ML POUR BTL (IV SOLUTION) ×2 IMPLANT
PACK CYSTO (CUSTOM PROCEDURE TRAY) ×2 IMPLANT
SHEATH URETERAL 12FRX28CM (UROLOGICAL SUPPLIES) ×1 IMPLANT
STENT URET 6FRX24 CONTOUR (STENTS) ×2 IMPLANT
STENT URET 6FRX26 CONTOUR (STENTS) IMPLANT
SYR 10ML LL (SYRINGE) ×1 IMPLANT
SYR 20ML LL LF (SYRINGE) ×2 IMPLANT
TRACTIP FLEXIVA PULS ID 200XHI (Laser) IMPLANT
TRACTIP FLEXIVA PULSE ID 200 (Laser) ×2
TUBE CONNECTING 12X1/4 (SUCTIONS) ×1 IMPLANT
TUBING UROLOGY SET (TUBING) ×1 IMPLANT

## 2021-02-04 NOTE — Anesthesia Postprocedure Evaluation (Signed)
Anesthesia Post Note  Patient: Julia Solis  Procedure(s) Performed: CYSTOSCOPY WITH RETROGRADE PYELOGRAM, URETEROSCOPY AND STENT PLACEMENT (Left Pelvis) HOLMIUM LASER APPLICATION (Left Pelvis)     Patient location during evaluation: PACU Anesthesia Type: General Level of consciousness: awake and alert and oriented Pain management: pain level controlled Vital Signs Assessment: post-procedure vital signs reviewed and stable Respiratory status: spontaneous breathing, nonlabored ventilation and respiratory function stable Cardiovascular status: blood pressure returned to baseline Postop Assessment: no apparent nausea or vomiting Anesthetic complications: no   No complications documented.  Last Vitals:  Vitals:   02/04/21 1145 02/04/21 1202  BP: (!) 165/81 (!) 161/79  Pulse: 66 64  Resp: 18 16  Temp:    SpO2: 97% 97%    Last Pain:  Vitals:   02/04/21 0902  TempSrc: Oral                 Brennan Bailey

## 2021-02-04 NOTE — Transfer of Care (Signed)
Immediate Anesthesia Transfer of Care Note  Patient: Julia Solis  Procedure(s) Performed: CYSTOSCOPY WITH RETROGRADE PYELOGRAM, URETEROSCOPY AND STENT PLACEMENT (Left Pelvis) HOLMIUM LASER APPLICATION (Left Pelvis)  Patient Location: PACU  Anesthesia Type:General  Level of Consciousness: awake, alert , oriented and patient cooperative  Airway & Oxygen Therapy: Patient Spontanous Breathing and Patient connected to nasal cannula oxygen  Post-op Assessment: Report given to RN and Post -op Vital signs reviewed and stable  Post vital signs: Reviewed and stable  Last Vitals:  Vitals Value Taken Time  BP 162/83 02/04/21 1117  Temp 36.4 C 02/04/21 1117  Pulse 69 02/04/21 1119  Resp 15 02/04/21 1119  SpO2 98 % 02/04/21 1119  Vitals shown include unvalidated device data.  Last Pain:  Vitals:   02/04/21 0902  TempSrc: Oral      Patients Stated Pain Goal: 5 (20/94/70 9628)  Complications: No complications documented.

## 2021-02-04 NOTE — Anesthesia Preprocedure Evaluation (Addendum)
Anesthesia Evaluation  Patient identified by MRN, date of birth, ID band Patient awake    Reviewed: Allergy & Precautions, NPO status , Patient's Chart, lab work & pertinent test results  History of Anesthesia Complications (+) PONV and history of anesthetic complications  Airway Mallampati: II  TM Distance: >3 FB Neck ROM: Full    Dental no notable dental hx.    Pulmonary former smoker,    Pulmonary exam normal        Cardiovascular hypertension, Pt. 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  Reproductive/Obstetrics negative OB ROS                            Anesthesia Physical Anesthesia Plan  ASA: II  Anesthesia Plan: General   Post-op Pain Management:    Induction: Intravenous  PONV Risk Score and Plan: 4 or greater and Treatment may vary due to age or medical condition, Ondansetron, Dexamethasone and Midazolam  Airway Management Planned: LMA  Additional Equipment: None  Intra-op Plan:   Post-operative Plan: Extubation in OR  Informed Consent: I have reviewed the patients History and Physical, chart, labs and discussed the procedure including the risks, benefits and alternatives for the proposed anesthesia with the patient or authorized representative who has indicated his/her understanding and acceptance.      Dental advisory given  Plan Discussed with: CRNA  Anesthesia Plan Comments:        Anesthesia Quick Evaluation

## 2021-02-04 NOTE — Interval H&P Note (Signed)
History and Physical Interval Note:  02/04/2021 8:53 AM  Julia Solis  has presented today for surgery, with the diagnosis of LEFT URETERAL CALCULI.  The various methods of treatment have been discussed with the patient and family. After consideration of risks, benefits and other options for treatment, the patient has consented to  Procedure(s) with comments: CYSTOSCOPY WITH RETROGRADE PYELOGRAM, URETEROSCOPY AND STENT PLACEMENT (Left) - 1 HR HOLMIUM LASER APPLICATION (Left) as a surgical intervention.  The patient's history has been reviewed, patient examined, no change in status, stable for surgery.  I have reviewed the patient's chart and labs.  Questions were answered to the patient's satisfaction.     Remi Haggard

## 2021-02-04 NOTE — Op Note (Addendum)
Preoperative diagnosis:  1.  Left distal and proximal ureteral calculi  Postoperative diagnosis: 1.  Same  Procedure(s): 1.  Cystoscopy, left retrograde pyelogram with intraoperative interpretation, left ureteroscopy and pyeloscopy with laser lithotripsy of distal and proximal ureteral and renal calculi, stone extractions, insertion left JJ stent  Surgeon: Dr. Harold Barban  Anesthesia: General  Complications: None  EBL: Minimal  Specimens: Kidney stones  Disposition of specimens: With patient  Intraoperative findings: 6 mm left distal ureteral calculi with significant ureteral edema stone was fragmented completely extracted.  Patient had approximate 8 mm proximal left ureteral calculus just distal to the UPJ.  This was impacted within the wall of the ureter the laser utilized to fragment the stone and free it from the left proximal ureter stone was manipulated back to the kidney where it was fragmented and significant fragments were extracted.  Other fragments were dusted into fragments passable.  6 [place] by 24 cm Percuflex plus soft contour stent placed  Indication: Patient is a 70 year old white female sent to ureteral calculi 1 6 mm distal 1 1 8  mm proximal with failure to progress but surprisingly minimally symptomatic.  She has had worsening hydronephrosis on recent CT scan presents at this time undergo cystoscopy and ureteroscopy and laser lithotripsy of both ureteral calculi.  Description of procedure:  After obtaining informed consent for the patient she was taken major cystoscopy suite placed under general anesthesia.  Placed in dorsolithotomy position genitalia prepped and draped in usual sterile fashion.  Proper pause and timeout was performed for site of procedure.  19 Fransico was advanced in the bladder without difficulty.  Left ureteral orifice was easily visualized and retrograde pyelogram confirmed filling defect distally and proximally consistent with the stones seen  in the above-noted findings.  A sensor wire was subsequently passed through the operative catheter manipulated up to the renal pelvis without difficulty.  The 6.4 French semirigid short ureteroscope was then advanced alongside the guidewire to the level of the distal stone.  Utilizing the 200 m holmium laser fiber the stone was fragmented into numerous pieces some of which irrigated into the bladder and a couple of which I extracted utilizing an engage basket.  Final look in the ureter revealed no remaining fragments.  Ureteroscope was advanced alongside the guidewire but I could not reach the proximal stone due to the angle going over the iliac vessels.  The semirigid scope was removed and the short 13 French ureteral access catheter was subsequently passed over the guidewire under fluoroscopy and advanced up the left ureter without difficulty just proximal to the ureteral calculus.  The digital flexible ureteroscope was then passed through the access sheath and manipulated up to the level of the stone.  Utilizing the holmium laser fiber stone was slowly fragmented as it was impacted within the wall of the ureter.  After I fragmented some of the outer edges of the stone stone freed up and migrated proximally into the renal pelvis.  Flexible scope was advanced over the renal pelvis and utilizing the dust settings the stone was fragmented into numerous small pieces several which were individually extracted with separate passes of the ureteroscope.  The remaining fragments were so small they should pass without difficulty.  The kidney was explored and no remaining significant fragments were noted.  Retrograde pyelogram through the flexible scope revealed good integrity of the collecting system without evidence of extravasation or significant filling defects.  The guidewire was passed through the flexible scope and passed to the  upper pole calyx.  The scope and access sheath were then backed down the ureter  visualizing the ureter along its length with no fragments noted leaving the guidewire in place.  The wire was then back fed through the cystoscope and a 6 [place] by 24 cm Percuflex plus soft Contour stent was placed leaving a proximal coil in the renal pelvis and distal coil in the bladder.  There is good flow of urine through and around the stent noted.  The bladder was emptied procedure terminated she was awakened from anesthesia and taken back to the recovery room in stable condition.  No immediate complication from the procedure.

## 2021-02-04 NOTE — Anesthesia Procedure Notes (Signed)
Procedure Name: LMA Insertion Date/Time: 02/04/2021 9:29 AM Performed by: Rogers Blocker, CRNA Pre-anesthesia Checklist: Patient identified, Emergency Drugs available, Suction available and Patient being monitored Patient Re-evaluated:Patient Re-evaluated prior to induction Oxygen Delivery Method: Circle System Utilized Preoxygenation: Pre-oxygenation with 100% oxygen Induction Type: IV induction Ventilation: Mask ventilation without difficulty LMA: LMA inserted LMA Size: 4.0 Number of attempts: 1 Placement Confirmation: positive ETCO2 Tube secured with: Tape Dental Injury: Teeth and Oropharynx as per pre-operative assessment

## 2021-02-04 NOTE — Discharge Instructions (Signed)
Please bring stone fragments to follow-up appointment.    Post Anesthesia Home Care Instructions  Activity: Get plenty of rest for the remainder of the day. A responsible individual must stay with you for 24 hours following the procedure.  For the next 24 hours, DO NOT: -Drive a car -Paediatric nurse -Drink alcoholic beverages -Take any medication unless instructed by your physician -Make any legal decisions or sign important papers.  Meals: Start with liquid foods such as gelatin or soup. Progress to regular foods as tolerated. Avoid greasy, spicy, heavy foods. If nausea and/or vomiting occur, drink only clear liquids until the nausea and/or vomiting subsides. Call your physician if vomiting continues.  Special Instructions/Symptoms: Your throat may feel dry or sore from the anesthesia or the breathing tube placed in your throat during surgery. If this causes discomfort, gargle with warm salt water. The discomfort should disappear within 24 hours.  If you had a scopolamine patch placed behind your ear for the management of post- operative nausea and/or vomiting:  1. The medication in the patch is effective for 72 hours, after which it should be removed.  Wrap patch in a tissue and discard in the trash. Wash hands thoroughly with soap and water. 2. You may remove the patch earlier than 72 hours if you experience unpleasant side effects which may include dry mouth, dizziness or visual disturbances. 3. Avoid touching the patch. Wash your hands with soap and water after contact with the patch.    Next dose of Tylenol after 3 pm.

## 2021-02-05 ENCOUNTER — Encounter (HOSPITAL_BASED_OUTPATIENT_CLINIC_OR_DEPARTMENT_OTHER): Payer: Self-pay | Admitting: Urology

## 2021-02-12 DIAGNOSIS — N202 Calculus of kidney with calculus of ureter: Secondary | ICD-10-CM | POA: Diagnosis not present

## 2021-02-13 ENCOUNTER — Ambulatory Visit (INDEPENDENT_AMBULATORY_CARE_PROVIDER_SITE_OTHER): Payer: PPO

## 2021-02-13 DIAGNOSIS — I495 Sick sinus syndrome: Secondary | ICD-10-CM

## 2021-02-14 ENCOUNTER — Other Ambulatory Visit: Payer: Self-pay

## 2021-02-14 ENCOUNTER — Ambulatory Visit: Payer: PPO | Admitting: Internal Medicine

## 2021-02-14 ENCOUNTER — Encounter: Payer: Self-pay | Admitting: Internal Medicine

## 2021-02-14 VITALS — BP 124/80 | HR 78 | Ht 64.0 in | Wt 188.6 lb

## 2021-02-14 DIAGNOSIS — I491 Atrial premature depolarization: Secondary | ICD-10-CM

## 2021-02-14 DIAGNOSIS — R001 Bradycardia, unspecified: Secondary | ICD-10-CM

## 2021-02-14 DIAGNOSIS — I471 Supraventricular tachycardia: Secondary | ICD-10-CM | POA: Diagnosis not present

## 2021-02-14 DIAGNOSIS — I10 Essential (primary) hypertension: Secondary | ICD-10-CM

## 2021-02-14 LAB — CUP PACEART REMOTE DEVICE CHECK
Date Time Interrogation Session: 20220512065857
Implantable Lead Implant Date: 20220201
Implantable Lead Implant Date: 20220201
Implantable Lead Location: 753859
Implantable Lead Location: 753860
Implantable Pulse Generator Implant Date: 20220201
Pulse Gen Model: 2272
Pulse Gen Serial Number: 3896658

## 2021-02-14 NOTE — Patient Instructions (Addendum)
Medication Instructions:  Your physician recommends that you continue on your current medications as directed. Please refer to the Current Medication list given to you today.  Labwork: None ordered.  Testing/Procedures: None ordered.  Follow-Up:  [id number] Your physician wants you to follow-up in: one year with Thompson Grayer, MD or one of the following Advanced Practice Providers on your designated Care Team:      Tommye Standard, PA-C    You will receive a reminder letter in the mail two months in advance. If you don't receive a letter, please call our office to schedule the follow-up appointment.  Remote monitoring is used to monitor your Pacemaker of ICD from home. This monitoring reduces the number of office visits required to check your device to one time per year. It allows [place] to keep an eye on the functioning of your device to ensure it is working properly. You are scheduled for a device check from home on 05/15/21. You may send your transmission at any time that day. If you have a wireless device, the transmission will be sent automatically. After your physician reviews your transmission, you will receive a postcard with your next transmission date.  Any Other Special Instructions Will Be Listed Below (If Applicable).  If you need a refill on your cardiac medications before your next appointment, please call your pharmacy.

## 2021-02-14 NOTE — Progress Notes (Signed)
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  Since last being seen in our clinic, the patient reports doing very well.  She continues to have some fatigue.  Today, she denies symptoms of palpitations, chest pain, shortness of breath,  lower extremity edema, dizziness, presyncope, or syncope.  The patient is otherwise without complaint today.   Past Medical History:  Diagnosis Date  . Cancer ([place]) 2011   ovarian  . Complication of anesthesia   . GERD (gastroesophageal reflux disease)   . Hearing aid worn    both ears  . History of chemotherapy 2011  . History of hiatal hernia   . History of kidney stones   . Hypertension   . Neuromuscular disorder ([place])    left foot numbness since  eback surgery  . PONV (postoperative nausea and vomiting)   . Presence of permanent cardiac pacemaker   . Sarcoidosis    in remission sees pumonary  once a year dr bellinger lov 08-21-2020 care everywhere  . Sick sinus syndrome ([place])   . Wears glasses    Past Surgical History:  Procedure Laterality Date  . ABDOMINAL HYSTERECTOMY  2011  . APPENDECTOMY  2011    ?with hysterectomy  . BACK SURGERY  1997   lumbar  . [place]  . COSMETIC SURGERY  2007   brow lift  . CYSTOSCOPY WITH RETROGRADE PYELOGRAM, URETEROSCOPY AND STENT PLACEMENT Left 02/04/2021   Procedure: CYSTOSCOPY WITH RETROGRADE PYELOGRAM, URETEROSCOPY AND STENT PLACEMENT;  Surgeon: Remi Haggard, MD;  Location: [facility];  Service: Urology;  Laterality: Left;  1 HR  . etopic preg  1980  . EXTRACORPOREAL SHOCK WAVE LITHOTRIPSY Left 06/23/2018   Procedure: LEFT EXTRACORPOREAL SHOCK WAVE LITHOTRIPSY (ESWL);  Surgeon: Cleon Gustin, MD;  Location: [facility];  Service: Urology;  Laterality: Left;  . GANGLION CYST EXCISION  1975  . HAND SURGERY  50 yrs ago   right  . HOLMIUM LASER APPLICATION Left  03/08/159   Procedure: HOLMIUM LASER APPLICATION;  Surgeon: Remi Haggard, MD;  Location: [facility];  Service: Urology;  Laterality: Left;  . ovarian cancer resection  2011   chemotherapy done  . PACEMAKER IMPLANT N/A 11/13/2020   Procedure: PACEMAKER IMPLANT;  Surgeon: Thompson Grayer, MD;  Location: [facility];  Service: Cardiovascular;  Laterality: N/A;  . TONSILLECTOMY  1957   adenoids removed    ROS- all systems are reviewed and negative except as per HPI above  Current Outpatient Medications  Medication Sig Dispense Refill  . calcium carbonate (TUMS - DOSED IN MG ELEMENTAL CALCIUM) 500 MG chewable tablet Chew 1 tablet by mouth 2 (two) times daily as needed for indigestion or heartburn.    . Cholecalciferol (VITAMIN D) 50 MCG (2000 UT) tablet Take 2,000 Units by mouth daily.    . diclofenac Sodium (VOLTAREN) 1 % GEL Apply 1 application topically 4 (four) times daily as needed (pain).    . famotidine (PEPCID) 10 MG tablet Take 10 mg by mouth daily as needed for heartburn or indigestion.    Marland Kitchen ibuprofen (ADVIL) 200 MG tablet Take 400 mg by mouth every 6 (six) hours as needed for headache or moderate pain.    . Loratadine 10 MG CAPS Take 10 mg by mouth daily.    Marland Kitchen LORazepam (ATIVAN) 1 MG tablet TAKE  1 TABLET BY MOUTH EVERY DAY AT BEDTIME AS NEEDED 90 tablet 0  . losartan (COZAAR) 25 MG tablet Take 1 tablet (25 mg total) by mouth daily. 90 tablet 3  . meloxicam (MOBIC) 15 MG tablet TAKE 1 TABLET BY MOUTH EVERY DAY 90 tablet 0  . Polyethyl Glycol-Propyl Glycol (SYSTANE OP) Place 1 drop into both eyes every morning.    Vladimir Faster Glycol-Propyl Glycol (SYSTANE) 0.4-0.3 % GEL ophthalmic gel Place 1 application into both eyes at bedtime.    . polyethylene glycol (MIRALAX / GLYCOLAX) packet Take 17 g by mouth daily as needed for moderate constipation.    . rosuvastatin (CRESTOR) 5 MG tablet TAKE 1 TABLET BY MOUTH 3 TIMES A WEEK WITH SUPPER 36 tablet 1  . sodium chloride  (OCEAN) 0.65 % SOLN nasal spray Place 1 spray into both nostrils as needed for congestion.     No current facility-administered medications for this visit.    Physical Exam: Vitals:   02/14/21 1418  BP: 124/80  Pulse: 78  SpO2: 98%  Weight: 188 lb 9.6 oz (85.5 kg)  Height: 5\' 4"  (1.626 m)    GEN- The patient is well appearing, alert and oriented x 3 today.   Head- normocephalic, atraumatic Eyes-  Sclera clear, conjunctiva pink Ears- hearing intact Oropharynx- clear Lungs-   normal work of breathing Chest- pacemaker pocket is well healed Heart- Regular rate and rhythm  GI- soft  Extremities- no clubbing, cyanosis, or edema  Pacemaker interrogation- reviewed in detail today,  See PACEART report  ekg tracing ordered today is personally reviewed and shows sinus rhythm  Assessment and Plan:  1. Symptomatic sinus bradycardia   Normal pacemaker function See Pace Art report Rate response turned on today she is not device dependant today  2. HTN Stable No change required today  3. Nonsustained atch/ PACs Stable No change required today   Risks, benefits and potential toxicities for medications prescribed and/or refilled reviewed with patient today.   Return to see EP PA in a year  Thompson Grayer MD, [facility] 02/14/2021 2:33 PM

## 2021-02-17 ENCOUNTER — Telehealth: Payer: Self-pay | Admitting: Internal Medicine

## 2021-02-17 NOTE — Telephone Encounter (Signed)
Lipid panel can be repeated with Annual visit in November. She may call pharmamcy and ask them to send [place] refill request. I am OK waiting to repeat panel in November.

## 2021-02-17 NOTE — Telephone Encounter (Signed)
Pt called and wanted to know if she needed to do blood work again for rosuvastatin (CRESTOR) 5 MG tablet or if it could just be refilled, She wasn't sure what Dr Renold Genta wanted. Please advise

## 2021-02-17 NOTE — Telephone Encounter (Signed)
Patient notified, she will the drug store.

## 2021-02-21 ENCOUNTER — Other Ambulatory Visit: Payer: Self-pay | Admitting: Internal Medicine

## 2021-03-07 NOTE — Progress Notes (Signed)
Remote pacemaker transmission.   

## 2021-03-14 DIAGNOSIS — Z20822 Contact with and (suspected) exposure to covid-19: Secondary | ICD-10-CM | POA: Diagnosis not present

## 2021-04-01 ENCOUNTER — Other Ambulatory Visit: Payer: Self-pay

## 2021-04-01 ENCOUNTER — Ambulatory Visit (INDEPENDENT_AMBULATORY_CARE_PROVIDER_SITE_OTHER): Payer: PPO | Admitting: Internal Medicine

## 2021-04-01 ENCOUNTER — Telehealth: Payer: Self-pay

## 2021-04-01 VITALS — Temp 98.7°F

## 2021-04-01 DIAGNOSIS — H1032 Unspecified acute conjunctivitis, left eye: Secondary | ICD-10-CM | POA: Diagnosis not present

## 2021-04-01 DIAGNOSIS — Z87442 Personal history of urinary calculi: Secondary | ICD-10-CM

## 2021-04-01 DIAGNOSIS — R059 Cough, unspecified: Secondary | ICD-10-CM

## 2021-04-01 DIAGNOSIS — Z8679 Personal history of other diseases of the circulatory system: Secondary | ICD-10-CM

## 2021-04-01 DIAGNOSIS — D860 Sarcoidosis of lung: Secondary | ICD-10-CM | POA: Diagnosis not present

## 2021-04-01 DIAGNOSIS — Z950 Presence of cardiac pacemaker: Secondary | ICD-10-CM

## 2021-04-01 DIAGNOSIS — J029 Acute pharyngitis, unspecified: Secondary | ICD-10-CM | POA: Diagnosis not present

## 2021-04-01 DIAGNOSIS — J069 Acute upper respiratory infection, unspecified: Secondary | ICD-10-CM

## 2021-04-01 DIAGNOSIS — Z8543 Personal history of malignant neoplasm of ovary: Secondary | ICD-10-CM

## 2021-04-01 MED ORDER — TOBRAMYCIN-DEXAMETHASONE 0.3-0.1 % OP SUSP: OPHTHALMIC | 0 refills | Status: DC

## 2021-04-01 MED ORDER — DOXYCYCLINE HYCLATE 100 MG PO TABS: 100.0000 mg | ORAL_TABLET | Freq: Two times a day (BID) | ORAL | 0 refills | Status: DC

## 2021-04-01 MED ORDER — HYDROCODONE BIT-HOMATROP MBR 5-1.5 MG/5ML PO SOLN: 5.0000 mL | Freq: Three times a day (TID) | ORAL | 0 refills | Status: DC | PRN

## 2021-04-01 NOTE — Telephone Encounter (Addendum)
Patient called started with a cold over the weekend now she has a cough, sore throat, hoarse and watery eyes she thinks she needs something for her eyes. Temperature is 97.7, she did a home COVID test today and it was negative. She is requesting an appointment.   [phone]

## 2021-04-01 NOTE — Telephone Encounter (Signed)
Scheduled

## 2021-04-02 LAB — SARS-COV-2 RNA,(COVID-19) QUALITATIVE NAAT: SARS CoV2 RNA: NOT DETECTED

## 2021-04-02 NOTE — Progress Notes (Signed)
   Subjective:    Patient ID: Julia Solis, female    DOB: 05-15-1951, 70 y.o.   MRN: [id number]  HPI 70 year old Female seen with cough, sore throat hoarseness and coryza.  She had a negative COVID PCR test on June 10 which was negative done through [facility].  She has developed redness of left eye.  She did a home COVID test today that was negative.  Has spent time with family and children recently.  Home COVID test today was negative.  History of kidney stones treated by Dr. Milford Cage in May with cystoscopy, left retrograde pyelogram with intraoperative interpretation, left ureteroscopy, pyeloscopy and laser lithotripsy of distal and proximal ureteral and renal calculi, stone extractions and insertion of JJ stent.  Saw Dr. Rayann Heman regarding Cardiology follow-up having had pacemaker placed in February 2022 with symptomatic bradycardia/sick sinus syndrome.  History of hypertension treated with low-dose losartan.  History of sarcoidosis followed at [facility].     Review of Systems complains of cough, left eye inflammation associated with respiratory infection with some drainage, no shaking chills     Objective:   Physical Exam Skin warm and dry.  Pharynx slightly injected.  Has conjunctivitis left eye.  TMs clear.  Chest is clear to auscultation.  She does not have elevated temp.       Assessment & Plan:   Acute upper respiratory infection with left eye conjunctivitis.  COVID PCR test obtained and proved to be negative.  History of sick sinus syndrome requiring pacemaker  History of sarcoidosis (pulmonary)  History of kidney stones  Remote history of ovarian cancer  Plan: Doxycycline 100 mg twice daily for 10 days.  Hycodan 1 teaspoon every 8 hours as needed for cough.  Rest and drink fluids.  TobraDex 2 drops in left eye 4 times a day for 5 days.

## 2021-04-12 ENCOUNTER — Encounter: Payer: Self-pay | Admitting: Internal Medicine

## 2021-04-12 NOTE — Patient Instructions (Addendum)
COVID-19 PCR test obtained.  Use TobraDex 2 drops in left eye 4 times a day for 5 to 7 days.  Take doxycycline 100 mg twice daily for 10 days.  Use Hycodan sparingly for cough and sore throat pain 1 teaspoon every 8 hours as needed.  Rest and drink fluids.

## 2021-04-24 ENCOUNTER — Other Ambulatory Visit: Payer: Self-pay

## 2021-04-24 ENCOUNTER — Ambulatory Visit (INDEPENDENT_AMBULATORY_CARE_PROVIDER_SITE_OTHER): Payer: PPO | Admitting: Internal Medicine

## 2021-04-24 ENCOUNTER — Encounter: Payer: Self-pay | Admitting: Internal Medicine

## 2021-04-24 VITALS — BP 120/80 | HR 88 | Temp 97.8°F | Ht 64.0 in | Wt 186.0 lb

## 2021-04-24 DIAGNOSIS — Z950 Presence of cardiac pacemaker: Secondary | ICD-10-CM | POA: Diagnosis not present

## 2021-04-24 DIAGNOSIS — R059 Cough, unspecified: Secondary | ICD-10-CM

## 2021-04-24 DIAGNOSIS — D860 Sarcoidosis of lung: Secondary | ICD-10-CM | POA: Diagnosis not present

## 2021-04-24 DIAGNOSIS — Z8679 Personal history of other diseases of the circulatory system: Secondary | ICD-10-CM | POA: Diagnosis not present

## 2021-04-24 DIAGNOSIS — J22 Unspecified acute lower respiratory infection: Secondary | ICD-10-CM

## 2021-04-24 MED ORDER — CLARITHROMYCIN 500 MG PO TABS: 500.0000 mg | ORAL_TABLET | Freq: Two times a day (BID) | ORAL | 0 refills | Status: DC

## 2021-04-24 MED ORDER — PREDNISONE 10 MG PO TABS: ORAL_TABLET | ORAL | 0 refills | Status: DC

## 2021-04-24 NOTE — Progress Notes (Signed)
   Subjective:    Patient ID: Julia Solis, female    DOB: 08/15/1951, 70 y.o.   MRN: [id number]  HPI 70 year old Female seen June 28 for respiratory infection treated with Doxycycline and Hycodan,. Cough went away for a few day but then returned with some yellow sputum production.Cough is not too bad at night with Nyquil but during the day more bothersome. Bigger issue of tightness in chest in when   Going to see Pulmonologist at [facility] in November. Hx of pulmonary Sarcoidosis.  Usually get CT of the chest at that time.  Review of Systems-no new complaints.  Cough is aggravating.     Objective:   Physical Exam Blood pressure 120/80 pulse 78 regular temperature 97.8 degrees pulse oximetry 97% weight 186 pounds height 5 feet 4 inches BMI 31.93  Skin is warm and dry.  No cervical adenopathy.  Her chest is completely clear to auscultation without rales or wheezing.  Cardiac exam: Regular rate and rhythm.  No lower extremity edema.       Assessment & Plan:  Prolonged lower respiratory infection.  Plan: She will take prednisone in tapering course 10 mg tablets starting with 6 tablets day 1 and decreasing by 10 mg daily i.e. 6-5-4-3-2-1 taper.  Will take Biaxin 500 mg twice daily for 10 days.  If not improving suggest she contact pulmonologist at [facility] we did discuss chest x-ray but will hold off until the above treatment is tried.  Pulmonary sarcoidosis-has follow-up at [facility] in November.  Hold off on chest x-ray for now as her lungs are clear.   Prednisone will likely help the symptoms if sarcoidosis is contributing to these symptoms.  History of sick sinus syndrome treated with pacemaker and followed by Dr. Rayann Heman.

## 2021-05-04 NOTE — Patient Instructions (Addendum)
Take prednisone in tapering course starting with 6 x 10 mg tablets: Day 1 and decreasing by 10 mg daily i.e. 6-5-4-3-2-1 taper.  Take Biaxin 500 mg twice daily for 10 days.  If not improved, suggest follow-up with pulmonologist sooner than November.  Chest x-ray not done today because would not seem to be of much benefit in she will be due for CT of the chest in November.  CPE due here in November.

## 2021-05-15 ENCOUNTER — Ambulatory Visit (INDEPENDENT_AMBULATORY_CARE_PROVIDER_SITE_OTHER): Payer: PPO

## 2021-05-15 DIAGNOSIS — I495 Sick sinus syndrome: Secondary | ICD-10-CM | POA: Diagnosis not present

## 2021-05-18 LAB — CUP PACEART REMOTE DEVICE CHECK
Date Time Interrogation Session: 20220812064729
Implantable Lead Implant Date: 20220201
Implantable Lead Implant Date: 20220201
Implantable Lead Location: 753859
Implantable Lead Location: 753860
Implantable Pulse Generator Implant Date: 20220201
Pulse Gen Model: 2272
Pulse Gen Serial Number: 3896658

## 2021-06-02 ENCOUNTER — Other Ambulatory Visit: Payer: Self-pay | Admitting: Internal Medicine

## 2021-06-06 NOTE — Progress Notes (Signed)
Remote pacemaker transmission.   

## 2021-06-10 ENCOUNTER — Telehealth: Payer: Self-pay | Admitting: Internal Medicine

## 2021-06-10 NOTE — Telephone Encounter (Signed)
Pt called and said that she has a summons for jury duty and that based on her Sarcoidosis she was wondering if Dr Renold Genta thinks it will be okay for her to be around all those people in the court room and if not then she will need a note excusing her from it [phone]

## 2021-06-12 DIAGNOSIS — Z0289 Encounter for other administrative examinations: Secondary | ICD-10-CM

## 2021-06-12 NOTE — Telephone Encounter (Signed)
Pt dropped off Jury summons to be completed

## 2021-06-20 ENCOUNTER — Encounter: Payer: Self-pay | Admitting: Internal Medicine

## 2021-06-20 NOTE — Telephone Encounter (Signed)
Letter is complete, has been mailed to  [employer] Attention: [place], [place] [phone]  Also mailed copies to patients home.

## 2021-07-02 DIAGNOSIS — L812 Freckles: Secondary | ICD-10-CM | POA: Diagnosis not present

## 2021-07-02 DIAGNOSIS — D1801 Hemangioma of skin and subcutaneous tissue: Secondary | ICD-10-CM | POA: Diagnosis not present

## 2021-07-02 DIAGNOSIS — D2272 Melanocytic nevi of left lower limb, including hip: Secondary | ICD-10-CM | POA: Diagnosis not present

## 2021-07-02 DIAGNOSIS — D2262 Melanocytic nevi of left upper limb, including shoulder: Secondary | ICD-10-CM | POA: Diagnosis not present

## 2021-07-02 DIAGNOSIS — L304 Erythema intertrigo: Secondary | ICD-10-CM | POA: Diagnosis not present

## 2021-07-02 DIAGNOSIS — Z85828 Personal history of other malignant neoplasm of skin: Secondary | ICD-10-CM | POA: Diagnosis not present

## 2021-07-02 DIAGNOSIS — D2271 Melanocytic nevi of right lower limb, including hip: Secondary | ICD-10-CM | POA: Diagnosis not present

## 2021-07-02 DIAGNOSIS — L821 Other seborrheic keratosis: Secondary | ICD-10-CM | POA: Diagnosis not present

## 2021-07-02 DIAGNOSIS — D2261 Melanocytic nevi of right upper limb, including shoulder: Secondary | ICD-10-CM | POA: Diagnosis not present

## 2021-07-10 DIAGNOSIS — R918 Other nonspecific abnormal finding of lung field: Secondary | ICD-10-CM | POA: Diagnosis not present

## 2021-07-10 DIAGNOSIS — R599 Enlarged lymph nodes, unspecified: Secondary | ICD-10-CM | POA: Diagnosis not present

## 2021-07-14 DIAGNOSIS — D869 Sarcoidosis, unspecified: Secondary | ICD-10-CM | POA: Diagnosis not present

## 2021-07-14 DIAGNOSIS — E042 Nontoxic multinodular goiter: Secondary | ICD-10-CM | POA: Diagnosis not present

## 2021-07-14 DIAGNOSIS — I272 Pulmonary hypertension, unspecified: Secondary | ICD-10-CM | POA: Diagnosis not present

## 2021-07-14 DIAGNOSIS — H10411 Chronic giant papillary conjunctivitis, right eye: Secondary | ICD-10-CM | POA: Diagnosis not present

## 2021-07-14 DIAGNOSIS — R162 Hepatomegaly with splenomegaly, not elsewhere classified: Secondary | ICD-10-CM | POA: Diagnosis not present

## 2021-07-17 DIAGNOSIS — M79652 Pain in left thigh: Secondary | ICD-10-CM | POA: Diagnosis not present

## 2021-07-18 DIAGNOSIS — M25572 Pain in left ankle and joints of left foot: Secondary | ICD-10-CM | POA: Diagnosis not present

## 2021-07-22 DIAGNOSIS — M25552 Pain in left hip: Secondary | ICD-10-CM | POA: Diagnosis not present

## 2021-08-03 ENCOUNTER — Other Ambulatory Visit: Payer: Self-pay | Admitting: Internal Medicine

## 2021-08-12 ENCOUNTER — Other Ambulatory Visit: Payer: PPO | Admitting: Internal Medicine

## 2021-08-12 ENCOUNTER — Other Ambulatory Visit: Payer: Self-pay

## 2021-08-12 DIAGNOSIS — E7800 Pure hypercholesterolemia, unspecified: Secondary | ICD-10-CM | POA: Diagnosis not present

## 2021-08-12 DIAGNOSIS — Z1329 Encounter for screening for other suspected endocrine disorder: Secondary | ICD-10-CM

## 2021-08-12 DIAGNOSIS — R030 Elevated blood-pressure reading, without diagnosis of hypertension: Secondary | ICD-10-CM

## 2021-08-12 DIAGNOSIS — Z0000 Encounter for general adult medical examination without abnormal findings: Secondary | ICD-10-CM

## 2021-08-13 LAB — COMPLETE METABOLIC PANEL WITH GFR
AG Ratio: 1.6 (calc) (ref 1.0–2.5)
ALT: 7 U/L (ref 6–29)
AST: 13 U/L (ref 10–35)
Albumin: 4.1 g/dL (ref 3.6–5.1)
Alkaline phosphatase (APISO): 104 U/L (ref 37–153)
BUN: 21 mg/dL (ref 7–25)
CO2: 25 mmol/L (ref 20–32)
Calcium: 9.3 mg/dL (ref 8.6–10.4)
Chloride: 105 mmol/L (ref 98–110)
Creat: 0.73 mg/dL (ref 0.50–1.05)
Globulin: 2.6 g/dL (calc) (ref 1.9–3.7)
Glucose, Bld: 94 mg/dL (ref 65–99)
Potassium: 4.3 mmol/L (ref 3.5–5.3)
Sodium: 141 mmol/L (ref 135–146)
Total Bilirubin: 0.5 mg/dL (ref 0.2–1.2)
Total Protein: 6.7 g/dL (ref 6.1–8.1)
eGFR: 89 mL/min/{1.73_m2} (ref >=60)

## 2021-08-13 LAB — CBC WITH DIFFERENTIAL/PLATELET
Absolute Monocytes: 517 cells/uL (ref 200–950)
Basophils Absolute: 32 cells/uL (ref 0–200)
Basophils Relative: 0.5 %
Eosinophils Absolute: 410 cells/uL (ref 15–500)
Eosinophils Relative: 6.5 %
HCT: 38.7 % (ref 35.0–45.0)
Hemoglobin: 12.7 g/dL (ref 11.7–15.5)
Lymphs Abs: 1947 cells/uL (ref 850–3900)
MCH: 28.9 pg (ref 27.0–33.0)
MCHC: 32.8 g/dL (ref 32.0–36.0)
MCV: 88.2 fL (ref 80.0–100.0)
MPV: 10.8 fL (ref 7.5–12.5)
Monocytes Relative: 8.2 %
Neutro Abs: 3396 cells/uL (ref 1500–7800)
Neutrophils Relative %: 53.9 %
Platelets: 198 10*3/uL (ref 140–400)
RBC: 4.39 10*6/uL (ref 3.80–5.10)
RDW: 12.7 % (ref 11.0–15.0)
Total Lymphocyte: 30.9 %
WBC: 6.3 10*3/uL (ref 3.8–10.8)

## 2021-08-13 LAB — LIPID PANEL
Cholesterol: 199 mg/dL (ref <200)
HDL: 73 mg/dL (ref >=50)
LDL Cholesterol (Calc): 111 mg/dL (calc) — ABNORMAL HIGH
Non-HDL Cholesterol (Calc): 126 mg/dL (calc) (ref <130)
Total CHOL/HDL Ratio: 2.7 (calc) (ref <5.0)
Triglycerides: 60 mg/dL (ref <150)

## 2021-08-13 LAB — TSH: TSH: 2.16 mIU/L (ref 0.40–4.50)

## 2021-08-14 ENCOUNTER — Ambulatory Visit (INDEPENDENT_AMBULATORY_CARE_PROVIDER_SITE_OTHER): Payer: PPO

## 2021-08-14 DIAGNOSIS — I495 Sick sinus syndrome: Secondary | ICD-10-CM | POA: Diagnosis not present

## 2021-08-15 ENCOUNTER — Ambulatory Visit (INDEPENDENT_AMBULATORY_CARE_PROVIDER_SITE_OTHER): Payer: PPO | Admitting: Internal Medicine

## 2021-08-15 ENCOUNTER — Other Ambulatory Visit: Payer: Self-pay

## 2021-08-15 ENCOUNTER — Encounter: Payer: Self-pay | Admitting: Internal Medicine

## 2021-08-15 VITALS — BP 128/82 | HR 63 | Temp 97.7°F | Resp 16 | Ht 64.75 in | Wt 190.0 lb

## 2021-08-15 DIAGNOSIS — Z1231 Encounter for screening mammogram for malignant neoplasm of breast: Secondary | ICD-10-CM | POA: Diagnosis not present

## 2021-08-15 DIAGNOSIS — Z87442 Personal history of urinary calculi: Secondary | ICD-10-CM | POA: Diagnosis not present

## 2021-08-15 DIAGNOSIS — Z1211 Encounter for screening for malignant neoplasm of colon: Secondary | ICD-10-CM | POA: Diagnosis not present

## 2021-08-15 DIAGNOSIS — M8580 Other specified disorders of bone density and structure, unspecified site: Secondary | ICD-10-CM

## 2021-08-15 DIAGNOSIS — Z0000 Encounter for general adult medical examination without abnormal findings: Secondary | ICD-10-CM | POA: Diagnosis not present

## 2021-08-15 DIAGNOSIS — Z8543 Personal history of malignant neoplasm of ovary: Secondary | ICD-10-CM | POA: Diagnosis not present

## 2021-08-15 DIAGNOSIS — D860 Sarcoidosis of lung: Secondary | ICD-10-CM

## 2021-08-15 DIAGNOSIS — H905 Unspecified sensorineural hearing loss: Secondary | ICD-10-CM

## 2021-08-15 DIAGNOSIS — Z950 Presence of cardiac pacemaker: Secondary | ICD-10-CM

## 2021-08-15 DIAGNOSIS — E7800 Pure hypercholesterolemia, unspecified: Secondary | ICD-10-CM

## 2021-08-15 DIAGNOSIS — Z8679 Personal history of other diseases of the circulatory system: Secondary | ICD-10-CM | POA: Diagnosis not present

## 2021-08-15 LAB — CUP PACEART REMOTE DEVICE CHECK
Date Time Interrogation Session: 20221111093730
Implantable Lead Implant Date: 20220201
Implantable Lead Implant Date: 20220201
Implantable Lead Location: 753859
Implantable Lead Location: 753860
Implantable Pulse Generator Implant Date: 20220201
Pulse Gen Model: 2272
Pulse Gen Serial Number: 3896658

## 2021-08-15 MED ORDER — LOSARTAN POTASSIUM 25 MG PO TABS: 25.0000 mg | ORAL_TABLET | Freq: Every day | ORAL | 3 refills | Status: DC

## 2021-08-15 NOTE — Progress Notes (Addendum)
Annual Wellness Visit     Patient: Julia Solis, Female    DOB: 08/10/1951, 70 y.o.   MRN: [id number] Visit Date: 08/15/2021  Chief Complaint  Patient presents with   Medicare Wellness   Subjective    Julia Solis is a 70 y.o. female who presents today for her Annual Wellness Visit.  HPI she also presents for health maintenance and evaluation of medical issues.  She has a history of pulmonary sarcoidosis followed at [facility] by pulmonologist.  Reports no significant shortness of breath.  She has had extensive evaluation.  She had bone biopsy showing granulomatous inflammation.  She has known since 79s that she had an abnormal chest x-ray.  History of mild pulmonary hypertension.  Was diagnosed in 2019 after having a renal stone and CT suggested lymphadenopathy.  Had PET scan which was abnormal and was subsequently diagnosed with sarcoidosis.  She had lithotripsy for treatment of renal stone in 2019.  Does NOT currently use oxygen overnight.  Pulse oximetry generally ranges in the low 90s at night with oxygen per Pulmonary at [facility].  History of lumbar disc disease status post surgery by Dr. [name] in 1997.  Has chronic numbness left foot from that surgery.  Additional past medical history: Ectopic pregnancy 1980, benign left breast biopsy 1993.  C-sections in 1992 and 1995.  Ganglion cyst removed from wrist in 1975.  Tonsillectomy 1957.  A brow lift in 2008.  She has a history of ovarian cancer 1C grade 3 clear-cell is status post debulking surgery March 2011 by Dr. [name].  Had total abdominal hysterectomy, BSO and bilateral pelvic node dissection.  Had aortic node sampling, omentectomy, appendectomy and biopsies.  Lymph nodes were negative for malignancy.  Symptoms started February 2011 with abdominal gas and bloating.  CT of the chest at that time showed tiny nodules thought to be benign but in retrospect could have been  sarcoidosis.  Social history: Divorced and has 2 adult daughters.  Does not smoke but did smoke from the ages of 89 through 40.  Social alcohol consumption.  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  Had colonoscopy done by Dr. Earle Gell in 2012 that apparently was normal.  History of mild vitamin D deficiency.  Has had some issues with insomnia and has tried Ambien and lorazepam.    Patient Care Team: Elby Showers, MD as PCP - General (Internal Medicine) Thompson Grayer, MD as PCP - Cardiology (Cardiology)  Review of Systems see above-no new complaints doing fairly well at this time   Objective    Vitals: There were no vitals taken for this visit.  Physical Exam Skin: Warm and dry.  No cervical adenopathy.  TMs clear.  Chest clear.  Cardiac exam: Regular rate and rhythm.  Breasts are without masses.  Abdomen is soft nondistended without hepatosplenomegaly masses or tenderness.  No lower extremity pitting edema.  Neuro is intact without gross focal deficits.  Affect thought and judgment are normal.  Most recent functional status assessment: In your present state of health, do you have any difficulty performing the following activities: 08/15/2021  Hearing? N  Comment -  Vision? N  Difficulty concentrating or making decisions? N  Walking or climbing stairs? N  Dressing or bathing? N  Doing errands, shopping? N  Preparing Food and eating ? N  Using the Toilet? N  In the past six months, have you accidently leaked urine? N  Do you have problems with loss of bowel control? N  Managing your Medications? N  Managing your Finances? N  Housekeeping or managing your Housekeeping? N  Some recent data might be hidden   Most recent fall risk assessment: Fall Risk  08/15/2021  Falls in the past year? 0  Comment -  Number falls in past yr: 0  Injury with Fall? 0  Risk for fall due to : No Fall Risks  Follow up Falls evaluation completed     Most recent depression screenings: PHQ 2/9 Scores 08/15/2021 08/14/2020  PHQ - 2 Score 0 0   Most recent cognitive screening: 6CIT Screen 08/15/2021  What Year? 0 points  What month? 0 points  What time? 0 points  Count back from 20 0 points  Months in reverse 0 points  Repeat phrase 2 points  Total Score 2       Assessment & Plan     Annual wellness visit done today including the all of the following: Reviewed patient's Family Medical History Reviewed and updated list of patient's medical providers Assessment of cognitive impairment was done Assessed patient's functional ability Established a written schedule for health screening [place] Completed and Reviewed  Discussed health benefits of physical activity, and encouraged her to engage in regular exercise appropriate for her age and condition.         {I, Elby Showers, MD, have reviewed all documentation for this visit. The documentation on 08/15/21 for the exam, diagnosis, procedures, and orders are all accurate and complete.  Impression:  Pulmonary sarcoidosis followed at [facility]  History of kidney stones and patient has history of microscopic hematuria.  Underwent cystoscopy with retrograde pyelogram, ureteroscopy and stent placement in May 2022 by Dr. Milford Cage for ureteral calculus.  Seen by cardiologist for history of sick sinus syndrome and atrial tachycardia.  Has pacemaker which was placed and February 2022  Hypertension-stable  History of astigmatism and cataracts bilateral  Remote history of ovarian cancer  Pure hypercholesterolemia-is currently on Crestor 5 mg 3 times a week  Insomnia treated with Ativan as needed at bedtime  Musculoskeletal pain treated with Mobic if needed  Health maintenance-referral made for colonoscopy.  Bone density order sent to [facility].  Immunizations are up-to-date.  Plan: Return in 1 year or as needed.  Angus Seller, CMA

## 2021-08-15 NOTE — Patient Instructions (Addendum)
It was a pleasure to see you today. Referral made for colonoscopy. Immunizations are up to date. Bone density order sent to [facility].  I have refilled losartan as requested.  Return in 1 year or as needed.

## 2021-08-25 ENCOUNTER — Encounter: Payer: Self-pay | Admitting: Gastroenterology

## 2021-08-25 NOTE — Progress Notes (Signed)
Remote pacemaker transmission.   

## 2021-10-08 ENCOUNTER — Ambulatory Visit: Payer: PPO | Admitting: Gastroenterology

## 2021-10-08 ENCOUNTER — Encounter: Payer: Self-pay | Admitting: Gastroenterology

## 2021-10-08 VITALS — BP 128/64 | HR 72 | Ht 64.0 in | Wt 190.4 lb

## 2021-10-08 DIAGNOSIS — Z1211 Encounter for screening for malignant neoplasm of colon: Secondary | ICD-10-CM

## 2021-10-08 DIAGNOSIS — Z800 Family history of malignant neoplasm of digestive organs: Secondary | ICD-10-CM

## 2021-10-08 DIAGNOSIS — K5900 Constipation, unspecified: Secondary | ICD-10-CM

## 2021-10-08 DIAGNOSIS — Z8719 Personal history of other diseases of the digestive system: Secondary | ICD-10-CM | POA: Diagnosis not present

## 2021-10-08 NOTE — Patient Instructions (Signed)
You have been scheduled for a colonoscopy. Please follow written instructions given to you at your visit today.  Please pick up your prep supplies at the pharmacy within the next 1-3 days. If you use inhalers (even only as needed), please bring them with you on the day of your procedure.  If you are age 70 or older, your body mass index should be between 23-30. Your Body mass index is 32.68 kg/m. If this is out of the aforementioned range listed, please consider follow up with your Primary Care Provider.  If you are age 28 or younger, your body mass index should be between 19-25. Your Body mass index is 32.68 kg/m. If this is out of the aformentioned range listed, please consider follow up with your Primary Care Provider.   ________________________________________________________  The [facility] providers would like to encourage you to use [employer] to communicate with providers for non-urgent requests or questions.  Due to long hold times on the telephone, sending your provider a message by [facility] may be a faster and more efficient way to get a response.  Please allow 48 business hours for a response.  Please remember that this is for non-urgent requests.  _______________________________________________________  Due to recent changes in healthcare laws, you may see the results of your imaging and laboratory studies on MyChart before your provider has had a chance to review them.  We understand that in some cases there may be results that are confusing or concerning to you. Not all laboratory results come back in the same time frame and the provider may be waiting for multiple results in order to interpret others.  Please give [place] 48 hours in order for your provider to thoroughly review all the results before contacting the office for clarification of your results.

## 2021-10-08 NOTE — Progress Notes (Signed)
Referring Provider: Elby Showers, MD Primary Care Physician:  Elby Showers, MD  Reason for Consultation:  Colonoscopy   IMPRESSION:  Colonoscopy for colon cancer screening Diverticulosis reported on prior colonoscopy Family history of colon polyps (sister) Family history of colon cancer (grandmother)  Colonoscopy recommended. Records with Dr. Renold Genta mention oxygen overnight. However, she has never required supplemental oxygen.  She is an appropriate candidate for endoscopic evaluation in the [place].  High-fiber diet recommended given history of diverticulosis.  PLAN: - Colonoscopy - Obtain prior colonoscopy reports from [facility]   HPI: Julia Solis is a 71 y.o. female presents for colon cancer screening. 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    Her past medical history is significant for pulmonary sarcoidosis followed at [facility], mild pulmonary hypertension, nephrolithiasis requiring lithotripsy, lumbar disc disease, and 1C grade 3 clear-cell ovarian cancer treated with debulking surgery in 2011. Has a pacemaker placed 2022 for sick sinus syndrome.  Records from Dr. Renold Genta mention overnight oxygen use however the patient has never required oxygen supplementation.  Echocardiogram 09/16/2020 showed an EF of 60 to 65%.  She presents today for colon cancer screening.  She has had 2 prior colonoscopies at [place].  The results of those procedures are not available to me today but she is not aware of any history of polyps.  She knows about diverticulosis.  She has had altered bowel habits since the time of her ovarian cancer surgery in 2011.  Has less awareness of urgency and longer defecation since then.  Intermittent constipation.   Intermittent hemorrhoids that she treats with OTC  meds with a predominant symptom of anal irritation.  There is no blood or mucus in the stool.  Sister with colon polyps on a shorter interval  surveillance. Grandmother with colon cancer.   Past Medical History:  Diagnosis Date   Cancer [facility]) 2011   ovarian   Complication of anesthesia    GERD (gastroesophageal reflux disease)    Hearing aid worn    both ears   History of chemotherapy 2011   History of hiatal hernia    History of kidney stones    Hypertension    Neuromuscular disorder ([place])    left foot numbness since  eback surgery   PONV (postoperative nausea and vomiting)    Presence of permanent cardiac pacemaker    Sarcoidosis    in remission sees pumonary  once a year dr bellinger lov 08-21-2020 care everywhere   Sick sinus syndrome [facility])    Wears glasses     Past Surgical History:  Procedure Laterality Date   ABDOMINAL HYSTERECTOMY  2011   APPENDECTOMY  2011    ?with hysterectomy   BACK SURGERY  1997   lumbar   CESAREAN SECTION  1982, 1985   COSMETIC SURGERY  2007   brow lift   CYSTOSCOPY WITH RETROGRADE PYELOGRAM, URETEROSCOPY AND STENT PLACEMENT Left 02/04/2021   Procedure: CYSTOSCOPY WITH RETROGRADE PYELOGRAM, URETEROSCOPY AND STENT PLACEMENT;  Surgeon: Remi Haggard, MD;  Location: [facility];  Service: Urology;  Laterality: Left;  1 HR; nephrolithiasis   ECTOPIC PREGNANCY SURGERY  1980   EXTRACORPOREAL SHOCK WAVE LITHOTRIPSY Left 06/23/2018   Procedure: LEFT EXTRACORPOREAL SHOCK WAVE LITHOTRIPSY (ESWL);  Surgeon: Cleon Gustin, MD;  Location: [facility];  Service: Urology;  Laterality: Left;   GANGLION CYST EXCISION  1975   HOLMIUM LASER APPLICATION Left 38/07/1750   Procedure: HOLMIUM LASER  APPLICATION;  Surgeon: Remi Haggard, MD;  Location: [facility];  Service: Urology;  Laterality: Left;   PACEMAKER IMPLANT N/A 11/13/2020   Procedure: PACEMAKER IMPLANT;  Surgeon: Thompson Grayer, MD;  Location: [place] [facility];  Service: Cardiovascular;  Laterality: N/A;   TONSILLECTOMY  1957   adenoids removed     Current Outpatient Medications  Medication Sig  Dispense Refill   Cetirizine HCl (ZYRTEC ALLERGY) 10 MG CAPS Take 1 capsule by mouth daily.     Cholecalciferol (VITAMIN D) 50 MCG (2000 UT) tablet Take 2,000 Units by mouth daily.     diclofenac Sodium (VOLTAREN) 1 % GEL Apply 1 application topically 4 (four) times daily as needed (pain).     famotidine (PEPCID) 10 MG tablet Take 10 mg by mouth daily as needed for heartburn or indigestion.     ibuprofen (ADVIL) 200 MG tablet Take 400 mg by mouth every 6 (six) hours as needed for headache or moderate pain.     LORazepam (ATIVAN) 1 MG tablet TAKE 1 TABLET BY MOUTH EVERY DAY AT BEDTIME AS NEEDED 90 tablet 1   losartan (COZAAR) 25 MG tablet Take 1 tablet (25 mg total) by mouth daily. 90 tablet 3   meloxicam (MOBIC) 15 MG tablet TAKE 1 TABLET BY MOUTH EVERY DAY (Patient taking differently: Take 15 mg by mouth as needed.) 90 tablet 0   Polyethyl Glycol-Propyl Glycol (SYSTANE) 0.4-0.3 % GEL ophthalmic gel Place 1 application into both eyes in the morning and at bedtime.     polyethylene glycol (MIRALAX / GLYCOLAX) packet Take 17 g by mouth daily as needed for moderate constipation.     rosuvastatin (CRESTOR) 5 MG tablet TAKE 1 TABLET BY MOUTH 3 TIMES A WEEK WITH SUPPER (Patient taking differently: TAKE 1 TABLET BY MOUTH 3 TIMES WEEKLY) 36 tablet 2   No current facility-administered medications for this visit.    Allergies as of 10/08/2021 - Review Complete 10/08/2021  Allergen Reaction Noted   Flomax [tamsulosin hcl] Other (See Comments) 06/17/2018   Latex Other (See Comments)    Tramadol Other (See Comments) 02/05/2021   Ciprofloxacin Rash     Family History  Problem Relation Age of Onset   Hypertension Mother    Prostate cancer Father 3   Cancer Father    Hypertension Father    Stroke Father    Colon polyps Sister    Colon cancer Maternal Grandmother        dx in her 59s   Heart disease Maternal Grandfather    Colon cancer Paternal Grandmother    Heart disease Paternal Grandfather     Breast cancer Paternal Aunt 80   Colon cancer Paternal Aunt        dx in her 17s   Melanoma Cousin 78   Breast cancer Cousin 30       multiple cousins   Colon polyps Cousin    Breast cancer Cousin 21   Breast cancer Cousin        father's maternal 1st cousin   Breast cancer Cousin        Father's first cousin's daughter   Cancer Other        maternal great uncle with Cancer - NOS   Cancer Other        maternal great grandmother with either colon or stomach cancer   Breast cancer Other        fathers maternal aunt       Review of Systems: 23  system ROS is negative except as noted above with the addition of allergies, back pain, hearing problems requiring hearing aids, insomnia, and voice changes.   Physical Exam: General:   Alert,  well-nourished, pleasant and cooperative in NAD Head:  Normocephalic and atraumatic. Eyes:  Sclera clear, no icterus.   Conjunctiva pink. Ears:  Normal auditory acuity. Nose:  No deformity, discharge,  or lesions. Mouth:  No deformity or lesions.   Neck:  Supple; no masses or thyromegaly. Lungs:  Clear throughout to auscultation.   No wheezes. Heart:  Regular rate and rhythm; no murmurs. Abdomen:  Soft, nontender, nondistended, normal bowel sounds, no rebound or guarding. No hepatosplenomegaly.   Rectal:  Deferred  Msk:  Symmetrical. No boney deformities LAD: No inguinal or umbilical LAD Extremities:  No clubbing or edema. Neurologic:  Alert and  oriented x4;  grossly nonfocal Skin:  Intact without significant lesions or rashes. Psych:  Alert and cooperative. Normal mood and affect.     [place] Mages L. Tarri Glenn, MD, MPH 10/08/2021, 2:24 PM

## 2021-10-10 ENCOUNTER — Encounter: Payer: Self-pay | Admitting: Internal Medicine

## 2021-10-13 DIAGNOSIS — R3121 Asymptomatic microscopic hematuria: Secondary | ICD-10-CM | POA: Diagnosis not present

## 2021-10-13 DIAGNOSIS — N130 Hydronephrosis with ureteropelvic junction obstruction: Secondary | ICD-10-CM | POA: Diagnosis not present

## 2021-10-23 DIAGNOSIS — N130 Hydronephrosis with ureteropelvic junction obstruction: Secondary | ICD-10-CM | POA: Diagnosis not present

## 2021-10-23 DIAGNOSIS — R3121 Asymptomatic microscopic hematuria: Secondary | ICD-10-CM | POA: Diagnosis not present

## 2021-10-27 ENCOUNTER — Telehealth: Payer: Self-pay

## 2021-10-27 NOTE — Telephone Encounter (Signed)
Patient called wanting an appt for tomorrow. She states that she has been congested off and on for months, has a cough and her ears feel stopped up. She has taken several covid tests all negative the last being today. Please advise.

## 2021-10-27 NOTE — Telephone Encounter (Signed)
scheduled

## 2021-10-28 ENCOUNTER — Other Ambulatory Visit: Payer: Self-pay

## 2021-10-28 ENCOUNTER — Ambulatory Visit (INDEPENDENT_AMBULATORY_CARE_PROVIDER_SITE_OTHER): Payer: PPO | Admitting: Internal Medicine

## 2021-10-28 VITALS — BP 122/68 | HR 63 | Temp 98.1°F | Wt 187.8 lb

## 2021-10-28 DIAGNOSIS — Z87442 Personal history of urinary calculi: Secondary | ICD-10-CM | POA: Diagnosis not present

## 2021-10-28 DIAGNOSIS — D860 Sarcoidosis of lung: Secondary | ICD-10-CM

## 2021-10-28 DIAGNOSIS — Z950 Presence of cardiac pacemaker: Secondary | ICD-10-CM | POA: Diagnosis not present

## 2021-10-28 DIAGNOSIS — Z8679 Personal history of other diseases of the circulatory system: Secondary | ICD-10-CM | POA: Diagnosis not present

## 2021-10-28 DIAGNOSIS — J22 Unspecified acute lower respiratory infection: Secondary | ICD-10-CM | POA: Diagnosis not present

## 2021-10-28 MED ORDER — METHYLPREDNISOLONE 4 MG PO TBPK: ORAL_TABLET | ORAL | 0 refills | Status: DC

## 2021-10-28 MED ORDER — DOXYCYCLINE HYCLATE 100 MG PO TABS: 100.0000 mg | ORAL_TABLET | Freq: Two times a day (BID) | ORAL | 0 refills | Status: DC

## 2021-10-28 NOTE — Progress Notes (Signed)
° °  Subjective:    Patient ID: Julia Solis, female    DOB: 1951/04/21, 71 y.o.   MRN: [id number]  HPI Has had head congestion since the Fall.No chest congestion. Now has chest congestion and post nasal drip. No discolored sputum. No fever or chills.  History of pulmonary sarcoidosis followed at [facility].  Has grandchildren but has not been around anyone with an acute illness recently.  History of numerous COVID vaccines the last 1 in September 2022.  Recent COVID testing has been negative.  Has had Prevnar 13 and pneumococcal 23 vaccines.  History of sick sinus syndrome treated with pacemaker and followed by Cardiology  History of kidney stones and had lithotripsy for 6 mm distal ureteral calculi and 8 mm proximal left ureteral calculus in May 2022 by Dr. Milford Cage.    Review of Systems no sore throat. No fever. Hearing is worse.Has upcoming apt with urologist     Objective:   Physical Exam Vital signs are reviewed.  She is afebrile TMs are clear.  Pharynx is clear.  Neck is supple.  No adenopathy in her neck.  Chest is clear to auscultation bilaterally without rales or wheezing.  She is not tachypneic.      Assessment & Plan:  Acute lower respiratory infection  Plan: She will be treated with doxycycline 100 mg twice daily for 10 days.  She will take Medrol 4 mg Dosepak in tapering course as directed starting with 6 tablets day 1 and decreasing by 1 tablet daily i.e. 6-5-4-3-2-1 taper.  She will let me know if not better in 10 days to 2 weeks.

## 2021-10-28 NOTE — Patient Instructions (Addendum)
Take Medrol as directed 6-5-4-3-2-1 and Doxycycline 100 mg twice daily for 10 days.

## 2021-10-29 ENCOUNTER — Encounter: Payer: Self-pay | Admitting: Internal Medicine

## 2021-11-03 ENCOUNTER — Telehealth: Payer: Self-pay | Admitting: Gastroenterology

## 2021-11-03 NOTE — Telephone Encounter (Signed)
Patient called to let the office know that she went to see her doctor for a respiratory infection and she was given Doxycycline 100 mg twice a day for 10 days almost done and the other is Medrol Pak 4 mg for 6 days.   This will be finished by this week her schedule procedure is 11/13/21. Please advise if this is ok her preferred contact number is her mobile.

## 2021-11-04 NOTE — Telephone Encounter (Signed)
Left message for patient that she should be fine to go forward with procedure as long as she is getting better and does not have a fever. Advised to call back with any questions.

## 2021-11-05 DIAGNOSIS — Z1231 Encounter for screening mammogram for malignant neoplasm of breast: Secondary | ICD-10-CM | POA: Diagnosis not present

## 2021-11-05 DIAGNOSIS — M85852 Other specified disorders of bone density and structure, left thigh: Secondary | ICD-10-CM | POA: Diagnosis not present

## 2021-11-05 DIAGNOSIS — M85851 Other specified disorders of bone density and structure, right thigh: Secondary | ICD-10-CM | POA: Diagnosis not present

## 2021-11-05 DIAGNOSIS — M810 Age-related osteoporosis without current pathological fracture: Secondary | ICD-10-CM | POA: Diagnosis not present

## 2021-11-05 DIAGNOSIS — Z780 Asymptomatic menopausal state: Secondary | ICD-10-CM | POA: Diagnosis not present

## 2021-11-13 ENCOUNTER — Ambulatory Visit (AMBULATORY_SURGERY_CENTER): Payer: PPO | Admitting: Gastroenterology

## 2021-11-13 ENCOUNTER — Other Ambulatory Visit: Payer: Self-pay

## 2021-11-13 ENCOUNTER — Ambulatory Visit (INDEPENDENT_AMBULATORY_CARE_PROVIDER_SITE_OTHER): Payer: PPO

## 2021-11-13 ENCOUNTER — Encounter: Payer: Self-pay | Admitting: Gastroenterology

## 2021-11-13 VITALS — BP 160/80 | HR 68 | Temp 95.3°F | Resp 18 | Ht 64.0 in | Wt 190.0 lb

## 2021-11-13 DIAGNOSIS — D122 Benign neoplasm of ascending colon: Secondary | ICD-10-CM | POA: Diagnosis not present

## 2021-11-13 DIAGNOSIS — Z800 Family history of malignant neoplasm of digestive organs: Secondary | ICD-10-CM

## 2021-11-13 DIAGNOSIS — I495 Sick sinus syndrome: Secondary | ICD-10-CM

## 2021-11-13 DIAGNOSIS — Z1211 Encounter for screening for malignant neoplasm of colon: Secondary | ICD-10-CM | POA: Diagnosis not present

## 2021-11-13 DIAGNOSIS — Z8371 Family history of colonic polyps: Secondary | ICD-10-CM

## 2021-11-13 DIAGNOSIS — K219 Gastro-esophageal reflux disease without esophagitis: Secondary | ICD-10-CM | POA: Diagnosis not present

## 2021-11-13 DIAGNOSIS — I10 Essential (primary) hypertension: Secondary | ICD-10-CM | POA: Diagnosis not present

## 2021-11-13 LAB — CUP PACEART REMOTE DEVICE CHECK
Date Time Interrogation Session: 20230209110359
Implantable Lead Implant Date: 20220201
Implantable Lead Implant Date: 20220201
Implantable Lead Location: 753859
Implantable Lead Location: 753860
Implantable Pulse Generator Implant Date: 20220201
Pulse Gen Model: 2272
Pulse Gen Serial Number: 3896658

## 2021-11-13 MED ORDER — SODIUM CHLORIDE 0.9 % IV SOLN: 500.0000 mL | Freq: Once | INTRAVENOUS | Status: DC

## 2021-11-13 NOTE — Progress Notes (Signed)
Called to room to assist during endoscopic procedure.  Patient ID and intended procedure confirmed with present staff. Received instructions for my participation in the procedure from the performing physician.  

## 2021-11-13 NOTE — Progress Notes (Signed)
Patient with upper abdomen gas pains, Leysin 0.25 mg and Simethicon with minimal relief.  Patient walked in unit. Describes discomfort as 4/10. Patient will notify us if gas pain does not improve.

## 2021-11-13 NOTE — Op Note (Signed)
[facility] Patient Name: Julia Solis Procedure Date: 11/13/2021 10:50 AM MRN: [id number] Endoscopist: Thornton Park MD, MD Age: 71 Referring MD:  Date of Birth: [birth year] Gender: Female Account #: [id number] Procedure:                Colonoscopy Indications:              Screening for colorectal malignant neoplasm                           Two prior colonoscopies with Dr. Wynetta Emery - last >10                            years ago                           Sister with colon polyp                           Maternal grandmother with colon cancer in her 10s Medicines:                Monitored Anesthesia Care Procedure:                Pre-Anesthesia Assessment:                           - Prior to the procedure, a History and Physical                            was performed, and patient medications and                            allergies were reviewed. The patient's tolerance of                            previous anesthesia was also reviewed. The risks                            and benefits of the procedure and the sedation                            options and risks were discussed with the patient.                            All questions were answered, and informed consent                            was obtained. Prior Anticoagulants: The patient has                            taken no previous anticoagulant or antiplatelet                            agents. ASA Grade Assessment: II - A patient with  mild systemic disease. After reviewing the risks                            and benefits, the patient was deemed in                            satisfactory condition to undergo the procedure.                           After obtaining informed consent, the colonoscope                            was passed under direct vision. Throughout the                            procedure, the patient's blood pressure, pulse, and                             oxygen saturations were monitored continuously. The                            CF [id number] #[id number] was introduced through the anus                            and advanced to the the cecum, identified by                            appendiceal orifice and ileocecal valve. A second                            forward view of the right colon was performed. The                            colonoscopy was performed without difficulty. The                            patient tolerated the procedure well. The quality                            of the bowel preparation was good. The ileocecal                            valve, appendiceal orifice, and rectum were                            photographed. Scope In: 11:32:42 AM Scope Out: 11:47:14 AM Scope Withdrawal Time: 0 hours 11 minutes 9 seconds  Total Procedure Duration: 0 hours 14 minutes 32 seconds  Findings:                 The perianal and digital rectal examinations were                            normal.  Non-bleeding internal hemorrhoids were found.                           A few small-mouthed diverticula were found in the                            sigmoid colon.                           Four sessile polyps were found in the ascending                            colon. The polyps were 1 to 3 mm in size. These                            polyps were removed with a cold snare. Resection                            and retrieval were complete. Estimated blood loss                            was minimal.                           The exam was otherwise without abnormality on                            direct and retroflexion views. Complications:            No immediate complications. Estimated blood loss:                            Minimal. Estimated Blood Loss:     Estimated blood loss was minimal. Impression:               - Non-bleeding internal hemorrhoids.                           - Diverticulosis in the sigmoid  colon.                           - Four 1 to 3 mm polyps in the ascending colon,                            removed with a cold snare. Resected and retrieved.                           - The examination was otherwise normal on direct                            and retroflexion views. Recommendation:           - Patient has a contact number available for                            emergencies. The signs and symptoms of  potential                            delayed complications were discussed with the                            patient. Return to normal activities tomorrow.                            Written discharge instructions were provided to the                            patient.                           - High fiber diet.                           - Continue present medications.                           - Await pathology results. Repeat colonoscopy in 3                            years if at least 3 polyps are adenomas.                           - Repeat colonoscopy date to be determined after                            pending pathology results are reviewed for                            surveillance.                           - Emerging evidence supports eating a diet of                            fruits, vegetables, grains, calcium, and yogurt                            while reducing red meat and alcohol may reduce the                            risk of colon cancer.                           - Thank you for allowing me to be involved in your                            colon cancer prevention. Thornton Park MD, MD 11/13/2021 11:52:01 AM This report has been signed electronically.

## 2021-11-13 NOTE — Progress Notes (Signed)
Referring Provider: Elby Showers, MD Primary Care Physician:  Elby Showers, MD  Reason for Procedure:  Colon cancer surveillance   IMPRESSION:  Need for colon cancer surveillance Appropriate candidate for monitored anesthesia care  PLAN: Colonoscopy in the [facility] today   HPI: Julia Solis is a 71 y.o. female presents for surveillance colonoscopy.  Normal screening colonoscopy with Dr. Earle Gell 06/05/2011.  No baseline GI symptoms.   Sister with colon polyps. Maternal grandmother with colon cancer in her 2s. Mother in her 53s with no GI history. No other known family history of colon cancer or polyps. No family history of uterine/endometrial cancer, pancreatic cancer or gastric/stomach cancer.   Past Medical History:  Diagnosis Date   Cancer [facility]) 2011   ovarian   Complication of anesthesia    GERD (gastroesophageal reflux disease)    Hearing aid worn    both ears   History of chemotherapy 2011   History of hiatal hernia    History of kidney stones    Hypertension    Neuromuscular disorder ([place])    left foot numbness since  eback surgery   PONV (postoperative nausea and vomiting)    Presence of permanent cardiac pacemaker    Sarcoidosis    in remission sees pumonary  once a year dr bellinger lov 08-21-2020 care everywhere   Sick sinus syndrome [facility])    Wears glasses     Past Surgical History:  Procedure Laterality Date   ABDOMINAL HYSTERECTOMY  2011   APPENDECTOMY  2011    ?with hysterectomy   BACK SURGERY  1997   lumbar   CESAREAN SECTION  1982, 1985   COSMETIC SURGERY  2007   brow lift   CYSTOSCOPY WITH RETROGRADE PYELOGRAM, URETEROSCOPY AND STENT PLACEMENT Left 02/04/2021   Procedure: CYSTOSCOPY WITH RETROGRADE PYELOGRAM, URETEROSCOPY AND STENT PLACEMENT;  Surgeon: Remi Haggard, MD;  Location: [facility];  Service: Urology;  Laterality: Left;  1 HR; nephrolithiasis   ECTOPIC PREGNANCY SURGERY  1980   EXTRACORPOREAL SHOCK  WAVE LITHOTRIPSY Left 06/23/2018   Procedure: LEFT EXTRACORPOREAL SHOCK WAVE LITHOTRIPSY (ESWL);  Surgeon: Cleon Gustin, MD;  Location: [facility];  Service: Urology;  Laterality: Left;   GANGLION CYST EXCISION  1975   HOLMIUM LASER APPLICATION Left 15/17/6160   Procedure: HOLMIUM LASER APPLICATION;  Surgeon: Remi Haggard, MD;  Location: [facility];  Service: Urology;  Laterality: Left;   PACEMAKER IMPLANT N/A 11/13/2020   Procedure: PACEMAKER IMPLANT;  Surgeon: Thompson Grayer, MD;  Location: [facility];  Service: Cardiovascular;  Laterality: N/A;   TONSILLECTOMY  1957   adenoids removed    Current Outpatient Medications  Medication Sig Dispense Refill   Cholecalciferol (VITAMIN D) 50 MCG (2000 UT) tablet Take 2,000 Units by mouth daily.     diclofenac Sodium (VOLTAREN) 1 % GEL Apply 1 application topically 4 (four) times daily as needed (pain).     doxycycline (VIBRA-TABS) 100 MG tablet Take 1 tablet (100 mg total) by mouth 2 (two) times daily. 20 tablet 0   famotidine (PEPCID) 10 MG tablet Take 10 mg by mouth daily as needed for heartburn or indigestion.     ibuprofen (ADVIL) 200 MG tablet Take 400 mg by mouth every 6 (six) hours as needed for headache or moderate pain.     LORazepam (ATIVAN) 1 MG tablet TAKE 1 TABLET BY MOUTH EVERY DAY AT BEDTIME AS NEEDED 90 tablet 1   losartan (COZAAR) 25  MG tablet Take 1 tablet (25 mg total) by mouth daily. 90 tablet 3   meloxicam (MOBIC) 15 MG tablet TAKE 1 TABLET BY MOUTH EVERY DAY (Patient taking differently: Take 15 mg by mouth as needed.) 90 tablet 0   methylPREDNISolone (MEDROL DOSEPAK) 4 MG TBPK tablet Take in tapering course as directed 6-5-4-3-2-1 21 tablet 0   Polyethyl Glycol-Propyl Glycol (SYSTANE) 0.4-0.3 % GEL ophthalmic gel Place 1 application into both eyes in the morning and at bedtime.     polyethylene glycol (MIRALAX / GLYCOLAX) packet Take 17 g by mouth daily as needed for moderate constipation.      rosuvastatin (CRESTOR) 5 MG tablet TAKE 1 TABLET BY MOUTH 3 TIMES A WEEK WITH SUPPER (Patient taking differently: TAKE 1 TABLET BY MOUTH 3 TIMES WEEKLY) 36 tablet 2   No current facility-administered medications for this visit.    Allergies as of 11/13/2021 - Review Complete 10/28/2021  Allergen Reaction Noted   Flomax [tamsulosin hcl] Other (See Comments) 06/17/2018   Latex Other (See Comments)    Tramadol Other (See Comments) 02/05/2021   Ciprofloxacin Rash     Family History  Problem Relation Age of Onset   Hypertension Mother    Prostate cancer Father 61   Cancer Father    Hypertension Father    Stroke Father    Colon polyps Sister    Colon cancer Maternal Grandmother        dx in her 9s   Heart disease Maternal Grandfather    Colon cancer Paternal Grandmother    Heart disease Paternal Grandfather    Breast cancer Paternal Aunt 83   Colon cancer Paternal Aunt        dx in her 51s   Melanoma Cousin 49   Breast cancer Cousin 84       multiple cousins   Colon polyps Cousin    Breast cancer Cousin 36   Breast cancer Cousin        father's maternal 1st cousin   Breast cancer Cousin        Father's first cousin's daughter   Cancer Other        maternal great uncle with Cancer - NOS   Cancer Other        maternal great grandmother with either colon or stomach cancer   Breast cancer Other        fathers maternal aunt     Physical Exam: General:   Alert,  well-nourished, pleasant and cooperative in NAD Head:  Normocephalic and atraumatic. Eyes:  Sclera clear, no icterus.   Conjunctiva pink. Mouth:  No deformity or lesions.   Neck:  Supple; no masses or thyromegaly. Lungs:  Clear throughout to auscultation.   No wheezes. Heart:  Regular rate and rhythm; no murmurs. Abdomen:  Soft, non-tender, nondistended, normal bowel sounds, no rebound or guarding.  Msk:  Symmetrical. No boney deformities LAD: No inguinal or umbilical LAD Extremities:  No clubbing or  edema. Neurologic:  Alert and  oriented x4;  grossly nonfocal Skin:  No obvious rash or bruise. Psych:  Alert and cooperative. Normal mood and affect.     Studies/Results: No results found.    Melroy Bougher L. Tarri Glenn, MD, MPH 11/13/2021, 10:49 AM

## 2021-11-13 NOTE — Patient Instructions (Signed)
Thank you for allowing [place] to care for you today! Await pathology results of polyps removed.  Will make recommendation at that time for future colonoscopy. Resume previous diet and medications today. Return to normal daily activities tomorrow, 11/14/21.   YOU HAD AN ENDOSCOPIC PROCEDURE TODAY AT [facility]:   Refer to the procedure report that was given to you for any specific questions about what was found during the examination.  If the procedure report does not answer your questions, please call your gastroenterologist to clarify.  If you requested that your care partner not be given the details of your procedure findings, then the procedure report has been included in a sealed envelope for you to review at your convenience later.  YOU SHOULD EXPECT: Some feelings of bloating in the abdomen. Passage of more gas than usual.  Walking can help get rid of the air that was put into your GI tract during the procedure and reduce the bloating. If you had a lower endoscopy (such as a colonoscopy or flexible sigmoidoscopy) you may notice spotting of blood in your stool or on the toilet paper. If you underwent a bowel prep for your procedure, you may not have a normal bowel movement for a few days.  Please Note:  You might notice some irritation and congestion in your nose or some drainage.  This is from the oxygen used during your procedure.  There is no need for concern and it should clear up in a day or so.  SYMPTOMS TO REPORT IMMEDIATELY:  Following lower endoscopy (colonoscopy or flexible sigmoidoscopy):  Excessive amounts of blood in the stool  Significant tenderness or worsening of abdominal pains  Swelling of the abdomen that is new, acute  Fever of 100F or higher    For urgent or emergent issues, a gastroenterologist can be reached at any hour by calling [phone]. Do not use MyChart messaging for urgent concerns.    DIET:  We do recommend a small meal at first, but  then you may proceed to your regular diet.  Drink plenty of fluids but you should avoid alcoholic beverages for 24 hours.  ACTIVITY:  You should plan to take it easy for the rest of today and you should NOT DRIVE or use heavy machinery until tomorrow (because of the sedation medicines used during the test).    FOLLOW UP: Our staff will call the number listed on your records 48-72 hours following your procedure to check on you and address any questions or concerns that you may have regarding the information given to you following your procedure. If we do not reach you, we will leave a message.  We will attempt to reach you two times.  During this call, we will ask if you have developed any symptoms of COVID 19. If you develop any symptoms (ie: fever, flu-like symptoms, shortness of breath, cough etc.) before then, please call [phone].  If you test positive for Covid 19 in the 2 weeks post procedure, please call and report this information to [place].    If any biopsies were taken you will be contacted by phone or by letter within the next 1-3 weeks.  Please call us at [phone] if you have not heard about the biopsies in 3 weeks.    SIGNATURES/CONFIDENTIALITY: You and/or your care partner have signed paperwork which will be entered into your electronic medical record.  These signatures attest to the fact that that the information above on your After Visit  Summary has been reviewed and is understood.  Full responsibility of the confidentiality of this discharge information lies with you and/or your care-partner.

## 2021-11-13 NOTE — Progress Notes (Signed)
Pt's states no medical or surgical changes since previsit or office visit. 

## 2021-11-13 NOTE — Progress Notes (Signed)
Report to PACU, RN, vss, BBS= Clear.  

## 2021-11-17 ENCOUNTER — Telehealth: Payer: Self-pay

## 2021-11-17 NOTE — Telephone Encounter (Signed)
°  Follow up Call-  Call back number 11/13/2021  Post procedure Call Back phone  # [phone]  Permission to leave phone message Yes  Some recent data might be hidden     Patient questions:  Do you have a fever, pain , or abdominal swelling? No. Pain Score  0 *  Have you tolerated food without any problems? Yes.    Have you been able to return to your normal activities? Yes.    Do you have any questions about your discharge instructions: Diet   No. Medications  No. Follow up visit  No.  Do you have questions or concerns about your Care? Yes.     States she has not had a bm yet, feeling a little gassy, encouraged to drink at least 64 oz of water and if desired, take one capful miralax daily or bid until produced a bm and to call office if any other questions or concerns.  Pt verb understanding.  Actions: * If pain score is 4 or above: No action needed, pain <4.  Have you developed a fever since your procedure? no  2.   Have you had an respiratory symptoms (SOB or cough) since your procedure? no  3.   Have you tested positive for COVID 19 since your procedure no  4.   Have you had any family members/close contacts diagnosed with the COVID 19 since your procedure?  no   If yes to any of these questions please route to Joylene John, RN and Joella Prince, RN

## 2021-11-18 NOTE — Progress Notes (Signed)
Remote pacemaker transmission.   

## 2021-11-24 ENCOUNTER — Telehealth (INDEPENDENT_AMBULATORY_CARE_PROVIDER_SITE_OTHER): Payer: PPO | Admitting: Internal Medicine

## 2021-11-24 ENCOUNTER — Encounter: Payer: Self-pay | Admitting: Internal Medicine

## 2021-11-24 ENCOUNTER — Telehealth: Payer: Self-pay

## 2021-11-24 DIAGNOSIS — M810 Age-related osteoporosis without current pathological fracture: Secondary | ICD-10-CM | POA: Diagnosis not present

## 2021-11-24 DIAGNOSIS — D860 Sarcoidosis of lung: Secondary | ICD-10-CM

## 2021-11-24 DIAGNOSIS — J040 Acute laryngitis: Secondary | ICD-10-CM

## 2021-11-24 NOTE — Telephone Encounter (Signed)
Patient called. She has lost her voice. Her ears ae stopped up and she has a lot of congestion. This has been going on for 2 day. Covid test yesterday and today were both negative. Asking for a virtual visit if possible.

## 2021-11-24 NOTE — Patient Instructions (Addendum)
After interviewing patient and shared medical decision making, we have agreed to see if laryngitis will resolve without antibiotic treatment.  With regard to recent diagnosis of osteoporosis based on bone density study, I am recommending the patient see Endocrinologist for consultation regarding treatment.  Patient will also check with her Pulmonologist about this as well.  She has a history of sarcoidosis.

## 2021-11-24 NOTE — Progress Notes (Addendum)
° °  Subjective:    Patient ID: Julia Solis, female    DOB: [birth year], 71 y.o.   MRN: [id number]  HPI 71 year old Female seen today via interactive audio and video telecommunications due to the Coronavirus pandemic with an acute respiratory infection and laryngitis.  Patient is agreeable to visit in this format today.  She is at her home and I am at my office.  She is identified using 2 identifiers as Julia Solis, a patient in this practice.  Patient frequently visits with grandchildren.  She is not aware that any of them have been ill recently but she has suddenly developed laryngitis.  Has no sore throat fever or chills.  She has a history of Sarcoidosis and is followed by pulmonologist at [facility].  She recently had bone density study at [facility].  I do not see a copy of this report in EPIC but we were able to have the report faxed to this office and she has T score of -2.6 in the LS spine which is increased from -2.4 in 2020.  She is asking about treatment with Prolia.  She will be referred to Endocrinology for evaluation and discussion of various treatment options.  She will also check with Pulmonologist about this new finding.  Has had numerous COVID vaccines.  Has had Prevnar 13 and pneumococcal 23 vaccines.  History of sick sinus syndrome treated with pacemaker and followed by cardiology.  History of kidney stones and had lithotripsy for 6 mm distal ureteral calculi and 8 mm proximal left ureteral calculus in May 2022.  Was seen in late January with respiratory infection.  Was treated with doxycycline and Medrol Dosepak.    Review of Systems no tachypnea or wheezing     Objective:   Physical Exam Reports no fever.  Seen virtually and is very hoarse.  Not heard to be coughing on the interview.       Assessment & Plan:  Laryngitis-likely viral.  No antibiotics prescribed.  Continue to monitor.  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  I would like for her to be seen by Endocrinologist for consultation regarding Prolia therapy which she is interested in.  She will also need to check with her pulmonologist and let them know about this new finding of osteoporosis.  Plan: No antibiotics prescribed at this time.  She will see if laryngitis will resolve it is is likely viral.  She will speak with her pulmonologist regarding new finding of osteoporosis and will be referred to endocrinologist for consultation regarding possible Prolia therapy.  Time spent with virtual visit, review of bone density study, chart review, and medical decision making is 30 minutes.

## 2021-11-25 ENCOUNTER — Encounter: Payer: Self-pay | Admitting: Gastroenterology

## 2021-11-27 ENCOUNTER — Encounter: Payer: Self-pay | Admitting: Internal Medicine

## 2021-11-27 ENCOUNTER — Other Ambulatory Visit: Payer: Self-pay

## 2021-11-27 ENCOUNTER — Ambulatory Visit (INDEPENDENT_AMBULATORY_CARE_PROVIDER_SITE_OTHER): Payer: PPO | Admitting: Internal Medicine

## 2021-11-27 VITALS — BP 138/80 | HR 81 | Temp 98.2°F

## 2021-11-27 DIAGNOSIS — J029 Acute pharyngitis, unspecified: Secondary | ICD-10-CM | POA: Diagnosis not present

## 2021-11-27 DIAGNOSIS — D869 Sarcoidosis, unspecified: Secondary | ICD-10-CM

## 2021-11-27 DIAGNOSIS — J22 Unspecified acute lower respiratory infection: Secondary | ICD-10-CM

## 2021-11-27 LAB — POCT RAPID STREP A (OFFICE): Rapid Strep A Screen: NEGATIVE

## 2021-11-27 MED ORDER — DOXYCYCLINE HYCLATE 100 MG PO TABS: 100.0000 mg | ORAL_TABLET | Freq: Two times a day (BID) | ORAL | 0 refills | Status: DC

## 2021-11-27 NOTE — Patient Instructions (Addendum)
You have been diagnosed with acute lower respiratory infection.  Take doxycycline 100 mg twice daily for 10 days.  Rest and drink fluids.  Strep screen is negative.

## 2021-11-27 NOTE — Telephone Encounter (Signed)
scheduled

## 2021-11-27 NOTE — Telephone Encounter (Signed)
Julia Solis called to say her mucus is now a brownish color and she is not much better.

## 2021-11-27 NOTE — Progress Notes (Signed)
° °  Subjective:    Patient ID: Julia Solis, female    DOB: 12-Dec-1950, 71 y.o.   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  Also complaining of a sore throat.  Patient was seen by interactive audio and video telecommunications on February 20.  At that time had no sore throat, fever or chills.  Was complaining of acute respiratory infection symptoms and laryngitis.  History of pulmonary sarcoidosis and followed at [facility].  At the time of her virtual visit it was noted she had laryngitis and that was thought to be viral.  No antibiotics were prescribed.  She was advised to call if symptoms did not improve.  She also is being referred to Endocrinologist for consultation regarding possible Prolia therapy as she has been found to have osteoporosis on the spine based on recent bone density study at [facility].    Review of Systems complaining of yellow to brownish sputum production and cough.      Objective:   Physical Exam Temperature 98.2 degrees blood pressure 138/80 pulse 81 pulse oximetry 98%  Skin: Warm and dry.  Pharynx slightly injected.  Rapid strep screen is negative.  TMs are slightly full.  Neck supple.  Chest clear without rales or wheezing.       Assessment & Plan:  Acute lower respiratory infection  History of pulmonary sarcoidosis  Sore throat-rapid strep screen is negative  Plan: She will be treated with doxycycline 100 mg twice daily for 10 days.  Rest and drink fluids and call if symptoms not improving.  I do not think she has pneumonia.  I think she has an acute lower respiratory infection.

## 2021-11-30 ENCOUNTER — Encounter: Payer: Self-pay | Admitting: Internal Medicine

## 2021-12-04 ENCOUNTER — Telehealth: Payer: Self-pay | Admitting: Internal Medicine

## 2021-12-04 NOTE — Telephone Encounter (Signed)
Patient is requesting an order to have an echocardiogram prior to seeing her pulmonologist in October, 2023. States pulmonologist would like for patient to have an echo due to sarcoidosis diagnosis. ?

## 2021-12-05 NOTE — Telephone Encounter (Signed)
RU will evaluate at f/u appt in May. ? ?Pt aware. ?

## 2021-12-16 DIAGNOSIS — R059 Cough, unspecified: Secondary | ICD-10-CM | POA: Diagnosis not present

## 2021-12-16 DIAGNOSIS — D860 Sarcoidosis of lung: Secondary | ICD-10-CM | POA: Diagnosis not present

## 2021-12-16 DIAGNOSIS — K219 Gastro-esophageal reflux disease without esophagitis: Secondary | ICD-10-CM | POA: Diagnosis not present

## 2021-12-16 DIAGNOSIS — J3089 Other allergic rhinitis: Secondary | ICD-10-CM | POA: Diagnosis not present

## 2021-12-23 ENCOUNTER — Other Ambulatory Visit: Payer: Self-pay | Admitting: Internal Medicine

## 2021-12-24 DIAGNOSIS — H2513 Age-related nuclear cataract, bilateral: Secondary | ICD-10-CM | POA: Diagnosis not present

## 2021-12-24 DIAGNOSIS — H52203 Unspecified astigmatism, bilateral: Secondary | ICD-10-CM | POA: Diagnosis not present

## 2022-01-22 IMAGING — CR DG CHEST 2V
2 series · 2 of 2 positions shown · non-contrast
Comparison: 11/02/2016

CLINICAL DATA: Status post pacemaker insertion, protocol evaluation
for pneumothorax

EXAM:
CHEST - 2 VIEW

[w chest pa]
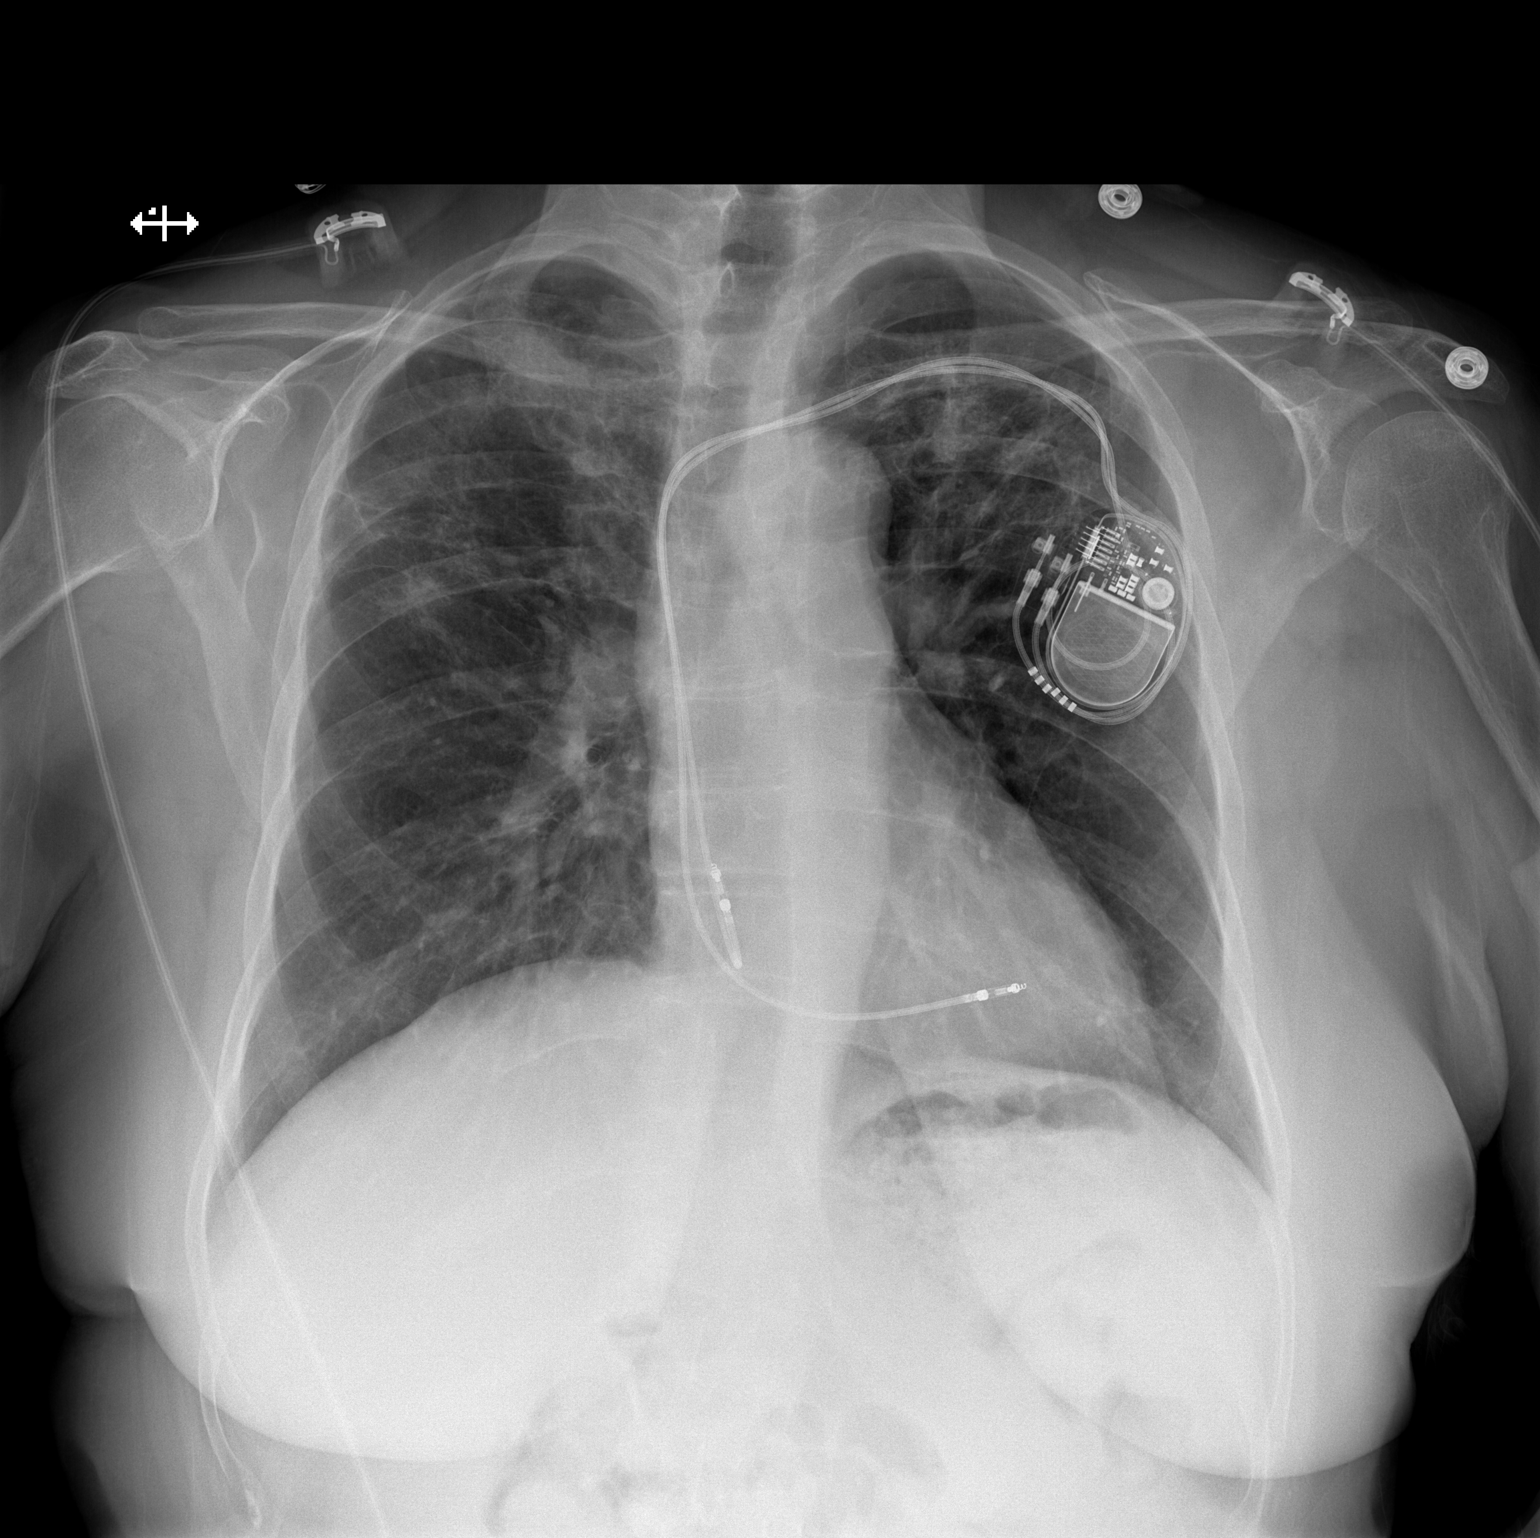

[w chest lat]
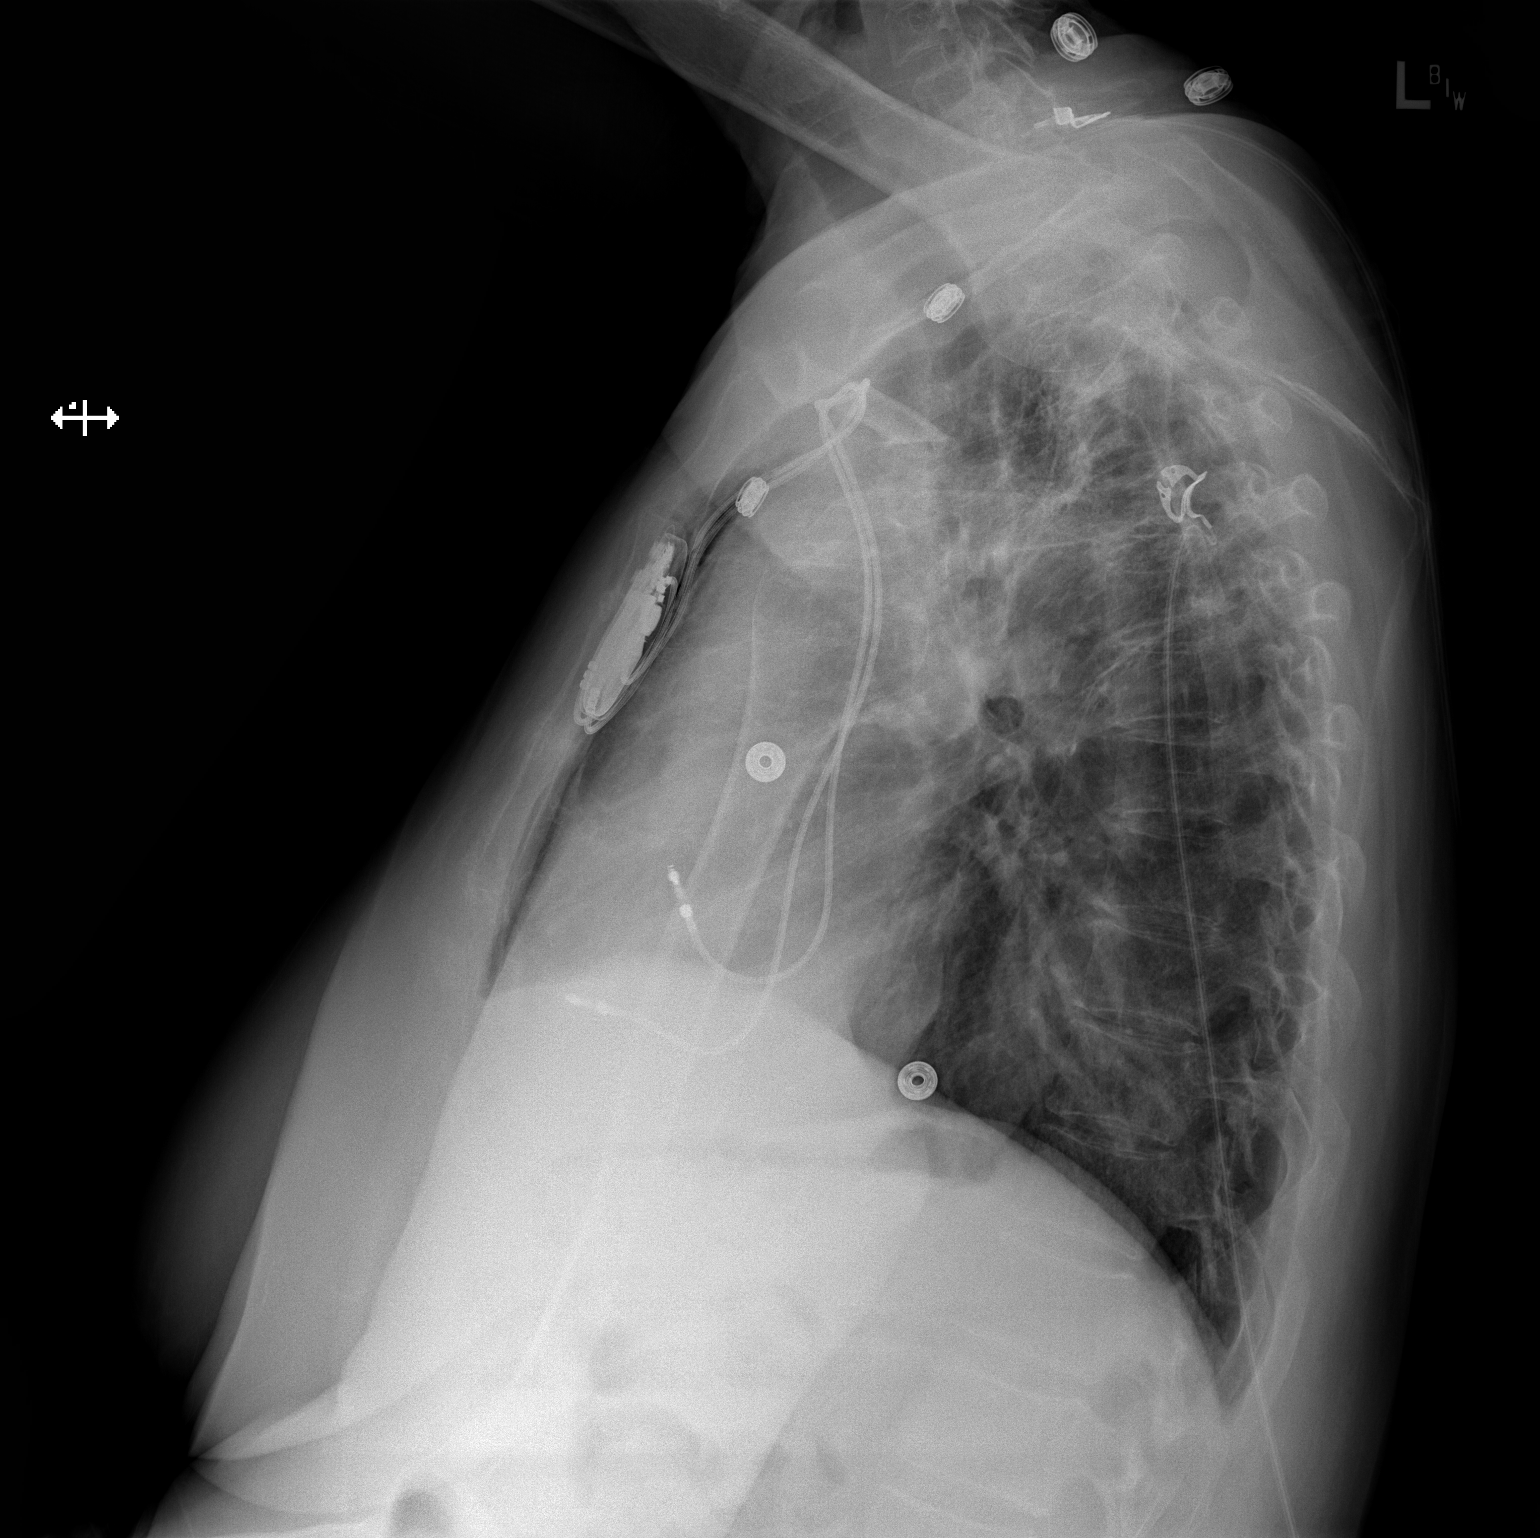

[2 of 2 positions shown; findings below may reference images not displayed]

FINDINGS: Frontal and lateral views of the chest demonstrate dual lead pacer
within the left anterior chest wall, proximal lead in the region of
the right atrium and distal lead in the region of the right
ventricle. Cardiac silhouette is unremarkable. Stable linear
opacities within the upper lung zones compatible with scarring. No
acute airspace disease, effusion, or pneumothorax. No acute bony
abnormalities.
IMPRESSION: 1. No complication after pacemaker placement.
2. Stable upper lobe predominant scarring.

## 2022-01-30 DIAGNOSIS — Z8639 Personal history of other endocrine, nutritional and metabolic disease: Secondary | ICD-10-CM | POA: Diagnosis not present

## 2022-01-30 DIAGNOSIS — D869 Sarcoidosis, unspecified: Secondary | ICD-10-CM | POA: Diagnosis not present

## 2022-01-30 DIAGNOSIS — Z8489 Family history of other specified conditions: Secondary | ICD-10-CM | POA: Diagnosis not present

## 2022-01-30 DIAGNOSIS — Z87442 Personal history of urinary calculi: Secondary | ICD-10-CM | POA: Diagnosis not present

## 2022-01-30 DIAGNOSIS — K219 Gastro-esophageal reflux disease without esophagitis: Secondary | ICD-10-CM | POA: Diagnosis not present

## 2022-01-30 DIAGNOSIS — M810 Age-related osteoporosis without current pathological fracture: Secondary | ICD-10-CM | POA: Diagnosis not present

## 2022-02-02 DIAGNOSIS — Z87442 Personal history of urinary calculi: Secondary | ICD-10-CM | POA: Diagnosis not present

## 2022-02-02 DIAGNOSIS — M810 Age-related osteoporosis without current pathological fracture: Secondary | ICD-10-CM | POA: Diagnosis not present

## 2022-02-02 DIAGNOSIS — D869 Sarcoidosis, unspecified: Secondary | ICD-10-CM | POA: Diagnosis not present

## 2022-02-09 NOTE — Therapy (Addendum)
?OUTPATIENT PHYSICAL THERAPY CERVICAL EVALUATION ? ? ?Patient Name: Julia Solis ?MRN: [id number] ?DOB:1951/02/24, 71 y.o., female ?Today's Date: 02/10/2022 ? ? PT End of Session - 02/10/22 1321   ? ? Visit Number 1   ? Date for PT Re-Evaluation 04/07/22   ? Authorization Type [employer]   ? Progress Note Due on Visit 10   ? PT Start Time 1231   ? PT Stop Time 1316   ? PT Time Calculation (min) 45 min   ? Activity Tolerance Patient tolerated treatment well   ? Behavior During Therapy [facility] for tasks assessed/performed   ? ?  ?  ? ?  ? ? ?Past Medical History:  ?Diagnosis Date  ? 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 Complication of anesthesia   ? GERD (gastroesophageal reflux disease)   ? Hearing aid worn   ? both ears  ? History of chemotherapy 2011  ? History of hiatal hernia   ? History of kidney stones   ? Hypertension   ? Neuromuscular disorder ([place])   ? left foot numbness since  eback surgery  ? Osteoporosis   ? PONV (postoperative nausea and vomiting)   ? Presence of permanent cardiac pacemaker   ? Sarcoidosis   ? in remission sees pumonary  once a year dr bellinger lov 08-21-2020 care everywhere  ? Sick sinus syndrome ([place])   ? Wears glasses   ? ?Past Surgical History:  ?Procedure Laterality Date  ? ABDOMINAL HYSTERECTOMY  2011  ? APPENDECTOMY  2011  ?  ?with hysterectomy  ? [employer]  ? lumbar  ? [place]  ? COSMETIC SURGERY  2007  ? brow lift  ? CYSTOSCOPY WITH RETROGRADE PYELOGRAM, URETEROSCOPY AND STENT PLACEMENT Left 02/04/2021  ? Procedure: CYSTOSCOPY WITH RETROGRADE PYELOGRAM, URETEROSCOPY AND STENT PLACEMENT;  Surgeon: Remi Haggard, MD;  Location: [facility];  Service: Urology;  Laterality: Left;  1 HR; nephrolithiasis  ? [place]  ? EXTRACORPOREAL SHOCK WAVE LITHOTRIPSY Left 06/23/2018  ? Procedure: LEFT EXTRACORPOREAL SHOCK WAVE LITHOTRIPSY (ESWL);  Surgeon: Cleon Gustin, MD;  [facility];  Service: Urology;   Laterality: Left;  ? GANGLION CYST EXCISION  1975  ? HOLMIUM LASER APPLICATION Left 62/56/3893  ? Procedure: HOLMIUM LASER APPLICATION;  Surgeon: Remi Haggard, MD;  Location[facility];  Service: Urology;  Laterality: Left;  ? PACEMAKER IMPLANT N/A 11/13/2020  ? Procedure: PACEMAKER IMPLANT;  Surgeon: Thompson Grayer, MD;  Location: [facility];  Service: Cardiovascular;  Laterality: N/A;  ? TONSILLECTOMY  1957  ? adenoids removed  ? ?Patient Active Problem List  ? Diagnosis Date Noted  ? Dry eye syndrome 10/16/2015  ? NS (nuclear sclerosis) 10/16/2015  ? 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 Dry eye syndrome of both lacrimal glands 10/16/2015  ? Elevated LDL cholesterol level 02/26/2015  ? Back pain 02/20/2014  ? Insomnia 02/20/2014  ? Allergic rhinitis 02/20/2014  ? Osteopenia 07/07/2012  ? Fatigue 08/17/2011  ? Nonspecific (abnormal) findings on radiological and other examination of body structure 06/24/2010  ? ABNORMAL LUNG XRAY 06/24/2010  ? NEOPLASM, MALIGNANT, OVARY, CARCINOMA 06/23/2010  ? ? ?PCP: Tedra Senegal, MD ? ?REFERRING PROVIDER: Delrae Rend, MD ? ?REFERRING DIAG: ovarian cancer, GERD, HTN ? ?THERAPY DIAG:  ?Muscle weakness (generalized) - Plan: PT plan of care cert/re-cert ? ?Abnormal posture - Plan: PT plan of care cert/re-cert ? ?ONSET DATE: Osteoporosis diagnosed 11/05/21 ? ?  SUBJECTIVE:                                                                                                                                                                                                        ? ?SUBJECTIVE STATEMENT: ?Pt presents to PT with diagnosis of osteoporosis.  I don't like to exercise.  I do like to walk and I do some exercises that my PT gave me for my balance and I walk in the water.   ? ?PERTINENT HISTORY:  ?Osteoporosis: T score -2.6 (spine), femur (-1.2 to 1.5) ? ?PAIN:  ?Are you having pain?  ? ?PRECAUTIONS: Other: osteoporosis  ? ?WEIGHT BEARING RESTRICTIONS  No ? ?FALLS:  ?Has patient fallen in last 6 months? No ? ?LIVING ENVIRONMENT: ?Lives with: lives with their family ?Lives in: House/apartment ?Stairs: yes ?Has following equipment at home: None ? ?OCCUPATION: retired ? ?PLOF: Independent ? ?PATIENT GOALS education regarding osteoporosis, what to avoid  ? ?OBJECTIVE: 02/10/22 ? ?DIAGNOSTIC FINDINGS:  ?Dexa Scan ? ?COGNITION: ?Overall cognitive status: Within functional limits for tasks assessed ? ?SENSATION: ?WFL ? ?POSTURE:  ?Forward head and rounded shoulder posture  ?  ? ?UE MMT: ?4+/5, core strength 4-/5, LE strength Lt 4+/5, Rt 5/5 ? ?TODAY'S TREATMENT:  ?Treatment on date: 02/10/22 ?Initiated HEP ?Osteoporosis education ? ? ?PATIENT EDUCATION:  ?Education details: Access Code: [email or web address], osteoporosis education ?Person educated: Patient ?Education method: Explanation, Demonstration, and Handouts ?Education comprehension: verbalized understanding and returned demonstration ? ? ?HOME EXERCISE PROGRAM: ?Access Code: [email or web address] ?URL: [email or web address] ?Date: 02/10/2022 ?Prepared by: Claiborne Billings ? ?Exercises ?- Standing Hip Abduction with Counter Support  - 2 x daily - 7 x weekly - 2 sets - 10 reps ?- Standing Hip Extension with Counter Support  - 2 x daily - 7 x weekly - 2 sets - 10 reps ?- Sit to Stand Without Arm Support  - 1 x daily - 7 x weekly - 3 sets - 10 reps ?- Seated Hamstring Stretch  - 1 x daily - 7 x weekly - 1 sets - 3 reps - 20 hold ?- Seated Shoulder Horizontal Abduction with Resistance  - 2 x daily - 7 x weekly - 2 sets - 10 reps ?- Standing Shoulder External Rotation with Resistance  - 2 x daily - 7 x weekly - 2 sets - 10 reps ?- Seated Figure 4 Piriformis Stretch  - 3 x daily - 7 x weekly - 1 sets - 3 reps - 20 hold ? ?ASSESSMENT: ? ?CLINICAL IMPRESSION: ?Patient is a 71 y.o. female who was seen today for  physical therapy evaluation and treatment for osteoporosis. Pt received education regarding osteoporosis, fall prevention and body  mechanics to avoid fracture. PT initiated HEP for postural and weight bearing strength.  Pt will return in 3-4 weeks for follow-up to update HEP and answer any questions.  Patient will benefit from skilled PT to address the below impairments and improve overall function.  ? ? ?OBJECTIVE IMPAIRMENTS decreased strength, improper body mechanics, and postural dysfunction.  ? ?ACTIVITY LIMITATIONS cleaning, community activity, and meal prep.  ? ?PERSONAL FACTORS 1-2 comorbidities: pacemaker, osteoporosis, cancer  are also affecting patient's functional outcome.  ? ? ?REHAB POTENTIAL: Excellent ? ?CLINICAL DECISION MAKING: Stable/uncomplicated ? ?EVALUATION COMPLEXITY: Low ? ? ?GOALS: ?Goals reviewed with patient? Yes ? ?SHORT TERM GOALS: Target date: 03/10/2022 ? ?Be independent in initial HEP for postural strength and weightbearing activity ?Baseline:  ?Goal status: INITIAL ? ? ?LONG TERM GOALS: Target date: 04/07/2022 ? ?Patient will verbally understand ways to manage osteoporosis with diet and correct to reduce strain postures on spine. ?Baseline:  ?Goal status: INITIAL ? ?2.  Patient will verbally understand correct body mechanics for home and work tasks to decrease strain on spine. ?Baseline:  ?Goal status: INITIAL ? ?3.  Patient will verbally understand ways to strengthen postural musculature. ?Baseline:  ?Goal status: INITIAL ? ?4.  Patient can verbally understand the dos and don'ts of osteoporosis management. ?Baseline:  ?Goal status: INITIAL ? ? ? ?PLAN: ?PT FREQUENCY:  every 2-4 weeks  ? ?PT DURATION: 8 weeks ? ?PLANNED INTERVENTIONS: Therapeutic exercises, Therapeutic activity, Neuromuscular re-education, Patient/Family education, and Manual therapy ? ?PLAN FOR NEXT SESSION: review HEP, make changes as needed to HEP, review any osteoporosis information ? ? ?Sigurd Sos, PT ?02/10/22 1:26 PM ? ? ? ? ?  ?

## 2022-02-10 ENCOUNTER — Ambulatory Visit: Payer: PPO | Attending: Internal Medicine

## 2022-02-10 DIAGNOSIS — R293 Abnormal posture: Secondary | ICD-10-CM | POA: Insufficient documentation

## 2022-02-10 DIAGNOSIS — M6281 Muscle weakness (generalized): Secondary | ICD-10-CM | POA: Insufficient documentation

## 2022-02-10 DIAGNOSIS — M810 Age-related osteoporosis without current pathological fracture: Secondary | ICD-10-CM | POA: Insufficient documentation

## 2022-02-10 NOTE — Patient Instructions (Signed)
? ? ?Osteoporosis ? ? ?What is Osteoporosis? ? ?A silent disease in which the skeleton is weakened by decreased bone density. ?Characterized by low bone mass, deterioration of bone, and increased risk of fracture postmenopausal (primary) or the result of an identifiable condition/event (secondary) ?Commonly found in the wrists, spine, and hips; these are high-risk stress areas and very susceptible to fractures. ? ?The Facts: ?There are 1.5 million fractures/year ?500,000 spine; 250,000 hip with over 60,000 nursing home admissions secondary to hip fracture; and 200,000 wrist ?After hip fracture, only 50% of people able to walk independently prior to the fracture return to independent ambulation. ?Bone mass: Peaks at age 75-30, and begins declining at age 25-50. ? ? ?Osteoporosis is defined by the [facility]) as: ? ?NOF/WHO Criteria for Interpreting Results of Bone Density Assessment  ?Results Diagnosis  ?Within 1 standard deviation (SD) of young adult mean Normal  ?Between 1 and -2.5 SD below mean, repeat in 2 years Low bone mass (osteopenia)  ?Greater than -2.5 SD below mean Osteoporosis  ?Greater than -2.5 SD below mean and one or more fragility fractures exist Severe Osteoporosis  ?*Results can be affected by positioning of the body in the DEXA scan, presence of current or old fractures, arthritis, extraneous calcifications.  ? ? ?Osteoporosis is not just a women?s disease! ? ?30-40% of women will develop osteoporosis ?5-15% of males will develop osteoporosis ? ? ?What are the risk factors? ? ?Female ?Thin, small frame ?Caucasian, Asian race ?Early menopause (<89 years old)/amenorrhea/delayed puberty ?Old age ?Family history (fractures, stooped posture)\ ?Low calcium diet ?Sedentary lifestyle ?Alcohol, Caffeine, Smoking ?Malnutrition, GI Disease ?Prolonged use of Glucocorticoids (Prednisone), Meds to treat asthma, arthritis, cancers, thyroid, and anti-seizure meds. ? ?How do I know for  sure? ? ?Get a BONE DENSITY TEST!  This measures bone loss and it?s painless, non-invasive, and only takes 5-10 minutes! ? ?What can I do about it? ? ?Decrease your risk factors (alcohol, caffeine, smoking) ?Helpful medications (see next page) ?Adequate Calcium and Vitamin D intake ?Get active! ?Proper posture - Sit and stand tall! No slouching or twisting ?Weight-Bearing Exercise - walking, stair climbing, elliptical; NO jogging or high-impact exercise. ?Resistive Exercise - Cybex weight equipment, Nautilus, dumbbells, therabands ? ?**Be sure to maintain proper alignment when lifting any weight!! ? ?**When using equipment, avoid abdominal exercises which involve ?crunching? or curling or twisting the trunk, biceps machines, cross-country machines, moving handlebars, or ANY MACHINE WITH ROTATION OR FORWARD BENDING!!! ? ? ? ? ? ? ? ? ? ? ?Approved Pharmacologic Management of Osteoporosis ? ?Agent Approved for prevention Approved for treatment BMD increased spine/hip Fracture reduction  ?Estrogen/Hormone Therapy ?(Estrace, Estratab, Ogen, Premarin, Vivell, Prempro, Femhert, Orthoest) Yes Yes 3-6% 35% spine and hip  ?Bisphosphonates  ?(Fosamax, Actonel, Boniva) Yes Yes 3-8% 35-50% spine and non-spine  ?Calcitonin ?(Miacalcin, Calcimar, Fortical) No Yes 0-3% None stated  ?Raloxifene ?(Evista) Yes Yes 2-3% 30-55%  ?Parathyroid Hormone ?(Forteo) No Yes, only in those at high risk for fracture None stated 53-65%  ? ? ? ?Recommended Daily Calcium Intakes ? ? ?Population Group NIH/NOF* ?(mg elemental calcium)  ?Children 1-10 years [phone]  ?Children 11-24 years 1200-1500  ?Men and Women 25-64 years At least 1200  ?Pregnant/Lactating At least 1200  ?Postmenopausal women with hormone replacement therapy At least 1200  ?Postmenopausal women without  ? hormone replacement therapy At least 1200  ?Men and women 18 + At least 1200  ?*In 1987, 1990, 1994, and 2000, the NIH held consensus conferences  on osteoporosis and calcium.   This column shows the most recent recommendations regarding calcium intak for preventing and managing osteoporosis.  ? ? ? ? ? ? ? ? ?Calcium Content of Selected Foods ? ?Dairy Foods Calcium Content (mg) Non-Dairy Foods Calcium Content (mg)  ?Buttermilk, 1 cup 300 Calcium-fortified juice, ?1 cup 300  ?Milk, 1 cup 300 Salmon, canned with ?Bones, 2 oz 100  ?Lactaid milk, 1 cup 300-500 Oysters, raw ?13-19 medium 226  ?Soy milk, 1 cup 200-300 Sardines, canned with ?bones, 3 oz 372  ?Yogurt (plain, lowfat) ?1 cup 250-300 Shrimp, canned ?3 oz 98  ?Frozen yogurt (fruit) ?1 cup 200-600 Collard greens, cooked ?1 cup 357  ?Cheddar, mozzarella, or ?[place] cheese, 1oz 205 Broccoli, cooked ?1 cup 78  ?Cottage cheese (lowfat) ?4 oz 200 Soybeans, cooked ?1 cup 131  ?Part-skim ricotta cheese, ?4oz 335 Tofu, 4oz* *  ?Vanilla ice cream, ?1 cup 120-300    ?*Calcium content of tofu varies depending on processing method; check nutritional label on package for precise calcium content.  ? ? ? ?Suggested Guidelines for Calcium Supplement Use: ? ?Calcium is absorbed most efficiently if taken in small amounts throughout the day.  Always divide the daily dose into smaller amounts if the total daily dose is '500mg'$  or more per day.  The body cannot use more than '500mg'$  Calcium at any one time. ?The use of manufactured supplements is encouraged.  Calcium as bone meal or dolomite may contain lead or other heavy metals as contaminants. ?Calcium supplements should not be taken with high fiber meals or with bulk forming laxatives. ?If calcium carbonate is used as the supplement form, it should be taken with meals to assure that stomach acid production is present to facilitate optimal dissolution and absorption of calcium.  This is important if atrophic gastritis with hypo- or achlorhydria is present, which it is in 20-50% of older individuals. ?It is important to drink plenty of fluids while using the supplement to help reduce problems with side  effects like constipation or bloating.  If these symptoms become a problem, switching to another form of supplement may be the answer. ?Another alternative is calcium-fortified foods, including fruit juices, cereals, and breads.  These foods are now marketed with added calcium and may be less likely to cause side effects. ?Those with personal or family histories of kidney stones should be monitored to assure that hypercalcuria does not occur. ?CALCIUM INTAKE QUIZ ? ?Dairy products are the primary source of calcium for most people.  For a quick estimate of your daily calcium intake, complete the following steps: ? ?Use the chart below to determine your daily intake of calcium from diary foods. ?Servings of dairy per day '1 2 3 4 5 6 7 8  '$ ?Milligrams (mg) of calcium: 250 [phone] 1250 1500 1750 2000  ? ?2.  Enter your total daily calcium intake from dairy foods:     _____mg ? ?3.  Add 350 mg, which is the average for all other dietary sources:                 +            350 mg ? ?4.  The sum of your total daily calcium intake:               ______mg ? ?5.  Enter the recommended calcium intake for your age from the chart below;         ______mg ? ?6.  Enter your daily intake from step 4 above and subtract:                             -        _______mg ? ?7.  The result is how much additional calcium you need:                                          ______mg ? ? ? ? ? ?Recommended Daily Calcium Intake ? ?Population Calcium (mg)  ?Children 1-10 years [phone]  ?Children 11-24 years 1200-1500  ?Men and women 25-64 At least 1200  ?Pregnant/Lactating At least 1200  ?Postmenopausal women with hormone replacement therapy At least 1200  ?Postmenopausal women without hormone replacement therapy At least 1200  ?Men and women 65+ At least 1200  ? ? ? ? ? ?SAFETY TIPS FOR FALL PREVENTION ? ? ?Remove throw rugs and make certain carpet edges are securely fastened to the floor. ? ?Reduce clutter, especially in traffic areas of  the home. ? ? ?Install/maintain sturdy handrails at stairs. ? ?Increase wattage of lighting in hallways, bathrooms, kitchens, stairwells, and entrances to home. ? ?Use night-lights near bed, in hallways, and in

## 2022-02-12 ENCOUNTER — Ambulatory Visit (INDEPENDENT_AMBULATORY_CARE_PROVIDER_SITE_OTHER): Payer: PPO

## 2022-02-12 DIAGNOSIS — I495 Sick sinus syndrome: Secondary | ICD-10-CM | POA: Diagnosis not present

## 2022-02-12 DIAGNOSIS — R310 Gross hematuria: Secondary | ICD-10-CM | POA: Diagnosis not present

## 2022-02-12 DIAGNOSIS — R3121 Asymptomatic microscopic hematuria: Secondary | ICD-10-CM | POA: Diagnosis not present

## 2022-02-12 DIAGNOSIS — Z87442 Personal history of urinary calculi: Secondary | ICD-10-CM | POA: Diagnosis not present

## 2022-02-12 LAB — CUP PACEART REMOTE DEVICE CHECK
Date Time Interrogation Session: 20230511104840
Implantable Lead Implant Date: 20220201
Implantable Lead Implant Date: 20220201
Implantable Lead Location: 753859
Implantable Lead Location: 753860
Implantable Pulse Generator Implant Date: 20220201
Pulse Gen Model: 2272
Pulse Gen Serial Number: 3896658

## 2022-02-19 NOTE — Progress Notes (Signed)
Remote pacemaker transmission.   

## 2022-02-20 ENCOUNTER — Encounter: Payer: PPO | Admitting: Physician Assistant

## 2022-02-23 DIAGNOSIS — Z87442 Personal history of urinary calculi: Secondary | ICD-10-CM | POA: Diagnosis not present

## 2022-03-20 ENCOUNTER — Ambulatory Visit: Payer: PPO | Admitting: Internal Medicine

## 2022-03-20 ENCOUNTER — Other Ambulatory Visit: Payer: Self-pay | Admitting: Internal Medicine

## 2022-03-20 ENCOUNTER — Encounter: Payer: Self-pay | Admitting: Internal Medicine

## 2022-03-20 DIAGNOSIS — I272 Pulmonary hypertension, unspecified: Secondary | ICD-10-CM | POA: Diagnosis not present

## 2022-03-20 DIAGNOSIS — R001 Bradycardia, unspecified: Secondary | ICD-10-CM

## 2022-03-20 DIAGNOSIS — Z950 Presence of cardiac pacemaker: Secondary | ICD-10-CM

## 2022-03-20 NOTE — Progress Notes (Signed)
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  Since last being seen in our clinic, the patient reports doing very well.   She has pulmonary sarcoidosis and allergies.  This leads to intermittent SOB.  She is not active due to back pain.  Today, she denies symptoms of palpitations, chest pain,  lower extremity edema, dizziness, presyncope, or syncope.  The patient is otherwise without complaint today.   Past Medical History:  Diagnosis Date   Cancer [facility]) 2011   ovarian   Complication of anesthesia    GERD (gastroesophageal reflux disease)    Hearing aid worn    both ears   History of chemotherapy 2011   History of hiatal hernia    History of kidney stones    Hypertension    Neuromuscular disorder ([place])    left foot numbness since  eback surgery   Osteoporosis    PONV (postoperative nausea and vomiting)    Presence of permanent cardiac pacemaker    Sarcoidosis    in remission sees pumonary  once a year dr bellinger lov 08-21-2020 care everywhere   Sick sinus syndrome [facility])    Wears glasses    Past Surgical History:  Procedure Laterality Date   ABDOMINAL HYSTERECTOMY  2011   APPENDECTOMY  2011    ?with hysterectomy   BACK SURGERY  1997   lumbar   CESAREAN SECTION  1982, 1985   COSMETIC SURGERY  2007   brow lift   CYSTOSCOPY WITH RETROGRADE PYELOGRAM, URETEROSCOPY AND STENT PLACEMENT Left 02/04/2021   Procedure: CYSTOSCOPY WITH RETROGRADE PYELOGRAM, URETEROSCOPY AND STENT PLACEMENT;  Surgeon: Remi Haggard, MD;  Location: [facility];  Service: Urology;  Laterality: Left;  1 HR; nephrolithiasis   ECTOPIC PREGNANCY SURGERY  1980   EXTRACORPOREAL SHOCK WAVE LITHOTRIPSY Left 06/23/2018   Procedure: LEFT EXTRACORPOREAL SHOCK WAVE LITHOTRIPSY (ESWL);  Surgeon: Cleon Gustin, MD;  [facility];  Service: Urology;  Laterality: Left;   GANGLION CYST  EXCISION  1975   HOLMIUM LASER APPLICATION Left 16/07/9603   Procedure: HOLMIUM LASER APPLICATION;  Surgeon: Remi Haggard, MD;  Location[facility];  Service: Urology;  Laterality: Left;   PACEMAKER IMPLANT N/A 11/13/2020   Procedure: PACEMAKER IMPLANT;  Surgeon: Thompson Grayer, MD;  Location: [place] [facility];  Service: Cardiovascular;  Laterality: N/A;   TONSILLECTOMY  1957   adenoids removed    ROS- all systems are reviewed and negative except as per HPI above  Current Outpatient Medications  Medication Sig Dispense Refill   Cholecalciferol (VITAMIN D) 50 MCG (2000 UT) tablet Take 4,000 Units by mouth daily.     diclofenac Sodium (VOLTAREN) 1 % GEL Apply 1 application topically 4 (four) times daily as needed (pain).     famotidine (PEPCID) 10 MG tablet Take 10 mg by mouth daily as needed for heartburn or indigestion.     loratadine (CLARITIN) 10 MG tablet Take 10 mg by mouth daily.     LORazepam (ATIVAN) 1 MG tablet TAKE 1 TABLET BY MOUTH EVERY DAY AT BEDTIME AS NEEDED 90 tablet 1   losartan (COZAAR) 25 MG tablet Take 1 tablet (25 mg total) by mouth daily. 90 tablet 3   meloxicam (MOBIC) 15 MG tablet TAKE 1 TABLET BY MOUTH EVERY DAY 90 tablet 0   Polyethyl Glycol-Propyl Glycol (SYSTANE) 0.4-0.3 % GEL ophthalmic  gel Place 1 application into both eyes in the morning and at bedtime.     polyethylene glycol (MIRALAX / GLYCOLAX) packet Take 17 g by mouth daily as needed for moderate constipation.     rosuvastatin (CRESTOR) 5 MG tablet TAKE 1 TABLET BY MOUTH 3 TIMES A WEEK WITH SUPPER (Patient taking differently: TAKE 1 TABLET BY MOUTH 3 TIMES WEEKLY) 36 tablet 2   No current facility-administered medications for this visit.    Physical Exam: Vitals:   03/20/22 1445  BP: 140/78  Pulse: 60  SpO2: 98%  Weight: 189 lb 12.5 oz (86.1 kg)  Height: '5\' 4"'$  (1.626 m)    GEN- The patient is well appearing, alert and oriented x 3 today.   Head- normocephalic,  atraumatic Eyes-  Sclera clear, conjunctiva pink Ears- hearing intact Oropharynx- clear Lungs- Clear to ausculation bilaterally, normal work of breathing Chest- pacemaker pocket is well healed Heart- Regular rate and rhythm, no murmurs, rubs or gallops, PMI not laterally displaced GI- soft, NT, ND, + BS Extremities- no clubbing, cyanosis, or edema  Pacemaker interrogation- reviewed in detail today,  See PACEART report  ekg tracing ordered today is personally reviewed and shows atrial paced rhythm, PR 184 msec  Assessment and Plan:  1. Symptomatic sinus bradycardia See Pace Art report No changes today she is not device dependant today  2. HTN Stable No change required today  3. Nonsustained atach/ PACS Stable No change required today  4. Pulmonary sarcoidosis Will obtain an echo to evaluate for pulmonary htn.  Return to see EP APP in a year  Thompson Grayer MD, [facility] 03/20/2022 2:52 PM

## 2022-03-20 NOTE — Patient Instructions (Addendum)
Medication Instructions:  Your physician recommends that you continue on your current medications as directed. Please refer to the Current Medication list given to you today. *If you need a refill on your cardiac medications before your next appointment, please call your pharmacy*  Lab Work: None ordered. If you have labs (blood work) drawn today and your tests are completely normal, you will receive your results only by: Frannie (if you have MyChart) OR A paper copy in the mail If you have any lab test that is abnormal or we need to change your treatment, we will call you to review the results.  Testing/Procedures: Your physician has requested that you have an echocardiogram. Echocardiography is a painless test that uses sound waves to create images of your heart. It provides your doctor with information about the size and shape of your heart and how well your heart's chambers and valves are working. This procedure takes approximately one hour. There are no restrictions for this procedure.   Please schedule an Echocardiogram in September 2023.    Follow-Up: At [facility], you and your health needs are our priority.  As part of our continuing mission to provide you with exceptional heart care, we have created designated Provider Care Teams.  These Care Teams include your primary Cardiologist (physician) and Advanced Practice Providers (APPs -  Physician Assistants and Nurse Practitioners) who all work together to provide you with the care you need, when you need it.  Your next appointment:    Your physician wants you to follow-up in: one year  Tommye Standard, PA-C  You will receive a reminder letter in the mail two months in advance. If you don't receive a letter, please call our office to schedule the follow-up appointment.  Remote monitoring is used to monitor your Pacemaker from home. This monitoring reduces the number of office visits required to check your device to one time  per year. It allows [place] to keep an eye on the functioning of your device to ensure it is working properly. You are scheduled for a device check from home on 05/14/2022. You may send your transmission at any time that day. If you have a wireless device, the transmission will be sent automatically. After your physician reviews your transmission, you will receive a postcard with your next transmission date.   Important Information About Sugar

## 2022-03-23 DIAGNOSIS — J342 Deviated nasal septum: Secondary | ICD-10-CM | POA: Diagnosis not present

## 2022-03-23 DIAGNOSIS — R490 Dysphonia: Secondary | ICD-10-CM | POA: Diagnosis not present

## 2022-03-23 DIAGNOSIS — J343 Hypertrophy of nasal turbinates: Secondary | ICD-10-CM | POA: Diagnosis not present

## 2022-03-23 DIAGNOSIS — K219 Gastro-esophageal reflux disease without esophagitis: Secondary | ICD-10-CM | POA: Diagnosis not present

## 2022-03-23 DIAGNOSIS — J310 Chronic rhinitis: Secondary | ICD-10-CM | POA: Diagnosis not present

## 2022-03-30 DIAGNOSIS — M25551 Pain in right hip: Secondary | ICD-10-CM | POA: Diagnosis not present

## 2022-03-30 DIAGNOSIS — M545 Low back pain, unspecified: Secondary | ICD-10-CM | POA: Diagnosis not present

## 2022-04-27 ENCOUNTER — Other Ambulatory Visit: Payer: Self-pay | Admitting: Internal Medicine

## 2022-05-05 ENCOUNTER — Other Ambulatory Visit: Payer: Self-pay | Admitting: Internal Medicine

## 2022-05-14 ENCOUNTER — Ambulatory Visit (INDEPENDENT_AMBULATORY_CARE_PROVIDER_SITE_OTHER): Payer: PPO

## 2022-05-14 DIAGNOSIS — I495 Sick sinus syndrome: Secondary | ICD-10-CM

## 2022-05-15 LAB — CUP PACEART REMOTE DEVICE CHECK
Date Time Interrogation Session: 20230811082143
Implantable Lead Implant Date: 20220201
Implantable Lead Implant Date: 20220201
Implantable Lead Location: 753859
Implantable Lead Location: 753860
Implantable Pulse Generator Implant Date: 20220201
Pulse Gen Model: 2272
Pulse Gen Serial Number: 3896658

## 2022-05-22 ENCOUNTER — Ambulatory Visit (INDEPENDENT_AMBULATORY_CARE_PROVIDER_SITE_OTHER): Payer: PPO | Admitting: Internal Medicine

## 2022-05-22 ENCOUNTER — Encounter: Payer: Self-pay | Admitting: Internal Medicine

## 2022-05-22 VITALS — BP 126/78 | HR 61 | Temp 98.6°F

## 2022-05-22 DIAGNOSIS — Z8543 Personal history of malignant neoplasm of ovary: Secondary | ICD-10-CM | POA: Diagnosis not present

## 2022-05-22 DIAGNOSIS — Z8709 Personal history of other diseases of the respiratory system: Secondary | ICD-10-CM

## 2022-05-22 DIAGNOSIS — J22 Unspecified acute lower respiratory infection: Secondary | ICD-10-CM

## 2022-05-22 DIAGNOSIS — D860 Sarcoidosis of lung: Secondary | ICD-10-CM | POA: Diagnosis not present

## 2022-05-22 MED ORDER — METHYLPREDNISOLONE 4 MG PO TBPK: ORAL_TABLET | ORAL | 1 refills | Status: DC

## 2022-05-22 MED ORDER — BENZONATATE 100 MG PO CAPS: 100.0000 mg | ORAL_CAPSULE | Freq: Two times a day (BID) | ORAL | 0 refills | Status: DC | PRN

## 2022-05-22 MED ORDER — DOXYCYCLINE HYCLATE 100 MG PO TABS: 100.0000 mg | ORAL_TABLET | Freq: Two times a day (BID) | ORAL | 0 refills | Status: DC

## 2022-05-22 NOTE — Progress Notes (Signed)
   Subjective:    Patient ID: Julia Solis, female    DOB: [birth year], 71 y.o.   MRN: [id number]  HPI 71  year old Female  with hx Sarcoidosis. Has intermittent sputum production which is thick and yellow at times. No fever or shaking chills. Has also had pink eye recently but used some drops she had and improved. Had allergy testing by Dr. Donneta Romberg which was negative. Went to ENT who dx pt with GERD  and she is on famotidine trial for 2-3 months.  Main complaint is respiratory congestion.  She saw Dr.Teoh, ENT physician in June and was diagnosed with deviated nasal septum and hypertrophy of nasal turbinates along with chronic rhinitis and dysphonia.  Also was diagnosed with GE reflux without esophagitis.  Review of Systems no fever, chills, significant shortness of breath     Objective:   Physical Exam She is afebrile.  Blood pressure 126/78 pulse 61 regular pulse oximetry 97% on room air TMs are clear.  Skin is warm and dry.  No cervical adenopathy.  Chest clear to auscultation.      Assessment & Plan:  She is not in any acute distress but would like to have some relief from nasal congestion.  Likely has an acute respiratory infection that is slow to resolve.  Have prescribed a tapering course of Medrol 4 mg starting with 6 tablets day 1 and decreasing by 1 tablet daily i.e. 6-5-4-3-2-1 taper.  May take doxycycline 100 mg twice daily for 10 days.  She will let me know if not doing better at the end of that time.  Also for cough I have prescribed Tessalon Perles 100 mg twice daily if needed for cough #30 perles prescribed.

## 2022-05-26 DIAGNOSIS — M5416 Radiculopathy, lumbar region: Secondary | ICD-10-CM | POA: Diagnosis not present

## 2022-05-28 ENCOUNTER — Other Ambulatory Visit (HOSPITAL_COMMUNITY): Payer: Self-pay | Admitting: Orthopedic Surgery

## 2022-05-28 ENCOUNTER — Other Ambulatory Visit: Payer: Self-pay | Admitting: Orthopedic Surgery

## 2022-05-28 DIAGNOSIS — M5440 Lumbago with sciatica, unspecified side: Secondary | ICD-10-CM

## 2022-05-31 NOTE — Patient Instructions (Signed)
Take Medrol in tapering course as directed starting with 6 tablets day 1 and decreasing by 1 tablet daily.  May take doxycycline 100 mg twice daily for 10 days.  May take Tessalon Perles 100 mg twice daily if needed for cough.

## 2022-06-04 DIAGNOSIS — J343 Hypertrophy of nasal turbinates: Secondary | ICD-10-CM | POA: Diagnosis not present

## 2022-06-04 DIAGNOSIS — J310 Chronic rhinitis: Secondary | ICD-10-CM | POA: Diagnosis not present

## 2022-06-04 DIAGNOSIS — J342 Deviated nasal septum: Secondary | ICD-10-CM | POA: Diagnosis not present

## 2022-06-10 NOTE — Progress Notes (Signed)
Remote pacemaker transmission.   

## 2022-06-11 NOTE — Progress Notes (Signed)
Remote pacemaker transmission.   

## 2022-06-22 ENCOUNTER — Ambulatory Visit (HOSPITAL_COMMUNITY): Payer: PPO | Attending: Internal Medicine

## 2022-06-22 DIAGNOSIS — I272 Pulmonary hypertension, unspecified: Secondary | ICD-10-CM | POA: Insufficient documentation

## 2022-06-22 LAB — ECHOCARDIOGRAM COMPLETE
Area-P 1/2: 3.99 cm2
S' Lateral: 3.2 cm

## 2022-06-30 DIAGNOSIS — I272 Pulmonary hypertension, unspecified: Secondary | ICD-10-CM | POA: Diagnosis not present

## 2022-06-30 DIAGNOSIS — D869 Sarcoidosis, unspecified: Secondary | ICD-10-CM | POA: Diagnosis not present

## 2022-07-01 DIAGNOSIS — D869 Sarcoidosis, unspecified: Secondary | ICD-10-CM | POA: Diagnosis not present

## 2022-07-02 DIAGNOSIS — D869 Sarcoidosis, unspecified: Secondary | ICD-10-CM | POA: Diagnosis not present

## 2022-07-02 DIAGNOSIS — Z87891 Personal history of nicotine dependence: Secondary | ICD-10-CM | POA: Diagnosis not present

## 2022-07-07 DIAGNOSIS — H903 Sensorineural hearing loss, bilateral: Secondary | ICD-10-CM | POA: Diagnosis not present

## 2022-07-15 ENCOUNTER — Ambulatory Visit (HOSPITAL_COMMUNITY)
Admission: RE | Admit: 2022-07-15 | Discharge: 2022-07-15 | Disposition: A | Discharge status: Home / Self Care | Payer: PPO | Source: Ambulatory Visit | Attending: Orthopedic Surgery | Admitting: Orthopedic Surgery

## 2022-07-15 DIAGNOSIS — M545 Low back pain, unspecified: Secondary | ICD-10-CM | POA: Diagnosis not present

## 2022-07-15 DIAGNOSIS — M5440 Lumbago with sciatica, unspecified side: Secondary | ICD-10-CM | POA: Diagnosis not present

## 2022-07-15 DIAGNOSIS — M5136 Other intervertebral disc degeneration, lumbar region: Secondary | ICD-10-CM | POA: Diagnosis not present

## 2022-07-20 DIAGNOSIS — M533 Sacrococcygeal disorders, not elsewhere classified: Secondary | ICD-10-CM | POA: Diagnosis not present

## 2022-07-20 DIAGNOSIS — M4696 Unspecified inflammatory spondylopathy, lumbar region: Secondary | ICD-10-CM | POA: Diagnosis not present

## 2022-07-24 ENCOUNTER — Telehealth: Payer: Self-pay | Admitting: Internal Medicine

## 2022-07-24 ENCOUNTER — Encounter: Payer: Self-pay | Admitting: Internal Medicine

## 2022-07-24 ENCOUNTER — Ambulatory Visit (INDEPENDENT_AMBULATORY_CARE_PROVIDER_SITE_OTHER): Payer: PPO | Admitting: Internal Medicine

## 2022-07-24 VITALS — BP 126/70 | HR 71 | Temp 97.8°F | Ht 64.0 in | Wt 186.8 lb

## 2022-07-24 DIAGNOSIS — L259 Unspecified contact dermatitis, unspecified cause: Secondary | ICD-10-CM | POA: Diagnosis not present

## 2022-07-24 MED ORDER — TRIAMCINOLONE ACETONIDE 0.1 % EX CREA: 1.0000 | TOPICAL_CREAM | Freq: Three times a day (TID) | CUTANEOUS | 1 refills | Status: DC

## 2022-07-24 MED ORDER — METHYLPREDNISOLONE 4 MG PO TABS: ORAL_TABLET | ORAL | 0 refills | Status: DC

## 2022-07-24 NOTE — Progress Notes (Signed)
   Subjective:    Patient ID: Julia Solis, female    DOB: [birth year], 71 y.o.   MRN: [id number]  HPI 71 year old Female with history of sarcoidosis followed at [facility], hypertension, pure hypercholesterolemia, musculoskeletal pain, history of kidney stones, history of sick sinus syndrome and atrial tachycardia requiring pacemaker February 2022, remote history of ovarian cancer seen with rash on her back that is somewhat painful and itchy.  She is concerned this could be shingles.  She is having issues seeing this rash and has to look at it using a mirror.    Review of Systems see above     Objective:   Physical Exam She is afebrile. She has small area of maculopapular.  erythema.  Does not appear to be typical for Herpes zoster.  Was vaccinated with Zostavax vaccine in 2011.      Assessment & Plan:  I think this is most likely a contact dermatitis and not shingles.  It does not look typical to me of shingles.  It is itchy.  I am going to give her a methylprednisolone (Medrol) 4 mg Dosepak to take in tapering course starting with 6 tablets day 1 and decreasing by 1 tablet daily.  Also prescribed triamcinolone cream 0.1% to rash 3 times daily.  She will call if not improving or if symptoms worsen.

## 2022-07-24 NOTE — Telephone Encounter (Signed)
Julia Solis [phone]  [name] called to say she woke up with a rash on her back yesterday that hurts and itches, Scheduled her to come in at 12:00 today

## 2022-08-04 ENCOUNTER — Other Ambulatory Visit: Payer: Self-pay | Admitting: Orthopedic Surgery

## 2022-08-04 DIAGNOSIS — M545 Low back pain, unspecified: Secondary | ICD-10-CM

## 2022-08-04 NOTE — Patient Instructions (Signed)
Apply triamcinolone cream to rash 3 times daily until resolved and take Medrol in tapering course as directed.  I think this is most likely a contact dermatitis and not shingles.

## 2022-08-12 DIAGNOSIS — D860 Sarcoidosis of lung: Secondary | ICD-10-CM | POA: Diagnosis not present

## 2022-08-12 DIAGNOSIS — I272 Pulmonary hypertension, unspecified: Secondary | ICD-10-CM | POA: Diagnosis not present

## 2022-08-13 ENCOUNTER — Ambulatory Visit (INDEPENDENT_AMBULATORY_CARE_PROVIDER_SITE_OTHER): Payer: PPO

## 2022-08-13 DIAGNOSIS — I495 Sick sinus syndrome: Secondary | ICD-10-CM | POA: Diagnosis not present

## 2022-08-14 ENCOUNTER — Ambulatory Visit
Admission: RE | Admit: 2022-08-14 | Discharge: 2022-08-14 | Disposition: A | Discharge status: Home / Self Care | Payer: PPO | Source: Ambulatory Visit | Attending: Orthopedic Surgery | Admitting: Orthopedic Surgery

## 2022-08-14 ENCOUNTER — Other Ambulatory Visit: Payer: PPO

## 2022-08-14 DIAGNOSIS — M533 Sacrococcygeal disorders, not elsewhere classified: Secondary | ICD-10-CM | POA: Diagnosis not present

## 2022-08-14 DIAGNOSIS — M545 Low back pain, unspecified: Secondary | ICD-10-CM

## 2022-08-15 LAB — CUP PACEART REMOTE DEVICE CHECK
Date Time Interrogation Session: 20231110073808
Implantable Lead Connection Status: 753985
Implantable Lead Connection Status: 753985
Implantable Lead Implant Date: 20220201
Implantable Lead Implant Date: 20220201
Implantable Lead Location: 753859
Implantable Lead Location: 753860
Implantable Pulse Generator Implant Date: 20220201
Pulse Gen Model: 2272
Pulse Gen Serial Number: 3896658

## 2022-08-17 ENCOUNTER — Other Ambulatory Visit: Payer: PPO

## 2022-08-17 DIAGNOSIS — D2271 Melanocytic nevi of right lower limb, including hip: Secondary | ICD-10-CM | POA: Diagnosis not present

## 2022-08-17 DIAGNOSIS — E7800 Pure hypercholesterolemia, unspecified: Secondary | ICD-10-CM | POA: Diagnosis not present

## 2022-08-17 DIAGNOSIS — R030 Elevated blood-pressure reading, without diagnosis of hypertension: Secondary | ICD-10-CM | POA: Diagnosis not present

## 2022-08-17 DIAGNOSIS — Z85828 Personal history of other malignant neoplasm of skin: Secondary | ICD-10-CM | POA: Diagnosis not present

## 2022-08-17 DIAGNOSIS — R5383 Other fatigue: Secondary | ICD-10-CM

## 2022-08-17 DIAGNOSIS — D2262 Melanocytic nevi of left upper limb, including shoulder: Secondary | ICD-10-CM | POA: Diagnosis not present

## 2022-08-17 DIAGNOSIS — D2272 Melanocytic nevi of left lower limb, including hip: Secondary | ICD-10-CM | POA: Diagnosis not present

## 2022-08-17 DIAGNOSIS — L239 Allergic contact dermatitis, unspecified cause: Secondary | ICD-10-CM | POA: Diagnosis not present

## 2022-08-17 DIAGNOSIS — D2261 Melanocytic nevi of right upper limb, including shoulder: Secondary | ICD-10-CM | POA: Diagnosis not present

## 2022-08-17 DIAGNOSIS — L821 Other seborrheic keratosis: Secondary | ICD-10-CM | POA: Diagnosis not present

## 2022-08-17 DIAGNOSIS — L814 Other melanin hyperpigmentation: Secondary | ICD-10-CM | POA: Diagnosis not present

## 2022-08-17 LAB — COMPLETE METABOLIC PANEL WITH GFR
AG Ratio: 1.8 (calc) (ref 1.0–2.5)
ALT: 5 U/L — ABNORMAL LOW (ref 6–29)
AST: 13 U/L (ref 10–35)
Albumin: 3.7 g/dL (ref 3.6–5.1)
Alkaline phosphatase (APISO): 76 U/L (ref 37–153)
BUN: 15 mg/dL (ref 7–25)
CO2: 29 mmol/L (ref 20–32)
Calcium: 8.9 mg/dL (ref 8.6–10.4)
Chloride: 108 mmol/L (ref 98–110)
Creat: 0.67 mg/dL (ref 0.60–1.00)
Globulin: 2.1 g/dL (calc) (ref 1.9–3.7)
Glucose, Bld: 96 mg/dL (ref 65–99)
Potassium: 4.5 mmol/L (ref 3.5–5.3)
Sodium: 142 mmol/L (ref 135–146)
Total Bilirubin: 0.4 mg/dL (ref 0.2–1.2)
Total Protein: 5.8 g/dL — ABNORMAL LOW (ref 6.1–8.1)
eGFR: 94 mL/min/{1.73_m2} (ref >=60)

## 2022-08-17 LAB — CBC WITH DIFFERENTIAL/PLATELET
Absolute Monocytes: 627 cells/uL (ref 200–950)
Basophils Absolute: 32 cells/uL (ref 0–200)
Basophils Relative: 0.5 %
Eosinophils Absolute: 410 cells/uL (ref 15–500)
Eosinophils Relative: 6.4 %
HCT: 38.4 % (ref 35.0–45.0)
Hemoglobin: 12.6 g/dL (ref 11.7–15.5)
Lymphs Abs: 1747 cells/uL (ref 850–3900)
MCH: 28.4 pg (ref 27.0–33.0)
MCHC: 32.8 g/dL (ref 32.0–36.0)
MCV: 86.5 fL (ref 80.0–100.0)
MPV: 10.5 fL (ref 7.5–12.5)
Monocytes Relative: 9.8 %
Neutro Abs: 3584 cells/uL (ref 1500–7800)
Neutrophils Relative %: 56 %
Platelets: 199 10*3/uL (ref 140–400)
RBC: 4.44 10*6/uL (ref 3.80–5.10)
RDW: 12.9 % (ref 11.0–15.0)
Total Lymphocyte: 27.3 %
WBC: 6.4 10*3/uL (ref 3.8–10.8)

## 2022-08-17 LAB — LIPID PANEL
Cholesterol: 180 mg/dL (ref <200)
HDL: 53 mg/dL (ref >=50)
LDL Cholesterol (Calc): 108 mg/dL (calc) — ABNORMAL HIGH
Non-HDL Cholesterol (Calc): 127 mg/dL (calc) (ref <130)
Total CHOL/HDL Ratio: 3.4 (calc) (ref <5.0)
Triglycerides: 99 mg/dL (ref <150)

## 2022-08-17 LAB — TSH: TSH: 2.58 mIU/L (ref 0.40–4.50)

## 2022-08-19 ENCOUNTER — Encounter: Payer: Self-pay | Admitting: Internal Medicine

## 2022-08-19 ENCOUNTER — Ambulatory Visit (INDEPENDENT_AMBULATORY_CARE_PROVIDER_SITE_OTHER): Payer: PPO | Admitting: Internal Medicine

## 2022-08-19 VITALS — BP 102/70 | HR 84 | Temp 98.3°F | Ht 64.0 in | Wt 184.0 lb

## 2022-08-19 DIAGNOSIS — M810 Age-related osteoporosis without current pathological fracture: Secondary | ICD-10-CM

## 2022-08-19 DIAGNOSIS — E7800 Pure hypercholesterolemia, unspecified: Secondary | ICD-10-CM | POA: Diagnosis not present

## 2022-08-19 DIAGNOSIS — Z8543 Personal history of malignant neoplasm of ovary: Secondary | ICD-10-CM | POA: Diagnosis not present

## 2022-08-19 DIAGNOSIS — D860 Sarcoidosis of lung: Secondary | ICD-10-CM | POA: Diagnosis not present

## 2022-08-19 DIAGNOSIS — Z8679 Personal history of other diseases of the circulatory system: Secondary | ICD-10-CM

## 2022-08-19 DIAGNOSIS — Z87442 Personal history of urinary calculi: Secondary | ICD-10-CM | POA: Diagnosis not present

## 2022-08-19 DIAGNOSIS — Z0000 Encounter for general adult medical examination without abnormal findings: Secondary | ICD-10-CM

## 2022-08-19 DIAGNOSIS — H905 Unspecified sensorineural hearing loss: Secondary | ICD-10-CM | POA: Diagnosis not present

## 2022-08-19 LAB — POCT URINALYSIS DIPSTICK
Bilirubin, UA: NEGATIVE
Blood, UA: NEGATIVE
Glucose, UA: NEGATIVE
Ketones, UA: NEGATIVE
Leukocytes, UA: NEGATIVE
Nitrite, UA: NEGATIVE
Protein, UA: NEGATIVE
Spec Grav, UA: 1.01 (ref 1.010–1.025)
Urobilinogen, UA: 0.2 E.U./dL
pH, UA: 7.5 (ref 5.0–8.0)

## 2022-08-19 NOTE — Progress Notes (Signed)
Subjective:    Patient ID: Julia Solis, female    DOB: December 09, 1950, 71 y.o.   MRN: [id number]  HPI 71 year old Female seen for Medicare wellness and evaluation of medical issues. She saw allergist at [place].  Allergist not think she had significant allergies.  He referred her to Dr. [name], ENT physician. Dr. [name] saw her and put her on famotidine and Flonase. Is to have sleep study to see if she needs CPAP device  Has ortho issues and seeing [name].  Pain in she is SI joint (right side). Discussion about meloxicam versus Tylenol.  Uses and properties of each discussed.  See note per [name].  Has tried some physical therapy but it was a little too much.  Did have right SI joint injection with relief.  Colonoscopy is up todate.  Mammogram due in February 2024.  Had bone density study in February 2023.  Has osteoporosis with T score -2.6  Vaccines reviewed.  She has a history of pulmonary sarcoidosis followed at [facility] by pulmonologist.  She reports no significant shortness of breath.  She has had extensive evaluation.  She had bone biopsy showing granulomatous inflammation.  She has known since the 1990s that she had an abnormal chest x-ray.  History of mild pulmonary hypertension.  Was diagnosed in 2019 after having a renal stone and CT suggested lymphadenopathy.  Had PET scan which was abnormal and subsequently diagnosed with sarcoidosis.  Had lithotripsy for treatment of renal stone in 2019.  History of lumbar disc disease status post lumbar surgery by Dr. [name] in 1997.  Has some chronic numbness left foot from that surgery.  Additional past medical history: Ectopic pregnancy 1980, benign left breast biopsy 1993.  C-sections in 1992 in 1995.  Ganglion cyst removed from wrist in 1975.  Tonsillectomy 1957.  Had brow lift in 2008.  She has a remote history of ovarian cancer 1C grade 3 clear-cell status post debulking surgery March  2011 by Dr. [name].  Had total abdominal hysterectomy, BSO, bilateral pelvic node dissection.  Had aortic node sampling, omentectomy, appendectomy and biopsies.  Lymph nodes were negative for malignancy.  Symptoms started February 2011 with abdominal gas and bloating.  CT of the chest at that time showed tiny nodules thought to be benign but in retrospect could have been sarcoidosis.  Social history: Divorced.  Has 2 adult daughters.  Does not smoke but did smoke from the ages of 76 through 80.  Social alcohol consumption.  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  Family history: Father died at age 68 with prostate cancer.  Brother died at age 28 of an AVM rupture.  1 sister in good health.  Review of Systems some issues occasionally with insomnia.  Has tried Ambien and lorazepam in the past.  Most recently prescribed lorazepam 1 mg at bedtime.     Objective:   Physical Exam  Vital signs reviewed.  Blood pressure 102/70 pulse 84 temperature 98.3 degrees pulse oximetry 98% weight 184 pounds height 5 feet 4 inches BMI 31.58  Skin: Warm and dry.  No cervical adenopathy, thyromegaly or carotid bruits.  She wears bilateral hearing aids.  TMs clear.  Neck is supple.  Chest clear.  Cardiac exam: Regular rate and rhythm without ectopy.  Breasts are without masses.  Abdomen is soft nondistended without hepatosplenomegaly masses or tenderness.  No lower extremity pitting edema.  Pulses intact in feet.  Brief neurological  exam no gross focal deficits.      Assessment & Plan:   BP has been running a bit low. She will try stopping the losartan and monitor BP off losartan.  History of pulmonary sarcoid followed at [facility]  Remote history of ovarian cancer in 2011  Pure hypercholesterolemia treated with low-dose Crestor  Insomnia treated with as needed lorazepam  Musculoskeletal pain treated with Mobic daily if needed  Constipation treated with  MiraLAX   Subjective:   Patient presents for Medicare Annual/Subsequent preventive examination.  Review Past Medical/Family/Social:see above   Risk Factors  Current exercise habits: regular, light Dietary issues discussed: low carb low fat  Cardiac risk factors: hyperlipidemia  Depression Screen  (Note: if answer to either of the following is "Yes", a more complete depression screening is indicated)   Over the past two weeks, have you felt down, depressed or hopeless? No  Over the past two weeks, have you felt little interest or pleasure in doing things? No Have you lost interest or pleasure in daily life? No Do you often feel hopeless? No Do you cry easily over simple problems? No   Activities of Daily Living  In your present state of health, do you have any difficulty performing the following activities?:   Driving? No  Managing money? No  Feeding yourself? No  Getting from bed to chair? No  Climbing a flight of stairs? No  Preparing food and eating?: No  Bathing or showering? No  Getting dressed: No  Getting to the toilet? No  Using the toilet:No  Moving around from place to place: No  In the past year have you fallen or had a near fall?:No  Are you sexually active? No  Do you have more than one partner? No   Hearing Difficulties: has hearing aids Do you often ask people to speak up or repeat themselves? sometimes Do you experience ringing or noises in your ears? No  Do you have difficulty understanding soft or whispered voices? sometimes Do you feel that you have a problem with memory? No Do you often misplace items? No    Home Safety:  Do you have a smoke alarm at your residence? Yes Do you have grab bars in the bathroom?no Do you have throw rugs in your house? yes   Cognitive Testing  Alert? Yes Normal Appearance?Yes  Oriented to person? Yes Place? Yes  Time? Yes  Recall of three objects? Yes  Can perform simple calculations? Yes  Displays  appropriate judgment?Yes  Can read the correct time from a watch face?Yes   List the Names of Other Physician/Practitioners you currently use:  See referral list for the physicians patient is currently seeing.     Review of Systems: no chest pain, depression, bowel issues other than hx of constipation, or significant headaches   Objective:     General appearance: Appears stated age Head: Normocephalic, without obvious abnormality, atraumatic  Eyes: conj clear, EOMi PEERLA  Ears: normal TM's and external ear canals both ears  Nose: Nares normal. Septum midline. Mucosa normal. No drainage or sinus tenderness.  Throat: lips, mucosa, and tongue normal; teeth and gums normal  Neck: no adenopathy, no carotid bruit, no JVD, supple, symmetrical, trachea midline and thyroid not enlarged, symmetric, no tenderness/mass/nodules  No CVA tenderness.  Lungs: clear to auscultation bilaterally  Breasts: normal appearance, no masses or tenderness  Heart: regular rate and rhythm, S1, S2 normal, no murmur, click, rub or gallop  Abdomen: soft,  non-tender; bowel sounds normal; no masses, no organomegaly  Musculoskeletal: ROM normal in all joints, no crepitus, no deformity, Normal muscle strengthen. Back  is symmetric, no curvature. Skin: Skin color, texture, turgor normal. No rashes or lesions  Lymph nodes: Cervical, supraclavicular, and axillary nodes normal.  Neurologic: CN 2 -12 Normal, Normal symmetric reflexes. Normal coordination and gait  Psych: Alert & Oriented x 3, Mood appear stable.    Assessment:    Annual wellness medicare exam   Plan:    During the course of the visit the patient was educated and counseled about appropriate screening and preventive services including:   Immunizations discussed.  Mammogram and colonoscopy up to date     Patient Instructions (the written plan) was given to the patient.  Medicare Attestation  I have personally reviewed:  The patient's medical  and social history  Their use of alcohol, tobacco or illicit drugs  Their current medications and supplements  The patient's functional ability including ADLs,fall risks, home safety risks, cognitive, and hearing and visual impairment  Diet and physical activities  Evidence for depression or mood disorders  The patient's weight, height, BMI, and visual acuity have been recorded in the chart. I have made referrals, counseling, and provided education to the patient based on review of the above and I have provided the patient with a written personalized care plan for preventive services.

## 2022-08-21 DIAGNOSIS — M533 Sacrococcygeal disorders, not elsewhere classified: Secondary | ICD-10-CM | POA: Diagnosis not present

## 2022-08-21 DIAGNOSIS — M545 Low back pain, unspecified: Secondary | ICD-10-CM | POA: Diagnosis not present

## 2022-08-21 NOTE — Progress Notes (Signed)
Remote pacemaker transmission.   

## 2022-08-25 ENCOUNTER — Other Ambulatory Visit: Payer: Self-pay | Admitting: Orthopedic Surgery

## 2022-08-25 DIAGNOSIS — M545 Low back pain, unspecified: Secondary | ICD-10-CM

## 2022-08-30 NOTE — Patient Instructions (Addendum)
It was a pleasure to see you today. Continue current meds. Labs are stable. Vaccines discussed. RTC in one year or as needed.

## 2022-09-02 ENCOUNTER — Other Ambulatory Visit: Payer: PPO

## 2022-09-15 DIAGNOSIS — D869 Sarcoidosis, unspecified: Secondary | ICD-10-CM | POA: Diagnosis not present

## 2022-09-15 DIAGNOSIS — I272 Pulmonary hypertension, unspecified: Secondary | ICD-10-CM | POA: Diagnosis not present

## 2022-09-15 DIAGNOSIS — G4733 Obstructive sleep apnea (adult) (pediatric): Secondary | ICD-10-CM | POA: Diagnosis not present

## 2022-09-15 DIAGNOSIS — G4736 Sleep related hypoventilation in conditions classified elsewhere: Secondary | ICD-10-CM | POA: Diagnosis not present

## 2022-09-18 DIAGNOSIS — G4733 Obstructive sleep apnea (adult) (pediatric): Secondary | ICD-10-CM | POA: Diagnosis not present

## 2022-10-02 ENCOUNTER — Other Ambulatory Visit: Payer: PPO

## 2022-10-02 ENCOUNTER — Ambulatory Visit
Admission: RE | Admit: 2022-10-02 | Discharge: 2022-10-02 | Disposition: A | Discharge status: Home / Self Care | Payer: PPO | Source: Ambulatory Visit | Attending: Orthopedic Surgery | Admitting: Orthopedic Surgery

## 2022-10-02 DIAGNOSIS — M461 Sacroiliitis, not elsewhere classified: Secondary | ICD-10-CM | POA: Diagnosis not present

## 2022-10-02 DIAGNOSIS — M545 Low back pain, unspecified: Secondary | ICD-10-CM

## 2022-10-02 MED ORDER — METHYLPREDNISOLONE ACETATE 40 MG/ML INJ SUSP (RADIOLOG
120.0000 mg | Freq: Once | INTRAMUSCULAR | Status: AC
  Administered 2022-10-02: 120 mg via INTRA_ARTICULAR

## 2022-10-08 ENCOUNTER — Other Ambulatory Visit: Payer: Self-pay | Admitting: Internal Medicine

## 2022-10-09 ENCOUNTER — Other Ambulatory Visit: Payer: Self-pay | Admitting: Internal Medicine

## 2022-10-21 DIAGNOSIS — M25552 Pain in left hip: Secondary | ICD-10-CM | POA: Diagnosis not present

## 2022-10-23 DIAGNOSIS — M25552 Pain in left hip: Secondary | ICD-10-CM | POA: Diagnosis not present

## 2022-10-26 DIAGNOSIS — M25552 Pain in left hip: Secondary | ICD-10-CM | POA: Diagnosis not present

## 2022-10-29 DIAGNOSIS — M25552 Pain in left hip: Secondary | ICD-10-CM | POA: Diagnosis not present

## 2022-11-03 DIAGNOSIS — M25552 Pain in left hip: Secondary | ICD-10-CM | POA: Diagnosis not present

## 2022-11-04 ENCOUNTER — Telehealth: Payer: Self-pay | Admitting: Cardiovascular Disease

## 2022-11-04 NOTE — Telephone Encounter (Signed)
Returned call to patient and shared with her Pharm D's reply:  Ok to take Fosamax. Only interaction with her other meds is with her meloxicam - taking both together can increase risk of GI ulcer or nephrotoxicity. Her renal function is normal, would make sure she takes her meloxicam with food.    Patient verbalized understanding and expressed appreciation for follow-up.

## 2022-11-04 NOTE — Telephone Encounter (Signed)
Pt c/o medication issue:  1. Name of Medication: Fosamax  2. How are you currently taking this medication (dosage and times per day)? Not currently taking   3. Are you having a reaction (difficulty breathing--STAT)? No   4. What is your medication issue? Patient is wanting to know if it is okay to start this with her heart condition.

## 2022-11-04 NOTE — Telephone Encounter (Signed)
Patient was prescribed Fosamax 70 mg, she was concerned if this medication is safe to take on a weekly basis. Currently asymptomatic, will send this message to MD and RN.

## 2022-11-04 NOTE — Telephone Encounter (Signed)
Ok to take Fosamax. Only interaction with her other meds is with her meloxicam - taking both together can increase risk of GI ulcer or nephrotoxicity. Her renal function is normal, would make sure she takes her meloxicam with food.

## 2022-11-05 DIAGNOSIS — M25552 Pain in left hip: Secondary | ICD-10-CM | POA: Diagnosis not present

## 2022-11-06 ENCOUNTER — Telehealth: Payer: Self-pay

## 2022-11-06 ENCOUNTER — Telehealth (INDEPENDENT_AMBULATORY_CARE_PROVIDER_SITE_OTHER): Payer: PPO | Admitting: Internal Medicine

## 2022-11-06 ENCOUNTER — Encounter: Payer: Self-pay | Admitting: Internal Medicine

## 2022-11-06 VITALS — Temp 99.8°F

## 2022-11-06 DIAGNOSIS — U071 COVID-19: Secondary | ICD-10-CM

## 2022-11-06 MED ORDER — NIRMATRELVIR/RITONAVIR (PAXLOVID)TABLET: 3.0000 | ORAL_TABLET | Freq: Two times a day (BID) | ORAL | 0 refills | Status: AC

## 2022-11-06 MED ORDER — BENZONATATE 100 MG PO CAPS: 100.0000 mg | ORAL_CAPSULE | Freq: Three times a day (TID) | ORAL | 0 refills | Status: DC | PRN

## 2022-11-06 NOTE — Patient Instructions (Addendum)
Paxlovid regular strength prescribed for patient today.  Stay well-hydrated.  Quarantine at home for 5 days.  Monitor pulse oximetry.  Call if symptoms worsen or not improving.  Also sent in Tessalon Perles to take 100 mg 3 times daily as needed for cough

## 2022-11-06 NOTE — Telephone Encounter (Signed)
Patient called stating she has tested positive for Covid this morning and would like an Rx for Paxlovid.  She is scheduled for a virual visit @ 12:30

## 2022-11-06 NOTE — Progress Notes (Signed)
   Subjective:    Patient ID: Julia Solis, female    DOB: [birth year], 72 y.o.   MRN: [id number]  HPI 72 year-old Female with history of Sarcoidosis and pulmonary hypertension seen today by [employer].  Patient called reports she tested positive for COVID-19 today.  I records report a COVID booster in April 2023.  She had flu vaccine September 2023.  She is at her home and I am at my office.  She is agreeable to visit in this format today.    She has malaise and fatigue.  No nausea vomiting or diarrhea. Additional history: Remote history of ovarian cancer.  History of lithotripsy for renal stone 2019.  History of osteoporosis.  History of sick sinus syndrome followed by cardiology.  Has pacemaker.  Review of Systems-basically has flulike symptoms with malaise and fatigue     Objective:   Physical Exam  She is seen virtually in no acute distress.  She reports her temperature to be 99.8 degrees and pulse oximetry 97% on room air      Assessment & Plan:  Acute COVID-19 virus infection  Sarcoidosis  Pulmonary hypertension  History of sick sinus syndrome with pacemaker  Plan: Patient agreeable to taking Paxlovid.  Estimated GFR is 94 cc/min.  I am for prescribing  regular strength Paxlovid.  She is to quarantine at home for 5 days.  Rest, stay well-hydrated and monitor pulse oximetry.  Call if symptoms worsen.  Time spent with video visit is 15 minutes

## 2022-11-12 ENCOUNTER — Ambulatory Visit (INDEPENDENT_AMBULATORY_CARE_PROVIDER_SITE_OTHER): Payer: PPO

## 2022-11-12 DIAGNOSIS — I495 Sick sinus syndrome: Secondary | ICD-10-CM | POA: Diagnosis not present

## 2022-11-13 LAB — CUP PACEART REMOTE DEVICE CHECK
Date Time Interrogation Session: 20240209075005
Implantable Lead Connection Status: 753985
Implantable Lead Connection Status: 753985
Implantable Lead Implant Date: 20220201
Implantable Lead Implant Date: 20220201
Implantable Lead Location: 753859
Implantable Lead Location: 753860
Implantable Pulse Generator Implant Date: 20220201
Pulse Gen Model: 2272
Pulse Gen Serial Number: 3896658

## 2022-11-19 ENCOUNTER — Encounter: Payer: Self-pay | Admitting: Internal Medicine

## 2022-11-19 ENCOUNTER — Ambulatory Visit (INDEPENDENT_AMBULATORY_CARE_PROVIDER_SITE_OTHER): Payer: PPO | Admitting: Internal Medicine

## 2022-11-19 VITALS — BP 138/74 | HR 79 | Temp 98.2°F

## 2022-11-19 DIAGNOSIS — D869 Sarcoidosis, unspecified: Secondary | ICD-10-CM

## 2022-11-19 DIAGNOSIS — L309 Dermatitis, unspecified: Secondary | ICD-10-CM | POA: Diagnosis not present

## 2022-11-19 DIAGNOSIS — Z8679 Personal history of other diseases of the circulatory system: Secondary | ICD-10-CM | POA: Diagnosis not present

## 2022-11-19 DIAGNOSIS — Z872 Personal history of diseases of the skin and subcutaneous tissue: Secondary | ICD-10-CM | POA: Diagnosis not present

## 2022-11-19 DIAGNOSIS — J22 Unspecified acute lower respiratory infection: Secondary | ICD-10-CM | POA: Diagnosis not present

## 2022-11-19 DIAGNOSIS — D860 Sarcoidosis of lung: Secondary | ICD-10-CM | POA: Diagnosis not present

## 2022-11-19 DIAGNOSIS — U071 COVID-19: Secondary | ICD-10-CM

## 2022-11-19 DIAGNOSIS — F419 Anxiety disorder, unspecified: Secondary | ICD-10-CM

## 2022-11-19 MED ORDER — METHYLPREDNISOLONE 4 MG PO TABS: 4.0000 mg | ORAL_TABLET | Freq: Every day | ORAL | 1 refills | Status: DC

## 2022-11-19 MED ORDER — CLARITHROMYCIN 500 MG PO TABS: 500.0000 mg | ORAL_TABLET | Freq: Two times a day (BID) | ORAL | 0 refills | Status: DC

## 2022-11-19 MED ORDER — TRIAMCINOLONE ACETONIDE 0.1 % EX CREA: 1.0000 | TOPICAL_CREAM | Freq: Two times a day (BID) | CUTANEOUS | 1 refills | Status: DC

## 2022-11-19 NOTE — Telephone Encounter (Signed)
Julia Solis called and said yesterday she had low grade fever and chills, thinks she might be having a rebound of COVID. She original tested positive on 11/06/2022.  She also has a rash on her back, that is down on her buttocks that comes around to her torso, it is itching. She states it has been there since October, but is really bad right now and benadryl is not working. I let her know she will probably need to come in for an office visit so ou can see the rash.

## 2022-11-19 NOTE — Progress Notes (Signed)
Patient Care Team: Elby Showers, MD as PCP - General (Internal Medicine) Mealor, Yetta Barre, MD as Consulting Physician (Cardiology)  Visit Date: 11/19/22  Subjective:    Patient ID: Julia Solis , Female   DOB: [birth year], 72 y.o.    MRN: [id number]   72 y.o. Female presents today for cough, congestion, rash. Patient has a past medical history of eczema, sarcoidosis and pulmonary hypertension.  Reports an itchy rash on her back, buttocks, and chest since seen here on 07/24/22. Previously treated with Medrol and triamcinolone, which helped. Requesting a refill of Triamcinolone cream and Medrol. Believes rash is worse since she contracted Covid-19 in 2/24. Taking Benedryl with no relief. Reports a history of intermittent rashes on her legs. Followed by her dermatologist, Dr. Amy Martinique.  Reports recurrent Covid-19 symptoms since 11/05/22 including cough and congestion. Previously treated with regular strength Paxlovid, Tessalon Perles. Denies taking any immunosuppressants. Reports reduced appetite due to symptoms.   Past Medical History:  Diagnosis Date   Cancer ([place]) 2011   ovarian   Complication of anesthesia    GERD (gastroesophageal reflux disease)    Hearing aid worn    both ears   History of chemotherapy 2011   History of hiatal hernia    History of kidney stones    Hypertension    Neuromuscular disorder ([facility])    left foot numbness since  eback surgery   Osteoporosis    PONV (postoperative nausea and vomiting)    Presence of permanent cardiac pacemaker    Sarcoidosis    in remission sees pumonary  once a year dr bellinger lov 08-21-2020 [facility]   Sick sinus syndrome ([place])    Wears glasses      Family History  Problem Relation Age of Onset   Hypertension Mother    Prostate cancer Father 27   Cancer Father    Hypertension Father    Stroke Father    Colon polyps Sister    Colon cancer Maternal Grandmother        dx in her 74s   Heart disease  Maternal Grandfather    Colon cancer Paternal Grandmother    Heart disease Paternal Grandfather    Breast cancer Paternal Aunt 68   Colon cancer Paternal Aunt        dx in her 34s   Melanoma Cousin 67   Breast cancer Cousin 74       multiple cousins   Colon polyps Cousin    Breast cancer Cousin 24   Breast cancer Cousin        father's maternal 1st cousin   Breast cancer Cousin        Father's first cousin's daughter   Cancer Other        maternal great uncle with Cancer - NOS   Cancer Other        maternal great grandmother with either colon or stomach cancer   Breast cancer Other        fathers maternal aunt    Social History   Social History Narrative   Not on file      Review of Systems  Constitutional:  Positive for chills. Negative for fever and malaise/fatigue.  HENT:  Positive for congestion.   Eyes:  Negative for blurred vision.  Respiratory:  Positive for cough. Negative for shortness of breath.   Cardiovascular:  Negative for chest pain, palpitations and leg swelling.  Gastrointestinal:  Negative for vomiting.  Musculoskeletal:  Negative for  back pain.  Skin:  Positive for itching and rash (Back, buttoks, chest).  Neurological:  Negative for loss of consciousness and headaches.        Objective:   Vitals: BP 138/74   Pulse 79   Temp 98.2 F (36.8 C) (Tympanic)   SpO2 98%    Physical Exam Vitals and nursing note reviewed.  Constitutional:      General: She is not in acute distress.    Appearance: Normal appearance. She is not toxic-appearing.  HENT:     Head: Normocephalic and atraumatic.  Pulmonary:     Effort: Pulmonary effort is normal.     Comments: Fine rales in right lung. Skin:    General: Skin is warm and dry.     Findings: Erythema and rash present. Rash is macular and papular.     Comments: Macular, papular, erythematous rash along her back, both buttocks, anterior trunk, under bilateral breasts.  Neurological:     Mental  Status: She is alert and oriented to person, place, and time. Mental status is at baseline.  Psychiatric:        Mood and Affect: Mood normal.        Behavior: Behavior normal.        Thought Content: Thought content normal.        Judgment: Judgment normal.       Results:   Studies obtained and personally reviewed by me:   Labs:       Component Value Date/Time   NA 142 08/17/2022 1023   NA 144 10/28/2020 1636   K 4.5 08/17/2022 1023   CL 108 08/17/2022 1023   CO2 29 08/17/2022 1023   GLUCOSE 96 08/17/2022 1023   BUN 15 08/17/2022 1023   BUN 16 10/28/2020 1636   CREATININE 0.67 08/17/2022 1023   CALCIUM 8.9 08/17/2022 1023   PROT 5.8 (L) 08/17/2022 1023   ALBUMIN 4.1 04/12/2017 1036   AST 13 08/17/2022 1023   ALT 5 (L) 08/17/2022 1023   ALKPHOS 98 04/12/2017 1036   BILITOT 0.4 08/17/2022 1023   GFRNONAA 81 10/28/2020 1636   GFRNONAA 82 07/11/2020 0949   GFRAA 93 10/28/2020 1636   GFRAA 95 07/11/2020 0949     Lab Results  Component Value Date   WBC 6.4 08/17/2022   HGB 12.6 08/17/2022   HCT 38.4 08/17/2022   MCV 86.5 08/17/2022   PLT 199 08/17/2022    Lab Results  Component Value Date   CHOL 180 08/17/2022   HDL 53 08/17/2022   LDLCALC 108 (H) 08/17/2022   TRIG 99 08/17/2022   CHOLHDL 3.4 08/17/2022    No results found for: "HGBA1C"   Lab Results  Component Value Date   TSH 2.58 08/17/2022      Assessment & Plan:   Acute Dermatitis: Has Itchy rash on her back, buttocks, and chest since seen here on 07/24/22. Refilled Medrol 4 mg daily, Kenalog 0.1% twice daily. Followed by her dermatologist, Dr. Amy Martinique.  Acute Lower Respiratory Infection, Covid-19: Has had recurrent Covid-19 symptoms since 11/05/22 including cough and congestion. Denies taking any immunosuppressants. Prescribed Biaxin 500 mg twice daily. Chest Xr ordered. Advised to contact us if symptoms do not improve by 11/26/22.    I,Alexander Ruley,acting as a Education administrator for Elby Showers,  MD.,have documented all relevant documentation on the behalf of Elby Showers, MD,as directed by  Elby Showers, MD while in the presence of Elby Showers, MD.   Martie Lee  Gerrianne Scale, MD, have reviewed all documentation for this visit. The documentation on 11/30/22 for the exam, diagnosis, procedures, and orders are all accurate and complete.

## 2022-11-19 NOTE — Telephone Encounter (Signed)
Called patient to schedule appointment she stated she had took another home test and it was positive for COVID today, I let her know she still needs to come in for office visit to be treated. She is scheduled for 3:30

## 2022-11-19 NOTE — Progress Notes (Signed)
   Subjective:    Patient ID: Julia Solis, female    DOB: [birth year], 72 y.o.   MRN: [id number]  HPI  72 year old Female seen for follow up s/p Covid-19 virus infection.  She had a visit on February 2 as she had tested positive for COVID-19.  She has a history of sarcoidosis and pulmonary hypertension.  Last had COVID booster April 2023.  She was having malaise and fatigue.  No nausea, diarrhea or vomiting at that time.  She was placed on Paxlovid and Tessalon Perles.  History of sick sinus syndrome followed by Cardiology and has pacemaker.  Remote history of ovarian cancer.  Patient says that she has been slow to improve.  Has been worried.  Has concerns about chest congestion.  An x-ray was ordered today but could not be done due to machines being down at x-ray facility.  Addendum: Chest x-ray done on 2/16 she does some chronic distortion/scarring in the upper lobes but no pneumonia.  No pleural effusion.  No pulmonary edema.  Has been allergy tested in the remote past at [facility] allergy and they did not think she had significant allergies.  She saw Dr. [name] ENT physician in the remote past who felt that she might have sleep study.  He placed her on famotidine and Flonase.  She had a home sleep study done through [facility] in December 2023 and was diagnosed with severe obstructive sleep apnea.  History of mild pulmonary hypertension.  Pulmonary sarcoidosis is followed at [facility].  In October 2023 was treated for a rash on her back that was felt to be a contact dermatitis.  It was not a rash typical of shingles and she was given a Medrol 4 mg Dosepak to take in tapering course.  She still has some concerns about this and Medrol prescription was re-prescribed today    Review of Systems has some anxiety about persistent symptoms having been diagnosed with COVID recently     Objective:   Physical Exam  She is afebrile, blood pressure  138/74, pulse 79 temperature 98.2 degrees pulse oximetry 98%  Skin: Warm and dry.  No cervical adenopathy.  Pharynx is clear.  Neck supple.  Chest clear.      Assessment & Plan:  Recent COVID-19 virus infection with protracted symptoms  Plan: She will have chest x-ray.  Likely has a secondary bacterial infection and was given Biaxin 500 mg twice daily for 10 days.  Previously had been given Ladona Ridgel on February 2 for cough.  Also will take Medrol.  Addendum: Chest x-ray shows no pneumonia but scarring in upper lobes.  25 minutes spent with patient including chart review, extensive history taking and examining patient along with medical decision making.

## 2022-11-20 ENCOUNTER — Telehealth: Payer: Self-pay

## 2022-11-20 ENCOUNTER — Ambulatory Visit
Admission: RE | Admit: 2022-11-20 | Discharge: 2022-11-20 | Disposition: A | Discharge status: Home / Self Care | Payer: PPO | Source: Ambulatory Visit | Attending: Internal Medicine | Admitting: Internal Medicine

## 2022-11-20 DIAGNOSIS — J22 Unspecified acute lower respiratory infection: Secondary | ICD-10-CM

## 2022-11-20 DIAGNOSIS — R059 Cough, unspecified: Secondary | ICD-10-CM | POA: Diagnosis not present

## 2022-11-20 NOTE — Telephone Encounter (Signed)
Patient called stating she went to get chest X-ray and the machines are down, she was told to come back this afternoon and wants Dr. Renold Genta to know she will go back this afternoon and hopes results are back today.

## 2022-11-30 DIAGNOSIS — M25552 Pain in left hip: Secondary | ICD-10-CM | POA: Diagnosis not present

## 2022-11-30 NOTE — Patient Instructions (Addendum)
Patient is to have chest x-ray.  Take Biaxin 500 mg twice daily for 10 days.  May take Tessalon Perles for cough and may take Medrol as discussed.  Addendum: Chest x-ray shows no pneumonia but scarring in upper lobes.

## 2022-12-01 DIAGNOSIS — Z1231 Encounter for screening mammogram for malignant neoplasm of breast: Secondary | ICD-10-CM | POA: Diagnosis not present

## 2022-12-01 LAB — HM MAMMOGRAPHY

## 2022-12-02 DIAGNOSIS — G4733 Obstructive sleep apnea (adult) (pediatric): Secondary | ICD-10-CM | POA: Diagnosis not present

## 2022-12-02 DIAGNOSIS — M25552 Pain in left hip: Secondary | ICD-10-CM | POA: Diagnosis not present

## 2022-12-03 ENCOUNTER — Encounter: Payer: Self-pay | Admitting: Internal Medicine

## 2022-12-03 DIAGNOSIS — J343 Hypertrophy of nasal turbinates: Secondary | ICD-10-CM | POA: Diagnosis not present

## 2022-12-03 DIAGNOSIS — J310 Chronic rhinitis: Secondary | ICD-10-CM | POA: Diagnosis not present

## 2022-12-03 DIAGNOSIS — J342 Deviated nasal septum: Secondary | ICD-10-CM | POA: Diagnosis not present

## 2022-12-03 NOTE — Progress Notes (Signed)
Remote pacemaker transmission.   

## 2022-12-08 DIAGNOSIS — M25552 Pain in left hip: Secondary | ICD-10-CM | POA: Diagnosis not present

## 2022-12-10 DIAGNOSIS — M25552 Pain in left hip: Secondary | ICD-10-CM | POA: Diagnosis not present

## 2022-12-14 ENCOUNTER — Other Ambulatory Visit: Payer: Self-pay | Admitting: Internal Medicine

## 2022-12-14 DIAGNOSIS — M25552 Pain in left hip: Secondary | ICD-10-CM | POA: Diagnosis not present

## 2022-12-15 DIAGNOSIS — G4733 Obstructive sleep apnea (adult) (pediatric): Secondary | ICD-10-CM | POA: Diagnosis not present

## 2022-12-17 ENCOUNTER — Encounter: Payer: Self-pay | Admitting: Cardiovascular Disease

## 2022-12-17 DIAGNOSIS — M25552 Pain in left hip: Secondary | ICD-10-CM | POA: Diagnosis not present

## 2022-12-22 DIAGNOSIS — M25552 Pain in left hip: Secondary | ICD-10-CM | POA: Diagnosis not present

## 2022-12-24 DIAGNOSIS — M25552 Pain in left hip: Secondary | ICD-10-CM | POA: Diagnosis not present

## 2022-12-29 DIAGNOSIS — M65341 Trigger finger, right ring finger: Secondary | ICD-10-CM | POA: Diagnosis not present

## 2022-12-29 DIAGNOSIS — M65342 Trigger finger, left ring finger: Secondary | ICD-10-CM | POA: Diagnosis not present

## 2022-12-30 DIAGNOSIS — G4733 Obstructive sleep apnea (adult) (pediatric): Secondary | ICD-10-CM | POA: Diagnosis not present

## 2023-01-05 DIAGNOSIS — M25552 Pain in left hip: Secondary | ICD-10-CM | POA: Diagnosis not present

## 2023-01-06 DIAGNOSIS — L308 Other specified dermatitis: Secondary | ICD-10-CM | POA: Diagnosis not present

## 2023-01-06 DIAGNOSIS — L299 Pruritus, unspecified: Secondary | ICD-10-CM | POA: Diagnosis not present

## 2023-01-06 DIAGNOSIS — Z85828 Personal history of other malignant neoplasm of skin: Secondary | ICD-10-CM | POA: Diagnosis not present

## 2023-01-06 DIAGNOSIS — L300 Nummular dermatitis: Secondary | ICD-10-CM | POA: Diagnosis not present

## 2023-01-06 DIAGNOSIS — L309 Dermatitis, unspecified: Secondary | ICD-10-CM | POA: Diagnosis not present

## 2023-01-07 DIAGNOSIS — H52203 Unspecified astigmatism, bilateral: Secondary | ICD-10-CM | POA: Diagnosis not present

## 2023-01-07 DIAGNOSIS — H2513 Age-related nuclear cataract, bilateral: Secondary | ICD-10-CM | POA: Diagnosis not present

## 2023-01-07 DIAGNOSIS — M25552 Pain in left hip: Secondary | ICD-10-CM | POA: Diagnosis not present

## 2023-01-07 DIAGNOSIS — H349 Unspecified retinal vascular occlusion: Secondary | ICD-10-CM | POA: Diagnosis not present

## 2023-01-11 ENCOUNTER — Ambulatory Visit (INDEPENDENT_AMBULATORY_CARE_PROVIDER_SITE_OTHER): Payer: PPO | Admitting: Internal Medicine

## 2023-01-11 ENCOUNTER — Other Ambulatory Visit: Payer: Self-pay

## 2023-01-11 ENCOUNTER — Telehealth: Payer: Self-pay

## 2023-01-11 ENCOUNTER — Encounter: Payer: Self-pay | Admitting: Internal Medicine

## 2023-01-11 VITALS — BP 126/70 | HR 74 | Temp 98.0°F | Ht 64.0 in | Wt 184.0 lb

## 2023-01-11 DIAGNOSIS — H6692 Otitis media, unspecified, left ear: Secondary | ICD-10-CM

## 2023-01-11 DIAGNOSIS — R051 Acute cough: Secondary | ICD-10-CM | POA: Diagnosis not present

## 2023-01-11 DIAGNOSIS — J069 Acute upper respiratory infection, unspecified: Secondary | ICD-10-CM

## 2023-01-11 DIAGNOSIS — M19049 Primary osteoarthritis, unspecified hand: Secondary | ICD-10-CM | POA: Diagnosis not present

## 2023-01-11 MED ORDER — HYDROCODONE BIT-HOMATROP MBR 5-1.5 MG/5ML PO SOLN
5.0000 mL | Freq: Three times a day (TID) | ORAL | 0 refills | Status: DC | PRN
Start: 2023-01-11 — End: 2023-03-23

## 2023-01-11 MED ORDER — CEFTRIAXONE SODIUM 1 G IJ SOLR
1.0000 g | Freq: Once | INTRAMUSCULAR | Status: AC
Start: 2023-01-11 — End: 2023-01-11
  Administered 2023-01-11: 1 g via INTRAMUSCULAR

## 2023-01-11 NOTE — Telephone Encounter (Signed)
Patient called stating she has been coughing since Thursday and got worst over the weekend,  she has been taking Mucinex, benzonatate and Hydrocodone syrup and its not helping and would like to come in.  I scheduled her for 11:30

## 2023-01-11 NOTE — Progress Notes (Signed)
Patient Care Team: Margaree Mackintosh, MD as PCP - General (Internal Medicine) Mealor, Roberts Gaudy, MD as Consulting Physician (Cardiology)  Visit Date: 01/11/23  Subjective:    Patient ID: Julia Solis , Female   DOB: [birth year], 72 y.o.    MRN: [id number]   72 y.o. Female presents today for cough with green/yellow sputum, nasal and chest congestion since 01/04/23. Cough worsened 01/10/23. Taking Tessalon Perles, Mucinex, hycodan with inadequate relief.  Reports her rash has been biopsied and has follow-up with dermatologist 01/20/23 to remove stiches.  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 Has had some swelling in her hands recently.  Past Medical History:  Diagnosis Date   Cancer 2011   ovarian   Complication of anesthesia    GERD (gastroesophageal reflux disease)    Hearing aid worn    both ears   History of chemotherapy 2011   History of hiatal hernia    History of kidney stones    Hypertension    Neuromuscular disorder    left foot numbness since  eback surgery   Osteoporosis    PONV (postoperative nausea and vomiting)    Presence of permanent cardiac pacemaker    Sarcoidosis    in remission sees pumonary  once a year dr bellinger lov 08-21-2020 [facility]   Sick sinus syndrome    Wears glasses      Family History  Problem Relation Age of Onset   Hypertension Mother    Prostate cancer Father 62   Cancer Father    Hypertension Father    Stroke Father    Colon polyps Sister    Colon cancer Maternal Grandmother        dx in her 32s   Heart disease Maternal Grandfather    Colon cancer Paternal Grandmother    Heart disease Paternal Grandfather    Breast cancer Paternal Aunt 32   Colon cancer Paternal Aunt        dx in her 66s   Melanoma Cousin 77   Breast cancer Cousin 33       multiple cousins   Colon polyps Cousin    Breast cancer Cousin 59   Breast cancer Cousin        father's maternal 1st cousin   Breast cancer Cousin        Father's first  cousin's daughter   Cancer Other        maternal great uncle with Cancer - NOS   Cancer Other        maternal great grandmother with either colon or stomach cancer   Breast cancer Other        fathers maternal aunt    Social History   Social History Narrative   Not on file      Review of Systems  Constitutional:  Negative for fever and malaise/fatigue.  HENT:  Positive for congestion (Head, chest).   Eyes:  Negative for blurred vision.  Respiratory:  Positive for cough and sputum production (Green, yellow). Negative for shortness of breath.   Cardiovascular:  Negative for chest pain, palpitations and leg swelling.  Gastrointestinal:  Negative for vomiting.  Musculoskeletal:  Positive for joint pain (Hands, swelling). Negative for back pain.  Skin:  Negative for rash.  Neurological:  Negative for loss of consciousness and headaches.        Objective:   Vitals: BP 126/70   Pulse 74   Temp 98 F (36.7 C) (Tympanic)   Ht 5\' 4"  (1.626  m)   Wt 184 lb (83.5 kg)   SpO2 98%   BMI 31.58 kg/m    Physical Exam Vitals and nursing note reviewed.  Constitutional:      General: She is not in acute distress.    Appearance: Normal appearance. She is not toxic-appearing.  HENT:     Head: Normocephalic and atraumatic.     Right Ear: Hearing, tympanic membrane, ear canal and external ear normal.     Left Ear: Hearing, ear canal and external ear normal. Tympanic membrane is erythematous.     Ears:     Comments: Left TM full, pink.    Mouth/Throat:     Pharynx: No oropharyngeal exudate.     Comments: Pharynx slightly injected. Pulmonary:     Effort: Pulmonary effort is normal. No respiratory distress.     Breath sounds: Normal breath sounds. No wheezing or rales.  Skin:    General: Skin is warm and dry.  Neurological:     Mental Status: She is alert and oriented to person, place, and time. Mental status is at baseline.  Psychiatric:        Mood and Affect: Mood normal.         Behavior: Behavior normal.        Thought Content: Thought content normal.        Judgment: Judgment normal.       Results:   Studies obtained and personally reviewed by me:   Labs:       Component Value Date/Time   NA 142 08/17/2022 1023   NA 144 10/28/2020 1636   K 4.5 08/17/2022 1023   CL 108 08/17/2022 1023   CO2 29 08/17/2022 1023   GLUCOSE 96 08/17/2022 1023   BUN 15 08/17/2022 1023   BUN 16 10/28/2020 1636   CREATININE 0.67 08/17/2022 1023   CALCIUM 8.9 08/17/2022 1023   PROT 5.8 (L) 08/17/2022 1023   ALBUMIN 4.1 04/12/2017 1036   AST 13 08/17/2022 1023   ALT 5 (L) 08/17/2022 1023   ALKPHOS 98 04/12/2017 1036   BILITOT 0.4 08/17/2022 1023   GFRNONAA 81 10/28/2020 1636   GFRNONAA 82 07/11/2020 0949   GFRAA 93 10/28/2020 1636   GFRAA 95 07/11/2020 0949     Lab Results  Component Value Date   WBC 6.4 08/17/2022   HGB 12.6 08/17/2022   HCT 38.4 08/17/2022   MCV 86.5 08/17/2022   PLT 199 08/17/2022    Lab Results  Component Value Date   CHOL 180 08/17/2022   HDL 53 08/17/2022   LDLCALC 108 (H) 08/17/2022   TRIG 99 08/17/2022   CHOLHDL 3.4 08/17/2022    No results found for: "HGBA1C"   Lab Results  Component Value Date   TSH 2.58 08/17/2022      Assessment & Plan:   Upper respiratory infection, left otitis media: prescribed Hycodan 5 mL every 8 hours as needed for cough. Administered Rocephin 1 g IM. Take Biaxin 500 mg twice daily for 10 days.  Hand arthritis: ordered rheumatoid factor, sed rate, ANA, CBC with Diff/Plat, CMET, cyclic citrul peptide antibody, IgG.  NOTE: Has low titer positive ANA fine speckled pattern. When she is better, we can order additional Rheumatology labs if symptoms persist .Does not have Rheumatoid arthritis based on these lab results.Needs Xrays of hands if symptoms persist.   I,Alexander Ruley,acting as a scribe for Margaree MackintoshMary J Arlethia Basso, MD.,have documented all relevant documentation on the behalf of Margaree MackintoshMary J Amanee Iacovelli,  MD,as directed by  Margaree Mackintosh, MD while in the presence of Margaree Mackintosh, MD.   I, Margaree Mackintosh, MD, have reviewed all documentation for this visit. The documentation on 01/24/23 for the exam, diagnosis, procedures, and orders are all accurate and complete.

## 2023-01-12 LAB — COMPLETE METABOLIC PANEL WITH GFR
AG Ratio: 1.8 (calc) (ref 1.0–2.5)
ALT: 7 U/L (ref 6–29)
CO2: 28 mmol/L (ref 20–32)
Chloride: 106 mmol/L (ref 98–110)
Creat: 0.78 mg/dL (ref 0.60–1.00)
Globulin: 1.9 g/dL (calc) (ref 1.9–3.7)
Potassium: 4.3 mmol/L (ref 3.5–5.3)
Sodium: 141 mmol/L (ref 135–146)
Total Bilirubin: 0.3 mg/dL (ref 0.2–1.2)
Total Protein: 5.3 g/dL — ABNORMAL LOW (ref 6.1–8.1)
eGFR: 82 mL/min/{1.73_m2} (ref >=60)

## 2023-01-13 ENCOUNTER — Telehealth: Payer: Self-pay

## 2023-01-13 LAB — CBC WITH DIFFERENTIAL/PLATELET
Absolute Monocytes: 826 cells/uL (ref 200–950)
Basophils Absolute: 20 cells/uL (ref 0–200)
Basophils Relative: 0.2 %
Eosinophils Absolute: 500 cells/uL (ref 15–500)
Eosinophils Relative: 4.9 %
HCT: 37.5 % (ref 35.0–45.0)
Hemoglobin: 12.3 g/dL (ref 11.7–15.5)
Lymphs Abs: 1408 cells/uL (ref 850–3900)
MCH: 28.1 pg (ref 27.0–33.0)
MCHC: 32.8 g/dL (ref 32.0–36.0)
MCV: 85.8 fL (ref 80.0–100.0)
MPV: 10.8 fL (ref 7.5–12.5)
Monocytes Relative: 8.1 %
Neutro Abs: 7446 cells/uL (ref 1500–7800)
Neutrophils Relative %: 73 %
Platelets: 201 10*3/uL (ref 140–400)
RBC: 4.37 10*6/uL (ref 3.80–5.10)
RDW: 13.9 % (ref 11.0–15.0)
Total Lymphocyte: 13.8 %
WBC: 10.2 10*3/uL (ref 3.8–10.8)

## 2023-01-13 LAB — COMPLETE METABOLIC PANEL WITH GFR
AST: 14 U/L (ref 10–35)
Albumin: 3.4 g/dL — ABNORMAL LOW (ref 3.6–5.1)
Alkaline phosphatase (APISO): 66 U/L (ref 37–153)
BUN: 17 mg/dL (ref 7–25)
Calcium: 8.6 mg/dL (ref 8.6–10.4)
Glucose, Bld: 98 mg/dL (ref 65–99)

## 2023-01-13 LAB — ANTI-NUCLEAR AB-TITER (ANA TITER): ANA Titer 1: 1:40 {titer} — ABNORMAL HIGH

## 2023-01-13 LAB — SEDIMENTATION RATE: Sed Rate: 25 mm/h (ref 0–30)

## 2023-01-13 LAB — RHEUMATOID FACTOR: Rheumatoid fact SerPl-aCnc: <14 IU/mL (ref <14)

## 2023-01-13 LAB — ANA: Anti Nuclear Antibody (ANA): POSITIVE — AB

## 2023-01-13 LAB — CYCLIC CITRUL PEPTIDE ANTIBODY, IGG: Cyclic Citrullin Peptide Ab: <16 UNITS

## 2023-01-13 NOTE — Telephone Encounter (Addendum)
Patient asking questions about lab results that were done during a sick visit.  Patient concerned with protein and albumin levels trending downward and has read that this could be contributing to the inflammation and other health issues.  Would you like her to schedule an office visit or is an virtual visit ok

## 2023-01-15 DIAGNOSIS — G4733 Obstructive sleep apnea (adult) (pediatric): Secondary | ICD-10-CM | POA: Diagnosis not present

## 2023-01-15 NOTE — Telephone Encounter (Signed)
Spoke with patient and she is feeling better and has upcoming appts with other doctors.  She stated if Dr. Lenord Fellers is not concerned she won't worry about lab results and does not want to schedule at this time.

## 2023-01-20 DIAGNOSIS — L308 Other specified dermatitis: Secondary | ICD-10-CM | POA: Diagnosis not present

## 2023-01-20 DIAGNOSIS — Z85828 Personal history of other malignant neoplasm of skin: Secondary | ICD-10-CM | POA: Diagnosis not present

## 2023-01-20 DIAGNOSIS — L2089 Other atopic dermatitis: Secondary | ICD-10-CM | POA: Diagnosis not present

## 2023-01-20 DIAGNOSIS — L309 Dermatitis, unspecified: Secondary | ICD-10-CM | POA: Diagnosis not present

## 2023-01-24 MED ORDER — MELOXICAM 15 MG PO TABS: 15.0000 mg | ORAL_TABLET | ORAL | 0 refills | Status: DC | PRN

## 2023-01-24 NOTE — Patient Instructions (Addendum)
She can have further testing for hand arthritis if symptoms persist after she is over this acute illness. Take Biaxin 500 mg twice daily for 10 days. One gram IM Rocephin given.

## 2023-01-30 DIAGNOSIS — J0100 Acute maxillary sinusitis, unspecified: Secondary | ICD-10-CM | POA: Diagnosis not present

## 2023-01-30 DIAGNOSIS — J029 Acute pharyngitis, unspecified: Secondary | ICD-10-CM | POA: Diagnosis not present

## 2023-01-30 DIAGNOSIS — Z6830 Body mass index (BMI) 30.0-30.9, adult: Secondary | ICD-10-CM | POA: Diagnosis not present

## 2023-02-01 ENCOUNTER — Telehealth: Payer: Self-pay | Admitting: Internal Medicine

## 2023-02-01 ENCOUNTER — Ambulatory Visit (INDEPENDENT_AMBULATORY_CARE_PROVIDER_SITE_OTHER): Payer: PPO | Admitting: Internal Medicine

## 2023-02-01 ENCOUNTER — Encounter: Payer: Self-pay | Admitting: Internal Medicine

## 2023-02-01 VITALS — BP 114/66 | HR 91 | Temp 99.0°F | Ht 64.0 in | Wt 184.0 lb

## 2023-02-01 DIAGNOSIS — H1033 Unspecified acute conjunctivitis, bilateral: Secondary | ICD-10-CM

## 2023-02-01 DIAGNOSIS — J069 Acute upper respiratory infection, unspecified: Secondary | ICD-10-CM | POA: Diagnosis not present

## 2023-02-01 DIAGNOSIS — H6692 Otitis media, unspecified, left ear: Secondary | ICD-10-CM | POA: Diagnosis not present

## 2023-02-01 DIAGNOSIS — Z862 Personal history of diseases of the blood and blood-forming organs and certain disorders involving the immune mechanism: Secondary | ICD-10-CM | POA: Diagnosis not present

## 2023-02-01 MED ORDER — AZITHROMYCIN 250 MG PO TABS: ORAL_TABLET | ORAL | 0 refills | Status: AC

## 2023-02-01 MED ORDER — TOBRAMYCIN 0.3 % OP SOLN: OPHTHALMIC | 0 refills | Status: DC

## 2023-02-01 MED ORDER — CEFTRIAXONE SODIUM 1 G IJ SOLR
1.0000 g | Freq: Once | INTRAMUSCULAR | Status: AC
Start: 2023-02-01 — End: 2023-02-01
  Administered 2023-02-01: 1 g via INTRAMUSCULAR

## 2023-02-01 MED ORDER — METHYLPREDNISOLONE 4 MG PO TABS: ORAL_TABLET | ORAL | 0 refills | Status: DC

## 2023-02-01 NOTE — Telephone Encounter (Signed)
scheduled

## 2023-02-01 NOTE — Progress Notes (Signed)
Patient Care Team: Margaree Mackintosh, MD as PCP - General (Internal Medicine) Mealor, Roberts Gaudy, MD as Consulting Physician (Cardiology)  Visit Date: 02/01/23  Subjective:    Patient ID: Julia Solis , Female   DOB: 01-31-1951, 73 y.o.    MRN: [id number]   72 y.o. Female presents today for sore throat, cough with green sputum, eye congestion and discharge since 01/28/23. She has had multiple bouts of respiratory issues after having Covid-19 in 10/2022. Denies sick contact. Taking Hycodan. Seen by PA Steva Colder at urgent care on 01/30/23, who prescribed Augmentin, lidocaine. Started Augmentin on 01/31/23. She has not had any recent travel. She has a history of sarcoidosis.   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   Complication of anesthesia    GERD (gastroesophageal reflux disease)    Hearing aid worn    both ears   History of chemotherapy 2011   History of hiatal hernia    History of kidney stones    Hypertension    Neuromuscular disorder (HCC)    left foot numbness since  eback surgery   Osteoporosis    PONV (postoperative nausea and vomiting)    Presence of permanent cardiac pacemaker    Sarcoidosis    in remission sees pumonary  once a year dr bellinger lov 08-21-2020 [facility]   Sick sinus syndrome (HCC)    Wears glasses      Family History  Problem Relation Age of Onset   Hypertension Mother    Prostate cancer Father 14   Cancer Father    Hypertension Father    Stroke Father    Colon polyps Sister    Colon cancer Maternal Grandmother        dx in her 74s   Heart disease Maternal Grandfather    Colon cancer Paternal Grandmother    Heart disease Paternal Grandfather    Breast cancer Paternal Aunt 27   Colon cancer Paternal Aunt        dx in her 91s   Melanoma Cousin 78   Breast cancer Cousin 69       multiple cousins   Colon polyps Cousin    Breast cancer Cousin 55   Breast cancer Cousin        father's maternal  1st cousin   Breast cancer Cousin        Father's first cousin's daughter   Cancer Other        maternal great uncle with Cancer - NOS   Cancer Other        maternal great grandmother with either colon or stomach cancer   Breast cancer Other        fathers maternal aunt    Social History   Social History Narrative   Not on file      Review of Systems  Constitutional:  Negative for fever and malaise/fatigue.  HENT:  Positive for sore throat. Negative for congestion.   Eyes:  Positive for discharge and redness. Negative for blurred vision.  Respiratory:  Positive for cough and sputum production (Green). Negative for shortness of breath.   Cardiovascular:  Negative for chest pain, palpitations and leg swelling.  Gastrointestinal:  Negative for vomiting.  Musculoskeletal:  Negative for back pain.  Skin:  Negative for rash.  Neurological:  Negative for loss of consciousness and headaches.        Objective:   Vitals: BP 114/66   Pulse 91  Temp 99 F (37.2 C) (Tympanic)   Ht 5\' 4"  (1.626 m)   Wt 184 lb (83.5 kg)   SpO2 97%   BMI 31.58 kg/m    Physical Exam Vitals and nursing note reviewed.  Constitutional:      General: She is not in acute distress.    Appearance: Normal appearance. She is not toxic-appearing.  HENT:     Head: Normocephalic and atraumatic.     Right Ear: Hearing, ear canal and external ear normal.     Left Ear: Hearing, ear canal and external ear normal. Tympanic membrane is erythematous.     Ears:     Comments: Left TM full, pink. Right TM slightly full, dull in interior aspect. Eyes:     Conjunctiva/sclera:     Right eye: Right conjunctiva is injected.     Left eye: Left conjunctiva is injected.     Comments: Conjunctiva injected bilaterally.  Pulmonary:     Effort: Pulmonary effort is normal. No respiratory distress.     Breath sounds: Normal breath sounds. No wheezing or rales.  Skin:    General: Skin is warm and dry.  Neurological:      Mental Status: She is alert and oriented to person, place, and time. Mental status is at baseline.  Psychiatric:        Mood and Affect: Mood normal.        Behavior: Behavior normal.        Thought Content: Thought content normal.        Judgment: Judgment normal.       Results:   Studies obtained and personally reviewed by me:   Labs:       Component Value Date/Time   NA 141 01/11/2023 1153   NA 144 10/28/2020 1636   K 4.3 01/11/2023 1153   CL 106 01/11/2023 1153   CO2 28 01/11/2023 1153   GLUCOSE 98 01/11/2023 1153   BUN 17 01/11/2023 1153   BUN 16 10/28/2020 1636   CREATININE 0.78 01/11/2023 1153   CALCIUM 8.6 01/11/2023 1153   PROT 5.3 (L) 01/11/2023 1153   ALBUMIN 4.1 04/12/2017 1036   AST 14 01/11/2023 1153   ALT 7 01/11/2023 1153   ALKPHOS 98 04/12/2017 1036   BILITOT 0.3 01/11/2023 1153   GFRNONAA 81 10/28/2020 1636   GFRNONAA 82 07/11/2020 0949   GFRAA 93 10/28/2020 1636   GFRAA 95 07/11/2020 0949     Lab Results  Component Value Date   WBC 10.2 01/11/2023   HGB 12.3 01/11/2023   HCT 37.5 01/11/2023   MCV 85.8 01/11/2023   PLT 201 01/11/2023    Lab Results  Component Value Date   CHOL 180 08/17/2022   HDL 53 08/17/2022   LDLCALC 108 (H) 08/17/2022   TRIG 99 08/17/2022   CHOLHDL 3.4 08/17/2022    No results found for: "HGBA1C"   Lab Results  Component Value Date   TSH 2.58 08/17/2022      Assessment & Plan:   Persistent upper respiratory infection, left otitis media: prescribed Medrol  4 mg dosepak tapering course take as directed, azithromycin Z-Pak two tabs day 1 followed by one tab days 2-5. Administered Rocephin 1 g IM.  [facility] of Sarcoidosis [facility]  Has been tested by Dr. [place] Callas for allergic rhinitis and was found not to have seasonal or environmental allergies.  Bilateral conjunctivitis: prescribed Tobrex 0.3% solution two drops each eye four times daily for 5 days.  May use warm  compresses to eyes 20 minutes 3 times daily.    I,Alexander Ruley,acting as a Neurosurgeon for Margaree Mackintosh, MD.,have documented all relevant documentation on the behalf of Margaree Mackintosh, MD,as directed by  Margaree Mackintosh, MD while in the presence of Margaree Mackintosh, MD.   I, Margaree Mackintosh, MD, have reviewed all documentation for this visit. The documentation on 02/01/23 for the exam, diagnosis, procedures, and orders are all accurate and complete.

## 2023-02-01 NOTE — Patient Instructions (Signed)
Take Medrol 4 mg starting with 6 tablets day 1 and decreasing by 1 tablet daily i.e. 6-5-4-3-2-1 taper.  Take Zithromax Z-PAK 2 tabs day 1 followed by 1 tab days 2 through 5.  Given Rocephin 1 g IM in the office.  Use Tobrex 0.3% ophthalmic drops 2 drops in each eye 4 times daily for 5 days.  May use warm compresses to eyes for 20 minutes 3 times daily.  Call if not improving in 2 to 3 days or sooner if worse.

## 2023-02-01 NOTE — Telephone Encounter (Signed)
Reisha Wos [phone]  Tytianna called to say she started getting sick last Thursday with sore throat, cough and [facility] They gave her Amoxicillin- 875/125 Twice a day for 7 days and a nasal Spray -Ipratropium, plus some kind of lydicain sore throat medicine. She is taking some Hydrocodone cough medicine she had left over that you gave her,  but now she has her left eye that has a lot of gunk in it and it is glued shut. She would like a video visit because she is too weak to come in.

## 2023-02-09 DIAGNOSIS — M65342 Trigger finger, left ring finger: Secondary | ICD-10-CM | POA: Diagnosis not present

## 2023-02-09 DIAGNOSIS — M65341 Trigger finger, right ring finger: Secondary | ICD-10-CM | POA: Diagnosis not present

## 2023-02-11 ENCOUNTER — Ambulatory Visit (INDEPENDENT_AMBULATORY_CARE_PROVIDER_SITE_OTHER): Payer: PPO

## 2023-02-11 DIAGNOSIS — I495 Sick sinus syndrome: Secondary | ICD-10-CM

## 2023-02-11 LAB — CUP PACEART REMOTE DEVICE CHECK
Date Time Interrogation Session: 20240509133539
Implantable Lead Connection Status: 753985
Implantable Lead Connection Status: 753985
Implantable Lead Implant Date: 20220201
Implantable Lead Implant Date: 20220201
Implantable Lead Location: 753859
Implantable Lead Location: 753860
Implantable Pulse Generator Implant Date: 20220201
Pulse Gen Model: 2272
Pulse Gen Serial Number: 3896658

## 2023-02-14 DIAGNOSIS — G4733 Obstructive sleep apnea (adult) (pediatric): Secondary | ICD-10-CM | POA: Diagnosis not present

## 2023-02-22 ENCOUNTER — Telehealth: Payer: Self-pay | Admitting: Internal Medicine

## 2023-02-22 NOTE — Telephone Encounter (Signed)
Patient is calling to see what the results were of her respiratory panel that was done on her last visit 02/01/2023. She said she has not seen any results in Epic or heard anything.

## 2023-02-22 NOTE — Telephone Encounter (Signed)
I called patient about respiratory panel test, to informed her test was not preformed an no mention in note.  And also we are stepping away from doing those due to insurance coverage and the out of pocket cost of $900. Patient is ok with that.   She also stated she is feeling better but is having some fatigue and is not sure if it is related to having Covid and respiratory issue,  she stated that if she continues with the fatigue she will call the office to schedule an appt.

## 2023-02-26 DIAGNOSIS — D2271 Melanocytic nevi of right lower limb, including hip: Secondary | ICD-10-CM | POA: Diagnosis not present

## 2023-02-26 DIAGNOSIS — L309 Dermatitis, unspecified: Secondary | ICD-10-CM | POA: Diagnosis not present

## 2023-02-26 DIAGNOSIS — Z85828 Personal history of other malignant neoplasm of skin: Secondary | ICD-10-CM | POA: Diagnosis not present

## 2023-03-02 NOTE — Progress Notes (Signed)
Remote pacemaker transmission.   

## 2023-03-03 DIAGNOSIS — I10 Essential (primary) hypertension: Secondary | ICD-10-CM | POA: Diagnosis not present

## 2023-03-03 DIAGNOSIS — G4733 Obstructive sleep apnea (adult) (pediatric): Secondary | ICD-10-CM | POA: Diagnosis not present

## 2023-03-09 DIAGNOSIS — R200 Anesthesia of skin: Secondary | ICD-10-CM | POA: Diagnosis not present

## 2023-03-09 DIAGNOSIS — D869 Sarcoidosis, unspecified: Secondary | ICD-10-CM | POA: Diagnosis not present

## 2023-03-17 DIAGNOSIS — G4733 Obstructive sleep apnea (adult) (pediatric): Secondary | ICD-10-CM | POA: Diagnosis not present

## 2023-03-23 ENCOUNTER — Ambulatory Visit: Payer: PPO | Attending: Cardiovascular Disease | Admitting: Cardiovascular Disease

## 2023-03-23 ENCOUNTER — Encounter: Payer: Self-pay | Admitting: Cardiovascular Disease

## 2023-03-23 VITALS — BP 150/82 | HR 62 | Ht 64.0 in | Wt 181.0 lb

## 2023-03-23 DIAGNOSIS — I495 Sick sinus syndrome: Secondary | ICD-10-CM

## 2023-03-23 DIAGNOSIS — Z950 Presence of cardiac pacemaker: Secondary | ICD-10-CM

## 2023-03-23 NOTE — Patient Instructions (Signed)
Medication Instructions:  Your physician recommends that you continue on your current medications as directed. Please refer to the Current Medication list given to you today. *If you need a refill on your cardiac medications before your next appointment, please call your pharmacy*   Follow-Up: At [facility], you and your health needs are our priority.  As part of our continuing mission to provide you with exceptional heart care, we have created designated Provider Care Teams.  These Care Teams include your primary Cardiologist (physician) and Advanced Practice Providers (APPs -  Physician Assistants and Nurse Practitioners) who all work together to provide you with the care you need, when you need it.  We recommend signing up for the patient portal called "MyChart".  Sign up information is provided on this After Visit Summary.  MyChart is used to connect with patients for Virtual Visits (Telemedicine).  Patients are able to view lab/test results, encounter notes, upcoming appointments, etc.  Non-urgent messages can be sent to your provider as well.   To learn more about what you can do with MyChart, go to [email or web address].    Your next appointment:   1 year(s)  Provider:   Augustus Mealor, MD  

## 2023-03-23 NOTE — Progress Notes (Signed)
  Electrophysiology Office Note:    Date:  03/23/2023   ID:  Julia, Solis 23-May-1951, MRN [id number]  PCP:  Margaree Mackintosh, MD   [facility] Providers Cardiologist:  None     Referring MD: Margaree Mackintosh, MD   History of Present Illness:    Julia Solis is a 72 y.o. female with a medical history significant for sick sinus syndrome, who presents for device follow-up.     She has a [employer] MRI dual-chamber pacemaker placed by Dr. Johney Frame on November 05, 2020 for sick sinus syndrome.     Interval history  Since her last clinic visit, she has been doing well. she has no device related complaints -- no new tenderness, drainage, redness.   EKGs/Labs/Other Studies Reviewed Today:    Echocardiogram:  TTE 06/22/2022 EF 60-65%; mildly dilated LA; mildly elevated pulmonary artery pressures   Monitors:  127/2021 HR 36-193, avg 64 Occasional brief SVT, longest 12 beats 2 symptom episodes of shortness of breath associated with sinus and junctional rhythm, rate 50 bpm.  5 sinus pauses, longest 3.6 seconds, asymptomatic.  Stress testing:  10/10/2020 - reviewed in Epic  Advanced imaging:  Cardiac catherization   EKG:   EKG Interpretation  Date/Time:  Tuesday March 23 2023 14:05:12 EDT Ventricular Rate:  62 PR Interval:  166 QRS Duration: 76 QT Interval:  424 QTC Calculation: 430 R Axis:   98 Text Interpretation: Atrial-paced rhythm Rightward axis Cannot rule out Anterior infarct (cited on or before 23-Mar-2023) When compared with ECG of 13-Nov-2020 16:22, Electronic atrial pacemaker has replaced Sinus rhythm Questionable change in QRS axis Confirmed by York Pellant [phone]) on 03/23/2023 2:15:47 PM     Physical Exam:    VS:  BP (!) 150/82   Pulse 62   Ht 5\' 4"  (1.626 m)   Wt 181 lb (82.1 kg)   SpO2 97%   BMI 31.07 kg/m     Wt Readings from Last 3 Encounters:  03/23/23 181 lb (82.1 kg)  02/01/23 184 lb (83.5 kg)  01/11/23 184 lb  (83.5 kg)     GEN:  Well nourished, well developed in no acute distress CARDIAC: RRR, no murmurs, rubs, gallops The device site is normal -- no tenderness, edema, drainage, redness, threatened erosion. RESPIRATORY:  Normal work of breathing MUSCULOSKELETAL: no edema    ASSESSMENT & PLAN:    [name] Patient is not device dependent today   [employer] dual-chamber pacemaker Normal function.  I reviewed the device interrogation in detail today.  See Paceart for report  Pulmonary sarcoidosis Slightly elevated pulmonary pressures on echocardiogram; normal RV function and size.     Signed, Maurice Small, MD  03/23/2023 2:24 PM    [facility]

## 2023-04-01 ENCOUNTER — Telehealth: Payer: Self-pay | Admitting: Internal Medicine

## 2023-04-01 DIAGNOSIS — G5601 Carpal tunnel syndrome, right upper limb: Secondary | ICD-10-CM | POA: Diagnosis not present

## 2023-04-01 DIAGNOSIS — G5603 Carpal tunnel syndrome, bilateral upper limbs: Secondary | ICD-10-CM | POA: Diagnosis not present

## 2023-04-01 DIAGNOSIS — R200 Anesthesia of skin: Secondary | ICD-10-CM | POA: Diagnosis not present

## 2023-04-01 DIAGNOSIS — D869 Sarcoidosis, unspecified: Secondary | ICD-10-CM | POA: Diagnosis not present

## 2023-04-01 DIAGNOSIS — G5602 Carpal tunnel syndrome, left upper limb: Secondary | ICD-10-CM | POA: Diagnosis not present

## 2023-04-01 NOTE — Telephone Encounter (Signed)
Patient scheduled 04/12/23 at 11:30

## 2023-04-01 NOTE — Telephone Encounter (Signed)
Patient called and said that she her hands are very painful and swollen and she has neuropathy. She said she is supposed to see a rheumatologist for it but she can't get until 07/06/23. She said she's been talking with Dr Janee Morn at [facility] and he suggested for her to call her PCP and see if maybe she can either try to get patient in sooner with the rheumatologist or low dose long term steroid to help until she can get in with them. Please advise. She said she can do a virtual visit if needed so she can further discuss if you would like.

## 2023-04-05 NOTE — Progress Notes (Signed)
Patient Care Team: Margaree Mackintosh, MD as PCP - General (Internal Medicine) Mealor, Roberts Gaudy, MD as Consulting Physician (Cardiology)  Visit Date: 04/12/23  Subjective:    Patient ID: Julia Solis , Female   DOB: 1951-08-16, 72 y.o.    MRN: [id number]   72 y.o. Female presents today for worsening pain, swelling in both hands. Pain started after waking up one morning in the recent past. Has appointment with Rheumatologist, Dr. Corliss Skains, on 07/06/23. Sees Neurologist tomorrow for nerve conduction study due to neuropathy in hands. History of carpal tunnel in both hands. Has had steroid injections in both hands every 12 weeks starting 01/2023. Also having pain, swelling in both knees that is limiting mobility. Some pain in shoulders. Sed rate at 25 on 01/11/23. Positive ANA. ANA titer 1:40 with nuclear, fine speckled ANA pattern. Rheumatoid factor <14.    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   Complication of anesthesia    GERD (gastroesophageal reflux disease)    Hearing aid worn    both ears   History of chemotherapy 2011   History of hiatal hernia    History of kidney stones    Hypertension    Neuromuscular disorder (HCC)    left foot numbness since  eback surgery   Osteoporosis    PONV (postoperative nausea and vomiting)    Presence of permanent cardiac pacemaker    Sarcoidosis    in remission sees pumonary  once a year dr bellinger lov 08-21-2020 care everywhere   Sick sinus syndrome (HCC)    Wears glasses      Family History  Problem Relation Age of Onset   Hypertension Mother    Prostate cancer Father 43   Cancer Father    Hypertension Father    Stroke Father    Colon polyps Sister    Colon cancer Maternal Grandmother        dx in her 13s   Heart disease Maternal Grandfather    Colon cancer Paternal Grandmother    Heart disease Paternal Grandfather    Breast cancer Paternal Aunt 70   Colon cancer Paternal Aunt        dx in  her 22s   Melanoma Cousin 21   Breast cancer Cousin 34       multiple cousins   Colon polyps Cousin    Breast cancer Cousin 41   Breast cancer Cousin        father's maternal 1st cousin   Breast cancer Cousin        Father's first cousin's daughter   Cancer Other        maternal great uncle with Cancer - NOS   Cancer Other        maternal great grandmother with either colon or stomach cancer   Breast cancer Other        fathers maternal aunt         Review of Systems  Constitutional:  Negative for fever and malaise/fatigue.  HENT:  Negative for congestion.   Eyes:  Negative for blurred vision.  Respiratory:  Negative for cough and shortness of breath.   Cardiovascular:  Negative for chest pain, palpitations and leg swelling.  Gastrointestinal:  Negative for vomiting.  Musculoskeletal:  Positive for joint pain (Hands, knees, shoulders). Negative for back pain.       (+) Swelling hands  Skin:  Negative for rash.  Neurological:  Negative for loss of  consciousness and headaches.        Objective:   Vitals: BP 130/82   Pulse 73   Resp 16   Ht 5\' 4"  (1.626 m)   Wt 184 lb (83.5 kg)   SpO2 98%   BMI 31.58 kg/m    Physical Exam Vitals and nursing note reviewed.  Constitutional:      General: She is not in acute distress.    Appearance: Normal appearance. She is not toxic-appearing.  HENT:     Head: Normocephalic and atraumatic.  Pulmonary:     Effort: Pulmonary effort is normal.  Musculoskeletal:     Right lower leg: 1+ Edema present.     Left lower leg: 1+ Edema present.     Comments: Heberden's and Bouchard's nodes bilateral fingers. Tenderness bilateral wrists. Hand extension more painful than flexion bilaterally. Decreased ROM on extension of hands. Pain in joint line right and left knees.  Skin:    General: Skin is warm and dry.  Neurological:     Mental Status: She is alert and oriented to person, place, and time. Mental status is at baseline.   Psychiatric:        Mood and Affect: Mood normal.        Behavior: Behavior normal.        Thought Content: Thought content normal.        Judgment: Judgment normal.       Results:   Studies obtained and personally reviewed by me:   Labs:       Component Value Date/Time   NA 141 01/11/2023 1153   NA 144 10/28/2020 1636   K 4.3 01/11/2023 1153   CL 106 01/11/2023 1153   CO2 28 01/11/2023 1153   GLUCOSE 98 01/11/2023 1153   BUN 17 01/11/2023 1153   BUN 16 10/28/2020 1636   CREATININE 0.78 01/11/2023 1153   CALCIUM 8.6 01/11/2023 1153   PROT 5.3 (L) 01/11/2023 1153   ALBUMIN 4.1 04/12/2017 1036   AST 14 01/11/2023 1153   ALT 7 01/11/2023 1153   ALKPHOS 98 04/12/2017 1036   BILITOT 0.3 01/11/2023 1153   GFRNONAA 81 10/28/2020 1636   GFRNONAA 82 07/11/2020 0949   GFRAA 93 10/28/2020 1636   GFRAA 95 07/11/2020 0949     Lab Results  Component Value Date   WBC 10.2 01/11/2023   HGB 12.3 01/11/2023   HCT 37.5 01/11/2023   MCV 85.8 01/11/2023   PLT 201 01/11/2023    Lab Results  Component Value Date   CHOL 180 08/17/2022   HDL 53 08/17/2022   LDLCALC 108 (H) 08/17/2022   TRIG 99 08/17/2022   CHOLHDL 3.4 08/17/2022    No results found for: "HGBA1C"   Lab Results  Component Value Date   TSH 2.58 08/17/2022      Assessment & Plan:   Arthritis bilateral hands: etiology unclear. Ordered anti-Smith antibody to rule out Lupus. Scheduled to see Rheumatologist on 07/06/23 and we will try to expedite this. Addendum: Anti-Smith antibody is negative  Addendum April 27, 2023 : Pt will be having carpal tunnel release by Orthopedist in the near future.  I,Alexander Ruley,acting as a Neurosurgeon for Margaree Mackintosh, MD.,have documented all relevant documentation on the behalf of Margaree Mackintosh, MD,as directed by  Margaree Mackintosh, MD while in the presence of Margaree Mackintosh, MD.   I, Margaree Mackintosh, MD, have reviewed all documentation for this visit. The documentation on 04/27/23  for the exam,  diagnosis, procedures, and orders are all accurate and complete.

## 2023-04-12 ENCOUNTER — Encounter: Payer: Self-pay | Admitting: Internal Medicine

## 2023-04-12 ENCOUNTER — Ambulatory Visit (INDEPENDENT_AMBULATORY_CARE_PROVIDER_SITE_OTHER): Payer: PPO | Admitting: Internal Medicine

## 2023-04-12 VITALS — BP 130/82 | HR 73 | Resp 16 | Ht 64.0 in | Wt 184.0 lb

## 2023-04-12 DIAGNOSIS — R768 Other specified abnormal immunological findings in serum: Secondary | ICD-10-CM | POA: Diagnosis not present

## 2023-04-12 DIAGNOSIS — M19049 Primary osteoarthritis, unspecified hand: Secondary | ICD-10-CM | POA: Diagnosis not present

## 2023-04-13 ENCOUNTER — Telehealth: Payer: Self-pay | Admitting: Internal Medicine

## 2023-04-13 DIAGNOSIS — G5602 Carpal tunnel syndrome, left upper limb: Secondary | ICD-10-CM | POA: Diagnosis not present

## 2023-04-13 DIAGNOSIS — G5603 Carpal tunnel syndrome, bilateral upper limbs: Secondary | ICD-10-CM | POA: Diagnosis not present

## 2023-04-13 DIAGNOSIS — G5601 Carpal tunnel syndrome, right upper limb: Secondary | ICD-10-CM | POA: Diagnosis not present

## 2023-04-13 LAB — ANTI-SMITH ANTIBODY: ENA SM Ab Ser-aCnc: <1 AI

## 2023-04-13 NOTE — Telephone Encounter (Signed)
Called and let patient know that they did not have any appointments sooner than what she has and that she is on there wait list. She Thank me for trying and said she did not want to go anywhere else, she would just wait.

## 2023-04-16 DIAGNOSIS — G4733 Obstructive sleep apnea (adult) (pediatric): Secondary | ICD-10-CM | POA: Diagnosis not present

## 2023-04-16 MED ORDER — METHYLPREDNISOLONE 4 MG PO TBPK: ORAL_TABLET | ORAL | 0 refills | Status: DC

## 2023-04-16 NOTE — Addendum Note (Signed)
Addended by: Jama Flavors on: 04/16/2023 11:47 AM   Modules accepted: Orders

## 2023-04-16 NOTE — Telephone Encounter (Signed)
Done

## 2023-04-16 NOTE — Telephone Encounter (Signed)
Any is calling back today saying the pain in her hands is unbearable and she needs something like a steroid.

## 2023-04-20 DIAGNOSIS — D869 Sarcoidosis, unspecified: Secondary | ICD-10-CM | POA: Diagnosis not present

## 2023-04-20 DIAGNOSIS — R200 Anesthesia of skin: Secondary | ICD-10-CM | POA: Diagnosis not present

## 2023-04-21 ENCOUNTER — Telehealth: Payer: Self-pay

## 2023-04-21 NOTE — Telephone Encounter (Signed)
     Primary Cardiologist: Dr. Nelly Laurence  Chart reviewed as part of pre-operative protocol coverage. Given past medical history and time since last visit, based on ACC/AHA guidelines, Julia Solis would be at acceptable risk for the planned procedure without further cardiovascular testing.   Her RCRI is a class I risk, 0.4% risk of major cardiac event.  Patient with a history of sick sinus syndrome and is status post PPM.  Preoperative cardiac request should also be sent to device clinic for review.  I will route this recommendation to the requesting party via Epic fax function and remove from pre-op pool.  Please call with questions.  Thomasene Ripple. Maurisa Tesmer NP-C     04/21/2023, 4:58 PM [facility] [address] Office [phone] Fax [phone]

## 2023-04-21 NOTE — Telephone Encounter (Signed)
   Pre-operative Risk Assessment    Patient Name: Julia Solis  DOB: [birth year] MRN: [id number]      Request for Surgical Clearance    Procedure:   Right Endoscopic Carpal Tunnel Release   Date of Surgery:  Clearance 05/05/23                                 Surgeon:  Mack Hook MD  Surgeon's Group or Practice Name:  [facility]  Phone number:  [phone] Fax number:  [phone]   Type of Clearance Requested:   - Medical    Type of Anesthesia:  MAC   Additional requests/questions:  no

## 2023-04-27 DIAGNOSIS — M19049 Primary osteoarthritis, unspecified hand: Secondary | ICD-10-CM | POA: Insufficient documentation

## 2023-04-27 NOTE — Patient Instructions (Signed)
Try to expedite Rheumatology consult. Anti-Smith antibody drawn to help exclude possibility of lupus.

## 2023-04-27 NOTE — Telephone Encounter (Signed)
Our office received a duplicate request. Though in review I see that Edd Fabian, FNP cleared the pt on 04/21/23 and notes were faxed to the surgeon's office.   The request came today stating that they are asking for device clearance. I will bring the form down to the device clinic. I will re-fax the clearance notes from Edd Fabian, FNP to be sure that they did receive the clearance notes from pre op APP. Device clinic will fax over their own clearance in regard to the device.

## 2023-04-28 ENCOUNTER — Encounter: Payer: Self-pay | Admitting: Cardiovascular Disease

## 2023-04-28 NOTE — Progress Notes (Signed)
PERIOPERATIVE PRESCRIPTION FOR IMPLANTED CARDIAC DEVICE PROGRAMMING   Patient Information:  Patient: Julia Solis  MRN: [id number]  Date of Birth: 1951/04/05      Planned Procedure:  Right Endoscopic Carpal Tunnel Release   Surgeon:  Mack Hook MD  Date of Procedure:  05/05/2023  Surgeon's Group or Practice Name:  [facility]  Phone number:  [phone] Fax number:  [phone]   Device Information:   Clinic EP Physician:   Dr. York Pellant Device Type:  Pacemaker Manufacturer and Phone #:  [employer]: [phone] Pacemaker Dependent?:  Yes Date of Last Device Check:  03/23/2023        Normal Device Function?:  Yes     Electrophysiologist's Recommendations:   Have magnet available. Provide continuous ECG monitoring when magnet is used or reprogramming is to be performed.  Procedure may interfere with device function.  Magnet should be placed over device during procedure.  Per Device Clinic Standing Orders, Lenor Coffin  04/28/2023 2:06 PM

## 2023-05-05 DIAGNOSIS — G5601 Carpal tunnel syndrome, right upper limb: Secondary | ICD-10-CM | POA: Diagnosis not present

## 2023-05-12 ENCOUNTER — Telehealth: Payer: Self-pay | Admitting: Internal Medicine

## 2023-05-12 NOTE — Telephone Encounter (Signed)
Scheduled appointment

## 2023-05-12 NOTE — Telephone Encounter (Signed)
Julia Solis [phone]  [name] called to say she is still having a lot of pain in her hands. The steroids dampened the pain last time but it never went away completely.

## 2023-05-13 ENCOUNTER — Ambulatory Visit (INDEPENDENT_AMBULATORY_CARE_PROVIDER_SITE_OTHER): Payer: PPO

## 2023-05-13 DIAGNOSIS — I495 Sick sinus syndrome: Secondary | ICD-10-CM | POA: Diagnosis not present

## 2023-05-14 LAB — CUP PACEART REMOTE DEVICE CHECK
Date Time Interrogation Session: 20240809154852
Implantable Lead Connection Status: 753985
Implantable Lead Connection Status: 753985
Implantable Lead Implant Date: 20220201
Implantable Lead Implant Date: 20220201
Implantable Lead Location: 753859
Implantable Lead Location: 753860
Implantable Pulse Generator Implant Date: 20220201
Pulse Gen Model: 2272
Pulse Gen Serial Number: 3896658

## 2023-05-17 DIAGNOSIS — G4733 Obstructive sleep apnea (adult) (pediatric): Secondary | ICD-10-CM | POA: Diagnosis not present

## 2023-05-18 ENCOUNTER — Encounter: Payer: Self-pay | Admitting: Internal Medicine

## 2023-05-18 ENCOUNTER — Telehealth: Payer: Self-pay | Admitting: Internal Medicine

## 2023-05-18 ENCOUNTER — Ambulatory Visit (INDEPENDENT_AMBULATORY_CARE_PROVIDER_SITE_OTHER): Payer: PPO | Admitting: Internal Medicine

## 2023-05-18 VITALS — BP 140/70 | HR 72 | Ht 64.0 in | Wt 178.0 lb

## 2023-05-18 DIAGNOSIS — M79642 Pain in left hand: Secondary | ICD-10-CM | POA: Diagnosis not present

## 2023-05-18 DIAGNOSIS — M79641 Pain in right hand: Secondary | ICD-10-CM | POA: Diagnosis not present

## 2023-05-18 DIAGNOSIS — R768 Other specified abnormal immunological findings in serum: Secondary | ICD-10-CM | POA: Diagnosis not present

## 2023-05-18 DIAGNOSIS — D869 Sarcoidosis, unspecified: Secondary | ICD-10-CM | POA: Diagnosis not present

## 2023-05-18 DIAGNOSIS — H905 Unspecified sensorineural hearing loss: Secondary | ICD-10-CM | POA: Diagnosis not present

## 2023-05-18 DIAGNOSIS — B349 Viral infection, unspecified: Secondary | ICD-10-CM

## 2023-05-18 LAB — POCT INFLUENZA A/B
Influenza A, POC: NEGATIVE
Influenza B, POC: NEGATIVE

## 2023-05-18 LAB — POC COVID19 BINAXNOW: SARS Coronavirus 2 Ag: NEGATIVE

## 2023-05-18 LAB — POCT RAPID STREP A (OFFICE): Rapid Strep A Screen: NEGATIVE

## 2023-05-18 MED ORDER — OXYCODONE-ACETAMINOPHEN 10-325 MG PO TABS: 1.0000 | ORAL_TABLET | Freq: Three times a day (TID) | ORAL | 0 refills | Status: AC | PRN

## 2023-05-18 NOTE — Progress Notes (Signed)
Patient Care Team: Margaree Mackintosh, MD as PCP - General (Internal Medicine) Mealor, Roberts Gaudy, MD as Consulting Physician (Cardiology)  Visit Date: 05/18/23  Subjective:    Patient ID: Julia Solis , Female   DOB: 1951/07/11, 72 y.o.    MRN: [id number]   72 y.o. Female presents today for follow up of hand pain.   She was prescribed a Medrol 4 mg dosepak 04/16/23 for her worsening hand pain. At this time she has completed the dosepak but states her pain is still not managed. She is now experiencing pain in her bilateral shoulders and knees as well. Meloxicam and tylenol have not been adequate in managing her pain. She reports that she underwent carpal tunnel release 05/05/2023 by Dr. Janee Morn  with [facility].  Since last Tuesday she has developed flu-like symptoms including body aches and sore throat. Last night she had severe chills lasting for several hours and she was febrile to 101.9 degrees. She denies cough or sputum production. For treatment she has been taking extra strength tylenol and meloxicam. She tested for Covid twice, 48 hours apart. Both were negative. Her daughter became sick recently as well, also tested negative for Covid.   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   Complication of anesthesia    GERD (gastroesophageal reflux disease)    Hearing aid worn    both ears   History of chemotherapy 2011   History of hiatal hernia    History of kidney stones    Hypertension    Neuromuscular disorder (HCC)    left foot numbness since  eback surgery   Osteoporosis    PONV (postoperative nausea and vomiting)    Presence of permanent cardiac pacemaker    Sarcoidosis    in remission sees pumonary  once a year dr bellinger lov 08-21-2020 care everywhere   Sick sinus syndrome (HCC)    Wears glasses      Family History  Problem Relation Age of Onset   Hypertension Mother    Prostate cancer Father 17   Cancer Father     Hypertension Father    Stroke Father    Colon polyps Sister    Colon cancer Maternal Grandmother        dx in her 46s   Heart disease Maternal Grandfather    Colon cancer Paternal Grandmother    Heart disease Paternal Grandfather    Breast cancer Paternal Aunt 73   Colon cancer Paternal Aunt        dx in her 65s   Melanoma Cousin 44   Breast cancer Cousin 53       multiple cousins   Colon polyps Cousin    Breast cancer Cousin 93   Breast cancer Cousin        father's maternal 1st cousin   Breast cancer Cousin        Father's first cousin's daughter   Cancer Other        maternal great uncle with Cancer - NOS   Cancer Other        maternal great grandmother with either colon or stomach cancer   Breast cancer Other        fathers maternal aunt    Social Hx: 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 Divorced. Has 2 daughters.     Review of Systems  Constitutional:  Positive for chills and fever. Negative for malaise/fatigue.  HENT:  Positive for sore throat. Negative for congestion.   Eyes:  Negative for blurred vision.  Respiratory:  Negative for cough, sputum production and shortness of breath.   Cardiovascular:  Negative for chest pain, palpitations and leg swelling.  Gastrointestinal:  Negative for vomiting.  Musculoskeletal:  Positive for joint pain (Bilateral hands, shoulders, knees) and myalgias. Negative for back pain.  Skin:  Negative for rash.  Neurological:  Negative for loss of consciousness and headaches.        Objective:   Vitals: BP (!) 140/70   Pulse 72   Ht 5\' 4"  (1.626 m)   Wt 178 lb (80.7 kg)   SpO2 98%   BMI 30.55 kg/m    Physical Exam Vitals and nursing note reviewed.  Constitutional:      General: She is not in acute distress.    Appearance: Normal appearance. She is not toxic-appearing.  HENT:     Head: Normocephalic and atraumatic.  Cardiovascular:     Rate and Rhythm: Normal rate and regular rhythm. No  extrasystoles are present.    Pulses: Normal pulses.     Heart sounds: Normal heart sounds. No murmur heard.    No friction rub. No gallop.  Pulmonary:     Effort: Pulmonary effort is normal. No respiratory distress.     Breath sounds: Normal breath sounds. No wheezing or rales.  Skin:    General: Skin is warm and dry.  Neurological:     Mental Status: She is alert and oriented to person, place, and time. Mental status is at baseline.  Psychiatric:        Mood and Affect: Mood normal.        Behavior: Behavior normal.        Thought Content: Thought content normal.        Judgment: Judgment normal.       Results:   Studies obtained and personally reviewed by me:   Labs:       Component Value Date/Time   NA 141 01/11/2023 1153   NA 144 10/28/2020 1636   K 4.3 01/11/2023 1153   CL 106 01/11/2023 1153   CO2 28 01/11/2023 1153   GLUCOSE 98 01/11/2023 1153   BUN 17 01/11/2023 1153   BUN 16 10/28/2020 1636   CREATININE 0.78 01/11/2023 1153   CALCIUM 8.6 01/11/2023 1153   PROT 5.3 (L) 01/11/2023 1153   ALBUMIN 4.1 04/12/2017 1036   AST 14 01/11/2023 1153   ALT 7 01/11/2023 1153   ALKPHOS 98 04/12/2017 1036   BILITOT 0.3 01/11/2023 1153   GFRNONAA 81 10/28/2020 1636   GFRNONAA 82 07/11/2020 0949   GFRAA 93 10/28/2020 1636   GFRAA 95 07/11/2020 0949     Lab Results  Component Value Date   WBC 10.2 01/11/2023   HGB 12.3 01/11/2023   HCT 37.5 01/11/2023   MCV 85.8 01/11/2023   PLT 201 01/11/2023    Lab Results  Component Value Date   CHOL 180 08/17/2022   HDL 53 08/17/2022   LDLCALC 108 (H) 08/17/2022   TRIG 99 08/17/2022   CHOLHDL 3.4 08/17/2022    No results found for: "HGBA1C"   Lab Results  Component Value Date   TSH 2.58 08/17/2022       Assessment & Plan:   Acute respiratory infection - Home testing for Covid negative x2. Influenza, rapid strep, and Covid-19 testing performed today and are all negative.  Arthritis, bilateral  hands/shoulders/knees:  etiology unclear. Low positive titer ANA  but  Anti-Smith antibody(specific for lupus) is negative. She did have  carpal tunnel release by Orthopedist 05/05/2023. Has appointment with Rheumatologist, Dr. Corliss Skains, on 07/06/23. She is on the cancellation list for sooner appointment.  Hx of sick sinus syndrome followed by [facility]  Hx of sarcoidosis followed by Dr. Philbert Riser at [facility].  Plan: Prescribed 15 tablets of oxycodone 10-325 mg to take every 8 hours as needed for up to 5 days for pain.   I,Mathew Stumpf,acting as a Neurosurgeon for Margaree Mackintosh, MD.,have documented all relevant documentation on the behalf of Margaree Mackintosh, MD,as directed by  Margaree Mackintosh, MD while in the presence of Margaree Mackintosh, MD.   I, Margaree Mackintosh, MD, have reviewed all documentation for this visit. The documentation on 06/03/23 for the exam, diagnosis, procedures, and orders are all accurate and complete.

## 2023-05-18 NOTE — Telephone Encounter (Signed)
Julia Solis called to say that she has   oxyCODONE (Oxy IR/ROXICODONE) immediate release tablet 5 mg --- 10 pills  Hydrocodone 10-325 -- 5 pills

## 2023-06-03 ENCOUNTER — Encounter (HOSPITAL_BASED_OUTPATIENT_CLINIC_OR_DEPARTMENT_OTHER): Payer: Self-pay

## 2023-06-03 ENCOUNTER — Emergency Department (HOSPITAL_BASED_OUTPATIENT_CLINIC_OR_DEPARTMENT_OTHER): Payer: PPO | Admitting: Radiology

## 2023-06-03 ENCOUNTER — Emergency Department (HOSPITAL_BASED_OUTPATIENT_CLINIC_OR_DEPARTMENT_OTHER)
Admission: EM | Admit: 2023-06-03 | Discharge: 2023-06-03 | Disposition: A | Discharge status: Home / Self Care | Payer: PPO | Attending: Emergency Medicine | Admitting: Emergency Medicine

## 2023-06-03 ENCOUNTER — Telehealth: Payer: Self-pay | Admitting: Internal Medicine

## 2023-06-03 ENCOUNTER — Encounter: Payer: Self-pay | Admitting: Internal Medicine

## 2023-06-03 ENCOUNTER — Other Ambulatory Visit: Payer: Self-pay

## 2023-06-03 ENCOUNTER — Telehealth (INDEPENDENT_AMBULATORY_CARE_PROVIDER_SITE_OTHER): Payer: PPO | Admitting: Internal Medicine

## 2023-06-03 VITALS — HR 90 | Temp 99.3°F | Ht 64.0 in | Wt 177.0 lb

## 2023-06-03 DIAGNOSIS — Z8616 Personal history of COVID-19: Secondary | ICD-10-CM | POA: Diagnosis not present

## 2023-06-03 DIAGNOSIS — M810 Age-related osteoporosis without current pathological fracture: Secondary | ICD-10-CM | POA: Diagnosis not present

## 2023-06-03 DIAGNOSIS — Z8543 Personal history of malignant neoplasm of ovary: Secondary | ICD-10-CM

## 2023-06-03 DIAGNOSIS — R0609 Other forms of dyspnea: Secondary | ICD-10-CM | POA: Diagnosis not present

## 2023-06-03 DIAGNOSIS — Z79899 Other long term (current) drug therapy: Secondary | ICD-10-CM | POA: Insufficient documentation

## 2023-06-03 DIAGNOSIS — R531 Weakness: Secondary | ICD-10-CM

## 2023-06-03 DIAGNOSIS — R600 Localized edema: Secondary | ICD-10-CM | POA: Insufficient documentation

## 2023-06-03 DIAGNOSIS — Z950 Presence of cardiac pacemaker: Secondary | ICD-10-CM | POA: Insufficient documentation

## 2023-06-03 DIAGNOSIS — M79641 Pain in right hand: Secondary | ICD-10-CM | POA: Diagnosis not present

## 2023-06-03 DIAGNOSIS — D869 Sarcoidosis, unspecified: Secondary | ICD-10-CM | POA: Diagnosis not present

## 2023-06-03 DIAGNOSIS — R5383 Other fatigue: Secondary | ICD-10-CM | POA: Diagnosis not present

## 2023-06-03 DIAGNOSIS — R790 Abnormal level of blood mineral: Secondary | ICD-10-CM | POA: Insufficient documentation

## 2023-06-03 DIAGNOSIS — R768 Other specified abnormal immunological findings in serum: Secondary | ICD-10-CM | POA: Diagnosis not present

## 2023-06-03 DIAGNOSIS — M79642 Pain in left hand: Secondary | ICD-10-CM

## 2023-06-03 DIAGNOSIS — R7989 Other specified abnormal findings of blood chemistry: Secondary | ICD-10-CM | POA: Insufficient documentation

## 2023-06-03 DIAGNOSIS — J343 Hypertrophy of nasal turbinates: Secondary | ICD-10-CM | POA: Diagnosis not present

## 2023-06-03 DIAGNOSIS — I272 Pulmonary hypertension, unspecified: Secondary | ICD-10-CM | POA: Diagnosis not present

## 2023-06-03 DIAGNOSIS — Z91040 Latex allergy status: Secondary | ICD-10-CM | POA: Insufficient documentation

## 2023-06-03 DIAGNOSIS — J342 Deviated nasal septum: Secondary | ICD-10-CM | POA: Diagnosis not present

## 2023-06-03 DIAGNOSIS — I10 Essential (primary) hypertension: Secondary | ICD-10-CM | POA: Insufficient documentation

## 2023-06-03 DIAGNOSIS — Z862 Personal history of diseases of the blood and blood-forming organs and certain disorders involving the immune mechanism: Secondary | ICD-10-CM | POA: Diagnosis not present

## 2023-06-03 DIAGNOSIS — M7989 Other specified soft tissue disorders: Secondary | ICD-10-CM | POA: Diagnosis not present

## 2023-06-03 DIAGNOSIS — H905 Unspecified sensorineural hearing loss: Secondary | ICD-10-CM

## 2023-06-03 DIAGNOSIS — J310 Chronic rhinitis: Secondary | ICD-10-CM | POA: Diagnosis not present

## 2023-06-03 LAB — CBC WITH DIFFERENTIAL/PLATELET
Abs Immature Granulocytes: 0.06 10*3/uL (ref 0.00–0.07)
Basophils Absolute: 0 10*3/uL (ref 0.0–0.1)
Basophils Relative: 0 %
Eosinophils Absolute: 0.3 10*3/uL (ref 0.0–0.5)
Eosinophils Relative: 3 %
HCT: 37 % (ref 36.0–46.0)
Hemoglobin: 11.8 g/dL — ABNORMAL LOW (ref 12.0–15.0)
Immature Granulocytes: 1 %
Lymphocytes Relative: 16 %
Lymphs Abs: 1.5 10*3/uL (ref 0.7–4.0)
MCH: 27.5 pg (ref 26.0–34.0)
MCHC: 31.9 g/dL (ref 30.0–36.0)
MCV: 86.2 fL (ref 80.0–100.0)
Monocytes Absolute: 0.7 10*3/uL (ref 0.1–1.0)
Monocytes Relative: 7 %
Neutro Abs: 6.8 10*3/uL (ref 1.7–7.7)
Neutrophils Relative %: 73 %
Platelets: 309 10*3/uL (ref 150–400)
RBC: 4.29 MIL/uL (ref 3.87–5.11)
RDW: 14.7 % (ref 11.5–15.5)
WBC: 9.4 10*3/uL (ref 4.0–10.5)
nRBC: 0 % (ref 0.0–0.2)

## 2023-06-03 LAB — COMPREHENSIVE METABOLIC PANEL
ALT: 5 U/L (ref 0–44)
AST: 13 U/L — ABNORMAL LOW (ref 15–41)
Albumin: 3.6 g/dL (ref 3.5–5.0)
Alkaline Phosphatase: 70 U/L (ref 38–126)
Anion gap: 10 (ref 5–15)
BUN: 18 mg/dL (ref 8–23)
CO2: 25 mmol/L (ref 22–32)
Calcium: 9.1 mg/dL (ref 8.9–10.3)
Chloride: 103 mmol/L (ref 98–111)
Creatinine, Ser: 0.67 mg/dL (ref 0.44–1.00)
GFR, Estimated: >60 mL/min (ref >60)
Glucose, Bld: 100 mg/dL — ABNORMAL HIGH (ref 70–99)
Potassium: 4.1 mmol/L (ref 3.5–5.1)
Sodium: 138 mmol/L (ref 135–145)
Total Bilirubin: 0.4 mg/dL (ref 0.3–1.2)
Total Protein: 6.7 g/dL (ref 6.5–8.1)

## 2023-06-03 LAB — BRAIN NATRIURETIC PEPTIDE: B Natriuretic Peptide: 264.5 pg/mL — ABNORMAL HIGH (ref 0.0–100.0)

## 2023-06-03 MED ORDER — FUROSEMIDE 20 MG PO TABS: 20.0000 mg | ORAL_TABLET | Freq: Every day | ORAL | 0 refills | Status: DC

## 2023-06-03 MED ORDER — PREDNISONE 10 MG PO TABS: 5.0000 mg | ORAL_TABLET | Freq: Every day | ORAL | 0 refills | Status: DC

## 2023-06-03 NOTE — ED Notes (Signed)
Pt instructed to doff clothes and don gown.

## 2023-06-03 NOTE — Patient Instructions (Addendum)
Patient has been diagnosed with an acute respiratory infection.  Has had 2 negative home COVID test.  Rapid strep screen and rapid flu test are both negative.  COVID-19 test today is negative.  She is experiencing bilateral hand, shoulder and knee pain.  Has had recent surgery by orthopedist for carpal tunnel syndrome late July.  She has an appointment to see Dr. Corliss Skains for Rheumatology evaluation as a new patient on October 1.

## 2023-06-03 NOTE — ED Triage Notes (Signed)
Pt states that since March she's been having bilateral hand swelling. Pt states she's been diagnosed with sarcoidosis and has been intermittently on steroids which has helped, but PCP will not continue prescription. Pt also referring to multiple joints in triage that are painful and swollen. States that she has appointment on rheumatologist in October but unable to be seen earlier.

## 2023-06-03 NOTE — Telephone Encounter (Signed)
Called and spoke with Julia Solis because we had received and email from Dr Ulyess Mort a former colleague of Dr Iona Hansen   (Dear Foye Deer,   Your patient, Julia Solis, is my cousin.  I am writing (with her permission) because I am very concerned about her extreme fatigue, pain, and shortness of breath.  Joyce Gross is a very active person who is ordinarily taking care of multiple family members, and she is hardly able to get out of bed now.  Sometimes when I call, she is gasping for breath and she is barely able to perform simple ADLs.     I do not know what would help and I am certainly not writing with medical suggestions.  I just do not know if she is telling you clearly how severe her symptoms have become.    Ten years into retirement I rarely hear much about most of the colleagues I used to know.  I hope you are well.  Ulyess Mort)  I have schedule a video visit with the patient, we have done labs for the patient and she is waiting to set rheumatologist about the pain she is having.

## 2023-06-03 NOTE — ED Provider Notes (Signed)
[place] EMERGENCY DEPARTMENT AT [facility] Provider Note   CSN: [id number] Arrival date & time: 06/03/23  1329     History  Chief Complaint  Patient presents with   Hand Pain    Julia Solis is a 72 y.o. female with history of sick sinus syndrome with pacemaker, sarcoidosis, hypertension, presents with concern for hand pain, shoulder pain, ankle pain that has been ongoing since March 2024.  She states prior to this starting, she had COVID.  She also reports fatigue which makes her spend most the time in bed.  She was started on Medrol Dosepak by her primary which did help with her symptoms, reports taking this as 1 pill a day instead of how it was prescribed on the Dosepak.  Her primary also started her on oxycodone 5 mg daily since she was still having pain at the end of her Medrol Dosepak.  She was recommended to follow-up with rheumatology and reports she has an appointment scheduled for October.  No new worsening of her symptoms.  Denies any fevers or chills, chest pain, shortness of breath.   Hand Pain       Home Medications Prior to Admission medications   Medication Sig Start Date End Date Taking? Authorizing Provider  predniSONE (DELTASONE) 10 MG tablet Take 0.5 tablets (5 mg total) by mouth daily. 06/03/23 07/03/23 Yes Arabella Merles, PA-C  Cholecalciferol (VITAMIN D) 50 MCG (2000 UT) tablet Take 4,000 Units by mouth daily.    [provider]  famotidine (PEPCID) 10 MG tablet Take 20 mg by mouth daily as needed for heartburn or indigestion.    [provider]  furosemide (LASIX) 20 MG tablet Take 1 tablet (20 mg total) by mouth daily for 7 days. 06/03/23 06/10/23 Yes Arabella Merles, PA-C  loratadine (CLARITIN) 10 MG tablet Take 10 mg by mouth daily.    [provider]  LORazepam (ATIVAN) 1 MG tablet TAKE 1 TABLET BY MOUTH EVERY DAY AT BEDTIME AS NEEDED 05/05/22   Margaree Mackintosh, MD  losartan (COZAAR) 25 MG tablet TAKE 1 TABLET (25  MG TOTAL) BY MOUTH DAILY. 10/08/22   Margaree Mackintosh, MD  meloxicam (MOBIC) 15 MG tablet Take 1 tablet (15 mg total) by mouth as needed. PRN 01/24/23   Margaree Mackintosh, MD  oxyCODONE (OXY IR/ROXICODONE) 5 MG immediate release tablet Take 5 mg by mouth daily as needed. 05/05/23   [provider]  Polyethyl Glycol-Propyl Glycol (SYSTANE) 0.4-0.3 % GEL ophthalmic gel Place 1 application into both eyes in the morning and at bedtime. Patient not taking: Reported on 06/03/2023    [provider]  rosuvastatin (CRESTOR) 5 MG tablet TAKE 1 TABLET BY MOUTH 3 TIMES A WEEK WITH SUPPER Patient taking differently: 5 mg. TAKE 1 TABLET BY MOUTH 3 TIMES A WEEK WITH SUPPER 12/14/22   Margaree Mackintosh, MD      Allergies    Flomax [tamsulosin hcl], Latex, Tramadol, and Ciprofloxacin    Review of Systems   Review of Systems  Musculoskeletal:  Positive for arthralgias.    Physical Exam Updated Vital Signs BP (!) 179/93 (BP Location: Left Arm)   Pulse 83   Temp 98 F (36.7 C) (Oral)   Resp 18   SpO2 100%  Physical Exam Vitals and nursing note reviewed.  Constitutional:      Appearance: Normal appearance.  HENT:     Head: Atraumatic.  Cardiovascular:     Rate and Rhythm: Normal rate and regular  rhythm.     Comments: 2+ dorsalis pedis pulses bilaterally Pulmonary:     Effort: Pulmonary effort is normal.     Breath sounds: Normal breath sounds.  Musculoskeletal:     Comments: Normal range of motion of the hands, shoulders, ankles bilaterally Mild edema diffusely of the hands bilaterally Mild edema of the feet up to the mid calves bilaterally  Neurological:     General: No focal deficit present.     Mental Status: She is alert.  Psychiatric:        Mood and Affect: Mood normal.        Behavior: Behavior normal.     ED Results / Procedures / Treatments   Labs (all labs ordered are listed, but only abnormal results are displayed) Labs Reviewed  CBC WITH DIFFERENTIAL/PLATELET -  Abnormal; Notable for the following components:      Result Value   Hemoglobin 11.8 (*)    All other components within normal limits  COMPREHENSIVE METABOLIC PANEL - Abnormal; Notable for the following components:   Glucose, Bld 100 (*)    AST 13 (*)    All other components within normal limits  BRAIN NATRIURETIC PEPTIDE - Abnormal; Notable for the following components:   B Natriuretic Peptide 264.5 (*)    All other components within normal limits    EKG None  Radiology DG Chest 2 View  Result Date: 06/03/2023 CLINICAL DATA:  Bilateral hand swelling.  History of sarcoidosis. EXAM: CHEST - 2 VIEW COMPARISON:  November 20, 2022. FINDINGS: The heart size and mediastinal contours are within normal limits. Left-sided pacemaker is unchanged in position. Stable reticular densities are noted in both upper lobes consistent with scarring or architectural distortion. No acute pulmonary abnormality is noted. The visualized skeletal structures are unremarkable. IMPRESSION: Stable chronic findings seen involving upper lobes. No acute abnormality is noted. Electronically Signed   By: Lupita Raider M.D.   On: 06/03/2023 18:05    Procedures Procedures    Medications Ordered in ED Medications - No data to display  ED Course/ Medical Decision Making/ A&P Clinical Course as of 06/03/23 1917  Thu Jun 03, 2023  1728 This is a 72 year old female with a history of sarcoidosis presenting to ED with worsening dyspnea and swelling of the hands and legs for the past several weeks.  Patient reports she has a new appointment with a rheumatologist in October.  She reports her symptoms are better when she was on steroids prescribed her PCP for "respiratory infection" but she has run out of the steroids.  She had a televisit with her doctor today was concerned about her level of dyspnea and leg swelling and advised to come to the ED for further evaluation.  The patient denies any known history congestive heart  failure.  She reports he does have pulmonary hypertension and gets echocardiograms done with her pulmonologist about once a year.  On exam the patient is hypertensive, not hypoxic, no respiratory distress.  She does have edema and swelling of the upper and lower extremities with polyarthralgia, no large joint effusion.  Overall this presentation could be consistent with autoimmune disorder, which could include sarcoidosis which she describes as diffuse, versus polymyalgia rheumatica or a secondary condition.  I do think it is however reasonable to evaluate for evidence of heart failure, will order chest x-ray and a BNP while she is here.  I also think is reasonable to restart on a very low-dose of steroids, as she reports the 4  mg Medrol daily dosing was helping her, and we can do 5 mg prednisone as a maintenance dose for the next 30 days.  The patient and her daughter are in agreement [MT]    Clinical Course User Index [MT] Terald Sleeper, MD                                 Medical Decision Making  72 y.o. female with pertinent past medical history of sick sinus syndrome with pacemaker, sarcoidosis, hypertension, presents to the ED for concern of hand, shoulder, ankle pain ongoing since March 2024  Differential diagnosis includes but is not limited to rheumatoid arthritis, sarcoidosis, polymyalgia rheumatica, other rheumatological disease, heart failure  ED Course:  Patient presents with concern for bilateral hand pain and swelling, bilateral shoulder pain, bilateral ankle pain that has been ongoing since March 2024.  She has a history of sarcoidosis and her primary suspects this may be due to rheumatological issues, and has referred her to a rheumatologist.  She has an appointment in October.  She has been started on steroids and oxycodone by her primary provider in the meantime for her symptoms.  She reports her symptoms do improve with the steroids and oxycodone.  She has had no acute  worsening.  She does have some swelling in her bilateral lower extremities, however, she states this has been ongoing for multiple months.  It improves when she elevates her legs.  Denies any shortness of breath.  Her vital signs are within normal limits except for an elevated blood pressure upon arrival.  Her primary sent her to the ER for evaluation of possible heart failure.  However, patient not having any shortness of breath, no worsening of her lower extremity edema.  She is satting 99% on room air, breathing comfortably.  Her recent echo from September of 2024 shows EF of 60 to 65%. Low concern for heart failure exacerbation at this time, however, will obtain BNP CBC CMP and chest x-ray to rule out any heart failure exacerbation. Dr. Renaye Rakers also evaluated patient and is in agreement with the plan. Chest x-ray shows no acute abnormalities, BNP slightly elevated at 264.  Unsure with patient's baseline is.  Does not appear that she is in a heart failure exacerbation at this time given she has no oxygen requirement, no shortness of breath, no increase in her lower extremity swelling.  Had a shared decision-making conversation with the patient and will opt for a short 7-day course of furosemide 20 mg daily.  She understands that she needs to reach out to her cardiologist to schedule an echocardiogram to evaluate for heart failure. Prescribed 30-day course of 5 mg prednisone for her symptoms given that this has improved her symptoms previously and has tolerated them well. I discussed that she should continue on her medications as provided by her primary to help with her pain.  These include meloxicam, Tylenol, oxycodone that she takes daily.  She was encouraged to keep her appointment with the rheumatologist in October.  Impression: Bilateral hand, bilateral shoulder, bilateral ankle pain since March 2024 Elevated BNP  Disposition:  The patient was discharged home with instructions to contact her  cardiologist regarding echocardiogram, attend her rheumatology appointment in October, continue on pain medications as prescribed by her primary care provider. Return precautions given.  Labs: Labs ordered and independently reviewed by me which revealed slightly elevated BNP at 264, CBC and CMP unremarkable  Chest x-ray imaging was ordered and independently reviewed by me which revealed no acute abnormalities, no cardiomegaly    External records from outside source obtained and reviewed including echo from September 2044, primary care visit from today and 05/18/2023 showing she was on a medrol Dosepak   Co morbidities that complicate the patient evaluation  sick sinus syndrome with pacemaker, sarcoidosis, hypertension             Final Clinical Impression(s) / ED Diagnoses Final diagnoses:  Pain in both hands  Fatigue, unspecified type  Elevated brain natriuretic peptide (BNP) level    Rx / DC Orders ED Discharge Orders          Ordered    predniSONE (DELTASONE) 10 MG tablet  Daily        06/03/23 1842    furosemide (LASIX) 20 MG tablet  Daily        06/03/23 1907              Arabella Merles, Cordelia Poche 06/03/23 1917    Terald Sleeper, MD 06/04/23 [phone]

## 2023-06-03 NOTE — Patient Instructions (Signed)
Patient has been advised to present [facility] for evaluaton due to worsening shortness of breath in the setting of Cardiac pacemaker and hx of sarcoidosis. Has low titer positive ANA, is scheduled to see Pulmonologist at [facility] soon who follows her sarcoidosis. Has Rheumatology initial visit here

## 2023-06-03 NOTE — Progress Notes (Addendum)
Patient Care Team: Margaree Mackintosh, MD as PCP - General (Internal Medicine) Mealor, Roberts Gaudy, MD as Consulting Physician (Cardiology)  I connected with Julia Solis on 06/03/23 at 10:38 AM by video enabled telemedicine visit and verified that I am speaking with the correct person using two identifiers.   I discussed the limitations, risks, security and privacy concerns of performing an evaluation and management service by telemedicine and the availability of in-person appointments. I also discussed with the patient that there may be a patient responsible charge related to this service. The patient expressed understanding and agreed to proceed.   Other persons participating in the visit and their role in the encounter: Medical scribe, Doylene Bode  Patient's location: Home  Provider's location: Clinic   I provided 20 minutes of time today including extensive chart review, interviewing patient and medical decision making. She is identified using two identifiers, Julia Solis, a patient of this practice. She is in her home and I am in my practice. She is agreeable to using this format today.  Chief Complaint: pain, fatigue, shortness of breath  Subjective:    Patient ID: Julia Solis , Female    DOB: 12/27/1950, 72 y.o.    MRN: [id number]   72 y.o. Female presents today for pain in hands/shoulders/upper arms/knees, fatigue, swelling in ankles, shortness of breath. She finds that she is out of breath after walking down the stairs in the morning. History of Sarcoidosis followed by Pulmonologist at [facility].  This was diagnosed late December 2019 or early 2020.  There is a history of mild pulmonary hypertension.  Also has sleep apnea.    ENT has diagnosed her with GERD and laryngeal inflammation and she has been treated with H2 blocker.  Has been having persistent musculoskeletal pain now. Rheumatoid factor <14 on 01/11/23. She will be seeing her ENT specialist today.  She was seen here  July 8 for worsening pain, swelling in both hands.  Has had nerve conduction studies due to neuropathy in hands and diagnosed with carpal tunnel syndrome.  Had steroid injections every 12 weeks for this starting in April 2024.  Having swelling in both knees that limited her mobility.  Some pain in shoulders.  Sed rate was 25 in April 2024 with a positive ANA.  ANA titer was 1: 40 with pattern being nuclear fine speckled.  Rheumatoid factor was less than 14.  Remote history of kidney stones.  Bilateral hearing aids are worn by the patient.  She has a history of sick sinus syndrome, has pacemaker and is followed by Dr. Nelly Laurence.  In April, had an acute URI treated with Medrol Dosepak and Z-pak.  Her Pacemaker was checked in May.  Had surgical clearance by Pulmonology in July for carpal tunnel surgery. Also was dx with bilateral fourth trigger fingers. Underwent endoscopic right carpal tunnel release July 31st by Dr. Janee Morn with [facility].  History of early-stage ovarian cancer resected in 2011.  Has known since the 1990s that she had abnormal chest x-rays.  Had bone biopsy showing granulomatous inflammation late December 2019 early 2020.  Seen here in July and mid-August. Has appt with Dr. Corliss Skains, Rheumatologist, on October 1st. Is on cancellation list for sooner appt. A relative who is a physician saw her recently, messaged me with his concerns. She is basically housebound. Has fatigue and generalized musculoskeletal pain. Not significantly SOB but not very active due to pain.  Past Medical History:  Diagnosis Date   Cancer (HCC)  2011   ovarian   Complication of anesthesia    GERD (gastroesophageal reflux disease)    Hearing aid worn    both ears   History of chemotherapy 2011   History of hiatal hernia    History of kidney stones    Hypertension    Neuromuscular disorder (HCC)    left foot numbness since  eback surgery   Osteoporosis    PONV (postoperative nausea and  vomiting)    Presence of permanent cardiac pacemaker    Sarcoidosis    in remission sees pumonary  once a year dr bellinger lov 08-21-2020 care [facility]   Sick sinus syndrome (HCC)    Wears glasses      Family History  Problem Relation Age of Onset   Hypertension Mother    Prostate cancer Father 54   Cancer Father    Hypertension Father    Stroke Father    Colon polyps Sister    Colon cancer Maternal Grandmother        dx in her 54s   Heart disease Maternal Grandfather    Colon cancer Paternal Grandmother    Heart disease Paternal Grandfather    Breast cancer Paternal Aunt 90   Colon cancer Paternal Aunt        dx in her 71s   Melanoma Cousin 44   Breast cancer Cousin 71       multiple cousins   Colon polyps Cousin    Breast cancer Cousin 30   Breast cancer Cousin        father's maternal 1st cousin   Breast cancer Cousin        Father's first cousin's daughter   Cancer Other        maternal great uncle with Cancer - NOS   Cancer Other        maternal great grandmother with either colon or stomach cancer   Breast cancer Other        fathers maternal aunt    Social Hx: Resides alone. Divorced. Has 2 adult daughters. Does not smoke but did smoke from ages [age]. Social ETOH consumption. Retired. 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  Family Hx: Father died at age 20 with prostate cancer.  Brother died at age 69 of AVM rupture.  1 sister.   Review of Systems  Constitutional:  Positive for malaise/fatigue. Negative for fever.  HENT:  Negative for congestion.   Eyes:  Negative for blurred vision.  Respiratory:  Positive for shortness of breath. Negative for cough.   Cardiovascular:  Positive for leg swelling (Ankles). Negative for chest pain and palpitations.  Gastrointestinal:  Negative for vomiting.  Musculoskeletal:  Positive for joint pain (Knees) and myalgias. Negative for back pain.  Skin:  Negative for rash.  Neurological:   Negative for loss of consciousness and headaches.        Objective:   Vitals: Pulse 90   Temp 99.3 F (37.4 C)   Ht 5\' 4"  (1.626 m)   Wt 177 lb (80.3 kg)   SpO2 98%   BMI 30.38 kg/m    Physical Exam Vitals and nursing note reviewed.  Constitutional:      General: She is not in acute distress.    Appearance: Normal appearance. She is not toxic-appearing.     Comments: Slightly short of breath while speaking. No facial pallor.  HENT:     Head: Normocephalic and atraumatic.  Pulmonary:     Effort:  Pulmonary effort is normal.  Skin:    General: Skin is warm and dry.  Neurological:     Mental Status: She is alert and oriented to person, place, and time. Mental status is at baseline.  Psychiatric:        Mood and Affect: Mood normal.        Behavior: Behavior normal.        Thought Content: Thought content normal.        Judgment: Judgment normal.       Results:   Studies obtained and personally reviewed by me:   Labs:       Component Value Date/Time   NA 141 01/11/2023 1153   NA 144 10/28/2020 1636   K 4.3 01/11/2023 1153   CL 106 01/11/2023 1153   CO2 28 01/11/2023 1153   GLUCOSE 98 01/11/2023 1153   BUN 17 01/11/2023 1153   BUN 16 10/28/2020 1636   CREATININE 0.78 01/11/2023 1153   CALCIUM 8.6 01/11/2023 1153   PROT 5.3 (L) 01/11/2023 1153   ALBUMIN 4.1 04/12/2017 1036   AST 14 01/11/2023 1153   ALT 7 01/11/2023 1153   ALKPHOS 98 04/12/2017 1036   BILITOT 0.3 01/11/2023 1153   GFRNONAA 81 10/28/2020 1636   GFRNONAA 82 07/11/2020 0949   GFRAA 93 10/28/2020 1636   GFRAA 95 07/11/2020 0949     Lab Results  Component Value Date   WBC 10.2 01/11/2023   HGB 12.3 01/11/2023   HCT 37.5 01/11/2023   MCV 85.8 01/11/2023   PLT 201 01/11/2023    Lab Results  Component Value Date   CHOL 180 08/17/2022   HDL 53 08/17/2022   LDLCALC 108 (H) 08/17/2022   TRIG 99 08/17/2022   CHOLHDL 3.4 08/17/2022    No results found for: "HGBA1C"   Lab  Results  Component Value Date   TSH 2.58 08/17/2022      Assessment & Plan:   Dyspnea- initially thought to be related to sarcoidosis- however seems to be acutely worse.  Consider congestive heart failure or other etiology. Patient has been advised to present to [facility] location for evaluation immediately following her appointment with ENT, Dr. Suszanne Conners, today. She is short of breath with minimal exertion and resides alone. Pacemaker in place.    On 2/24 CXR showed chronic architectural distortion/scarring in the upper lobes, no acute cardiopulmonary findings.  Sarcoidosis: followed by pulmonologist, Dr. Philbert Riser at [facility]. Has upcoming appointment.  Polyarthralgias: appointment with rheumatologist in early October. Multiple rheumatology labs completed over past few months. Anti-Smith antibody negative but low positive titer ANA.  Malaise and Fatigue; Bilateral LE Edema: Etiology unclear at this point.    I,Alexander Ruley,acting as a Neurosurgeon for Margaree Mackintosh, MD.,have documented all relevant documentation on the behalf of Margaree Mackintosh, MD,as directed by  Margaree Mackintosh, MD while in the presence of Margaree Mackintosh, MD.   I, Margaree Mackintosh, MD, have reviewed all documentation for this visit. The documentation on 06/03/23 for the exam, diagnosis, procedures, and orders are all accurate and complete.

## 2023-06-03 NOTE — Discharge Instructions (Addendum)
Please attend your appointment at 9 AM on 07/06/2023 with Dr. Corliss Skains, rheumatologist.  Reach out to your cardiologist to schedule an echocardiogram.  This is for evaluation of possible heart failure. Your BNP was elevated today at 264  You have been prescribed prednisone.  You may take 5 mg of prednisone daily for the next 30 days.  You have been prescribed furosemide (Lasix) to help with your leg swelling.  You may take 20 mg daily for the next 7 days.  May also use compression stockings to help with your leg swelling.  Please elevate your legs above the level of your heart when sitting to help with your swelling.  Return to the ER if you have any acute or worsening shortness of breath, chest pain, any other new or concerning symptoms

## 2023-06-04 ENCOUNTER — Telehealth: Payer: Self-pay | Admitting: Cardiovascular Disease

## 2023-06-04 DIAGNOSIS — R0609 Other forms of dyspnea: Secondary | ICD-10-CM

## 2023-06-04 NOTE — Telephone Encounter (Signed)
Patient is requesting an order for an echo, per ED notes.

## 2023-06-04 NOTE — Telephone Encounter (Signed)
Returned Pts call. Let pt know we had got her message and I would be sending it over to Dr. Nelly Laurence and his Nurse for review.

## 2023-06-08 NOTE — Telephone Encounter (Signed)
Patient called to follow-up on getting echocardiogram ordered and stated she will need to get this test before she sees her pulmonologist on 9/16.

## 2023-06-08 NOTE — Telephone Encounter (Signed)
Patient states she is experiencing a lot of SOB with exertion and was seen in ED. She was instructed to reach out to Dr. Nelly Laurence to have an echocardiogram ordered.   Patient would like to have an echo performed prior to her appt with her pulmonologist on 06/21/23.

## 2023-06-09 ENCOUNTER — Ambulatory Visit (HOSPITAL_COMMUNITY): Payer: PPO | Attending: Internal Medicine

## 2023-06-09 DIAGNOSIS — R0609 Other forms of dyspnea: Secondary | ICD-10-CM | POA: Insufficient documentation

## 2023-06-09 LAB — ECHOCARDIOGRAM COMPLETE
Area-P 1/2: 2.6 cm2
S' Lateral: 2.9 cm

## 2023-06-09 NOTE — Addendum Note (Signed)
Addended by: Franchot Gallo on: 06/09/2023 08:58 AM   Modules accepted: Orders

## 2023-06-09 NOTE — Telephone Encounter (Signed)
Echocardiogram ordered for DOE, to assess for possible HF per ED recommendation.  Scheduler to call patient to set appt.

## 2023-06-10 NOTE — Progress Notes (Addendum)
Patient Care Team: Margaree Mackintosh, MD as PCP - General (Internal Medicine) Mealor, Roberts Gaudy, MD as Consulting Physician (Cardiology)  Visit Date: 06/11/23  Subjective:    Patient ID: Julia Solis , Female   DOB: [birth year], 72 y.o.    MRN: [id number]   72 y.o. Female presents today for ED follow-up. Seen here on 06/03/23 via video visit and, on my direction, she was subsequently seen in [facility] for worsening dyspnea and swelling of the hands and legs for the past several weeks. Chest X-ray showed no acute abnormality. BNP slightly elevated at 264. Discharged with prednisone 5 mg for thirty days, furosemide 20 mg daily for seven days. History of pulmonary sarcoidosis. Finished furosemide yesterday. Feet have been swelling when she is not elevating them. She has some pain in her ankles and wrists. Sees her pulmonologist, Dr. Edward Jolly, on 06/23/23. Has initial visit with Rheumatologist, Dr. Pollyann Savoy, on 07/06/23.   Appetite improved after starting prednisone 5 mg daily. She was having significant dyspnea yesterday that improved after taking additional steroid tablets. Now on 20 mg daily. She has difficulty walking to her mailbox.  06/09/23 echocardiogram showed left ventricle has normal function, right ventricular systolic function is normal, normal pulmonary artery systolic pressure, mitral valve is grossly normal, trivial mitral valve regurgitation, aortic valve is tricuspid, aortic valve regurgitation is not visualized, inferior vena cava is normal in size with greater than 50% respiratory variability, suggesting right atrial pressure of 3 mmHg. Followed by Cardiologist, Dr. Nelly Laurence.Recent echocardiogram reviewed with her. Hx of sick sinus syndrome.Hx of sleep apnea.  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   Complication of anesthesia    GERD (gastroesophageal reflux disease)    Hearing aid worn    both ears   History of chemotherapy  2011   History of hiatal hernia    History of kidney stones    Hypertension    Neuromuscular disorder (HCC)    left foot numbness since  eback surgery   Osteoporosis    PONV (postoperative nausea and vomiting)    Presence of permanent cardiac pacemaker    Sarcoidosis    in remission sees pumonary  once a year dr bellinger lov 08-21-2020 care [facility]   Sick sinus syndrome (HCC)    Wears glasses      Family History  Problem Relation Age of Onset   Hypertension Mother    Prostate cancer Father 35   Cancer Father    Hypertension Father    Stroke Father    Colon polyps Sister    Colon cancer Maternal Grandmother        dx in her 81s   Heart disease Maternal Grandfather    Colon cancer Paternal Grandmother    Heart disease Paternal Grandfather    Breast cancer Paternal Aunt 32   Colon cancer Paternal Aunt        dx in her 7s   Melanoma Cousin 54   Breast cancer Cousin 67       multiple cousins   Colon polyps Cousin    Breast cancer Cousin 65   Breast cancer Cousin        father's maternal 1st cousin   Breast cancer Cousin        Father's first cousin's daughter   Cancer Other        maternal great uncle with Cancer - NOS   Cancer Other  maternal great grandmother with either colon or stomach cancer   Breast cancer Other        fathers maternal aunt    Social Hx: 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 She resides alone. Has family in town.     Review of Systems  Constitutional:  Negative for fever and malaise/fatigue.  HENT:  Negative for congestion.   Eyes:  Negative for blurred vision.  Respiratory:  Negative for cough and shortness of breath.   Cardiovascular:  Positive for leg swelling. Negative for chest pain and palpitations.  Gastrointestinal:  Negative for vomiting.  Musculoskeletal:  Positive for joint pain (Ankles, hands). Negative for back pain.  Skin:  Negative for rash.  Neurological:  Negative for loss of  consciousness and headaches.        Objective:   Vitals: BP (!) 140/80   Pulse 67   Ht 5\' 4"  (1.626 m)   Wt 171 lb (77.6 kg)   SpO2 98%   BMI 29.35 kg/m    Physical Exam Vitals and nursing note reviewed.  Constitutional:      General: She is not in acute distress.    Appearance: Normal appearance. She is not toxic-appearing.  HENT:     Head: Normocephalic and atraumatic.  Pulmonary:     Effort: Pulmonary effort is normal.  Musculoskeletal:     Comments: Trace nonpitting edema of hands and lower extremities. Prominent Heberden's and Bouchard's nodes of hands.  Skin:    General: Skin is warm and dry.  Neurological:     Mental Status: She is alert and oriented to person, place, and time. Mental status is at baseline.  Psychiatric:        Mood and Affect: Mood normal.        Behavior: Behavior normal.        Thought Content: Thought content normal.        Judgment: Judgment normal.      Results:   Studies obtained and personally reviewed by me:  06/09/23 echocardiogram showed left ventricle has normal function, right ventricular systolic function is normal, normal pulmonary artery systolic pressure, mitral valve is grossly normal, trivial mitral valve regurgitation, aortic valve is tricuspid, aortic valve regurgitation is not visualized, inferior vena cava is normal in size with greater than 50% respiratory variability, suggesting right atrial pressure of 3 mmHg.  Chest X-ray showed no acute abnormality.  Labs:       Component Value Date/Time   NA 138 06/03/2023 1803   NA 144 10/28/2020 1636   K 4.1 06/03/2023 1803   CL 103 06/03/2023 1803   CO2 25 06/03/2023 1803   GLUCOSE 100 (H) 06/03/2023 1803   BUN 18 06/03/2023 1803   BUN 16 10/28/2020 1636   CREATININE 0.67 06/03/2023 1803   CREATININE 0.78 01/11/2023 1153   CALCIUM 9.1 06/03/2023 1803   PROT 6.7 06/03/2023 1803   ALBUMIN 3.6 06/03/2023 1803   AST 13 (L) 06/03/2023 1803   ALT 5 06/03/2023 1803    ALKPHOS 70 06/03/2023 1803   BILITOT 0.4 06/03/2023 1803   GFRNONAA >60 06/03/2023 1803   GFRNONAA 82 07/11/2020 0949   GFRAA 93 10/28/2020 1636   GFRAA 95 07/11/2020 0949     Lab Results  Component Value Date   WBC 9.4 06/03/2023   HGB 11.8 (L) 06/03/2023   HCT 37.0 06/03/2023   MCV 86.2 06/03/2023   PLT 309 06/03/2023    Lab Results  Component Value  Date   CHOL 180 08/17/2022   HDL 53 08/17/2022   LDLCALC 108 (H) 08/17/2022   TRIG 99 08/17/2022   CHOLHDL 3.4 08/17/2022    No results found for: "HGBA1C"   Lab Results  Component Value Date   TSH 2.58 08/17/2022      Assessment & Plan:   Dyspnea; Bilateral Edema Hands and LE; hospital/ED follow-up: History of sarcoidosis. Advised to take precautions to avoid exposure to infectious disease. Prescribed prednisone 10 - 20 mg daily with food until she sees her rheumatologist.   Ordered A1c and glucose to check for impaired glucose tolerance. Contact us if symptoms worsen.  Note: Hgb AIC is very slightly elevated. Watch carbohydrates in diet. Has very mild glucose intolerance.  Refilled Lasix 20 mg daily  for fluid retention/ edema.  Awaiting Rheumatology consultation scheduled for early October  Addendum: reviewed note from [facility] from 9/18. Pulmonologist at [facility]) thinks sarcoid is stable and not causing these issues.                 I,Alexander Ruley,acting as a Neurosurgeon for Margaree Mackintosh, MD.,have documented all relevant documentation on the behalf of Margaree Mackintosh, MD,as directed by  Margaree Mackintosh, MD while in the presence of Margaree Mackintosh, MD.   I, Margaree Mackintosh, MD, have reviewed all documentation for this visit. The documentation on 06/20/23 for the exam, diagnosis, procedures, and orders are all accurate and complete.

## 2023-06-11 ENCOUNTER — Ambulatory Visit (INDEPENDENT_AMBULATORY_CARE_PROVIDER_SITE_OTHER): Payer: PPO | Admitting: Internal Medicine

## 2023-06-11 ENCOUNTER — Encounter: Payer: Self-pay | Admitting: Internal Medicine

## 2023-06-11 ENCOUNTER — Telehealth: Payer: Self-pay | Admitting: Cardiovascular Disease

## 2023-06-11 VITALS — BP 140/80 | HR 67 | Ht 64.0 in | Wt 171.0 lb

## 2023-06-11 DIAGNOSIS — Z5181 Encounter for therapeutic drug level monitoring: Secondary | ICD-10-CM | POA: Diagnosis not present

## 2023-06-11 DIAGNOSIS — R768 Other specified abnormal immunological findings in serum: Secondary | ICD-10-CM | POA: Diagnosis not present

## 2023-06-11 DIAGNOSIS — Z7952 Long term (current) use of systemic steroids: Secondary | ICD-10-CM

## 2023-06-11 DIAGNOSIS — F419 Anxiety disorder, unspecified: Secondary | ICD-10-CM

## 2023-06-11 DIAGNOSIS — M2550 Pain in unspecified joint: Secondary | ICD-10-CM

## 2023-06-11 DIAGNOSIS — H905 Unspecified sensorineural hearing loss: Secondary | ICD-10-CM

## 2023-06-11 DIAGNOSIS — M19049 Primary osteoarthritis, unspecified hand: Secondary | ICD-10-CM

## 2023-06-11 DIAGNOSIS — R5383 Other fatigue: Secondary | ICD-10-CM

## 2023-06-11 DIAGNOSIS — Z862 Personal history of diseases of the blood and blood-forming organs and certain disorders involving the immune mechanism: Secondary | ICD-10-CM

## 2023-06-11 DIAGNOSIS — R609 Edema, unspecified: Secondary | ICD-10-CM

## 2023-06-11 DIAGNOSIS — M810 Age-related osteoporosis without current pathological fracture: Secondary | ICD-10-CM | POA: Diagnosis not present

## 2023-06-11 DIAGNOSIS — M79641 Pain in right hand: Secondary | ICD-10-CM | POA: Diagnosis not present

## 2023-06-11 DIAGNOSIS — M79642 Pain in left hand: Secondary | ICD-10-CM

## 2023-06-11 DIAGNOSIS — E7439 Other disorders of intestinal carbohydrate absorption: Secondary | ICD-10-CM

## 2023-06-11 DIAGNOSIS — Z950 Presence of cardiac pacemaker: Secondary | ICD-10-CM

## 2023-06-11 DIAGNOSIS — D869 Sarcoidosis, unspecified: Secondary | ICD-10-CM

## 2023-06-11 DIAGNOSIS — Z8543 Personal history of malignant neoplasm of ovary: Secondary | ICD-10-CM

## 2023-06-11 MED ORDER — PREDNISONE 10 MG PO TABS: ORAL_TABLET | ORAL | 0 refills | Status: DC

## 2023-06-11 MED ORDER — FUROSEMIDE 20 MG PO TABS: 20.0000 mg | ORAL_TABLET | Freq: Every day | ORAL | 1 refills | Status: DC

## 2023-06-11 NOTE — Telephone Encounter (Signed)
Julia Solis called from [facility] stating she spoke with Julia Solis at [facility] and they informed her that for the TPE order no prior Berkley Harvey is needed. Please advise

## 2023-06-12 LAB — HEMOGLOBIN A1C
Hgb A1c MFr Bld: 5.7 %{Hb} — ABNORMAL HIGH (ref <5.7)
Mean Plasma Glucose: 117 mg/dL
eAG (mmol/L): 6.5 mmol/L

## 2023-06-12 LAB — GLUCOSE, RANDOM: Glucose, Plasma: 93 mg/dL (ref 65–139)

## 2023-06-14 NOTE — Telephone Encounter (Signed)
Spoke with Trula Ore at [facility] concerning message left. No needs from her cardiology office, the pulmonologist wanted to ensure patient had TTE completed, this was done at our office on 06/09/23. No further needs

## 2023-06-15 ENCOUNTER — Telehealth: Payer: Self-pay | Admitting: Internal Medicine

## 2023-06-15 NOTE — Telephone Encounter (Signed)
Patient called and said she was seen last week and she wants to know if Dr Lenord Fellers wants her to get covid and flu vaccine or wait until after she sees the specialist recommended. Please advise

## 2023-06-16 DIAGNOSIS — D860 Sarcoidosis of lung: Secondary | ICD-10-CM | POA: Diagnosis not present

## 2023-06-17 DIAGNOSIS — G4733 Obstructive sleep apnea (adult) (pediatric): Secondary | ICD-10-CM | POA: Diagnosis not present

## 2023-06-21 ENCOUNTER — Other Ambulatory Visit: Payer: Self-pay | Admitting: Internal Medicine

## 2023-06-23 DIAGNOSIS — M25541 Pain in joints of right hand: Secondary | ICD-10-CM | POA: Diagnosis not present

## 2023-06-23 DIAGNOSIS — M25542 Pain in joints of left hand: Secondary | ICD-10-CM | POA: Diagnosis not present

## 2023-06-23 NOTE — Progress Notes (Signed)
Palpations: Abdomen is soft.  Musculoskeletal:     Cervical back: Normal range of motion.  Lymphadenopathy:     Cervical: No cervical adenopathy.  Skin:    General: Skin is warm and dry.     Capillary Refill: Capillary refill takes less than 2 seconds.     Comments: Erythematous macular rash was noted on the buttock region and right lower quadrant.  Neurological:     Mental Status: She is alert and oriented to person, place, and time.  Psychiatric:        Behavior: Behavior normal.      Musculoskeletal Exam: Cervical spine was in good range of motion.  Thoracic spine was in good range of motion.  She has some limitation range of motion of her lumbar spine.  She has surgical scar on her lumbar spine.  Shoulder joints, elbow joints, wrist joints with good range of motion.  She had synovitis and some of the MCP joints and tenderness over PIP joints.  Hip joints and knee joints were in good range of motion.  She had no difficulty getting out of chair.  There was no tenderness over ankles or MTPs.  Bunions were noted.  No synovitis was noted over her feet.  CDAI Exam: CDAI Score: -- Patient Global: --; Provider Global: -- Swollen: 3 ; Tender: 11  Joint Exam 07/06/2023      Right  Left  MCP 1  Swollen Tender     MCP 2  Swollen Tender  Swollen Tender  PIP 2 (finger)   Tender   Tender  PIP 3 (finger)   Tender   Tender  PIP 4 (finger)   Tender   Tender  PIP 5 (finger)   Tender   Tender     Investigation: No additional findings.  Imaging: XR Hand 2 View Left  Result Date: 07/06/2023 Juxta-articular osteopenia was noted.  CMC, PIP and DIP narrowing was noted.  No MCP, intercarpal or radiocarpal joint space narrowing was noted.  No erosive changes were noted. 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  XR Hand 2 View Right  Result Date: 07/06/2023 Juxta-articular osteopenia was noted.  CMC, PIP and DIP narrowing was noted.  No MCP, intercarpal or radiocarpal joint space narrowing was noted.  No erosive changes were noted. 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  ECHOCARDIOGRAM COMPLETE  Result Date: 06/09/2023    ECHOCARDIOGRAM REPORT   Patient Name:   Julia Solis [facility] Date of Exam: 06/09/2023 Medical Rec #:  [id number]         Height:       64.0 in Accession #:    [id number]        Weight:       177.0 lb Date of Birth:  [birth year]         BSA:          1.857 m Patient Age:    72 years          BP:           130/82 mmHg Patient Gender: F                 HR:           60 bpm. Exam Location:  [address] Procedure: 2D Echo, Color Doppler, Cardiac Doppler, 3D Echo and Strain Analysis Indications:    R06.9 DOE  History:        Patient has prior history  Office Visit Note  Patient: Dareth Andrew             Date of Birth: 1951-04-27           MRN: [id number]             PCP: Margaree Mackintosh, MD Referring: [place], Amy, MD Visit Date: 07/06/2023 Occupation: @GUAROCC @  Subjective:  Pain and swelling in both hands   History of Present Illness: Anjeli Casad is a 72 y.o. female seen in consultation per request of her dermatologist.  According the patient her symptoms started in October 2023 with a rash on her mid back which is spread to her abdominal region and buttock area.  She was referred to Dr. [place] at [facility] dermatology who did a skin biopsy and gave her topical agents.  The biopsy came consistent with eczema.  Patient states she had longstanding history of eczema.  She did respond to topical agents but the symptoms come and go.  In February 2024 she acquired COVID-19 virus infection.  She was treated with Paxlovid and had a rebound COVID.  In March 2024 she developed pain and swelling in her bilateral hands for which she was seen by Dr. Janee Morn at [facility] orth[facility].  She states he gave her cortisone injections in her hands and as she was having tingling and numbness in her hands he also did nerve conduction test which were consistent with bilateral carpal tunnel syndrome.  She had injections in her wrist as well.  She underwent right carpal tunnel release in July 2024 and he discussed future left carpal tunnel release.  She had Medrol Dosepak in April 2024 and again in July 2024 which helped with the joint swelling.  She states in August her symptoms got worse.  She was having pain and discomfort in the bilateral hands and swelling.  She was also experiencing generalized fatigue to the point she was having difficulty getting out of the bed and she could not drive.  She states she could not get to the chair due to weakness in her lower extremities.  She was having chills and night sweats.  She had decreased appetite and had weight  loss.  She states she has occasional discomfort in her ankles.  None of the other joints were involved.  She was seen in the emergency room on June 03, 2023 where she was placed on low-dose steroids.  She was followed by her PCP after that who placed her on prednisone 20 mg a day which she gradually tapered to 10 mg p.o. daily.  She has been on prednisone 10 mg p.o. daily for the last 3 weeks.  She states she continues to have some pain and stiffness in her bilateral hands and also some fatigue.  She has noticed some rash coming back on her abdominal region.  There is no history of oral ulcers, nasal ulcers, malar rash, photosensitivity, Raynaud's or lymphadenopathy.  She continues to have some shortness of breath.  She is followed by pulmonologist.  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 June 23, 2023 pulmonologist note: Chest x-ray from 2021 in 2012 showed adenopathy and consolidated area on the scan.  Abdominal images showed splenic lesions.  PET scan 2020 showed diffuse uptake of lymph nodes, spleen lungs and bones.  The bone biopsy showed granulomatous inflammation.  Patient was treated with steroids but no change in the appearance of lymph nodes  Office Visit Note  Patient: Dareth Andrew             Date of Birth: 1951-04-27           MRN: [id number]             PCP: Margaree Mackintosh, MD Referring: [place], Amy, MD Visit Date: 07/06/2023 Occupation: @GUAROCC @  Subjective:  Pain and swelling in both hands   History of Present Illness: Anjeli Casad is a 72 y.o. female seen in consultation per request of her dermatologist.  According the patient her symptoms started in October 2023 with a rash on her mid back which is spread to her abdominal region and buttock area.  She was referred to Dr. [place] at [facility] dermatology who did a skin biopsy and gave her topical agents.  The biopsy came consistent with eczema.  Patient states she had longstanding history of eczema.  She did respond to topical agents but the symptoms come and go.  In February 2024 she acquired COVID-19 virus infection.  She was treated with Paxlovid and had a rebound COVID.  In March 2024 she developed pain and swelling in her bilateral hands for which she was seen by Dr. Janee Morn at [facility] orthoped[facility].  She states he gave her cortisone injections in her hands and as she was having tingling and numbness in her hands he also did nerve conduction test which were consistent with bilateral carpal tunnel syndrome.  She had injections in her wrist as well.  She underwent right carpal tunnel release in July 2024 and he discussed future left carpal tunnel release.  She had Medrol Dosepak in April 2024 and again in July 2024 which helped with the joint swelling.  She states in August her symptoms got worse.  She was having pain and discomfort in the bilateral hands and swelling.  She was also experiencing generalized fatigue to the point she was having difficulty getting out of the bed and she could not drive.  She states she could not get to the chair due to weakness in her lower extremities.  She was having chills and night sweats.  She had decreased appetite and had weight  loss.  She states she has occasional discomfort in her ankles.  None of the other joints were involved.  She was seen in the emergency room on June 03, 2023 where she was placed on low-dose steroids.  She was followed by her PCP after that who placed her on prednisone 20 mg a day which she gradually tapered to 10 mg p.o. daily.  She has been on prednisone 10 mg p.o. daily for the last 3 weeks.  She states she continues to have some pain and stiffness in her bilateral hands and also some fatigue.  She has noticed some rash coming back on her abdominal region.  There is no history of oral ulcers, nasal ulcers, malar rash, photosensitivity, Raynaud's or lymphadenopathy.  She continues to have some shortness of breath.  She is followed by pulmonologist.  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 June 23, 2023 pulmonologist note: Chest x-ray from 2021 in 2012 showed adenopathy and consolidated area on the scan.  Abdominal images showed splenic lesions.  PET scan 2020 showed diffuse uptake of lymph nodes, spleen lungs and bones.  The bone biopsy showed granulomatous inflammation.  Patient was treated with steroids but no change in the appearance of lymph nodes  Palpations: Abdomen is soft.  Musculoskeletal:     Cervical back: Normal range of motion.  Lymphadenopathy:     Cervical: No cervical adenopathy.  Skin:    General: Skin is warm and dry.     Capillary Refill: Capillary refill takes less than 2 seconds.     Comments: Erythematous macular rash was noted on the buttock region and right lower quadrant.  Neurological:     Mental Status: She is alert and oriented to person, place, and time.  Psychiatric:        Behavior: Behavior normal.      Musculoskeletal Exam: Cervical spine was in good range of motion.  Thoracic spine was in good range of motion.  She has some limitation range of motion of her lumbar spine.  She has surgical scar on her lumbar spine.  Shoulder joints, elbow joints, wrist joints with good range of motion.  She had synovitis and some of the MCP joints and tenderness over PIP joints.  Hip joints and knee joints were in good range of motion.  She had no difficulty getting out of chair.  There was no tenderness over ankles or MTPs.  Bunions were noted.  No synovitis was noted over her feet.  CDAI Exam: CDAI Score: -- Patient Global: --; Provider Global: -- Swollen: 3 ; Tender: 11  Joint Exam 07/06/2023      Right  Left  MCP 1  Swollen Tender     MCP 2  Swollen Tender  Swollen Tender  PIP 2 (finger)   Tender   Tender  PIP 3 (finger)   Tender   Tender  PIP 4 (finger)   Tender   Tender  PIP 5 (finger)   Tender   Tender     Investigation: No additional findings.  Imaging: XR Hand 2 View Left  Result Date: 07/06/2023 Juxta-articular osteopenia was noted.  CMC, PIP and DIP narrowing was noted.  No MCP, intercarpal or radiocarpal joint space narrowing was noted.  No erosive changes were noted. 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  XR Hand 2 View Right  Result Date: 07/06/2023 Juxta-articular osteopenia was noted.  CMC, PIP and DIP narrowing was noted.  No MCP, intercarpal or radiocarpal joint space narrowing was noted.  No erosive changes were noted. 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  ECHOCARDIOGRAM COMPLETE  Result Date: 06/09/2023    ECHOCARDIOGRAM REPORT   Patient Name:   Julia Solis [facility] Date of Exam: 06/09/2023 Medical Rec #:  [id number]         Height:       64.0 in Accession #:    [id number]        Weight:       177.0 lb Date of Birth:  [birth year]         BSA:          1.857 m Patient Age:    72 years          BP:           130/82 mmHg Patient Gender: F                 HR:           60 bpm. Exam Location:  [address] Procedure: 2D Echo, Color Doppler, Cardiac Doppler, 3D Echo and Strain Analysis Indications:    R06.9 DOE  History:        Patient has prior history  Palpations: Abdomen is soft.  Musculoskeletal:     Cervical back: Normal range of motion.  Lymphadenopathy:     Cervical: No cervical adenopathy.  Skin:    General: Skin is warm and dry.     Capillary Refill: Capillary refill takes less than 2 seconds.     Comments: Erythematous macular rash was noted on the buttock region and right lower quadrant.  Neurological:     Mental Status: She is alert and oriented to person, place, and time.  Psychiatric:        Behavior: Behavior normal.      Musculoskeletal Exam: Cervical spine was in good range of motion.  Thoracic spine was in good range of motion.  She has some limitation range of motion of her lumbar spine.  She has surgical scar on her lumbar spine.  Shoulder joints, elbow joints, wrist joints with good range of motion.  She had synovitis and some of the MCP joints and tenderness over PIP joints.  Hip joints and knee joints were in good range of motion.  She had no difficulty getting out of chair.  There was no tenderness over ankles or MTPs.  Bunions were noted.  No synovitis was noted over her feet.  CDAI Exam: CDAI Score: -- Patient Global: --; Provider Global: -- Swollen: 3 ; Tender: 11  Joint Exam 07/06/2023      Right  Left  MCP 1  Swollen Tender     MCP 2  Swollen Tender  Swollen Tender  PIP 2 (finger)   Tender   Tender  PIP 3 (finger)   Tender   Tender  PIP 4 (finger)   Tender   Tender  PIP 5 (finger)   Tender   Tender     Investigation: No additional findings.  Imaging: XR Hand 2 View Left  Result Date: 07/06/2023 Juxta-articular osteopenia was noted.  CMC, PIP and DIP narrowing was noted.  No MCP, intercarpal or radiocarpal joint space narrowing was noted.  No erosive changes were noted. 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  XR Hand 2 View Right  Result Date: 07/06/2023 Juxta-articular osteopenia was noted.  CMC, PIP and DIP narrowing was noted.  No MCP, intercarpal or radiocarpal joint space narrowing was noted.  No erosive changes were noted. 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  ECHOCARDIOGRAM COMPLETE  Result Date: 06/09/2023    ECHOCARDIOGRAM REPORT   Patient Name:   Julia Solis [facility] Date of Exam: 06/09/2023 Medical Rec #:  [id number]         Height:       64.0 in Accession #:    [id number]        Weight:       177.0 lb Date of Birth:  [birth year]         BSA:          1.857 m Patient Age:    72 years          BP:           130/82 mmHg Patient Gender: F                 HR:           60 bpm. Exam Location:  [address] Procedure: 2D Echo, Color Doppler, Cardiac Doppler, 3D Echo and Strain Analysis Indications:    R06.9 DOE  History:        Patient has prior history  Palpations: Abdomen is soft.  Musculoskeletal:     Cervical back: Normal range of motion.  Lymphadenopathy:     Cervical: No cervical adenopathy.  Skin:    General: Skin is warm and dry.     Capillary Refill: Capillary refill takes less than 2 seconds.     Comments: Erythematous macular rash was noted on the buttock region and right lower quadrant.  Neurological:     Mental Status: She is alert and oriented to person, place, and time.  Psychiatric:        Behavior: Behavior normal.      Musculoskeletal Exam: Cervical spine was in good range of motion.  Thoracic spine was in good range of motion.  She has some limitation range of motion of her lumbar spine.  She has surgical scar on her lumbar spine.  Shoulder joints, elbow joints, wrist joints with good range of motion.  She had synovitis and some of the MCP joints and tenderness over PIP joints.  Hip joints and knee joints were in good range of motion.  She had no difficulty getting out of chair.  There was no tenderness over ankles or MTPs.  Bunions were noted.  No synovitis was noted over her feet.  CDAI Exam: CDAI Score: -- Patient Global: --; Provider Global: -- Swollen: 3 ; Tender: 11  Joint Exam 07/06/2023      Right  Left  MCP 1  Swollen Tender     MCP 2  Swollen Tender  Swollen Tender  PIP 2 (finger)   Tender   Tender  PIP 3 (finger)   Tender   Tender  PIP 4 (finger)   Tender   Tender  PIP 5 (finger)   Tender   Tender     Investigation: No additional findings.  Imaging: XR Hand 2 View Left  Result Date: 07/06/2023 Juxta-articular osteopenia was noted.  CMC, PIP and DIP narrowing was noted.  No MCP, intercarpal or radiocarpal joint space narrowing was noted.  No erosive changes were noted. 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  XR Hand 2 View Right  Result Date: 07/06/2023 Juxta-articular osteopenia was noted.  CMC, PIP and DIP narrowing was noted.  No MCP, intercarpal or radiocarpal joint space narrowing was noted.  No erosive changes were noted. 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  ECHOCARDIOGRAM COMPLETE  Result Date: 06/09/2023    ECHOCARDIOGRAM REPORT   Patient Name:   Julia Solis [facility] Date of Exam: 06/09/2023 Medical Rec #:  [id number]         Height:       64.0 in Accession #:    [id number]        Weight:       177.0 lb Date of Birth:  [birth year]         BSA:          1.857 m Patient Age:    72 years          BP:           130/82 mmHg Patient Gender: F                 HR:           60 bpm. Exam Location:  [address] Procedure: 2D Echo, Color Doppler, Cardiac Doppler, 3D Echo and Strain Analysis Indications:    R06.9 DOE  History:        Patient has prior history  Palpations: Abdomen is soft.  Musculoskeletal:     Cervical back: Normal range of motion.  Lymphadenopathy:     Cervical: No cervical adenopathy.  Skin:    General: Skin is warm and dry.     Capillary Refill: Capillary refill takes less than 2 seconds.     Comments: Erythematous macular rash was noted on the buttock region and right lower quadrant.  Neurological:     Mental Status: She is alert and oriented to person, place, and time.  Psychiatric:        Behavior: Behavior normal.      Musculoskeletal Exam: Cervical spine was in good range of motion.  Thoracic spine was in good range of motion.  She has some limitation range of motion of her lumbar spine.  She has surgical scar on her lumbar spine.  Shoulder joints, elbow joints, wrist joints with good range of motion.  She had synovitis and some of the MCP joints and tenderness over PIP joints.  Hip joints and knee joints were in good range of motion.  She had no difficulty getting out of chair.  There was no tenderness over ankles or MTPs.  Bunions were noted.  No synovitis was noted over her feet.  CDAI Exam: CDAI Score: -- Patient Global: --; Provider Global: -- Swollen: 3 ; Tender: 11  Joint Exam 07/06/2023      Right  Left  MCP 1  Swollen Tender     MCP 2  Swollen Tender  Swollen Tender  PIP 2 (finger)   Tender   Tender  PIP 3 (finger)   Tender   Tender  PIP 4 (finger)   Tender   Tender  PIP 5 (finger)   Tender   Tender     Investigation: No additional findings.  Imaging: XR Hand 2 View Left  Result Date: 07/06/2023 Juxta-articular osteopenia was noted.  CMC, PIP and DIP narrowing was noted.  No MCP, intercarpal or radiocarpal joint space narrowing was noted.  No erosive changes were noted. 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  XR Hand 2 View Right  Result Date: 07/06/2023 Juxta-articular osteopenia was noted.  CMC, PIP and DIP narrowing was noted.  No MCP, intercarpal or radiocarpal joint space narrowing was noted.  No erosive changes were noted. 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  ECHOCARDIOGRAM COMPLETE  Result Date: 06/09/2023    ECHOCARDIOGRAM REPORT   Patient Name:   Julia Solis [facility] Date of Exam: 06/09/2023 Medical Rec #:  [id number]         Height:       64.0 in Accession #:    [id number]        Weight:       177.0 lb Date of Birth:  [birth year]         BSA:          1.857 m Patient Age:    72 years          BP:           130/82 mmHg Patient Gender: F                 HR:           60 bpm. Exam Location:  [address] Procedure: 2D Echo, Color Doppler, Cardiac Doppler, 3D Echo and Strain Analysis Indications:    R06.9 DOE  History:        Patient has prior history  Palpations: Abdomen is soft.  Musculoskeletal:     Cervical back: Normal range of motion.  Lymphadenopathy:     Cervical: No cervical adenopathy.  Skin:    General: Skin is warm and dry.     Capillary Refill: Capillary refill takes less than 2 seconds.     Comments: Erythematous macular rash was noted on the buttock region and right lower quadrant.  Neurological:     Mental Status: She is alert and oriented to person, place, and time.  Psychiatric:        Behavior: Behavior normal.      Musculoskeletal Exam: Cervical spine was in good range of motion.  Thoracic spine was in good range of motion.  She has some limitation range of motion of her lumbar spine.  She has surgical scar on her lumbar spine.  Shoulder joints, elbow joints, wrist joints with good range of motion.  She had synovitis and some of the MCP joints and tenderness over PIP joints.  Hip joints and knee joints were in good range of motion.  She had no difficulty getting out of chair.  There was no tenderness over ankles or MTPs.  Bunions were noted.  No synovitis was noted over her feet.  CDAI Exam: CDAI Score: -- Patient Global: --; Provider Global: -- Swollen: 3 ; Tender: 11  Joint Exam 07/06/2023      Right  Left  MCP 1  Swollen Tender     MCP 2  Swollen Tender  Swollen Tender  PIP 2 (finger)   Tender   Tender  PIP 3 (finger)   Tender   Tender  PIP 4 (finger)   Tender   Tender  PIP 5 (finger)   Tender   Tender     Investigation: No additional findings.  Imaging: XR Hand 2 View Left  Result Date: 07/06/2023 Juxta-articular osteopenia was noted.  CMC, PIP and DIP narrowing was noted.  No MCP, intercarpal or radiocarpal joint space narrowing was noted.  No erosive changes were noted. 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  XR Hand 2 View Right  Result Date: 07/06/2023 Juxta-articular osteopenia was noted.  CMC, PIP and DIP narrowing was noted.  No MCP, intercarpal or radiocarpal joint space narrowing was noted.  No erosive changes were noted. 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  ECHOCARDIOGRAM COMPLETE  Result Date: 06/09/2023    ECHOCARDIOGRAM REPORT   Patient Name:   Julia Solis [facility] Date of Exam: 06/09/2023 Medical Rec #:  [id number]         Height:       64.0 in Accession #:    [id number]        Weight:       177.0 lb Date of Birth:  [birth year]         BSA:          1.857 m Patient Age:    72 years          BP:           130/82 mmHg Patient Gender: F                 HR:           60 bpm. Exam Location:  [address] Procedure: 2D Echo, Color Doppler, Cardiac Doppler, 3D Echo and Strain Analysis Indications:    R06.9 DOE  History:        Patient has prior history  Palpations: Abdomen is soft.  Musculoskeletal:     Cervical back: Normal range of motion.  Lymphadenopathy:     Cervical: No cervical adenopathy.  Skin:    General: Skin is warm and dry.     Capillary Refill: Capillary refill takes less than 2 seconds.     Comments: Erythematous macular rash was noted on the buttock region and right lower quadrant.  Neurological:     Mental Status: She is alert and oriented to person, place, and time.  Psychiatric:        Behavior: Behavior normal.      Musculoskeletal Exam: Cervical spine was in good range of motion.  Thoracic spine was in good range of motion.  She has some limitation range of motion of her lumbar spine.  She has surgical scar on her lumbar spine.  Shoulder joints, elbow joints, wrist joints with good range of motion.  She had synovitis and some of the MCP joints and tenderness over PIP joints.  Hip joints and knee joints were in good range of motion.  She had no difficulty getting out of chair.  There was no tenderness over ankles or MTPs.  Bunions were noted.  No synovitis was noted over her feet.  CDAI Exam: CDAI Score: -- Patient Global: --; Provider Global: -- Swollen: 3 ; Tender: 11  Joint Exam 07/06/2023      Right  Left  MCP 1  Swollen Tender     MCP 2  Swollen Tender  Swollen Tender  PIP 2 (finger)   Tender   Tender  PIP 3 (finger)   Tender   Tender  PIP 4 (finger)   Tender   Tender  PIP 5 (finger)   Tender   Tender     Investigation: No additional findings.  Imaging: XR Hand 2 View Left  Result Date: 07/06/2023 Juxta-articular osteopenia was noted.  CMC, PIP and DIP narrowing was noted.  No MCP, intercarpal or radiocarpal joint space narrowing was noted.  No erosive changes were noted. 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  XR Hand 2 View Right  Result Date: 07/06/2023 Juxta-articular osteopenia was noted.  CMC, PIP and DIP narrowing was noted.  No MCP, intercarpal or radiocarpal joint space narrowing was noted.  No erosive changes were noted. 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  ECHOCARDIOGRAM COMPLETE  Result Date: 06/09/2023    ECHOCARDIOGRAM REPORT   Patient Name:   Julia Solis [facility] Date of Exam: 06/09/2023 Medical Rec #:  [id number]         Height:       64.0 in Accession #:    [id number]        Weight:       177.0 lb Date of Birth:  [birth year]         BSA:          1.857 m Patient Age:    72 years          BP:           130/82 mmHg Patient Gender: F                 HR:           60 bpm. Exam Location:  [address] Procedure: 2D Echo, Color Doppler, Cardiac Doppler, 3D Echo and Strain Analysis Indications:    R06.9 DOE  History:        Patient has prior history  Office Visit Note  Patient: Dareth Andrew             Date of Birth: 1951-04-27           MRN: [id number]             PCP: Margaree Mackintosh, MD Referring: [place], Amy, MD Visit Date: 07/06/2023 Occupation: @GUAROCC @  Subjective:  Pain and swelling in both hands   History of Present Illness: Anjeli Casad is a 72 y.o. female seen in consultation per request of her dermatologist.  According the patient her symptoms started in October 2023 with a rash on her mid back which is spread to her abdominal region and buttock area.  She was referred to Dr. Swaziland at [facility] dermatology who did a skin biopsy and gave her topical agents.  The biopsy came consistent with eczema.  Patient states she had longstanding history of eczema.  She did respond to topical agents but the symptoms come and go.  In February 2024 she acquired COVID-19 virus infection.  She was treated with Paxlovid and had a rebound COVID.  In March 2024 she developed pain and swelling in her bilateral hands for which she was seen by Dr. Janee Morn at [facility] orthopedics.  She states he gave her cortisone injections in her hands and as she was having tingling and numbness in her hands he also did nerve conduction test which were consistent with bilateral carpal tunnel syndrome.  She had injections in her wrist as well.  She underwent right carpal tunnel release in July 2024 and he discussed future left carpal tunnel release.  She had Medrol Dosepak in April 2024 and again in July 2024 which helped with the joint swelling.  She states in August her symptoms got worse.  She was having pain and discomfort in the bilateral hands and swelling.  She was also experiencing generalized fatigue to the point she was having difficulty getting out of the bed and she could not drive.  She states she could not get to the chair due to weakness in her lower extremities.  She was having chills and night sweats.  She had decreased appetite and had weight  loss.  She states she has occasional discomfort in her ankles.  None of the other joints were involved.  She was seen in the emergency room on June 03, 2023 where she was placed on low-dose steroids.  She was followed by her PCP after that who placed her on prednisone 20 mg a day which she gradually tapered to 10 mg p.o. daily.  She has been on prednisone 10 mg p.o. daily for the last 3 weeks.  She states she continues to have some pain and stiffness in her bilateral hands and also some fatigue.  She has noticed some rash coming back on her abdominal region.  There is no history of oral ulcers, nasal ulcers, malar rash, photosensitivity, Raynaud's or lymphadenopathy.  She continues to have some shortness of breath.  She is followed by pulmonologist.  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 June 23, 2023 pulmonologist note: Chest x-ray from 2021 in 2012 showed adenopathy and consolidated area on the scan.  Abdominal images showed splenic lesions.  PET scan 2020 showed diffuse uptake of lymph nodes, spleen lungs and bones.  The bone biopsy showed granulomatous inflammation.  Patient was treated with steroids but no change in the appearance of lymph nodes

## 2023-06-26 ENCOUNTER — Encounter: Payer: Self-pay | Admitting: Internal Medicine

## 2023-06-26 DIAGNOSIS — R609 Edema, unspecified: Secondary | ICD-10-CM | POA: Insufficient documentation

## 2023-06-26 NOTE — Patient Instructions (Signed)
Awaiting Rheumatology consultation and will be seeing Pulmonologist soon. Would like to know if sarcoid is playing a role in these symptoms. Will stay on low dose prednisone until she sees Rheumatology in early October.

## 2023-06-29 NOTE — Progress Notes (Signed)
Remote pacemaker transmission.   

## 2023-07-01 DIAGNOSIS — H2513 Age-related nuclear cataract, bilateral: Secondary | ICD-10-CM | POA: Diagnosis not present

## 2023-07-06 ENCOUNTER — Ambulatory Visit: Payer: PPO

## 2023-07-06 ENCOUNTER — Ambulatory Visit: Payer: PPO | Attending: Rheumatology | Admitting: Rheumatology

## 2023-07-06 ENCOUNTER — Encounter: Payer: Self-pay | Admitting: Rheumatology

## 2023-07-06 ENCOUNTER — Ambulatory Visit (INDEPENDENT_AMBULATORY_CARE_PROVIDER_SITE_OTHER): Payer: PPO

## 2023-07-06 VITALS — BP 164/86 | HR 66 | Resp 16 | Ht 64.5 in | Wt 173.0 lb

## 2023-07-06 DIAGNOSIS — Z79899 Other long term (current) drug therapy: Secondary | ICD-10-CM

## 2023-07-06 DIAGNOSIS — R5383 Other fatigue: Secondary | ICD-10-CM

## 2023-07-06 DIAGNOSIS — M79642 Pain in left hand: Secondary | ICD-10-CM | POA: Diagnosis not present

## 2023-07-06 DIAGNOSIS — M791 Myalgia, unspecified site: Secondary | ICD-10-CM | POA: Diagnosis not present

## 2023-07-06 DIAGNOSIS — M79641 Pain in right hand: Secondary | ICD-10-CM

## 2023-07-06 DIAGNOSIS — M797 Fibromyalgia: Secondary | ICD-10-CM

## 2023-07-06 DIAGNOSIS — M810 Age-related osteoporosis without current pathological fracture: Secondary | ICD-10-CM

## 2023-07-06 DIAGNOSIS — U071 COVID-19: Secondary | ICD-10-CM

## 2023-07-06 DIAGNOSIS — E089 Diabetes mellitus due to underlying condition without complications: Secondary | ICD-10-CM

## 2023-07-06 DIAGNOSIS — R21 Rash and other nonspecific skin eruption: Secondary | ICD-10-CM | POA: Diagnosis not present

## 2023-07-06 DIAGNOSIS — Z950 Presence of cardiac pacemaker: Secondary | ICD-10-CM

## 2023-07-06 DIAGNOSIS — R768 Other specified abnormal immunological findings in serum: Secondary | ICD-10-CM

## 2023-07-06 DIAGNOSIS — I272 Pulmonary hypertension, unspecified: Secondary | ICD-10-CM

## 2023-07-06 DIAGNOSIS — M545 Low back pain, unspecified: Secondary | ICD-10-CM

## 2023-07-06 DIAGNOSIS — M8589 Other specified disorders of bone density and structure, multiple sites: Secondary | ICD-10-CM

## 2023-07-06 DIAGNOSIS — Z8719 Personal history of other diseases of the digestive system: Secondary | ICD-10-CM

## 2023-07-06 DIAGNOSIS — G4733 Obstructive sleep apnea (adult) (pediatric): Secondary | ICD-10-CM

## 2023-07-06 DIAGNOSIS — D869 Sarcoidosis, unspecified: Secondary | ICD-10-CM

## 2023-07-06 DIAGNOSIS — F338 Other recurrent depressive disorders: Secondary | ICD-10-CM

## 2023-07-06 DIAGNOSIS — H18453 Nodular corneal degeneration, bilateral: Secondary | ICD-10-CM

## 2023-07-06 DIAGNOSIS — M2550 Pain in unspecified joint: Secondary | ICD-10-CM

## 2023-07-06 DIAGNOSIS — G4709 Other insomnia: Secondary | ICD-10-CM | POA: Diagnosis not present

## 2023-07-06 DIAGNOSIS — H04123 Dry eye syndrome of bilateral lacrimal glands: Secondary | ICD-10-CM

## 2023-07-06 DIAGNOSIS — G8929 Other chronic pain: Secondary | ICD-10-CM

## 2023-07-06 DIAGNOSIS — R001 Bradycardia, unspecified: Secondary | ICD-10-CM

## 2023-07-06 DIAGNOSIS — C569 Malignant neoplasm of unspecified ovary: Secondary | ICD-10-CM

## 2023-07-13 LAB — BASIC METABOLIC PANEL WITH GFR
BUN: 23 mg/dL (ref 7–25)
CO2: 25 mmol/L (ref 20–32)
Calcium: 8.9 mg/dL (ref 8.6–10.4)
Chloride: 106 mmol/L (ref 98–110)
Creat: 0.76 mg/dL (ref 0.60–1.00)
Glucose, Bld: 84 mg/dL (ref 65–99)
Potassium: 3.7 mmol/L (ref 3.5–5.3)
Sodium: 141 mmol/L (ref 135–146)
eGFR: 84 mL/min/{1.73_m2} (ref >=60)

## 2023-07-13 LAB — SJOGRENS SYNDROME-A EXTRACTABLE NUCLEAR ANTIBODY: SSA (Ro) (ENA) Antibody, IgG: <1 AI

## 2023-07-13 LAB — QUANTIFERON-TB GOLD PLUS
Mitogen-NIL: 3.94 [IU]/mL
NIL: 0.03 [IU]/mL
QuantiFERON-TB Gold Plus: NEGATIVE
TB1-NIL: 0 [IU]/mL
TB2-NIL: <0 [IU]/mL

## 2023-07-13 LAB — PROTEIN ELECTROPHORESIS, SERUM, WITH REFLEX
Albumin ELP: 3.5 g/dL — ABNORMAL LOW (ref 3.8–4.8)
Alpha 1: 0.3 g/dL (ref 0.2–0.3)
Alpha 2: 0.7 g/dL (ref 0.5–0.9)
Beta 2: 0.2 g/dL (ref 0.2–0.5)
Beta Globulin: 0.6 g/dL (ref 0.4–0.6)
Gamma Globulin: 0.7 g/dL — ABNORMAL LOW (ref 0.8–1.7)
Total Protein: 6 g/dL — ABNORMAL LOW (ref 6.1–8.1)

## 2023-07-13 LAB — ANA: Anti Nuclear Antibody (ANA): POSITIVE — AB

## 2023-07-13 LAB — HEPATITIS B CORE ANTIBODY, IGM: Hep B C IgM: NONREACTIVE

## 2023-07-13 LAB — C3 AND C4
C3 Complement: 131 mg/dL (ref 83–193)
C4 Complement: 23 mg/dL (ref 15–57)

## 2023-07-13 LAB — HEPATITIS C ANTIBODY: Hepatitis C Ab: NONREACTIVE

## 2023-07-13 LAB — IFE INTERPRETATION

## 2023-07-13 LAB — ANTI-NUCLEAR AB-TITER (ANA TITER): ANA Titer 1: 1:80 {titer} — ABNORMAL HIGH

## 2023-07-13 LAB — ANTI-DNA ANTIBODY, DOUBLE-STRANDED: ds DNA Ab: <1 [IU]/mL

## 2023-07-13 LAB — ANTI-SCLERODERMA ANTIBODY: Scleroderma (Scl-70) (ENA) Antibody, IgG: <1 AI

## 2023-07-13 LAB — IGG, IGA, IGM
IgG (Immunoglobin G), Serum: 864 mg/dL (ref 600–1540)
IgM, Serum: 78 mg/dL (ref 50–300)
Immunoglobulin A: 111 mg/dL (ref 70–320)

## 2023-07-13 LAB — HEPATITIS B SURFACE ANTIGEN: Hepatitis B Surface Ag: NONREACTIVE

## 2023-07-13 LAB — SEDIMENTATION RATE: Sed Rate: 14 mm/h (ref 0–30)

## 2023-07-13 LAB — RNP ANTIBODY: Ribonucleic Protein(ENA) Antibody, IgG: <1 AI

## 2023-07-13 LAB — CK: Total CK: 22 U/L — ABNORMAL LOW (ref 29–143)

## 2023-07-13 LAB — SJOGRENS SYNDROME-B EXTRACTABLE NUCLEAR ANTIBODY: SSB (La) (ENA) Antibody, IgG: <1 AI

## 2023-07-17 DIAGNOSIS — G4733 Obstructive sleep apnea (adult) (pediatric): Secondary | ICD-10-CM | POA: Diagnosis not present

## 2023-07-21 ENCOUNTER — Other Ambulatory Visit: Payer: Self-pay | Admitting: *Deleted

## 2023-07-21 ENCOUNTER — Telehealth: Payer: Self-pay | Admitting: *Deleted

## 2023-07-21 DIAGNOSIS — R778 Other specified abnormalities of plasma proteins: Secondary | ICD-10-CM

## 2023-07-21 NOTE — Progress Notes (Signed)
Office Visit Note  Patient: Julia Solis             Date of Birth: 1951/02/25           MRN: [id number]             PCP: Margaree Mackintosh, MD Referring: Margaree Mackintosh, MD Visit Date: 08/04/2023 Occupation: @GUAROCC @  Subjective:  Pain in both hands  History of Present Illness: Kyiara Trank is a 72 y.o. female with seronegative inflammatory arthritis and myalgias.  She returns today after her initial visit on July 06, 2023.  Patient states that she continues to have pain and stiffness in her bilateral hands.  She has been taking prednisone 10 mg p.o. daily now.  She also states that she sprained her back recently and has been having left-sided radiculopathy.  She was seen by an orthopedic surgeon and was given a prednisone taper starting at 60 mg which helped her symptoms a lot.  She is still having left-sided radiculopathy and having difficulty walking.  She states her symptoms started after picking up her granddaughter.  Her fatigue and generalized muscle pain has been better since she has been on prednisone.    Activities of Daily Living:  Patient reports morning stiffness for 2-3 hours.   Patient Reports nocturnal pain.  Difficulty dressing/grooming: Denies Difficulty climbing stairs: Reports Difficulty getting out of chair: Reports Difficulty using hands for taps, buttons, cutlery, and/or writing: Reports  Review of Systems  Constitutional:  Positive for fatigue.  HENT:  Negative for mouth sores and mouth dryness.   Eyes:  Positive for dryness. Negative for pain, redness and itching.  Respiratory:  Negative for shortness of breath.   Cardiovascular:  Negative for chest pain and palpitations.  Gastrointestinal:  Negative for blood in stool, constipation and diarrhea.  Endocrine: Negative for increased urination.  Genitourinary:  Negative for involuntary urination.  Musculoskeletal:  Positive for joint pain, gait problem, joint pain, myalgias, muscle weakness,  morning stiffness, muscle tenderness and myalgias. Negative for joint swelling.  Skin:  Positive for color change. Negative for rash, hair loss and sensitivity to sunlight.  Allergic/Immunologic: Negative for susceptible to infections.  Neurological:  Positive for numbness. Negative for dizziness and headaches.  Hematological:  Negative for swollen glands.  Psychiatric/Behavioral:  Positive for sleep disturbance. Negative for depressed mood. The patient is not nervous/anxious.     PMFS History:  Patient Active Problem List   Diagnosis Date Noted   Monoclonal gammopathy of unknown significance 08/03/2023   Primary osteoarthritis involving multiple joints 08/03/2023   Edema 06/26/2023   Hand arthritis 04/27/2023   Symptomatic sinus bradycardia 03/20/2022   Pacemaker 03/20/2022   Dry eye syndrome 10/16/2015   NS (nuclear sclerosis) 10/16/2015   Dystrophy, 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   Dry eye syndrome of both lacrimal glands 10/16/2015   Elevated LDL cholesterol level 02/26/2015   Back pain 02/20/2014   Insomnia 02/20/2014   Allergic rhinitis 02/20/2014   Osteopenia 07/07/2012   Fatigue 08/17/2011   Nonspecific (abnormal) findings on radiological and other examination of body structure 06/24/2010   ABNORMAL LUNG XRAY 06/24/2010   History of ovarian cancer 06/23/2010    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   Complication of anesthesia    GERD (gastroesophageal reflux disease)    Hearing aid worn    both ears   History of chemotherapy 2011   History of hiatal hernia    History of  kidney stones    Hypertension    MGUS (monoclonal gammopathy of unknown significance)    dx by hematology per patient   Neuromuscular disorder (HCC)    left foot numbness since  eback surgery   Osteoporosis    PONV (postoperative nausea and vomiting)    Presence of permanent cardiac pacemaker    Sarcoidosis    in remission sees pumonary  once a year dr bellinger  lov 08-21-2020 care [facility]   Sick sinus syndrome (HCC)    Wears glasses     Family History  Problem Relation Age of Onset   Hypertension Mother    Prostate cancer Father 93   Cancer Father    Hypertension Father    Stroke Father    Colon polyps Sister    Colon cancer Maternal Grandmother        dx in her 30s   Heart disease Maternal Grandfather    Colon cancer Paternal Grandmother    Heart disease Paternal Grandfather    Breast cancer Paternal Aunt 8   Colon cancer Paternal Aunt        dx in her 22s   Melanoma Cousin 62   Breast cancer Cousin 42       multiple cousins   Colon polyps Cousin    Breast cancer Cousin 63   Breast cancer Cousin        father's maternal 1st cousin   Breast cancer Cousin        Father's first cousin's daughter   Cancer Other        maternal great uncle with Cancer - NOS   Cancer Other        maternal great grandmother with either colon or stomach cancer   Breast cancer Other        fathers maternal aunt   Past Surgical History:  Procedure Laterality Date   ABDOMINAL HYSTERECTOMY  2011   APPENDECTOMY  2011    ?with hysterectomy   BACK SURGERY  1997   lumbar   CARPAL TUNNEL RELEASE Right    CESAREAN SECTION  1982, 1985   COSMETIC SURGERY  2007   brow lift   CYSTOSCOPY WITH RETROGRADE PYELOGRAM, URETEROSCOPY AND STENT PLACEMENT Left 02/04/2021   Procedure: CYSTOSCOPY WITH RETROGRADE PYELOGRAM, URETEROSCOPY AND STENT PLACEMENT;  Surgeon: Belva Agee, MD;  Location: [facility];  Service: Urology;  Laterality: Left;  1 HR; nephrolithiasis   ECTOPIC PREGNANCY SURGERY  1980   EXTRACORPOREAL SHOCK WAVE LITHOTRIPSY Left 06/23/2018   Procedure: LEFT EXTRACORPOREAL SHOCK WAVE LITHOTRIPSY (ESWL);  Surgeon: Malen Gauze, MD;  Location: [facility];  Service: Urology;  Laterality: Left;   GANGLION CYST EXCISION  1975   HOLMIUM LASER APPLICATION Left 02/04/2021   Procedure: HOLMIUM LASER APPLICATION;  Surgeon: Belva Agee, MD;  Location[facility];  Service: Urology;  Laterality: Left;   PACEMAKER IMPLANT N/A 11/13/2020   Procedure: PACEMAKER IMPLANT;  Surgeon: Hillis Range, MD;  Location: [facility];  Service: Cardiovascular;  Laterality: N/A;   TONSILLECTOMY  1957   adenoids removed   Social History   Social History Narrative   Not on file   Immunization History  Administered Date(s) Administered   Fluad Quad(high Dose 65+) 06/22/2019, 06/15/2022   Influenza Nasal 07/06/2023   Influenza Split 07/07/2011, 07/07/2012, 07/04/2013   Influenza, High Dose Seasonal PF 06/22/2019, 07/01/2020, 06/17/2021, 12/16/2021   Influenza,inj,Quad PF,6+ Mos 07/09/2018   Influenza-Unspecified 07/05/2014, 06/26/2015, 07/05/2016, 07/29/2017   Moderna  Sars-Covid-2 Vaccination 11/07/2019, 12/07/2019, 07/29/2020, 07/06/2023   PFIZER Comirnaty(Gray Top)Covid-19 Tri-Sucrose Vaccine 01/11/2021   Pfizer Covid-19 Vaccine Bivalent Booster 43yrs & up 06/17/2021, 01/27/2022   Pneumococcal Conjugate-13 02/26/2015, 04/19/2017   Pneumococcal Polysaccharide-23 10/05/2009, 07/11/2018, 12/16/2021   Respiratory Syncytial Virus Vaccine,Recomb Aduvanted(Arexvy) 07/16/2022   Tdap 06/05/2009, 03/19/2020   Unspecified SARS-COV-2 Vaccination 06/23/2022   Zoster Recombinant(Shingrix) 07/20/2023   Zoster, Live 10/05/2009     Objective: Vital Signs: BP 129/77 (BP Location: Left Arm, Patient Position: Sitting, Cuff Size: Normal)   Pulse 73   Resp 14   Ht 5' 4.5" (1.638 m)   Wt 176 lb 6.4 oz (80 kg)   BMI 29.81 kg/m    Physical Exam   Musculoskeletal Exam: Spine was in good range of motion.  She had painful limited range of motion of the lumbar spine.  She had difficulty walking due to left-sided radiculopathy.  Shoulders, elbows, wrist joints were in good range of motion.  She had tenderness on palpation of her wrist joints and MCP joints but no synovitis was noted today.  PIP and DIP thickening was noted.   Hip joints and knee joints were in good range of motion patient  CDAI Exam: CDAI Score: -- Patient Global: --; Provider Global: -- Swollen: --; Tender: -- Joint Exam 08/04/2023   No joint exam has been documented for this visit   There is currently no information documented on the homunculus. Go to the Rheumatology activity and complete the homunculus joint exam.  Investigation: No additional findings.  Imaging: XR Hand 2 View Left  Result Date: 07/06/2023 Juxta-articular osteopenia was noted.  CMC, MCP, PIP and DIP narrowing was noted.  No intercarpal or radiocarpal joint space narrowing was noted.  No erosive changes were noted. 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  XR Hand 2 View Right  Result Date: 07/06/2023 Juxta-articular osteopenia was noted.  CMC, MCP, PIP and DIP narrowing was noted.  No intercarpal or radiocarpal joint space narrowing was noted.  No erosive changes were noted. 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   Recent Labs: Lab Results  Component Value Date   WBC 13.7 (H) 08/03/2023   HGB 12.8 08/03/2023   PLT 265 08/03/2023   NA 137 08/03/2023   K 3.6 08/03/2023   CL 103 08/03/2023   CO2 29 08/03/2023   GLUCOSE 100 (H) 08/03/2023   BUN 30 (H) 08/03/2023   CREATININE 0.80 08/03/2023   BILITOT 0.4 08/03/2023   ALKPHOS 40 08/03/2023   AST 15 08/03/2023   ALT 9 08/03/2023   PROT 5.7 (L) 08/03/2023   ALBUMIN 3.6 08/03/2023   CALCIUM 9.1 08/03/2023   GFRAA 93 10/28/2020   QFTBGOLDPLUS NEGATIVE 07/06/2023   July 06, 2023 ANA 1: 80 NS, ENA (SCL 70, RNP, dsDNA, SSA, SSB) negative, C3-C4 normal, ESR 14, CK22, TB Gold negative, immunoglobulins normal, IFE faint IgG lambda monoclonal immunoglobulin detected   January 11, 2023 ANA 1: 40 NS, RF negative, anti-CCP negative, sed rate 25 April 12, 2023 Smith negative June 23, 2023 ribosomal P-,  centromere negative, chromatin negative, Jo 1 negative  Speciality Comments: No specialty comments available.  Procedures:  No procedures performed Allergies: Flomax [tamsulosin hcl], Latex, Tramadol, and Ciprofloxacin   Assessment / Plan:     Visit Diagnoses: Seronegative rheumatoid arthritis (HCC) - History of pain in multiple joints and swelling in hands.  Patient has been having recurrent pain and swelling in her hands since March  2024.  All serology is negative.  She has low titer positive ANA.  She had synovitis in multiple joints at the last visit.  I discussed the possibility of seronegative rheumatoid arthritis.  Patient also had sarcoidosis in the past without any flare.  I discussed the option of starting on hydroxychloroquine.  Indications side effects contraindications were discussed at length.  Handout was given and consent was taken.  Patient will get clearance from her ophthalmologist and cardiologist as well.  She is currently on prednisone 10 mg p.o. daily.  I advised her to taper prednisone by 2.5 mg every week.  Will start hydroxychloroquine 200 mg p.o. twice daily Monday to Friday.  She will have baseline eye examination and then annual eye examination.  She was advised to get labs in a month and 3 months and then every 5 months.  Patient was counseled on the purpose, proper use, and adverse effects of hydroxychloroquine including nausea/diarrhea, skin rash, headaches, and sun sensitivity.  Advised patient to wear sunscreen once starting hydroxychloroquine to reduce risk of rash associated with sun sensitivity.  Discussed importance of annual eye exams while on hydroxychloroquine to monitor to ocular toxicity and discussed importance of frequent laboratory monitoring.  Provided patient with eye exam form for baseline ophthalmologic exam.  Provided patient with educational materials on hydroxychloroquine and answered all questions.  Patient consented to hydroxychloroquine. Will upload  consent in the media tab.    Reviewed risk for QTC prolongation when used in combination with other QTc prolonging agents (including but not limited to antiarrhythmics, macrolide antibiotics, flouroquinolone antibiotics, haloperidol, quetiapine, olanzapine, risperidone, droperidol, ziprasidone, amitriptyline, citalopram, ondansetron, migraine triptans, and methadone).   Positive ANA (antinuclear antibody)-she has low titer positive ANA, ENA panel and complements are normal.  She has no clinical features of systemic lupus.  Current chronic use of systemic steroids - Recurrent use of prednisone since February 2024.  She is current on prednisone 10 mg p.o. daily.  She will taper prednisone by 2.5 mg every week.  High risk medication use-patient will be starting on Plaquenil 200 mg p.o. twice daily Monday to Friday after she gets clearance from the ophthalmologist and cardiologist.  Annual eye examination was emphasized.  She will have labs in a month and then every 3 months then every 5 months.  Information on immunization was placed in the AVS.  Myalgia -patient symptoms also started with increased severe muscle pain and fatigue in August 2024.  There is a possibility that she may have PMR.  Patient's symptoms improved on prednisone use.  Pain in both hands - Patient had cortisone injections to her MCP joints by Dr. Janee Morn.  ANA low titer positive.  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  Dry eye syndrome of both eyes-over-the-counter products were discussed..  Age-related osteoporosis without current pathological fracture - November 05, 2021 T-score -2.6, BMD 0.756 AP spine.  Patient was given a prescription for Fosamax by her PCP which she has not started yet.  MGUS (monoclonal gammopathy of unknown significance) - Abnormal IFE.  Patient was evaluated by hematology.  Other eczema - Rash on  buttocks and abdominal region.  She had punch biopsy by Dr. Swazil[place] which was consistent with eczema.  Other insomnia  Other fatigue  Chronic midline low back pain with left-sided sciatica  Malignant neoplasm of ovary, unspecified laterality (HCC)  Pacemaker  Symptomatic sinus bradycardia  Pulmonary hypertension (HCC)  Obstructive sleep  apnea  Salzmann's nodular dystrophy of both eyes  Orders: No orders of the defined types were placed in this encounter.  Meds ordered this encounter  Medications   hydroxychloroquine (PLAQUENIL) 200 MG tablet    Sig: Take 1 tablet 200 mg twice daily from Monday through Friday only. DO NOT TAKE ON SATURDAY AND SUNDAY    Dispense:  120 tablet    Refill:  0   predniSONE (DELTASONE) 5 MG tablet    Sig: Rx # 2: Take 2 tab by mouth once daily x 1 week then 1.5 tab once daily x 1 week, then 1 tab once daily x 1 week, then half tablet once daily x 7 days then stop.    Dispense:  35 tablet    Refill:  0   predniSONE (DELTASONE) 10 MG tablet    Sig: Take 1 tablet (10 mg total) by mouth daily with breakfast. Rx # 1    Dispense:  14 tablet    Refill:  0     Follow-Up Instructions: Return in about 2 months (around 10/04/2023) for Rheumatoid arthritis.   Pollyann Savoy, MD  Note - This record has been created using Animal nutritionist.  Chart creation errors have been sought, but may not always  have been located. Such creation errors do not reflect on  the standard of medical care.

## 2023-07-21 NOTE — Telephone Encounter (Signed)
Reached out to patient to advised she has an abnormal IFE. Patient advised we are placing a referral for hematology. Patient is agreeable and referral placed. Patient states she states she was advised to take a multivitamin with iron. Patient states she took it for about 1 week. Patient states she stopped taking it because it was making her nauseous. Patient states she has had neck and shoulder discomfort which has been improving. Patient states her hand pain and fatigue have gotten worse. Patient is okay waiting until her follow up appointment on 08/04/2023 to discuss.

## 2023-07-28 ENCOUNTER — Encounter: Payer: Self-pay | Admitting: Rheumatology

## 2023-07-28 DIAGNOSIS — M545 Low back pain, unspecified: Secondary | ICD-10-CM | POA: Diagnosis not present

## 2023-08-03 ENCOUNTER — Encounter: Payer: Self-pay | Admitting: Oncology

## 2023-08-03 ENCOUNTER — Inpatient Hospital Stay: Payer: PPO | Attending: Oncology | Admitting: Oncology

## 2023-08-03 ENCOUNTER — Inpatient Hospital Stay: Payer: PPO

## 2023-08-03 VITALS — BP 162/71 | HR 89 | Temp 98.2°F | Resp 18 | Ht 64.5 in | Wt 179.0 lb

## 2023-08-03 DIAGNOSIS — Z803 Family history of malignant neoplasm of breast: Secondary | ICD-10-CM | POA: Insufficient documentation

## 2023-08-03 DIAGNOSIS — D472 Monoclonal gammopathy: Secondary | ICD-10-CM

## 2023-08-03 DIAGNOSIS — Z8042 Family history of malignant neoplasm of prostate: Secondary | ICD-10-CM | POA: Insufficient documentation

## 2023-08-03 DIAGNOSIS — M150 Primary generalized (osteo)arthritis: Secondary | ICD-10-CM | POA: Diagnosis not present

## 2023-08-03 DIAGNOSIS — Z90710 Acquired absence of both cervix and uterus: Secondary | ICD-10-CM | POA: Insufficient documentation

## 2023-08-03 DIAGNOSIS — Z7952 Long term (current) use of systemic steroids: Secondary | ICD-10-CM | POA: Insufficient documentation

## 2023-08-03 DIAGNOSIS — Z90722 Acquired absence of ovaries, bilateral: Secondary | ICD-10-CM | POA: Insufficient documentation

## 2023-08-03 DIAGNOSIS — Z800 Family history of malignant neoplasm of digestive organs: Secondary | ICD-10-CM | POA: Insufficient documentation

## 2023-08-03 DIAGNOSIS — Z9221 Personal history of antineoplastic chemotherapy: Secondary | ICD-10-CM | POA: Diagnosis not present

## 2023-08-03 DIAGNOSIS — M159 Polyosteoarthritis, unspecified: Secondary | ICD-10-CM | POA: Diagnosis not present

## 2023-08-03 DIAGNOSIS — Z8543 Personal history of malignant neoplasm of ovary: Secondary | ICD-10-CM | POA: Diagnosis not present

## 2023-08-03 DIAGNOSIS — Z87891 Personal history of nicotine dependence: Secondary | ICD-10-CM | POA: Diagnosis not present

## 2023-08-03 DIAGNOSIS — R200 Anesthesia of skin: Secondary | ICD-10-CM | POA: Diagnosis not present

## 2023-08-03 LAB — CBC WITH DIFFERENTIAL/PLATELET
Abs Immature Granulocytes: 0.08 10*3/uL — ABNORMAL HIGH (ref 0.00–0.07)
Basophils Absolute: 0 10*3/uL (ref 0.0–0.1)
Basophils Relative: 0 %
Eosinophils Absolute: 0.1 10*3/uL (ref 0.0–0.5)
Eosinophils Relative: 1 %
HCT: 41.1 % (ref 36.0–46.0)
Hemoglobin: 12.8 g/dL (ref 12.0–15.0)
Immature Granulocytes: 1 %
Lymphocytes Relative: 8 %
Lymphs Abs: 1.1 10*3/uL (ref 0.7–4.0)
MCH: 27.9 pg (ref 26.0–34.0)
MCHC: 31.1 g/dL (ref 30.0–36.0)
MCV: 89.5 fL (ref 80.0–100.0)
Monocytes Absolute: 0.8 10*3/uL (ref 0.1–1.0)
Monocytes Relative: 6 %
Neutro Abs: 11.6 10*3/uL — ABNORMAL HIGH (ref 1.7–7.7)
Neutrophils Relative %: 84 %
Platelets: 265 10*3/uL (ref 150–400)
RBC: 4.59 MIL/uL (ref 3.87–5.11)
RDW: 16.4 % — ABNORMAL HIGH (ref 11.5–15.5)
WBC: 13.7 10*3/uL — ABNORMAL HIGH (ref 4.0–10.5)
nRBC: 0 % (ref 0.0–0.2)

## 2023-08-03 LAB — COMPREHENSIVE METABOLIC PANEL
ALT: 9 U/L (ref 0–44)
AST: 15 U/L (ref 15–41)
Albumin: 3.6 g/dL (ref 3.5–5.0)
Alkaline Phosphatase: 40 U/L (ref 38–126)
Anion gap: 5 (ref 5–15)
BUN: 30 mg/dL — ABNORMAL HIGH (ref 8–23)
CO2: 29 mmol/L (ref 22–32)
Calcium: 9.1 mg/dL (ref 8.9–10.3)
Chloride: 103 mmol/L (ref 98–111)
Creatinine, Ser: 0.8 mg/dL (ref 0.44–1.00)
GFR, Estimated: >60 mL/min (ref >60)
Glucose, Bld: 100 mg/dL — ABNORMAL HIGH (ref 70–99)
Potassium: 3.6 mmol/L (ref 3.5–5.1)
Sodium: 137 mmol/L (ref 135–145)
Total Bilirubin: 0.4 mg/dL (ref 0.3–1.2)
Total Protein: 5.7 g/dL — ABNORMAL LOW (ref 6.5–8.1)

## 2023-08-03 LAB — LACTATE DEHYDROGENASE: LDH: 174 U/L (ref 98–192)

## 2023-08-03 NOTE — Assessment & Plan Note (Addendum)
-   Stage 1 disease, diagnosed in early 2011. S/p optimal surgical debulking (TAH/BSO, omentectomy, pelvic and periaortic LND) in March 2011 and 6 cycles of carbo/taxol in July 2011. She was undergoing surveillance since that time with no evidence of recurrence.  - Last seen by her oncologist at outside facility in January 2021.  CA-125 was 11, normal.  She was discharged from their office. -Clinically no signs or symptoms to suggest disease recurrence.  Routine imaging is not indicated as per NCCN guidelines.

## 2023-08-03 NOTE — Assessment & Plan Note (Signed)
Recent exacerbation with pain and numbness in the leg, improved with prednisone taper. -Continue current management under orthopedist. -Follow-up with orthopedist as scheduled.

## 2023-08-03 NOTE — Assessment & Plan Note (Addendum)
Faint IgG lambda detected on serum immunoelectrophoresis on routine blood work done at her rheumatologist office.  -Discussed diagnosis, implications, plan of care. 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   -Discussed small risk of progression to smoldering myeloma or actual multiple myeloma and hence continued monitoring is recommended.   -To complete workup, we will obtain serum free light chains, repeat SPEP.  Will also obtain 24-hour urine protein electrophoresis and immunoelectrophoresis.   -Schedule phone call visit in 3 weeks to discuss results. -Plan for follow-up visit in 3 months with labs to be done 10 days prior.

## 2023-08-03 NOTE — Progress Notes (Signed)
[facility]  HEMATOLOGY CLINIC CONSULTATION NOTE    PATIENT NAME: Julia Solis   MR#: [id number] DOB: Mar 10, 1951  DATE OF SERVICE: 08/03/2023   REFERRING PHYSICIAN  Pollyann Savoy, MD, Rheumatology.   Patient Care Team: Margaree Mackintosh, MD as PCP - General (Internal Medicine) Mealor, Roberts Gaudy, MD as Consulting Physician (Cardiology) Pollyann Savoy, MD as Consulting Physician (Rheumatology)   REASON FOR CONSULTATION/ CHIEF COMPLAINT:  Evaluation for monoclonal gammopathy  HISTORY OF PRESENT ILLNESS:  Julia Solis is a 72 y.o. lady with a past medical history of sarcoidosis, pulmonary hypertension, sick sinus syndrome, hypertension, osteoporosis, renal stones, history of stage I ovarian cancer diagnosed in 2011, in remission, degenerative joint disease, was referred to our service for evaluation of monoclonal gammopathy.    Discussed the use of AI scribe software for clinical note transcription with the patient, who gave verbal consent to proceed.   She was recently referred to a rheumatologist by her PCP.  Her rheumatologist pursued workup for suspected polymyalgia rheumatica (PMR) or rheumatoid arthritis (RA).  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  Immunoelectrophoresis showed faint IgG lambda monoclonal protein.  Given this finding, she was referred to [place] for further evaluation.    The patient has been experiencing back pain and leg numbness, which she attributes to a flare of her degenerative disc disease. The back pain has improved but the leg numbness persists. The patient has been on prednisone intermittently and the dosage was recently tapered by an orthopedist.  She denies fever, cough, diarrhea, or other infectious symptoms.  She denies epistaxis, bloody stool, melena, hematuria, bruising or other bleeding symptoms. She also denies unintentional weight loss, night sweats or other constitutional  symptoms.  MEDICAL HISTORY Past Medical History:  Diagnosis Date   Cancer [facility]) 2011   ovarian   Complication of anesthesia    GERD (gastroesophageal reflux disease)    Hearing aid worn    both ears   History of chemotherapy 2011   History of hiatal hernia    History of kidney stones    Hypertension    Neuromuscular disorder (HCC)    left foot numbness since  eback surgery   Osteoporosis    PONV (postoperative nausea and vomiting)    Presence of permanent cardiac pacemaker    Sarcoidosis    in remission sees pumonary  once a year dr bellinger lov 08-21-2020 care everywhere   Sick sinus syndrome [facility])    Wears glasses      SURGICAL HISTORY Past Surgical History:  Procedure Laterality Date   ABDOMINAL HYSTERECTOMY  2011   APPENDECTOMY  2011    ?with hysterectomy   BACK SURGERY  1997   lumbar   CARPAL TUNNEL RELEASE Right    CESAREAN SECTION  1982, 1985   COSMETIC SURGERY  2007   brow lift   CYSTOSCOPY WITH RETROGRADE PYELOGRAM, URETEROSCOPY AND STENT PLACEMENT Left 02/04/2021   Procedure: CYSTOSCOPY WITH RETROGRADE PYELOGRAM, URETEROSCOPY AND STENT PLACEMENT;  Surgeon: Belva Agee, MD;  Location: [facility];  Service: Urology;  Laterality: Left;  1 HR; nephrolithiasis   ECTOPIC PREGNANCY SURGERY  1980   EXTRACORPOREAL SHOCK WAVE LITHOTRIPSY Left 06/23/2018   Procedure: LEFT EXTRACORPOREAL SHOCK WAVE LITHOTRIPSY (ESWL);  Surgeon: Malen Gauze, MD;  Location: [facility];  Service: Urology;  Laterality: Left;   GANGLION CYST EXCISION  1975   HOLMIUM LASER APPLICATION Left 02/04/2021   Procedure: HOLMIUM LASER APPLICATION;  Surgeon: Belva Agee, MD;  Location: [facility];  Service: Urology;  Laterality: Left;   PACEMAKER IMPLANT N/A 11/13/2020   Procedure: PACEMAKER IMPLANT;  Surgeon: Hillis Range, MD;  Location: [facility];  Service: Cardiovascular;  Laterality: N/A;   TONSILLECTOMY  1957   adenoids removed      SOCIAL HISTORY: She reports that she quit smoking about 46 years ago. Her smoking use included cigarettes. She started smoking about 57 years ago. She has a 16.5 pack-year smoking history. She has been exposed to tobacco smoke. She has never used smokeless tobacco. She reports current alcohol use. She reports that she does not use drugs. Social History   Socioeconomic History   Marital status: Divorced    Spouse name: Not on file   Number of children: 2   Years of education: Not on file   Highest education level: Bachelor's degree (e.g., BA, AB, BS)  Occupational History   Occupation: [employer]    Employer: [employer]  Tobacco Use   Smoking status: Former    Current packs/day: 0.00    Average packs/day: 1.5 packs/day for 11.0 years (16.5 ttl pk-yrs)    Types: Cigarettes    Start date: 10/05/1965    Quit date: 10/05/1976    Years since quitting: 46.8    Passive exposure: Past   Smokeless tobacco: Never  Vaping Use   Vaping status: Never Used  Substance and Sexual Activity   Alcohol use: Yes    Comment: occ   Drug use: No   Sexual activity: Never  Other Topics Concern   Not on file  Social History Narrative   Not on file   Social Determinants of Health   Financial Resource Strain: Low Risk  (04/11/2023)   Overall Financial Resource Strain (CARDIA)    Difficulty of Paying Living Expenses: Not hard at all  Food Insecurity: No Food Insecurity (08/03/2023)   Hunger Vital Sign    Worried About Running Out of Food in the Last Year: Never true    Ran Out of Food in the Last Year: Never true  Transportation Needs: No Transportation Needs (08/03/2023)   PRAPARE - Administrator, Civil Service (Medical): No    Lack of Transportation (Non-Medical): No  Physical Activity: Unknown (04/11/2023)   Exercise Vital Sign    Days of Exercise per Week: 0 days    Minutes of Exercise per Session: Not on file  Stress: No Stress Concern Present (04/11/2023)   [employer]  of Occupational Health - Occupational Stress Questionnaire    Feeling of Stress : Not at all  Social Connections: Socially Isolated (04/11/2023)   Social Connection and Isolation Panel [NHANES]    Frequency of Communication with Friends and Family: More than three times a week    Frequency of Social Gatherings with Friends and Family: More than three times a week    Attends Religious Services: Never    Database administrator or Organizations: No    Attends Engineer, structural: Not on file    Marital Status: Divorced  Intimate Partner Violence: Not At Risk (08/03/2023)   Humiliation, Afraid, Rape, and Kick questionnaire    Fear of Current or Ex-Partner: No    Emotionally Abused: No    Physically Abused: No    Sexually Abused: No    FAMILY HISTORY: Her family history includes Breast cancer in her cousin, cousin and another family member; Breast cancer (age of onset: 57) in  her cousin; Breast cancer (age of onset: 37) in her paternal aunt; Breast cancer (age of onset: 5) in her cousin; Cancer in her father and other family members; Colon cancer in her maternal grandmother, paternal aunt, and paternal grandmother; Colon polyps in her cousin and sister; Heart disease in her maternal grandfather and paternal grandfather; Hypertension in her father and mother; Melanoma (age of onset: 48) in her cousin; Prostate cancer (age of onset: 62) in her father; Stroke in her father.  CURRENT MEDICATIONS   Current Outpatient Medications  Medication Instructions   cyclobenzaprine (FLEXERIL) 5-10 mg, Oral, At bedtime PRN   famotidine (PEPCID) 20 MG tablet 1 tablet at bedtime as needed Orally Once a day   famotidine (PEPCID) 20 mg, Daily PRN   fluticasone (FLONASE) 50 MCG/ACT nasal spray Nasal   furosemide (LASIX) 20 mg, Oral, Daily   loratadine (CLARITIN) 10 mg, Oral, Daily   LORazepam (ATIVAN) 1 MG tablet TAKE 1 TABLET BY MOUTH EVERY DAY AT BEDTIME AS NEEDED   losartan (COZAAR) 25 mg, Oral,  Daily   meloxicam (MOBIC) 15 mg, Oral, As needed, PRN   Multiple Vitamin (MULTIVITAMIN WITH MINERALS) TABS tablet 1 tablet, Oral, Daily   oxyCODONE (OXY IR/ROXICODONE) 5 mg, Oral, Daily PRN   Polyethyl Glycol-Propyl Glycol (SYSTANE) 0.4-0.3 % GEL ophthalmic gel 1 application , Both Eyes, 2 times daily   predniSONE (DELTASONE) 10 MG tablet One or 2 tabs daily with food for sarcoidosis   rosuvastatin (CRESTOR) 5 MG tablet TAKE 1 TABLET BY MOUTH 3 TIMES A WEEK WITH SUPPER   Vitamin D 4,000 Units, Oral, Daily     ALLERGIES  She is allergic to flomax [tamsulosin hcl], latex, tramadol, and ciprofloxacin.  REVIEW OF SYSTEMS:  Review of Systems - Oncology   Rest of the pertinent review of systems is unremarkable except as mentioned above in HPI.  PHYSICAL EXAMINATION:  ECOG PERFORMANCE STATUS: 1 - Symptomatic but completely ambulatory  Vitals:   08/03/23 1004 08/03/23 1005  BP: (!) 163/71 (!) 162/71  Pulse: 89   Resp: 18   Temp: 98.2 F (36.8 C)   SpO2: 100%    Filed Weights   08/03/23 1004  Weight: 179 lb (81.2 kg)    Physical Exam Constitutional:      General: She is not in acute distress.    Appearance: Normal appearance. She is well-developed.  HENT:     Head: Normocephalic and atraumatic.  Eyes:     Extraocular Movements: Extraocular movements intact.  Cardiovascular:     Rate and Rhythm: Normal rate.  Pulmonary:     Effort: No respiratory distress.  Abdominal:     General: There is no distension.  Neurological:     General: No focal deficit present.     Mental Status: She is alert and oriented to person, place, and time.  Psychiatric:        Mood and Affect: Mood normal.        Behavior: Behavior normal.        Thought Content: Thought content normal.     LABORATORY DATA:   I have reviewed the data as listed.  Results for orders placed or performed in visit on 08/03/23  Lactate dehydrogenase  Result Value Ref Range   LDH 174 98 - 192 U/L   Comprehensive metabolic panel  Result Value Ref Range   Sodium 137 135 - 145 mmol/L   Potassium 3.6 3.5 - 5.1 mmol/L   Chloride 103 98 - 111 mmol/L  CO2 29 22 - 32 mmol/L   Glucose, Bld 100 (H) 70 - 99 mg/dL   BUN 30 (H) 8 - 23 mg/dL   Creatinine, Ser 1.61 0.44 - 1.00 mg/dL   Calcium 9.1 8.9 - 09.6 mg/dL   Total Protein 5.7 (L) 6.5 - 8.1 g/dL   Albumin 3.6 3.5 - 5.0 g/dL   AST 15 15 - 41 U/L   ALT 9 0 - 44 U/L   Alkaline Phosphatase 40 38 - 126 U/L   Total Bilirubin 0.4 0.3 - 1.2 mg/dL   GFR, Estimated >04 >54 mL/min   Anion gap 5 5 - 15  CBC with Differential/Platelet  Result Value Ref Range   WBC 13.7 (H) 4.0 - 10.5 K/uL   RBC 4.59 3.87 - 5.11 MIL/uL   Hemoglobin 12.8 12.0 - 15.0 g/dL   HCT 09.8 11.9 - 14.7 %   MCV 89.5 80.0 - 100.0 fL   MCH 27.9 26.0 - 34.0 pg   MCHC 31.1 30.0 - 36.0 g/dL   RDW 82.9 (H) 56.2 - 13.0 %   Platelets 265 150 - 400 K/uL   nRBC 0.0 0.0 - 0.2 %   Neutrophils Relative % 84 %   Neutro Abs 11.6 (H) 1.7 - 7.7 K/uL   Lymphocytes Relative 8 %   Lymphs Abs 1.1 0.7 - 4.0 K/uL   Monocytes Relative 6 %   Monocytes Absolute 0.8 0.1 - 1.0 K/uL   Eosinophils Relative 1 %   Eosinophils Absolute 0.1 0.0 - 0.5 K/uL   Basophils Relative 0 %   Basophils Absolute 0.0 0.0 - 0.1 K/uL   Immature Granulocytes 1 %   Abs Immature Granulocytes 0.08 (H) 0.00 - 0.07 K/uL    Lab Results  Component Value Date   WBC 13.7 (H) 08/03/2023   NEUTROABS 11.6 (H) 08/03/2023   HGB 12.8 08/03/2023   HCT 41.1 08/03/2023   MCV 89.5 08/03/2023   PLT 265 08/03/2023     Recent Labs    01/11/23 1153 06/03/23 1803 07/06/23 1033 08/03/23 1117  NA 141 138 141 137  K 4.3 4.1 3.7 3.6  CL 106 103 106 103  CO2 28 25 25 29   GLUCOSE 98 100* 84 100*  BUN 17 18 23  30*  CREATININE 0.78 0.67 0.76 0.80  CALCIUM 8.6 9.1 8.9 9.1  GFRNONAA  --  >60  --  >60  PROT 5.3* 6.7 6.0* 5.7*  ALBUMIN  --  3.6  --  3.6  AST 14 13*  --  15  ALT 7 5  --  9  ALKPHOS  --  70  --  40   BILITOT 0.3 0.4  --  0.4     RADIOGRAPHIC STUDIES:  I have personally reviewed the radiological images as listed and agreed with the findings in the report.  XR Hand 2 View Left  Result Date: 07/06/2023 Juxta-articular osteopenia was noted.  CMC, MCP, PIP and DIP narrowing was noted.  No intercarpal or radiocarpal joint space narrowing was noted.  No erosive changes were noted. 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  XR Hand 2 View Right  Result Date: 07/06/2023 Juxta-articular osteopenia was noted.  CMC, MCP, PIP and DIP narrowing was noted.  No intercarpal or radiocarpal joint space narrowing was noted.  No erosive changes were noted. 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   ASSESSMENT & PLAN:  72 y.o. lady with a past medical history of  sarcoidosis, pulmonary hypertension, sick sinus syndrome, hypertension, osteoporosis, renal stones, history of stage I ovarian cancer diagnosed in 2011, in remission, degenerative joint disease, was referred to our service for evaluation of monoclonal gammopathy.  Monoclonal gammopathy of unknown significance Faint IgG lambda detected on serum immunoelectrophoresis on routine blood work done at her rheumatologist office.  -Discussed diagnosis, implications, plan of care. 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   -Discussed small risk of progression to smoldering myeloma or actual multiple myeloma and hence continued monitoring is recommended.   -To complete workup, we will obtain serum free light chains, repeat SPEP.  Will also obtain 24-hour urine protein electrophoresis and immunoelectrophoresis.   -Schedule phone call visit in 3 weeks to discuss results. -Plan for follow-up visit in 3  months with labs to be done 10 days prior.  Primary osteoarthritis involving multiple joints Recent exacerbation with pain and numbness in the leg, improved with prednisone taper. -Continue current management under orthopedist. -Follow-up with orthopedist as scheduled.  History of ovarian cancer - Stage 1 disease, diagnosed in early 2011. S/p optimal surgical debulking (TAH/BSO, omentectomy, pelvic and periaortic LND) in March 2011 and 6 cycles of carbo/taxol in July 2011. She was undergoing surveillance since that time with no evidence of recurrence.  - Last seen by her oncologist at outside facility in January 2021.  CA-125 was 11, normal.  She was discharged from their office. -Clinically no signs or symptoms to suggest disease recurrence.  Routine imaging is not indicated as per NCCN guidelines.  Rheumatologic Condition (PMR or RA) Under evaluation by rheumatologist. Currently on prednisone. -Continue current management under rheumatologist. -Follow-up with rheumatologist as scheduled.   Orders Placed This Encounter  Procedures   CBC with Differential/Platelet    Standing Status:   Future    Number of Occurrences:   1    Standing Expiration Date:   08/02/2024   Comprehensive metabolic panel    Standing Status:   Future    Number of Occurrences:   1    Standing Expiration Date:   08/02/2024   Lactate dehydrogenase    Standing Status:   Future    Number of Occurrences:   1    Standing Expiration Date:   08/02/2024   Kappa/lambda light chains    Standing Status:   Future    Number of Occurrences:   1    Standing Expiration Date:   08/02/2024   Multiple Myeloma Panel (SPEP&IFE w/QIG)    Standing Status:   Future    Number of Occurrences:   1    Standing Expiration Date:   08/02/2024   24 Hr Ur Kappa/Lambda/Light Chains, Free w Ratio    Standing Status:   Future    Standing Expiration Date:   08/02/2024    The total time spent in the appointment was 55 minutes encounter  with the patient, including review of chart and results of various tests, discussion about plan of care and coordination of care plan.  I reviewed lab results and outside records for this visit and discussed relevant results with the patient. Diagnosis, plan of care and treatment options were also discussed in detail with the patient. Opportunity provided to ask questions and answers provided to her apparent satisfaction. Provided instructions to call our clinic with any problems, questions or concerns prior to return visit. I recommended to continue follow-up with  PCP and sub-specialists. She verbalized understanding and agreed with the plan. No barriers to learning was detected.   Future Appointments  Date Time Provider Department Center  08/04/2023  9:40 AM Pollyann Savoy, MD [facility] None  08/12/2023  7:00 AM [facility] DEVICE REMOTES [facility]  08/20/2023  9:10 AM [facility]  08/23/2023 11:00 AM Baxley, Luanna Cole, MD [facility]  08/23/2023  3:00 PM Corie Vavra, Archie Patten, MD [facility] None  10/22/2023  2:00 PM [facility] PHLEBOTOMIST [facility] None  11/01/2023  3:00 PM Treyten Monestime, MD [facility] None  11/11/2023  7:00 AM [facility] DEVICE REMOTES [facility]  12/14/2023  1:10 PM [facility] [address] [facility] None  02/10/2024  7:00 AM [facility] DEVICE REMOTES [facility]  05/11/2024  7:00 AM [facility] DEVICE REMOTES [facility]  08/10/2024  7:00 AM [facility] DEVICE REMOTES [facility]     Satoria Dunlop, MD  08/03/2023 1:40 PM  [facility] [address] [place] [address] Dept: [phone] Dept Fax: [phone]    This document was completed utilizing speech recognition software. Grammatical errors, random word insertions, pronoun errors, and incomplete sentences are an occasional consequence of this system due to software limitations, ambient noise, and hardware issues.  Any formal questions or concerns about the content, text or information contained within the body of this dictation should be directly addressed to the provider for clarification.

## 2023-08-04 ENCOUNTER — Encounter: Payer: Self-pay | Admitting: Cardiovascular Disease

## 2023-08-04 ENCOUNTER — Ambulatory Visit: Payer: PPO | Attending: Rheumatology | Admitting: Rheumatology

## 2023-08-04 ENCOUNTER — Encounter: Payer: Self-pay | Admitting: Rheumatology

## 2023-08-04 VITALS — BP 129/77 | HR 73 | Resp 14 | Ht 64.5 in | Wt 176.4 lb

## 2023-08-04 DIAGNOSIS — H04123 Dry eye syndrome of bilateral lacrimal glands: Secondary | ICD-10-CM | POA: Diagnosis not present

## 2023-08-04 DIAGNOSIS — M2550 Pain in unspecified joint: Secondary | ICD-10-CM

## 2023-08-04 DIAGNOSIS — Z79899 Other long term (current) drug therapy: Secondary | ICD-10-CM | POA: Diagnosis not present

## 2023-08-04 DIAGNOSIS — G4709 Other insomnia: Secondary | ICD-10-CM | POA: Diagnosis not present

## 2023-08-04 DIAGNOSIS — R001 Bradycardia, unspecified: Secondary | ICD-10-CM

## 2023-08-04 DIAGNOSIS — D869 Sarcoidosis, unspecified: Secondary | ICD-10-CM

## 2023-08-04 DIAGNOSIS — R778 Other specified abnormalities of plasma proteins: Secondary | ICD-10-CM

## 2023-08-04 DIAGNOSIS — M810 Age-related osteoporosis without current pathological fracture: Secondary | ICD-10-CM

## 2023-08-04 DIAGNOSIS — M791 Myalgia, unspecified site: Secondary | ICD-10-CM

## 2023-08-04 DIAGNOSIS — R5383 Other fatigue: Secondary | ICD-10-CM

## 2023-08-04 DIAGNOSIS — R768 Other specified abnormal immunological findings in serum: Secondary | ICD-10-CM | POA: Diagnosis not present

## 2023-08-04 DIAGNOSIS — Z7952 Long term (current) use of systemic steroids: Secondary | ICD-10-CM

## 2023-08-04 DIAGNOSIS — M5442 Lumbago with sciatica, left side: Secondary | ICD-10-CM

## 2023-08-04 DIAGNOSIS — M79641 Pain in right hand: Secondary | ICD-10-CM | POA: Diagnosis not present

## 2023-08-04 DIAGNOSIS — Z950 Presence of cardiac pacemaker: Secondary | ICD-10-CM

## 2023-08-04 DIAGNOSIS — M79642 Pain in left hand: Secondary | ICD-10-CM

## 2023-08-04 DIAGNOSIS — D472 Monoclonal gammopathy: Secondary | ICD-10-CM

## 2023-08-04 DIAGNOSIS — L308 Other specified dermatitis: Secondary | ICD-10-CM

## 2023-08-04 DIAGNOSIS — C569 Malignant neoplasm of unspecified ovary: Secondary | ICD-10-CM

## 2023-08-04 DIAGNOSIS — H18453 Nodular corneal degeneration, bilateral: Secondary | ICD-10-CM

## 2023-08-04 DIAGNOSIS — M0600 Rheumatoid arthritis without rheumatoid factor, unspecified site: Secondary | ICD-10-CM | POA: Diagnosis not present

## 2023-08-04 DIAGNOSIS — G8929 Other chronic pain: Secondary | ICD-10-CM

## 2023-08-04 DIAGNOSIS — G4733 Obstructive sleep apnea (adult) (pediatric): Secondary | ICD-10-CM

## 2023-08-04 DIAGNOSIS — I272 Pulmonary hypertension, unspecified: Secondary | ICD-10-CM

## 2023-08-04 LAB — KAPPA/LAMBDA LIGHT CHAINS
Kappa free light chain: 22 mg/L — ABNORMAL HIGH (ref 3.3–19.4)
Kappa, lambda light chain ratio: 0.83 (ref 0.26–1.65)
Lambda free light chains: 26.5 mg/L — ABNORMAL HIGH (ref 5.7–26.3)

## 2023-08-04 MED ORDER — HYDROXYCHLOROQUINE SULFATE 200 MG PO TABS: ORAL_TABLET | ORAL | 0 refills | Status: DC

## 2023-08-04 MED ORDER — PREDNISONE 5 MG PO TABS: ORAL_TABLET | ORAL | 0 refills | Status: DC

## 2023-08-04 MED ORDER — PREDNISONE 10 MG PO TABS: 10.0000 mg | ORAL_TABLET | Freq: Every day | ORAL | 0 refills | Status: DC

## 2023-08-04 NOTE — Progress Notes (Signed)
Pharmacy Note  Subjective: Patient presents today to [facility] for follow up office visit.   Patient seen by the pharmacist for counseling on hydroxychloroquine for seronegative rheumatoid arthritis.  Prior therapy includes: prednisone (currently taking 10mg  daily)  Objective: CMP     Component Value Date/Time   NA 137 08/03/2023 1117   NA 144 10/28/2020 1636   K 3.6 08/03/2023 1117   CL 103 08/03/2023 1117   CO2 29 08/03/2023 1117   GLUCOSE 100 (H) 08/03/2023 1117   BUN 30 (H) 08/03/2023 1117   BUN 16 10/28/2020 1636   CREATININE 0.80 08/03/2023 1117   CREATININE 0.76 07/06/2023 1033   CALCIUM 9.1 08/03/2023 1117   PROT 5.7 (L) 08/03/2023 1117   ALBUMIN 3.6 08/03/2023 1117   AST 15 08/03/2023 1117   ALT 9 08/03/2023 1117   ALKPHOS 40 08/03/2023 1117   BILITOT 0.4 08/03/2023 1117   GFRNONAA >60 08/03/2023 1117   GFRNONAA 82 07/11/2020 0949   GFRAA 93 10/28/2020 1636   GFRAA 95 07/11/2020 0949    CBC    Component Value Date/Time   WBC 13.7 (H) 08/03/2023 1117   RBC 4.59 08/03/2023 1117   HGB 12.8 08/03/2023 1117   HGB 13.7 10/28/2020 1636   HGB 13.0 10/02/2011 1026   HCT 41.1 08/03/2023 1117   HCT 41.7 10/28/2020 1636   HCT 37.8 10/02/2011 1026   PLT 265 08/03/2023 1117   PLT 251 10/28/2020 1636   MCV 89.5 08/03/2023 1117   MCV 87 10/28/2020 1636   MCV 89.0 10/02/2011 1026   MCH 27.9 08/03/2023 1117   MCHC 31.1 08/03/2023 1117   RDW 16.4 (H) 08/03/2023 1117   RDW 13.1 10/28/2020 1636   RDW 13.6 10/02/2011 1026   LYMPHSABS 1.1 08/03/2023 1117   LYMPHSABS 3.2 (H) 10/28/2020 1636   LYMPHSABS 1.8 10/02/2011 1026   MONOABS 0.8 08/03/2023 1117   MONOABS 0.4 10/02/2011 1026   EOSABS 0.1 08/03/2023 1117   EOSABS 0.3 10/28/2020 1636   BASOSABS 0.0 08/03/2023 1117   BASOSABS 0.1 10/28/2020 1636   BASOSABS 0.0 10/02/2011 1026    Assessment/Plan: Patient was counseled on the purpose, proper use, and adverse effects of hydroxychloroquine including  nausea/diarrhea, skin rash, headaches, and sun sensitivity.  Advised patient to wear sunscreen once starting hydroxychloroquine to reduce risk of rash associated with sun sensitivity.  Discussed importance of annual eye exams while on hydroxychloroquine to monitor to ocular toxicity and discussed importance of frequent laboratory monitoring.  Provided patient with eye exam form for baseline ophthalmologic exam.  Provided patient with educational materials on hydroxychloroquine and answered all questions.  Patient consented to hydroxychloroquine. Will upload consent in the media tab.    Reviewed risk for QTC prolongation when used in combination with other QTc prolonging agents (including but not limited to antiarrhythmics, macrolide antibiotics, flouroquinolone antibiotics, haloperidol, quetiapine, olanzapine, risperidone, droperidol, ziprasidone, amitriptyline, citalopram, ondansetron, migraine triptans, and methadone).   Dose will be Plaquenil 200 mg twice daily Monday through Friday.  Patient will wait to start after cataract surgery.  Decrease prednisone by 2.5mg  weekly after starting hydroxychloroquine Week 1: 10mg  daily Week 2: 7.5mg  daily Week 3: 5 mg daily Week 4: 2.5mg  daily then stop  Chesley Mires, PharmD, MPH, BCPS, CPP Clinical Pharmacist (Rheumatology and Pulmonology)

## 2023-08-04 NOTE — Patient Instructions (Addendum)
Start HYDROXYCHLOROQUINE 1 tablet twice daily MONDAY THROUGH FRIDAY ONLY after your cataract surgery  After starting hydroxyhloroquine, reduce prednisone as follows: Week 1: 10mg  daily Week 2: 7.5mg  daily Week 3: 5 mg daily Week 4: 2.5mg  daily then stop  Labs in 1 month, 3 months, then every 5 months  Hydroxychloroquine Tablets What is this medication? HYDROXYCHLOROQUINE (hye drox ee KLOR oh kwin) treats autoimmune conditions, such as rheumatoid arthritis and lupus. It works by slowing down an overactive immune system. It may also be used to prevent and treat malaria. It works by killing the parasite that causes malaria. It belongs to a group of medications called DMARDs. This medicine may be used for other purposes; ask your health care provider or pharmacist if you have questions. COMMON BRAND NAME(S): Plaquenil, Quineprox, SOVUNA What should I tell my care team before I take this medication? They need to know if you have any of these conditions: Diabetes Eye disease, vision problems Frequently drink alcohol G6PD deficiency Heart disease Irregular heartbeat or rhythm Kidney disease Liver disease Porphyria Psoriasis An unusual or allergic reaction to hydroxychloroquine, other medications, foods, dyes, or preservatives Pregnant or trying to get pregnant Breastfeeding How should I use this medication? Take this medication by mouth with water. Take it as directed on the prescription label. Do not cut, crush, or chew this medication. Swallow the tablets whole. Take it with food. Do not take it more than directed. Take all of this medication unless your care team tells you to stop it early. Keep taking it even if you think you are better. Take products with antacids in them at a different time of day than this medication. Take this medication 4 hours before or 4 hours after antacids. Talk to your care team if you have questions. Talk to your care team about the use of this medication in  children. While this medication may be prescribed for selected conditions, precautions do apply. Overdosage: If you think you have taken too much of this medicine contact a poison control center or emergency room at once. NOTE: This medicine is only for you. Do not share this medicine with others. What if I miss a dose? If you miss a dose, take it as soon as you can. If it is almost time for your next dose, take only that dose. Do not take double or extra doses. What may interact with this medication? Do not take this medication with any of the following: Cisapride Dronedarone Pimozide Thioridazine This medication may also interact with the following: Ampicillin Antacids Cimetidine Cyclosporine Digoxin Kaolin Medications for diabetes, such as insulin, glipizide, glyburide Medications for seizures, such as carbamazepine, phenobarbital, phenytoin Mefloquine Methotrexate Other medications that cause heart rhythm changes Praziquantel This list may not describe all possible interactions. Give your health care provider a list of all the medicines, herbs, non-prescription drugs, or dietary supplements you use. Also tell them if you smoke, drink alcohol, or use illegal drugs. Some items may interact with your medicine. What should I watch for while using this medication? Visit your care team for regular checks on your progress. Tell your care team if your symptoms do not start to get better or if they get worse. You may need blood work done while you are taking this medication. If you take other medications that can affect heart rhythm, you may need more testing. Talk to your care team if you have questions. Your vision may be tested before and during use of this medication. Tell your care  team right away if you have any change in your eyesight. This medication may cause serious skin reactions. They can happen weeks to months after starting the medication. Contact your care team right away if  you notice fevers or flu-like symptoms with a rash. The rash may be red or purple and then turn into blisters or peeling of the skin. Or, you might notice a red rash with swelling of the face, lips or lymph nodes in your neck or under your arms. If you or your family notice any changes in your behavior, such as new or worsening depression, thoughts of harming yourself, anxiety, or other unusual or disturbing thoughts, or memory loss, call your care team right away. What side effects may I notice from receiving this medication? Side effects that you should report to your care team as soon as possible: Allergic reactions--skin rash, itching, hives, swelling of the face, lips, tongue, or throat Aplastic anemia--unusual weakness or fatigue, dizziness, headache, trouble breathing, increased bleeding or bruising Change in vision Heart rhythm changes--fast or irregular heartbeat, dizziness, feeling faint or lightheaded, chest pain, trouble breathing Infection--fever, chills, cough, or sore throat Low blood sugar (hypoglycemia)--tremors or shaking, anxiety, sweating, cold or clammy skin, confusion, dizziness, rapid heartbeat Muscle injury--unusual weakness or fatigue, muscle pain, dark yellow or brown urine, decrease in amount of urine Pain, tingling, or numbness in the hands or feet Rash, fever, and swollen lymph nodes Redness, blistering, peeling, or loosening of the skin, including inside the mouth Thoughts of suicide or self-harm, worsening mood, or feelings of depression Unusual bruising or bleeding Side effects that usually do not require medical attention (report to your care team if they continue or are bothersome): Diarrhea Headache Nausea Stomach pain Vomiting This list may not describe all possible side effects. Call your doctor for medical advice about side effects. You may report side effects to FDA at 1-800-FDA-1088. Where should I keep my medication? Keep out of the reach of children  and pets. Store at room temperature up to 30 degrees C (86 degrees F). Protect from light. Get rid of any unused medication after the expiration date. To get rid of medications that are no longer needed or have expired: Take the medication to a medication take-back program. Check with your pharmacy or law enforcement to find a location. If you cannot return the medication, check the label or package insert to see if the medication should be thrown out in the garbage or flushed down the toilet. If you are not sure, ask your care team. If it is safe to put it in the trash, empty the medication out of the container. Mix the medication with cat litter, dirt, coffee grounds, or other unwanted substance. Seal the mixture in a bag or container. Put it in the trash. NOTE: This sheet is a summary. It may not cover all possible information. If you have questions about this medicine, talk to your doctor, pharmacist, or health care provider.  2024 [employer] Standard (2022-03-30 00:00:00)  Standing Labs We placed an order today for your standing lab work.   Please have your standing labs drawn in 1 month after start Plaquenil then 3 months and then every 5 months  Please have your labs drawn 2 weeks prior to your appointment so that the provider can discuss your lab results at your appointment, if possible.  Please note that you may see your imaging and lab results in MyChart before we have reviewed them. We will contact you once all results  are reviewed. Please allow our office up to 72 hours to thoroughly review all of the results before contacting the office for clarification of your results.  WALK-IN LAB HOURS  Monday through Thursday from 8:00 am -12:30 pm and 1:00 pm-5:00 pm and Friday from 8:00 am-12:00 pm.  Patients with office visits requiring labs will be seen before walk-in labs.  You may encounter longer than normal wait times. Please allow additional time. Wait times may be shorter on   Monday and Thursday afternoons.  We do not book appointments for walk-in labs. We appreciate your patience and understanding with our staff.   Labs are drawn by [facility]. Please bring your co-pay at the time of your lab draw.  You may receive a bill from [facility] for your lab work.  Please note if you are on Hydroxychloroquine and and an order has been placed for a Hydroxychloroquine level,  you will need to have it drawn 4 hours or more after your last dose.  If you wish to have your labs drawn at another location, please call the office 24 hours in advance so we can fax the orders.  The office is located at [address], [place], [place] [address]   If you have any questions regarding directions or hours of operation,  please call [phone].   As a reminder, please drink plenty of water prior to coming for your lab work. Thanks!   Vaccines You are taking a medication(s) that can suppress your immune system.  The following immunizations are recommended: Flu annually Covid-19  RSV Td/Tdap (tetanus, diphtheria, pertussis) every 10 years Pneumonia (Prevnar 15 then Pneumovax 23 at least 1 year apart.  Alternatively, can take Prevnar 20 without needing additional dose) Shingrix: 2 doses from 4 weeks to 6 months apart  Please check with your PCP to make sure you are up to date.

## 2023-08-05 ENCOUNTER — Telehealth: Payer: Self-pay | Admitting: Internal Medicine

## 2023-08-05 NOTE — Telephone Encounter (Signed)
Julia Solis has had a lot of labs recently, will you check and make sure we do not duplicate any at her up coming CPE Lab appointment?

## 2023-08-06 LAB — MULTIPLE MYELOMA PANEL, SERUM
Albumin SerPl Elph-Mcnc: 3.2 g/dL (ref 2.9–4.4)
Albumin/Glob SerPl: 1.6 (ref 0.7–1.7)
Alpha 1: 0.2 g/dL (ref 0.0–0.4)
Alpha2 Glob SerPl Elph-Mcnc: 0.6 g/dL (ref 0.4–1.0)
B-Globulin SerPl Elph-Mcnc: 0.8 g/dL (ref 0.7–1.3)
Gamma Glob SerPl Elph-Mcnc: 0.6 g/dL (ref 0.4–1.8)
Globulin, Total: 2.1 g/dL — ABNORMAL LOW (ref 2.2–3.9)
IgA: 100 mg/dL (ref 64–422)
IgG (Immunoglobin G), Serum: 819 mg/dL (ref 586–1602)
IgM (Immunoglobulin M), Srm: 72 mg/dL (ref 26–217)
Total Protein ELP: 5.3 g/dL — ABNORMAL LOW (ref 6.0–8.5)

## 2023-08-09 ENCOUNTER — Other Ambulatory Visit: Payer: Self-pay | Admitting: *Deleted

## 2023-08-09 DIAGNOSIS — D472 Monoclonal gammopathy: Secondary | ICD-10-CM | POA: Diagnosis not present

## 2023-08-09 DIAGNOSIS — Z8543 Personal history of malignant neoplasm of ovary: Secondary | ICD-10-CM | POA: Diagnosis not present

## 2023-08-09 DIAGNOSIS — Z79899 Other long term (current) drug therapy: Secondary | ICD-10-CM | POA: Insufficient documentation

## 2023-08-09 DIAGNOSIS — Z90722 Acquired absence of ovaries, bilateral: Secondary | ICD-10-CM | POA: Diagnosis not present

## 2023-08-09 NOTE — Telephone Encounter (Signed)
Spoke with patient about Dr Morrie Sheldon recommendation to have an EKG completed a week after starting hydroxychloroquine. Patient was instructed to start medication after her second cataract surgery, not scheduled yet, but will contact our office back to set up a RN visit to have another EKG completed at that time. No further needs today

## 2023-08-10 DIAGNOSIS — Z961 Presence of intraocular lens: Secondary | ICD-10-CM | POA: Diagnosis not present

## 2023-08-10 DIAGNOSIS — H2511 Age-related nuclear cataract, right eye: Secondary | ICD-10-CM | POA: Diagnosis not present

## 2023-08-10 DIAGNOSIS — H25811 Combined forms of age-related cataract, right eye: Secondary | ICD-10-CM | POA: Diagnosis not present

## 2023-08-10 DIAGNOSIS — H52201 Unspecified astigmatism, right eye: Secondary | ICD-10-CM | POA: Diagnosis not present

## 2023-08-10 HISTORY — PX: CATARACT EXTRACTION: SUR2

## 2023-08-10 LAB — KAPPA/LAMBDA LIGHT CHAINS, FREE, WITH RATIO, 24HR. URINE
FR KAPPA LT CH,24HR: 43.58 mg/(24.h)
FR LAMBDA LT CH,24HR: 4.41 mg/(24.h)
Free Kappa Lt Chains,Ur: 22.35 mg/L (ref 1.17–86.46)
Free Kappa/Lambda Ratio: 9.89 (ref 1.83–14.26)
Free Lambda Lt Chains,Ur: 2.26 mg/L (ref 0.27–15.21)

## 2023-08-11 ENCOUNTER — Encounter: Payer: Self-pay | Admitting: Rheumatology

## 2023-08-12 ENCOUNTER — Ambulatory Visit (INDEPENDENT_AMBULATORY_CARE_PROVIDER_SITE_OTHER): Payer: PPO

## 2023-08-12 DIAGNOSIS — I495 Sick sinus syndrome: Secondary | ICD-10-CM

## 2023-08-13 LAB — CUP PACEART REMOTE DEVICE CHECK
Date Time Interrogation Session: 20241106100938
Implantable Lead Connection Status: 753985
Implantable Lead Connection Status: 753985
Implantable Lead Implant Date: 20220201
Implantable Lead Implant Date: 20220201
Implantable Lead Location: 753859
Implantable Lead Location: 753860
Implantable Pulse Generator Implant Date: 20220201
Pulse Gen Model: 2272
Pulse Gen Serial Number: 3896658

## 2023-08-16 DIAGNOSIS — M545 Low back pain, unspecified: Secondary | ICD-10-CM | POA: Diagnosis not present

## 2023-08-16 NOTE — Progress Notes (Signed)
Annual Wellness Visit    Patient Care Team: Cozetta Seif, Luanna Cole, MD as PCP - General (Internal Medicine) Mealor, Roberts Gaudy, MD as Consulting Physician (Cardiology) Pollyann Savoy, MD as Consulting Physician (Rheumatology)  Visit Date: 08/23/23   Chief Complaint  Patient presents with   Medicare Wellness    Subjective:   Patient: Julia Solis, Female    DOB: [birth year], 72 y.o.   MRN: [id number]  Julia Solis is a 72 y.o. Female who presents today for her Annual Wellness Visit. History of ovarian cancer 2011, GERD, hiatal hernia, kidney stones, hypertension, MGUS, osteoporosis, cardiac pacemaker, pulmonary sarcoidosis, sick sinus syndrome, seronegative rheumatoid arthritis.  History of GERD treated with famotidine 20 mg daily. She has been doing well with this.  History of dependent edema treated with furosemide 20 mg daily.   History of hypertension treated with losartan 25 mg daily. Blood pressure normal today at 140/78.  Dr. Suszanne Conners saw her and put her on famotidine and Flonase. Is to have sleep study to see if she needs CPAP device   Has ortho issues and seeing [name].  She has pain in SI joint (right side). Did have right SI joint injection with relief.  Seen here on 06/03/23 via video visit and, on my direction, she was subsequently seen in [facility] for worsening dyspnea and swelling of the hands and legs for the past several weeks. Chest X-ray showed no acute abnormality. BNP slightly elevated at 264. Discharged with prednisone 5 mg for thirty days, furosemide 20 mg daily for seven days. History of pulmonary sarcoidosis.  She has some pain in her wrists.   Diagnosed with seronegative rheumatoid arthritis by Dr. Corliss Skains on 08/04/23. She will start on Plaquenil 200 mg twice daily following upcoming cataract surgery. Currently taking prednisone and will taper off of this when she starts Plaquenil.  Blood work done at rheumatologist office in October and  November. 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 Immunoelectrophoresis showed faint IgG lambda monoclonal protein. 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 Archie Patten Pasam.   She has had back pain recently due to lifting up her granddaughter. She is doing PT and this is helping. 07/15/22 MRI lumbar spine without contrast showed: 1) Ordinary lumbar spine degeneration as described, 2) L4-5 moderate right foraminal stenosis. Prior left-sided decompression at this level with patent spinal canal, 3) L5-S1 bilateral subarticular recess narrowing that crowds the S1 nerve roots. Moderate right foraminal narrowing.  She has a history of pulmonary sarcoidosis followed by Dr. Edward Jolly at [facility]. She reports no significant shortness of breath. She has had extensive evaluation. Overnight oximetry showed SpO2 dipping below 89%. She had bone biopsy showing granulomatous inflammation. She has known since the 1990s that she had an abnormal chest x-ray. History of mild pulmonary hypertension. Was diagnosed in 2019 after having a renal stone and CT suggested lymphadenopathy.  Had PET scan which was abnormal and subsequently diagnosed with sarcoidosis.   Had lithotripsy for treatment of renal stone in 2019.   History of lumbar disc disease status post lumbar surgery by Dr. Roxan Hockey in 1997.  Has some chronic numbness left foot from that surgery.  Seen in [facility] on 06/03/23 for worsening dyspnea and swelling of the hands and legs. Chest X-ray showed no acute abnormality. BNP slightly elevated at 264. Discharged with prednisone 5 mg for thirty days, furosemide 20 mg daily for seven days. She still had some swelling on  06/11/23 follow-up here.  She has a remote history of ovarian cancer 1C grade 3 clear-cell status post debulking surgery March 2011 by Dr. Nelly Rout.  Had total abdominal hysterectomy, BSO, bilateral pelvic node dissection.  Had  aortic node sampling, omentectomy, appendectomy and biopsies.  Lymph nodes were negative for malignancy.  Symptoms started February 2011 with abdominal gas and bloating.  CT of the chest at that time showed tiny nodules thought to be benign but in retrospect could have been sarcoidosis.   Additional past medical history: Ectopic pregnancy 1980, benign left breast biopsy 1993.  C-sections in 1992 in 1995.  Ganglion cyst removed from wrist in 1975.  Tonsillectomy 1957.  Had brow lift in 2008.   Mammogram normal on 12/01/22.  Colonoscopy last done on 11/13/21 by Dr. Tressia Danas. Showed non-bleeding internal hemorrhoids, diverticulosis in sigmoid colon, four 1-3 mm polyps in ascending colon. Pathology showed 4 tubular adenomas. Repeat recommended in 2026.  Social history: Divorced.  Has 2 adult daughters.  Does not smoke but did smoke from the ages of 72 through 74.  Social alcohol consumption.  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   Family history: Father died at age 83 with prostate cancer.  Brother died at age 33 of an AVM rupture.  1 sister in good health.  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   Complication of anesthesia    GERD (gastroesophageal reflux disease)    Hearing aid worn    both ears   History of chemotherapy 2011   History of hiatal hernia    History of kidney stones    Hypertension    MGUS (monoclonal gammopathy of unknown significance)    dx by hematology per patient   Neuromuscular disorder (HCC)    left foot numbness since  eback surgery   Osteoporosis    PONV (postoperative nausea and vomiting)    Presence of permanent cardiac pacemaker    Sarcoidosis    in remission sees pumonary  once a year dr bellinger lov 08-21-2020 care [facility]   Sick sinus syndrome (HCC)    Wears glasses      Family History  Problem Relation Age of Onset   Hypertension Mother    Prostate cancer Father 62   Cancer Father     Hypertension Father    Stroke Father    Colon polyps Sister    Colon cancer Maternal Grandmother        dx in her 10s   Heart disease Maternal Grandfather    Colon cancer Paternal Grandmother    Heart disease Paternal Grandfather    Breast cancer Paternal Aunt 77   Colon cancer Paternal Aunt        dx in her 37s   Melanoma Cousin 38   Breast cancer Cousin 99       multiple cousins   Colon polyps Cousin    Breast cancer Cousin 20   Breast cancer Cousin        father's maternal 1st cousin   Breast cancer Cousin        Father's first cousin's daughter   Cancer Other        maternal great uncle with Cancer - NOS   Cancer Other        maternal great grandmother with either colon or stomach cancer   Breast cancer Other        fathers maternal aunt  Review of Systems  Constitutional:  Negative for chills, fever, malaise/fatigue and weight loss.  HENT:  Negative for hearing loss, sinus pain and sore throat.   Respiratory:  Negative for cough, hemoptysis and shortness of breath.   Cardiovascular:  Negative for chest pain, palpitations, leg swelling and PND.  Gastrointestinal:  Negative for abdominal pain, constipation, diarrhea, heartburn, nausea and vomiting.  Genitourinary:  Negative for dysuria, frequency and urgency.  Musculoskeletal:  Positive for back pain and joint pain (Wrists). Negative for myalgias and neck pain.  Skin:  Negative for itching and rash.  Neurological:  Negative for dizziness, tingling, seizures and headaches.  Endo/Heme/Allergies:  Negative for polydipsia.  Psychiatric/Behavioral:  Negative for depression. The patient is not nervous/anxious.       Objective:   Vitals: BP (!) 140/78   Pulse 78   Ht 5' 4.5" (1.638 m)   Wt 172 lb (78 kg)   SpO2 96%   BMI 29.07 kg/m   Physical Exam Vitals and nursing note reviewed.  Constitutional:      General: She is not in acute distress.    Appearance: Normal appearance. She is not ill-appearing or  toxic-appearing.  HENT:     Head: Normocephalic and atraumatic.     Right Ear: Hearing, tympanic membrane, ear canal and external ear normal.     Left Ear: Hearing, tympanic membrane, ear canal and external ear normal.     Mouth/Throat:     Pharynx: Oropharynx is clear.  Eyes:     Extraocular Movements: Extraocular movements intact.     Pupils: Pupils are equal, round, and reactive to light.  Neck:     Thyroid: No thyroid mass, thyromegaly or thyroid tenderness.     Vascular: No carotid bruit.  Cardiovascular:     Rate and Rhythm: Normal rate and regular rhythm. No extrasystoles are present.    Heart sounds: Normal heart sounds. No murmur heard.    No friction rub. No gallop.  Pulmonary:     Effort: Pulmonary effort is normal.     Breath sounds: Normal breath sounds. No decreased breath sounds, wheezing, rhonchi or rales.  Chest:     Chest wall: No mass.  Breasts:    Right: No mass.     Left: No mass.  Abdominal:     Palpations: Abdomen is soft. There is no hepatomegaly, splenomegaly or mass.     Tenderness: There is no abdominal tenderness.     Hernia: No hernia is present.  Musculoskeletal:     Cervical back: Normal range of motion.     Right lower leg: No edema.     Left lower leg: No edema.     Comments: 3/5 weakness dorsiflexion left foot. 3/5 weakness left lower leg flexion. 5/5 muscle strength right lower extremity.  Lymphadenopathy:     Cervical: No cervical adenopathy.     Upper Body:     Right upper body: No supraclavicular adenopathy.     Left upper body: No supraclavicular adenopathy.  Skin:    General: Skin is warm and dry.  Neurological:     General: No focal deficit present.     Mental Status: She is alert and oriented to person, place, and time. Mental status is at baseline.     Sensory: Sensation is intact.     Motor: Motor function is intact. No weakness.     Deep Tendon Reflexes: Reflexes are normal and symmetric.  Psychiatric:        Attention and  Perception: Attention normal.        Mood and Affect: Mood normal.        Speech: Speech normal.        Behavior: Behavior normal.        Thought Content: Thought content normal.        Cognition and Memory: Cognition normal.        Judgment: Judgment normal.      Most recent functional status assessment:    08/23/2023   10:57 AM  In your present state of health, do you have any difficulty performing the following activities:  Hearing? 0  Vision? 0  Difficulty concentrating or making decisions? 0  Walking or climbing stairs? 1  Dressing or bathing? 0  Doing errands, shopping? 0  Preparing Food and eating ? N  Using the Toilet? N  In the past six months, have you accidently leaked urine? N  Do you have problems with loss of bowel control? N  Managing your Medications? N  Managing your Finances? N  Housekeeping or managing your Housekeeping? N   Most recent fall risk assessment:    08/23/2023   10:58 AM  Fall Risk   Falls in the past year? 0  Number falls in past yr: 0  Injury with Fall? 0  Risk for fall due to : No Fall Risks  Follow up Education provided;Falls evaluation completed;Falls prevention discussed    Most recent depression screenings:    08/03/2023   10:41 AM 02/01/2023    2:13 PM  PHQ 2/9 Scores  PHQ - 2 Score 0 0   Most recent cognitive screening:    08/23/2023   11:06 AM  6CIT Screen  What Year? 0 points  What month? 0 points  What time? 0 points  Count back from 20 0 points  Months in reverse 0 points  Repeat phrase 0 points  Total Score 0 points     Results:   Studies obtained and personally reviewed by me:  Mammogram normal on 12/01/22.  Colonoscopy last done on 11/13/21 by Dr. Tressia Danas. Showed non-bleeding internal hemorrhoids, diverticulosis in sigmoid colon, four 1-3 mm polyps in ascending colon. Pathology showed 4 tubular adenomas. Repeat recommended in 2026.  07/15/22 MRI lumbar spine without contrast showed: 1) Ordinary  lumbar spine degeneration as described, 2) L4-5 moderate right foraminal stenosis. Prior left-sided decompression at this level with patent spinal canal, 3) L5-S1 bilateral subarticular recess narrowing that crowds the S1 nerve roots. Moderate right foraminal narrowing.  Labs:       Component Value Date/Time   NA 142 08/20/2023 0923   NA 144 10/28/2020 1636   K 5.0 08/20/2023 0923   CL 108 08/20/2023 0923   CO2 29 08/20/2023 0923   GLUCOSE 87 08/20/2023 0923   BUN 16 08/20/2023 0923   BUN 16 10/28/2020 1636   CREATININE 0.78 08/20/2023 0923   CALCIUM 9.2 08/20/2023 0923   PROT 5.7 (L) 08/20/2023 0923   ALBUMIN 3.6 08/03/2023 1117   AST 12 08/20/2023 0923   ALT 6 08/20/2023 0923   ALKPHOS 40 08/03/2023 1117   BILITOT 0.5 08/20/2023 0923   GFRNONAA >60 08/03/2023 1117   GFRNONAA 82 07/11/2020 0949   GFRAA 93 10/28/2020 1636   GFRAA 95 07/11/2020 0949     Lab Results  Component Value Date   WBC 7.6 08/20/2023   HGB 12.2 08/20/2023   HCT 39.0 08/20/2023   MCV 88.6 08/20/2023   PLT 220 08/20/2023    Lab  Results  Component Value Date   CHOL 210 (H) 08/20/2023   HDL 73 08/20/2023   LDLCALC 117 (H) 08/20/2023   TRIG 95 08/20/2023   CHOLHDL 2.9 08/20/2023    Lab Results  Component Value Date   HGBA1C 5.4 08/20/2023     Lab Results  Component Value Date   TSH 3.04 08/20/2023    Assessment & Plan:   GERD: stable with famotidine 20 mg daily. She has been doing well with this.  Dependent edema: treated with furosemide 20 mg daily.   Hypertension: treated with losartan 25 mg daily. Blood pressure normal today at 140/78.  Seronegative rheumatoid arthritis: currently taking prednisone and will start Plaquenil 200 mg twice daily following upcoming cataract surgery. Diagnosed by Dr. Corliss Skains on 08/04/23.  MGUS: diagnosed by oncologist, Dr. Meryl Crutch, on 08/03/23.   Lumbar stenosis: had a flare-up recently due to lifting up her granddaughter. She is doing PT and  this is helping.   Musculoskeletal pain treated with Mobic daily if needed   Constipation treated with MiraLAX  Insomnia treated with as needed lorazepam   History of pulmonary sarcoid followed at [facility]   Remote history of ovarian cancer in 2011  Mammogram normal on 12/01/22.  Colonoscopy last done on 11/13/21 by Dr. Tressia Danas. Showed non-bleeding internal hemorrhoids, diverticulosis in sigmoid colon, four 1-3 mm polyps in ascending colon. Pathology showed 4 tubular adenomas. Repeat recommended in 2026.  Vaccine counseling: UTD on flu booster.  Return in 1 year or as needed.     Annual wellness visit done today including the all of the following: Reviewed patient's Family Medical History Reviewed and updated list of patient's medical providers Assessment of cognitive impairment was done Assessed patient's functional ability Established a written schedule for health screening services Health Risk Assessent Completed and Reviewed  Discussed health benefits of physical activity, and encouraged her to engage in regular exercise appropriate for her age and condition.        I,Alexander Ruley,acting as a Neurosurgeon for Margaree Mackintosh, MD.,have documented all relevant documentation on the behalf of Margaree Mackintosh, MD,as directed by  Margaree Mackintosh, MD while in the presence of Margaree Mackintosh, MD.   I, Margaree Mackintosh, MD, have reviewed all documentation for this visit. The documentation on 09/03/23 for the exam, diagnosis, procedures, and orders are all accurate and complete.

## 2023-08-17 DIAGNOSIS — G4733 Obstructive sleep apnea (adult) (pediatric): Secondary | ICD-10-CM | POA: Diagnosis not present

## 2023-08-18 DIAGNOSIS — M25552 Pain in left hip: Secondary | ICD-10-CM | POA: Diagnosis not present

## 2023-08-19 ENCOUNTER — Other Ambulatory Visit: Payer: Self-pay | Admitting: Internal Medicine

## 2023-08-19 DIAGNOSIS — G5601 Carpal tunnel syndrome, right upper limb: Secondary | ICD-10-CM | POA: Diagnosis not present

## 2023-08-20 ENCOUNTER — Other Ambulatory Visit: Payer: PPO

## 2023-08-20 DIAGNOSIS — E7800 Pure hypercholesterolemia, unspecified: Secondary | ICD-10-CM

## 2023-08-20 DIAGNOSIS — Z0000 Encounter for general adult medical examination without abnormal findings: Secondary | ICD-10-CM | POA: Diagnosis not present

## 2023-08-20 DIAGNOSIS — R768 Other specified abnormal immunological findings in serum: Secondary | ICD-10-CM

## 2023-08-20 DIAGNOSIS — Z1329 Encounter for screening for other suspected endocrine disorder: Secondary | ICD-10-CM | POA: Diagnosis not present

## 2023-08-20 DIAGNOSIS — M810 Age-related osteoporosis without current pathological fracture: Secondary | ICD-10-CM

## 2023-08-21 LAB — COMPLETE METABOLIC PANEL WITH GFR
AG Ratio: 1.9 (calc) (ref 1.0–2.5)
ALT: 6 U/L (ref 6–29)
AST: 12 U/L (ref 10–35)
Albumin: 3.7 g/dL (ref 3.6–5.1)
Alkaline phosphatase (APISO): 44 U/L (ref 37–153)
BUN: 16 mg/dL (ref 7–25)
CO2: 29 mmol/L (ref 20–32)
Calcium: 9.2 mg/dL (ref 8.6–10.4)
Chloride: 108 mmol/L (ref 98–110)
Creat: 0.78 mg/dL (ref 0.60–1.00)
Globulin: 2 g/dL (ref 1.9–3.7)
Glucose, Bld: 87 mg/dL (ref 65–99)
Potassium: 5 mmol/L (ref 3.5–5.3)
Sodium: 142 mmol/L (ref 135–146)
Total Bilirubin: 0.5 mg/dL (ref 0.2–1.2)
Total Protein: 5.7 g/dL — ABNORMAL LOW (ref 6.1–8.1)
eGFR: 81 mL/min/{1.73_m2} (ref >=60)

## 2023-08-21 LAB — LIPID PANEL
Cholesterol: 210 mg/dL — ABNORMAL HIGH (ref <200)
HDL: 73 mg/dL (ref >=50)
LDL Cholesterol (Calc): 117 mg/dL — ABNORMAL HIGH
Non-HDL Cholesterol (Calc): 137 mg/dL — ABNORMAL HIGH (ref <130)
Total CHOL/HDL Ratio: 2.9 (calc) (ref <5.0)
Triglycerides: 95 mg/dL (ref <150)

## 2023-08-21 LAB — CBC WITH DIFFERENTIAL/PLATELET
Absolute Lymphocytes: 2227 {cells}/uL (ref 850–3900)
Absolute Monocytes: 798 {cells}/uL (ref 200–950)
Basophils Absolute: 30 {cells}/uL (ref 0–200)
Basophils Relative: 0.4 %
Eosinophils Absolute: 220 {cells}/uL (ref 15–500)
Eosinophils Relative: 2.9 %
HCT: 39 % (ref 35.0–45.0)
Hemoglobin: 12.2 g/dL (ref 11.7–15.5)
MCH: 27.7 pg (ref 27.0–33.0)
MCHC: 31.3 g/dL — ABNORMAL LOW (ref 32.0–36.0)
MCV: 88.6 fL (ref 80.0–100.0)
MPV: 10 fL (ref 7.5–12.5)
Monocytes Relative: 10.5 %
Neutro Abs: 4324 {cells}/uL (ref 1500–7800)
Neutrophils Relative %: 56.9 %
Platelets: 220 10*3/uL (ref 140–400)
RBC: 4.4 10*6/uL (ref 3.80–5.10)
RDW: 14.7 % (ref 11.0–15.0)
Total Lymphocyte: 29.3 %
WBC: 7.6 10*3/uL (ref 3.8–10.8)

## 2023-08-21 LAB — HEMOGLOBIN A1C
Hgb A1c MFr Bld: 5.4 %{Hb} (ref <5.7)
Mean Plasma Glucose: 108 mg/dL
eAG (mmol/L): 6 mmol/L

## 2023-08-21 LAB — TSH: TSH: 3.04 m[IU]/L (ref 0.40–4.50)

## 2023-08-23 ENCOUNTER — Telehealth: Payer: Self-pay

## 2023-08-23 ENCOUNTER — Ambulatory Visit (INDEPENDENT_AMBULATORY_CARE_PROVIDER_SITE_OTHER): Payer: PPO | Admitting: Internal Medicine

## 2023-08-23 ENCOUNTER — Inpatient Hospital Stay: Payer: PPO | Attending: Oncology | Admitting: Oncology

## 2023-08-23 ENCOUNTER — Encounter: Payer: Self-pay | Admitting: Internal Medicine

## 2023-08-23 ENCOUNTER — Encounter: Payer: Self-pay | Admitting: Oncology

## 2023-08-23 VITALS — BP 140/78 | HR 78 | Ht 64.5 in | Wt 172.0 lb

## 2023-08-23 DIAGNOSIS — D860 Sarcoidosis of lung: Secondary | ICD-10-CM

## 2023-08-23 DIAGNOSIS — E7800 Pure hypercholesterolemia, unspecified: Secondary | ICD-10-CM

## 2023-08-23 DIAGNOSIS — E7439 Other disorders of intestinal carbohydrate absorption: Secondary | ICD-10-CM

## 2023-08-23 DIAGNOSIS — Z0000 Encounter for general adult medical examination without abnormal findings: Secondary | ICD-10-CM

## 2023-08-23 DIAGNOSIS — F419 Anxiety disorder, unspecified: Secondary | ICD-10-CM

## 2023-08-23 DIAGNOSIS — M810 Age-related osteoporosis without current pathological fracture: Secondary | ICD-10-CM

## 2023-08-23 DIAGNOSIS — M0600 Rheumatoid arthritis without rheumatoid factor, unspecified site: Secondary | ICD-10-CM | POA: Diagnosis not present

## 2023-08-23 DIAGNOSIS — Z950 Presence of cardiac pacemaker: Secondary | ICD-10-CM | POA: Diagnosis not present

## 2023-08-23 DIAGNOSIS — D472 Monoclonal gammopathy: Secondary | ICD-10-CM

## 2023-08-23 DIAGNOSIS — H905 Unspecified sensorineural hearing loss: Secondary | ICD-10-CM | POA: Diagnosis not present

## 2023-08-23 DIAGNOSIS — Z8679 Personal history of other diseases of the circulatory system: Secondary | ICD-10-CM | POA: Diagnosis not present

## 2023-08-23 DIAGNOSIS — R768 Other specified abnormal immunological findings in serum: Secondary | ICD-10-CM | POA: Diagnosis not present

## 2023-08-23 DIAGNOSIS — Z8543 Personal history of malignant neoplasm of ovary: Secondary | ICD-10-CM

## 2023-08-23 NOTE — Assessment & Plan Note (Signed)
-   Stage 1 disease, diagnosed in early 2011. S/p optimal surgical debulking (TAH/BSO, omentectomy, pelvic and periaortic LND) in March 2011 and 6 cycles of carbo/taxol in July 2011. She was undergoing surveillance since that time with no evidence of recurrence.  - Last seen by her oncologist at outside facility in January 2021.  CA-125 was 11, normal.  She was discharged from their office. -Clinically no signs or symptoms to suggest disease recurrence.  Routine imaging is not indicated as per NCCN guidelines.  She does get CT of the chest at her pulmonologist office for her history of sarcoidosis and sometimes they will include CT abdomen pelvis for surveillance.

## 2023-08-23 NOTE — Telephone Encounter (Signed)
Patient advised Patient will require updated lab work 1 month, 3 months, then every 5 months after starting plaquenil. Patient verbalized understanding. Patient states she plans to start the Plaquenil toward the end of the month and then she plans to come in a few days before her appointment on 10/05/2023 to get labs done so that they can be reviewing at the appointment.

## 2023-08-23 NOTE — Telephone Encounter (Signed)
Patient states she is supposed to get labs done 1 month, 3 months, and  every 5 months because she is going to start Plaquenil. Patient states she has not started the Plaquenil yet due to cataract surgery. Patient states last surgery is on 08/24/2023. Patient states she is going to finish the prednisone before she starts the plaquenil. Patient states she plans to start the medication toward the end of this month. Patient inquires if she is supposed to get labs a month from starting the Plaquenil or a month from her lest appointment date. Patient inquires when to get labs before her next appointment so that the results can be gone over at the next appointment. Patient's next appointment is on 10/05/2023. Please advise.

## 2023-08-23 NOTE — Progress Notes (Signed)
[facility]  HEMATOLOGY-ONCOLOGY ELECTRONIC VISIT PROGRESS NOTE  Patient Care Team: Baxley, Luanna Cole, MD as PCP - General (Internal Medicine) Mealor, Roberts Gaudy, MD as Consulting Physician (Cardiology) Pollyann Savoy, MD as Consulting Physician (Rheumatology)  I connected with the patient via telephone conference and verified that I am speaking with the correct person using two identifiers. The patient's location is at home and I am providing care from the [facility].  I discussed the limitations, risks, security and privacy concerns of performing an evaluation and management service by e-visits and the availability of in person appointments.  I also discussed with the patient that there may be a patient responsible charge related to this service. The patient expressed understanding and agreed to proceed.   ASSESSMENT & PLAN:   72 y.o. lady with a past medical history of sarcoidosis, pulmonary hypertension, sick sinus syndrome, hypertension, osteoporosis, renal stones, history of stage I ovarian cancer diagnosed in 2011, in remission, degenerative joint disease, was referred to our service in October 2024 for evaluation of monoclonal gammopathy.    Monoclonal gammopathy of unknown significance Faint IgG lambda detected on serum immunoelectrophoresis on routine blood work done at her rheumatologist office.  -Discussed diagnosis, implications, plan of care. 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   -Discussed small risk of progression to smoldering myeloma or actual multiple myeloma and hence continued monitoring is recommended.   -To complete workup, on her initial consultation with [place] on 08/03/2023, we obtained serum free light chains, repeat SPEP and obtain 24-hour urine protein electrophoresis and immunoelectrophoresis.   SPEP showed no evidence of M spike.  IFE showed faint IgG band.  Serum free kappa and lambda were close to normal with normal ratio.  24-hour urine protein electrophoresis also came back unremarkable.  No CRAB features.  -Clinical picture remains consistent with MGUS.  Patient was provided reassurance.  Will continue surveillance.  Initially we will repeat labs in 3 months and later on every 6 to 12 months. -Plan for follow-up visit in January 2025 with labs to be done 10 days prior.  History of ovarian cancer - Stage 1 disease, diagnosed in early 2011. S/p optimal surgical debulking (TAH/BSO, omentectomy, pelvic and periaortic LND) in March 2011 and 6 cycles of carbo/taxol in July 2011. She was undergoing surveillance since that time with no evidence of recurrence.  - Last seen by her oncologist at outside facility in January 2021.  CA-125 was 11, normal.  She was discharged from their office. -Clinically no signs or symptoms to suggest disease recurrence.  Routine imaging is not indicated as per NCCN guidelines.  She does get CT of the chest at her pulmonologist office for her history of sarcoidosis and sometimes they will include CT abdomen pelvis for surveillance.  Seronegative rheumatoid arthritis (HCC) -Recently diagnosed.  Her rheumatologist is planning to start her on hydroxychloroquine.  No contraindications from our standpoint.   Orders Placed This Encounter  Procedures   CBC with Differential/Platelet    Standing Status:   Future    Standing Expiration Date:   08/22/2024   Comprehensive metabolic panel    Standing Status:   Future    Standing Expiration Date:   08/22/2024   Lactate dehydrogenase    Standing Status:   Future    Standing Expiration Date:   08/22/2024   Multiple Myeloma Panel (SPEP&IFE w/QIG)  Standing Status:   Future    Standing Expiration Date:   08/22/2024   Kappa/lambda light chains    Standing Status:   Future    Standing Expiration Date:   08/22/2024     INTERVAL HISTORY:  Please see above for problem oriented charting.  The purpose of today's discussion is to explain recent lab results and formulate plan of care.  HEMATOLOGY HISTORY:  72 y.o. lady with a past medical history of sarcoidosis, pulmonary hypertension, sick sinus syndrome, hypertension, osteoporosis, renal stones, history of stage I ovarian cancer diagnosed in 2011, in remission, degenerative joint disease, was referred to our service for evaluation of monoclonal gammopathy.     Discussed the use of AI scribe software for clinical note transcription with the patient, who gave verbal consent to proceed.    She was recently referred to a rheumatologist by her PCP.  Her rheumatologist pursued workup for suspected polymyalgia rheumatica (PMR) or rheumatoid arthritis (RA).  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  Immunoelectrophoresis showed faint IgG lambda monoclonal protein.  Given this finding, she was referred to [place] for further evaluation.    To complete workup, on her initial consultation with [place] on 08/03/2023, we obtained serum free light chains, repeat SPEP and obtain 24-hour urine protein electrophoresis and immunoelectrophoresis.  SPEP showed no evidence of M spike.  IFE showed faint IgG band.  Serum free kappa and lambda were close to normal with normal ratio.  24-hour urine protein electrophoresis also came back unremarkable.  No CRAB features.   Clinical picture remains consistent with MGUS.  Patient was provided reassurance.  Will continue surveillance.  Initially we will repeat labs in 3 months and later on every 6 to 12 months.  REVIEW OF SYSTEMS:    Review of Systems - Oncology  All other pertinent systems were reviewed with the patient and are negative.  I have reviewed the past medical history, past surgical history, social history and family history with the patient and they are unchanged from previous  note.  ALLERGIES:  She is allergic to flomax [tamsulosin hcl], latex, tramadol, and ciprofloxacin.  MEDICATIONS:  Current Outpatient Medications  Medication Sig Dispense Refill   Cholecalciferol (VITAMIN D) 50 MCG (2000 UT) tablet Take 2,000 Units by mouth daily.     cyclobenzaprine (FLEXERIL) 5 MG tablet Take 5-10 mg by mouth at bedtime as needed.     famotidine (PEPCID) 10 MG tablet Take 20 mg by mouth daily as needed for heartburn or indigestion. (Patient not taking: Reported on 07/06/2023)     famotidine (PEPCID) 20 MG tablet Take 20 mg by mouth at bedtime.     fluticasone (FLONASE) 50 MCG/ACT nasal spray Place into the nose 3 (three) times a week.     furosemide (LASIX) 20 MG tablet Take 1 tablet (20 mg total) by mouth daily. 30 tablet 1   hydroxychloroquine (PLAQUENIL) 200 MG tablet Take 1 tablet 200 mg twice daily from Monday through Friday only. DO NOT TAKE ON SATURDAY AND SUNDAY 120 tablet 0   loratadine (CLARITIN) 10 MG tablet Take 10 mg by mouth daily.     LORazepam (ATIVAN) 1 MG tablet TAKE 1 TABLET BY MOUTH EVERY DAY AT BEDTIME AS NEEDED 90 tablet 0   losartan (COZAAR) 25 MG tablet TAKE 1 TABLET (25 MG TOTAL) BY MOUTH DAILY. 90 tablet 3   meloxicam (MOBIC) 15 MG tablet Take 1 tablet (15 mg total) by mouth as needed. PRN (Patient not taking:  Reported on 08/23/2023) 30 tablet 0   Multiple Vitamin (MULTIVITAMIN WITH MINERALS) TABS tablet Take 1 tablet by mouth daily.     oxyCODONE (OXY IR/ROXICODONE) 5 MG immediate release tablet Take 5 mg by mouth daily as needed.     Polyethyl Glycol-Propyl Glycol (SYSTANE) 0.4-0.3 % GEL ophthalmic gel Place 1 application  into both eyes in the morning and at bedtime.     predniSONE (DELTASONE) 10 MG tablet Take 1 tablet (10 mg total) by mouth daily with breakfast. Rx # 1 14 tablet 0   predniSONE (DELTASONE) 5 MG tablet Rx # 2: Take 2 tab by mouth once daily x 1 week then 1.5 tab once daily x 1 week, then 1 tab once daily x 1 week, then half tablet  once daily x 7 days then stop. 35 tablet 0   rosuvastatin (CRESTOR) 5 MG tablet TAKE 1 TABLET BY MOUTH 3 TIMES A WEEK WITH SUPPER 36 tablet 2   No current facility-administered medications for this visit.    PHYSICAL EXAMINATION: ECOG PERFORMANCE STATUS: 1 - Symptomatic but completely ambulatory  LABORATORY DATA:   I have reviewed the data as listed     Latest Ref Rng & Units 08/20/2023    9:23 AM 08/03/2023   11:17 AM 07/06/2023   10:33 AM  CMP  Glucose 65 - 99 mg/dL 87  578  84   BUN 7 - 25 mg/dL 16  30  23    Creatinine 0.60 - 1.00 mg/dL 4.69  6.29  5.28   Sodium 135 - 146 mmol/L 142  137  141   Potassium 3.5 - 5.3 mmol/L 5.0  3.6  3.7   Chloride 98 - 110 mmol/L 108  103  106   CO2 20 - 32 mmol/L 29  29  25    Calcium 8.6 - 10.4 mg/dL 9.2  9.1  8.9   Total Protein 6.1 - 8.1 g/dL 5.7  5.7  6.0   Total Bilirubin 0.2 - 1.2 mg/dL 0.5  0.4    Alkaline Phos 38 - 126 U/L  40    AST 10 - 35 U/L 12  15    ALT 6 - 29 U/L 6  9      Lab Results  Component Value Date   WBC 7.6 08/20/2023   HGB 12.2 08/20/2023   HCT 39.0 08/20/2023   MCV 88.6 08/20/2023   PLT 220 08/20/2023   NEUTROABS 4,324 08/20/2023    I discussed the assessment and treatment plan with the patient. The patient was provided an opportunity to ask questions and all were answered. The patient agreed with the plan and demonstrated an understanding of the instructions. The patient was advised to call back or seek an in-person evaluation if the symptoms worsen or if the condition fails to improve as anticipated.    I spent 25 minutes for the appointment reviewing test results, discuss management and coordination of care.  Meryl Crutch, MD 08/23/2023 3:20 PM [facility] - [facility] OF [facility] [address] [place] [address] Dept: [phone] Dept Fax: [phone]

## 2023-08-23 NOTE — Assessment & Plan Note (Signed)
-  Recently diagnosed.  Her rheumatologist is planning to start her on hydroxychloroquine.  No contraindications from our standpoint.

## 2023-08-23 NOTE — Telephone Encounter (Signed)
Patient will require updated lab work 1 month, 3 months, then every 5 months after starting plaquenil.

## 2023-08-23 NOTE — Assessment & Plan Note (Addendum)
Faint IgG lambda detected on serum immunoelectrophoresis on routine blood work done at her rheumatologist office.  -Discussed diagnosis, implications, plan of care. 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   -Discussed small risk of progression to smoldering myeloma or actual multiple myeloma and hence continued monitoring is recommended.   -To complete workup, on her initial consultation with [place] on 08/03/2023, we obtained serum free light chains, repeat SPEP and obtain 24-hour urine protein electrophoresis and immunoelectrophoresis.  SPEP showed no evidence of M spike.  IFE showed faint IgG band.  Serum free kappa and lambda were close to normal with normal ratio.  24-hour urine protein electrophoresis also came back unremarkable.  No CRAB features.  -Clinical picture remains consistent with MGUS.  Patient was provided reassurance.  Will continue surveillance.  Initially we will repeat labs in 3 months and later on every 6 to 12 months. -Plan for follow-up visit in January 2025 with labs to be done 10 days prior.

## 2023-08-24 DIAGNOSIS — H52202 Unspecified astigmatism, left eye: Secondary | ICD-10-CM | POA: Diagnosis not present

## 2023-08-24 DIAGNOSIS — Z961 Presence of intraocular lens: Secondary | ICD-10-CM | POA: Diagnosis not present

## 2023-08-24 DIAGNOSIS — H25812 Combined forms of age-related cataract, left eye: Secondary | ICD-10-CM | POA: Diagnosis not present

## 2023-08-24 DIAGNOSIS — H268 Other specified cataract: Secondary | ICD-10-CM | POA: Diagnosis not present

## 2023-08-24 DIAGNOSIS — H2512 Age-related nuclear cataract, left eye: Secondary | ICD-10-CM | POA: Diagnosis not present

## 2023-08-24 HISTORY — PX: CATARACT EXTRACTION: SUR2

## 2023-08-25 NOTE — Progress Notes (Signed)
Remote pacemaker transmission.   

## 2023-08-30 DIAGNOSIS — M25552 Pain in left hip: Secondary | ICD-10-CM | POA: Diagnosis not present

## 2023-09-01 NOTE — Telephone Encounter (Signed)
Spoke with patient, EKG nurse visit scheduled for 12/4 at 2 pm. No further needs at this time

## 2023-09-03 NOTE — Patient Instructions (Signed)
I am glad you are feeling better with regard to hand arthritis.  Labs are stable.  Please continue current medications and follow-up in 6 months or as needed.  You have excellent consultants for your medical issues.

## 2023-09-08 ENCOUNTER — Ambulatory Visit: Payer: PPO | Attending: Internal Medicine

## 2023-09-08 VITALS — HR 95 | Ht 64.5 in | Wt 172.4 lb

## 2023-09-08 DIAGNOSIS — I495 Sick sinus syndrome: Secondary | ICD-10-CM

## 2023-09-08 NOTE — Progress Notes (Signed)
   Nurse Visit   Date of Encounter: 09/08/2023 ID: Julia Solis, DOB [birth year], MRN [id number]  PCP:  Margaree Mackintosh, MD   [facility] Providers Cardiologist:  None      Visit Details   VS:  Pulse 95   Ht 5' 4.5" (1.638 m)   Wt 172 lb 6.4 oz (78.2 kg)   SpO2 96%   BMI 29.14 kg/m  , BMI Body mass index is 29.14 kg/m.  Wt Readings from Last 3 Encounters:  09/08/23 172 lb 6.4 oz (78.2 kg)  08/23/23 172 lb (78 kg)  08/04/23 176 lb 6.4 oz (80 kg)     Reason for visit: 1 week EKG check after starting Hydroxychloroquine  Performed today: Vitals, EKG, Provider consulted:Dr. Mealor, and Education Changes (medications, testing, etc.) : None Length of Visit: 10 minutes    Medications Adjustments/Labs and Tests Ordered: Orders Placed This Encounter  Procedures   EKG 12-Lead   EKG 12-Lead   No orders of the defined types were placed in this encounter.    Gevena Mart, RN  09/08/2023 2:23 PM

## 2023-09-08 NOTE — Patient Instructions (Addendum)
Medication Instructions:  Your physician recommends that you continue on your current medications as directed. Please refer to the Current Medication list given to you today.  *If you need a refill on your cardiac medications before your next appointment, please call your pharmacy*  Lab Work: None ordered today. If you have labs (blood work) drawn today and your tests are completely normal, you will receive your results only by: MyChart Message (if you have MyChart) OR A paper copy in the mail If you have any lab test that is abnormal or we need to change your treatment, we will call you to review the results.  Testing/Procedures: 1 week EKG check after starting Hydroxychloroquine completed today 09/08/23  Follow-Up: At [facility], you and your health needs are our priority.  As part of our continuing mission to provide you with exceptional heart care, we have created designated Provider Care Teams.  These Care Teams include your primary Cardiologist (physician) and Advanced Practice Providers (APPs -  Physician Assistants and Nurse Practitioners) who all work together to provide you with the care you need, when you need it.

## 2023-09-13 DIAGNOSIS — M25552 Pain in left hip: Secondary | ICD-10-CM | POA: Diagnosis not present

## 2023-09-15 DIAGNOSIS — M25552 Pain in left hip: Secondary | ICD-10-CM | POA: Diagnosis not present

## 2023-09-16 DIAGNOSIS — G4733 Obstructive sleep apnea (adult) (pediatric): Secondary | ICD-10-CM | POA: Diagnosis not present

## 2023-09-20 ENCOUNTER — Encounter: Payer: Self-pay | Admitting: Rheumatology

## 2023-09-20 DIAGNOSIS — M25552 Pain in left hip: Secondary | ICD-10-CM | POA: Diagnosis not present

## 2023-09-20 NOTE — Telephone Encounter (Signed)
Ok to take tylenol PRN.   She should try to avoid the use of NSAIDs while taking prednisone.

## 2023-09-20 NOTE — Telephone Encounter (Signed)
I called patient, patient verbalized understanding. 

## 2023-09-21 NOTE — Progress Notes (Deleted)
Office Visit Note  Patient: Julia Solis             Date of Birth: [birth year]           MRN: [id number]             PCP: Margaree Mackintosh, MD Referring: Margaree Mackintosh, MD Visit Date: 10/05/2023 Occupation: @GUAROCC @  Subjective:  No chief complaint on file.   History of Present Illness: Julia Solis is a 72 y.o. female ***     Activities of Daily Living:  Patient reports morning stiffness for *** {minute/hour:19697}.   Patient {ACTIONS;DENIES/REPORTS:21021675::"Denies"} nocturnal pain.  Difficulty dressing/grooming: {ACTIONS;DENIES/REPORTS:21021675::"Denies"} Difficulty climbing stairs: {ACTIONS;DENIES/REPORTS:21021675::"Denies"} Difficulty getting out of chair: {ACTIONS;DENIES/REPORTS:21021675::"Denies"} Difficulty using hands for taps, buttons, cutlery, and/or writing: {ACTIONS;DENIES/REPORTS:21021675::"Denies"}  No Rheumatology ROS completed.   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   Dry eye syndrome of both lacrimal glands 10/16/2015   Elevated LDL cholesterol level 02/26/2015   Back pain 02/20/2014   Insomnia 02/20/2014   Allergic rhinitis 02/20/2014   Osteopenia 07/07/2012   Fatigue 08/17/2011   Nonspecific (abnormal) findings on radiological and other examination of body structure 06/24/2010   ABNORMAL LUNG XRAY 06/24/2010   History of ovarian cancer 06/23/2010    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   Complication of anesthesia    GERD (gastroesophageal reflux disease)    Hearing aid worn     both ears   History of chemotherapy 2011   History of hiatal hernia    History of kidney stones    Hypertension    MGUS (monoclonal gammopathy of unknown significance)    dx by hematology per patient   Neuromuscular disorder (HCC)    left foot numbness since  eback surgery   Osteoporosis    PONV (postoperative nausea and vomiting)    Presence of permanent cardiac pacemaker    Sarcoidosis    in remission sees pumonary  once a year dr bellinger lov 08-21-2020 care everywhere   Sick sinus syndrome (HCC)    Wears glasses     Family History  Problem Relation Age of Onset   Hypertension Mother    Prostate cancer Father 47   Cancer Father    Hypertension Father    Stroke Father    Colon polyps Sister    Colon cancer Maternal Grandmother        dx in her 31s   Heart disease Maternal Grandfather    Colon cancer Paternal Grandmother    Heart disease Paternal Grandfather    Breast cancer Paternal Aunt 21   Colon cancer Paternal Aunt        dx in her 12s   Melanoma Cousin 21   Breast cancer Cousin 29       multiple cousins   Colon polyps Cousin    Breast cancer Cousin 27   Breast cancer Cousin        father's maternal 1st cousin   Breast cancer Cousin        Father's first cousin's daughter   Cancer Other  maternal great uncle with Cancer - NOS   Cancer Other        maternal great grandmother with either colon or stomach cancer   Breast cancer Other        fathers maternal aunt   Past Surgical History:  Procedure Laterality Date   ABDOMINAL HYSTERECTOMY  2011   APPENDECTOMY  2011    ?with hysterectomy   BACK SURGERY  1997   lumbar   CARPAL TUNNEL RELEASE Right    CESAREAN SECTION  1982, 1985   COSMETIC SURGERY  2007   brow lift   CYSTOSCOPY WITH RETROGRADE PYELOGRAM, URETEROSCOPY AND STENT PLACEMENT Left 02/04/2021   Procedure: CYSTOSCOPY WITH RETROGRADE PYELOGRAM, URETEROSCOPY AND STENT PLACEMENT;  Surgeon: Belva Agee, MD;  Location: [facility];  Service: Urology;  Laterality: Left;  1 HR; nephrolithiasis   ECTOPIC PREGNANCY SURGERY  1980   EXTRACORPOREAL SHOCK WAVE LITHOTRIPSY Left 06/23/2018   Procedure: LEFT EXTRACORPOREAL SHOCK WAVE LITHOTRIPSY (ESWL);  Surgeon: Malen Gauze, MD;  Location: [facility];  Service: Urology;  Laterality: Left;   GANGLION CYST EXCISION  1975   HOLMIUM LASER APPLICATION Left 02/04/2021   Procedure: HOLMIUM LASER APPLICATION;  Surgeon: Belva Agee, MD;  Location: [facility];  Service: Urology;  Laterality: Left;   PACEMAKER IMPLANT N/A 11/13/2020   Procedure: PACEMAKER IMPLANT;  Surgeon: Hillis Range, MD;  Location: [facility];  Service: Cardiovascular;  Laterality: N/A;   TONSILLECTOMY  1957   adenoids removed   Social History   Social History Narrative   Not on file   Immunization History  Administered Date(s) Administered   Fluad Quad(high Dose 65+) 06/22/2019, 06/15/2022   Influenza Nasal 07/06/2023   Influenza Split 07/07/2011, 07/07/2012, 07/04/2013   Influenza, High Dose Seasonal PF 06/22/2019, 07/01/2020, 06/17/2021, 12/16/2021   Influenza,inj,Quad PF,6+ Mos 07/09/2018   Influenza-Unspecified 07/05/2014, 06/26/2015, 07/05/2016, 07/29/2017   Moderna Sars-Covid-2 Vaccination 11/07/2019, 12/07/2019, 07/29/2020, 07/06/2023   PFIZER Comirnaty(Gray Top)Covid-19 Tri-Sucrose Vaccine 01/11/2021   Pfizer Covid-19 Vaccine Bivalent Booster 76yrs & up 06/17/2021, 01/27/2022   Pneumococcal Conjugate-13 02/26/2015, 04/19/2017   Pneumococcal Polysaccharide-23 10/05/2009, 07/11/2018, 12/16/2021   Respiratory Syncytial Virus Vaccine,Recomb Aduvanted(Arexvy) 07/16/2022   Tdap 06/05/2009, 03/19/2020   Unspecified SARS-COV-2 Vaccination 06/23/2022   Zoster Recombinant(Shingrix) 07/20/2023   Zoster, Live 10/05/2009     Objective: Vital Signs: There were no vitals taken for this visit.   Physical Exam   Musculoskeletal Exam: ***  CDAI Exam: CDAI Score:  -- Patient Global: --; Provider Global: -- Swollen: --; Tender: -- Joint Exam 10/05/2023   No joint exam has been documented for this visit   There is currently no information documented on the homunculus. Go to the Rheumatology activity and complete the homunculus joint exam.  Investigation: No additional findings.  Imaging: No results found.  Recent Labs: Lab Results  Component Value Date   WBC 7.6 08/20/2023   HGB 12.2 08/20/2023   PLT 220 08/20/2023   NA 142 08/20/2023   K 5.0 08/20/2023   CL 108 08/20/2023   CO2 29 08/20/2023   GLUCOSE 87 08/20/2023   BUN 16 08/20/2023   CREATININE 0.78 08/20/2023   BILITOT 0.5 08/20/2023   ALKPHOS 40 08/03/2023   AST 12 08/20/2023   ALT 6 08/20/2023   PROT 5.7 (L) 08/20/2023   ALBUMIN 3.6 08/03/2023   CALCIUM 9.2 08/20/2023   GFRAA 93 10/28/2020   QFTBGOLDPLUS NEGATIVE 07/06/2023    Speciality Comments: No specialty comments available.  Procedures:  No procedures performed Allergies: Flomax [tamsulosin hcl], Latex, Tramadol, and Ciprofloxacin   Assessment / Plan:     Visit Diagnoses: Seronegative rheumatoid arthritis (HCC)  Positive ANA (antinuclear antibody)  Current chronic use of systemic steroids  High risk medication use  Myalgia  Pain in both hands  Sarcoidosis  Dry eye syndrome of both eyes  Age-related osteoporosis without current pathological fracture  MGUS (monoclonal gammopathy of unknown significance)  Other eczema  Other insomnia  Other fatigue  Chronic midline low back pain with left-sided sciatica  Malignant neoplasm of ovary, unspecified laterality (HCC)  Pacemaker  Symptomatic sinus bradycardia  Pulmonary hypertension (HCC)  Obstructive sleep apnea  Salzmann's nodular dystrophy of both eyes  Abnormal SPEP  Orders: No orders of the defined types were placed in this encounter.  No orders of the defined types were placed in this encounter.   Face-to-face time spent with  patient was *** minutes. Greater than 50% of time was spent in counseling and coordination of care.  Follow-Up Instructions: No follow-ups on file.   Gearldine Bienenstock, PA-C  Note - This record has been created using Dragon software.  Chart creation errors have been sought, but may not always  have been located. Such creation errors do not reflect on  the standard of medical care.

## 2023-09-22 ENCOUNTER — Other Ambulatory Visit: Payer: Self-pay | Admitting: Internal Medicine

## 2023-09-22 DIAGNOSIS — Z961 Presence of intraocular lens: Secondary | ICD-10-CM | POA: Diagnosis not present

## 2023-09-22 DIAGNOSIS — Z79899 Other long term (current) drug therapy: Secondary | ICD-10-CM | POA: Diagnosis not present

## 2023-09-23 DIAGNOSIS — M25552 Pain in left hip: Secondary | ICD-10-CM | POA: Diagnosis not present

## 2023-09-25 ENCOUNTER — Encounter: Payer: Self-pay | Admitting: Rheumatology

## 2023-09-27 ENCOUNTER — Encounter (HOSPITAL_BASED_OUTPATIENT_CLINIC_OR_DEPARTMENT_OTHER): Payer: Self-pay | Admitting: Emergency Medicine

## 2023-09-27 ENCOUNTER — Other Ambulatory Visit: Payer: Self-pay

## 2023-09-27 ENCOUNTER — Emergency Department (HOSPITAL_BASED_OUTPATIENT_CLINIC_OR_DEPARTMENT_OTHER): Payer: PPO

## 2023-09-27 ENCOUNTER — Emergency Department (HOSPITAL_BASED_OUTPATIENT_CLINIC_OR_DEPARTMENT_OTHER)
Admission: EM | Admit: 2023-09-27 | Discharge: 2023-09-27 | Disposition: A | Discharge status: Home / Self Care | Payer: PPO | Attending: Emergency Medicine | Admitting: Emergency Medicine

## 2023-09-27 DIAGNOSIS — Z20822 Contact with and (suspected) exposure to covid-19: Secondary | ICD-10-CM | POA: Insufficient documentation

## 2023-09-27 DIAGNOSIS — Z79899 Other long term (current) drug therapy: Secondary | ICD-10-CM | POA: Insufficient documentation

## 2023-09-27 DIAGNOSIS — D72829 Elevated white blood cell count, unspecified: Secondary | ICD-10-CM | POA: Diagnosis not present

## 2023-09-27 DIAGNOSIS — R509 Fever, unspecified: Secondary | ICD-10-CM | POA: Insufficient documentation

## 2023-09-27 DIAGNOSIS — J029 Acute pharyngitis, unspecified: Secondary | ICD-10-CM | POA: Insufficient documentation

## 2023-09-27 DIAGNOSIS — R918 Other nonspecific abnormal finding of lung field: Secondary | ICD-10-CM | POA: Diagnosis not present

## 2023-09-27 DIAGNOSIS — I10 Essential (primary) hypertension: Secondary | ICD-10-CM | POA: Insufficient documentation

## 2023-09-27 DIAGNOSIS — Z91040 Latex allergy status: Secondary | ICD-10-CM | POA: Diagnosis not present

## 2023-09-27 DIAGNOSIS — B9789 Other viral agents as the cause of diseases classified elsewhere: Secondary | ICD-10-CM

## 2023-09-27 DIAGNOSIS — R911 Solitary pulmonary nodule: Secondary | ICD-10-CM | POA: Diagnosis not present

## 2023-09-27 DIAGNOSIS — Z950 Presence of cardiac pacemaker: Secondary | ICD-10-CM | POA: Insufficient documentation

## 2023-09-27 LAB — GROUP A STREP BY PCR: Group A Strep by PCR: NOT DETECTED

## 2023-09-27 LAB — CK: Total CK: 38 U/L (ref 38–234)

## 2023-09-27 LAB — COMPREHENSIVE METABOLIC PANEL
ALT: 7 U/L (ref 0–44)
AST: 15 U/L (ref 15–41)
Albumin: 3.5 g/dL (ref 3.5–5.0)
Alkaline Phosphatase: 57 U/L (ref 38–126)
Anion gap: 9 (ref 5–15)
BUN: 17 mg/dL (ref 8–23)
CO2: 24 mmol/L (ref 22–32)
Calcium: 8.8 mg/dL — ABNORMAL LOW (ref 8.9–10.3)
Chloride: 106 mmol/L (ref 98–111)
Creatinine, Ser: 0.8 mg/dL (ref 0.44–1.00)
GFR, Estimated: >60 mL/min (ref >60)
Glucose, Bld: 111 mg/dL — ABNORMAL HIGH (ref 70–99)
Potassium: 3.6 mmol/L (ref 3.5–5.1)
Sodium: 139 mmol/L (ref 135–145)
Total Bilirubin: 0.5 mg/dL (ref <1.2)
Total Protein: 6 g/dL — ABNORMAL LOW (ref 6.5–8.1)

## 2023-09-27 LAB — CBC WITH DIFFERENTIAL/PLATELET
Abs Immature Granulocytes: 0.07 10*3/uL (ref 0.00–0.07)
Basophils Absolute: 0 10*3/uL (ref 0.0–0.1)
Basophils Relative: 0 %
Eosinophils Absolute: 0.2 10*3/uL (ref 0.0–0.5)
Eosinophils Relative: 1 %
HCT: 37.8 % (ref 36.0–46.0)
Hemoglobin: 12.1 g/dL (ref 12.0–15.0)
Immature Granulocytes: 0 %
Lymphocytes Relative: 7 %
Lymphs Abs: 1.1 10*3/uL (ref 0.7–4.0)
MCH: 27.8 pg (ref 26.0–34.0)
MCHC: 32 g/dL (ref 30.0–36.0)
MCV: 86.7 fL (ref 80.0–100.0)
Monocytes Absolute: 0.7 10*3/uL (ref 0.1–1.0)
Monocytes Relative: 4 %
Neutro Abs: 15.2 10*3/uL — ABNORMAL HIGH (ref 1.7–7.7)
Neutrophils Relative %: 88 %
Platelets: 247 10*3/uL (ref 150–400)
RBC: 4.36 MIL/uL (ref 3.87–5.11)
RDW: 15 % (ref 11.5–15.5)
WBC: 17.3 10*3/uL — ABNORMAL HIGH (ref 4.0–10.5)
nRBC: 0 % (ref 0.0–0.2)

## 2023-09-27 LAB — RESP PANEL BY RT-PCR (RSV, FLU A&B, COVID)  RVPGX2
Influenza A by PCR: NEGATIVE
Influenza B by PCR: NEGATIVE
Resp Syncytial Virus by PCR: NEGATIVE
SARS Coronavirus 2 by RT PCR: NEGATIVE

## 2023-09-27 MED ORDER — LACTATED RINGERS IV BOLUS
1000.0000 mL | Freq: Once | INTRAVENOUS | Status: AC
  Administered 2023-09-27: 1000 mL via INTRAVENOUS

## 2023-09-27 MED ORDER — ACETAMINOPHEN 500 MG PO TABS
1000.0000 mg | ORAL_TABLET | Freq: Once | ORAL | Status: AC
  Administered 2023-09-27: 1000 mg via ORAL
  Filled 2023-09-27: qty 2

## 2023-09-27 MED ORDER — PREDNISONE 5 MG PO TABS: 10.0000 mg | ORAL_TABLET | Freq: Every day | ORAL | 0 refills | Status: DC

## 2023-09-27 NOTE — ED Notes (Signed)
ED Provider at bedside. 

## 2023-09-27 NOTE — ED Triage Notes (Signed)
Pt c/o sore throat for 4 days, fever, chills last night. Home covid test neg. Reports gen pain r/t RA, taking hydroxychlorquine

## 2023-09-27 NOTE — ED Notes (Signed)
Verbal for [facility]SE

## 2023-09-27 NOTE — Telephone Encounter (Signed)
I returned patient's call.  Patient stated that she has been having fever , sore throat and chills.  She is at the emergency room currently.  She is getting tested and will be starting medications accordingly.  I advised her to call us back when her symptoms of infection completely resolved.  I also explained to her that hydroxychloroquine should not cause increased pain.  Patient voiced understanding.

## 2023-09-27 NOTE — Discharge Instructions (Signed)
All your viral swabs and strep swab was negative.  Where white blood cell count is elevated today but the rest of the lab work was reassuring.  Blood cultures were done and if those come back positive someone will contact you.  You can continue to take Tylenol for the fever.  Make sure you are staying hydrated.  If you start having new symptoms, shortness of breath, productive cough, difficulty swallowing, repetitive vomiting or other concerns return to the emergency room.

## 2023-09-27 NOTE — ED Provider Notes (Signed)
[place] [facility]MENT AT [facility] Provider Note   CSN: [id number] Arrival date & time: 09/27/23  0751     History  Chief Complaint  Patient presents with   Sore Throat    Julia Solis is a 72 y.o. female.  Patient is a 72 year old female with a history of recently diagnosed RA who is currently weaning off of her prednisone and had recently started Plaquenil 4 weeks ago, hypertension, sick sinus syndrome status post pacemaker, sarcoidosis who is presenting today with several complaints.  Patient reports for the last 1 week she has had a sore throat, decreased appetite but then developed fever last night of up to 101.  She has not had significant cough, shortness of breath, chest pain or nasal congestion.  She reports at times it feels like there is something in her throat making it a little bit hard to swallow but denies that being continual.  She is around grandchildren and her daughter is a Chartered loss adjuster but nobody has been particularly ill.  She also reports that since starting the Plaquenil and starting to taper off of her prednisone she has been having terrible generalized body pain definitely in her upper extremities, shoulders, neck and back most significantly.  She is down to her last dose of 2 mg of prednisone today which her highest dose had been 10.  She has not had any vomiting or diarrhea but daughter has noted significant decreased oral intake.  The history is provided by the patient, a relative and medical records.  Sore Throat       Home Medications Prior to Admission medications   Medication Sig Start Date End Date Taking? Authorizing Provider  predniSONE (DELTASONE) 5 MG tablet Take 2 tablets (10 mg total) by mouth daily. Take 10mg  for the next 1 week and then start weaning down to 7.5mg  09/27/23  Yes Romona Murdy, Alphonzo Lemmings, MD  Cholecalciferol (VITAMIN D) 50 MCG (2000 UT) tablet Take 2,000 Units by mouth daily.    [provider]   famotidine (PEPCID) 20 MG tablet Take 20 mg by mouth at bedtime.    [provider]  fluticasone (FLONASE) 50 MCG/ACT nasal spray Place into the nose 3 (three) times a week.    [provider]  hydroxychloroquine (PLAQUENIL) 200 MG tablet Take 1 tablet 200 mg twice daily from Monday through Friday only. DO NOT TAKE ON SATURDAY AND SUNDAY 08/04/23   Pollyann Savoy, MD  loratadine (CLARITIN) 10 MG tablet Take 10 mg by mouth daily.    [provider]  LORazepam (ATIVAN) 1 MG tablet TAKE 1 TABLET BY MOUTH EVERY DAY AT BEDTIME AS NEEDED 06/21/23   Margaree Mackintosh, MD  losartan (COZAAR) 25 MG tablet TAKE 1 TABLET (25 MG TOTAL) BY MOUTH DAILY. 09/22/23   Margaree Mackintosh, MD  meloxicam (MOBIC) 15 MG tablet Take 1 tablet (15 mg total) by mouth as needed. PRN Patient not taking: Reported on 08/23/2023 01/24/23   Margaree Mackintosh, MD  Multiple Vitamin (MULTIVITAMIN WITH MINERALS) TABS tablet Take 1 tablet by mouth daily.    [provider]  oxyCODONE (OXY IR/ROXICODONE) 5 MG immediate release tablet Take 5 mg by mouth daily as needed. 05/05/23   [provider]  Polyethyl Glycol-Propyl Glycol (SYSTANE) 0.4-0.3 % GEL ophthalmic gel Place 1 application  into both eyes in the morning and at bedtime.    [provider]  rosuvastatin (CRESTOR) 5 MG tablet TAKE 1 TABLET BY MOUTH 3 TIMES A WEEK WITH  SUPPER 08/19/23   Margaree Mackintosh, MD      Allergies    Flomax [tamsulosin hcl], Latex, Tramadol, and Ciprofloxacin    Review of Systems   Review of Systems  Physical Exam Updated Vital Signs BP (!) 162/80   Pulse 91   Temp 99.2 F (37.3 C)   Resp 18   SpO2 99%  Physical Exam Vitals and nursing note reviewed.  Constitutional:      General: She is not in acute distress.    Appearance: She is well-developed.  HENT:     Head: Normocephalic and atraumatic.     Right Ear: Tympanic membrane normal.     Left Ear: Tympanic membrane normal.     Nose: Nose  normal.     Mouth/Throat:     Mouth: Mucous membranes are dry.     Pharynx: Posterior oropharyngeal erythema present. No oropharyngeal exudate.     Comments: No pharyngeal edema or uvular edema.  No vesicles or lesions in the throat Eyes:     Pupils: Pupils are equal, round, and reactive to light.  Neck:     Comments: No stridor or trismus Cardiovascular:     Rate and Rhythm: Normal rate and regular rhythm.     Heart sounds: Normal heart sounds. No murmur heard.    No friction rub.  Pulmonary:     Effort: Pulmonary effort is normal.     Breath sounds: Normal breath sounds. No wheezing or rales.  Abdominal:     General: Bowel sounds are normal. There is no distension.     Palpations: Abdomen is soft.     Tenderness: There is no abdominal tenderness. There is no guarding or rebound.  Musculoskeletal:        General: Normal range of motion.     Cervical back: Normal range of motion and neck supple. No tenderness.     Right lower leg: No edema.     Left lower leg: No edema.     Comments: No edema  Skin:    General: Skin is warm and dry.     Findings: No rash.     Comments: Warm to the touch  Neurological:     Mental Status: She is alert and oriented to person, place, and time.     Cranial Nerves: No cranial nerve deficit.  Psychiatric:        Behavior: Behavior normal.     ED Results / Procedures / Treatments   Labs (all labs ordered are listed, but only abnormal results are displayed) Labs Reviewed  CBC WITH DIFFERENTIAL/PLATELET - Abnormal; Notable for the following components:      Result Value   WBC 17.3 (*)    Neutro Abs 15.2 (*)    All other components within normal limits  COMPREHENSIVE METABOLIC PANEL - Abnormal; Notable for the following components:   Glucose, Bld 111 (*)    Calcium 8.8 (*)    Total Protein 6.0 (*)    All other components within normal limits  RESP PANEL BY RT-PCR (RSV, FLU A&B, COVID)  RVPGX2  GROUP A STREP BY PCR  CULTURE, BLOOD (ROUTINE X  2)  CULTURE, BLOOD (ROUTINE X 2)  CK    EKG None  Radiology DG Chest Port 1 View Result Date: 09/27/2023 CLINICAL DATA:  Fever. EXAM: PORTABLE CHEST 1 VIEW COMPARISON:  June 03, 2023. FINDINGS: Stable cardiomediastinal silhouette. Left-sided pacemaker is unchanged. Stable bilateral upper lobe opacities are noted most consistent with scarring. Small nodular density  is noted in right upper lobe. Bony thorax is unremarkable. IMPRESSION: Stable bilateral upper lobe opacities as noted above most consistent with scarring. Small nodular density is seen in right upper lobe which may represent scarring, but CT scan is recommended to rule out pulmonary nodule. Electronically Signed   By: Lupita Raider M.D.   On: 09/27/2023 08:49    Procedures Procedures    Medications Ordered in ED Medications  lactated ringers bolus 1,000 mL (1,000 mLs Intravenous New Bag/Given 09/27/23 0837)  acetaminophen (TYLENOL) tablet 1,000 mg (1,000 mg Oral Given 09/27/23 0845)    ED Course/ Medical Decision Making/ A&P                                 Medical Decision Making Amount and/or Complexity of Data Reviewed External Data Reviewed: ECG. Labs: ordered. Decision-making details documented in ED Course. Radiology: ordered and independent interpretation performed. Decision-making details documented in ED Course.  Risk OTC drugs. Prescription drug management.   Pt with multiple medical problems and comorbidities and presenting today with a complaint that caries a high risk for morbidity and mortality.  Here with the above symptoms.  Concern for viral etiology such as flu, COVID or RSV although she has had the RSV vaccination and COVID vaccinations.  Also concern for possible strep pharyngitis but no findings to suggest epiglottitis, retropharyngeal abscess or PTA based on examination.  She has no stridor or trismus.  Also concern for possible pneumonia.  Also given her recent decline in prednisone,  Plaquenil initiation and generalized bodyaches concern for rhabdomyolysis, fever also could be related to a rheumatologic illness.  Patient has a temp of 99.2 orally here and feels very warm.  She had an ibuprofen at 3 AM this morning was given a dose of Tylenol here.  Labs and imaging are pending.  9:55 AM I independently interpreted patient's labs and CBC with leukocytosis of 17 today with normal hemoglobin and platelet count, CMP without acute findings, CK normal, viral swabs and strep screen are negative.  I have independently visualized and interpreted pt's images today.  Chest x-ray without acute findings.  On reevaluation after fluids and Tylenol patient's vital signs are normal.  She is well-appearing at this time.  Unclear the source of her fever but no evidence of sepsis at this time.  Again no concern for deep throat infection.  Discussed all these findings with the patient.  Will do blood cultures but will hold on antibiotics due to not having a specific source.  Still considered this could be viral in nature.  Discussed all this with the patient and her family member.  At this time we will discharge her home to continue antipyretics.  Also will have her go back up on her prednisone due to the terrible ache and pain in her hands with swelling.  She was given return precautions.  They are comfortable with this plan.          Final Clinical Impression(s) / ED Diagnoses Final diagnoses:  Fever, unspecified fever cause  Sore throat (viral)  Leukocytosis, unspecified type    Rx / DC Orders ED Discharge Orders          Ordered    predniSONE (DELTASONE) 5 MG tablet  Daily        09/27/23 0954              Gwyneth Sprout, MD 09/27/23 [phone]

## 2023-09-30 ENCOUNTER — Other Ambulatory Visit: Payer: Self-pay | Admitting: *Deleted

## 2023-09-30 DIAGNOSIS — Z79899 Other long term (current) drug therapy: Secondary | ICD-10-CM

## 2023-10-01 LAB — COMPLETE METABOLIC PANEL WITH GFR
AG Ratio: 1.5 (calc) (ref 1.0–2.5)
ALT: 11 U/L (ref 6–29)
AST: 17 U/L (ref 10–35)
Albumin: 3.2 g/dL — ABNORMAL LOW (ref 3.6–5.1)
Alkaline phosphatase (APISO): 53 U/L (ref 37–153)
BUN: 22 mg/dL (ref 7–25)
CO2: 26 mmol/L (ref 20–32)
Calcium: 8.6 mg/dL (ref 8.6–10.4)
Chloride: 109 mmol/L (ref 98–110)
Creat: 0.77 mg/dL (ref 0.60–1.00)
Globulin: 2.1 g/dL (ref 1.9–3.7)
Glucose, Bld: 82 mg/dL (ref 65–99)
Potassium: 4.1 mmol/L (ref 3.5–5.3)
Sodium: 142 mmol/L (ref 135–146)
Total Bilirubin: 0.2 mg/dL (ref 0.2–1.2)
Total Protein: 5.3 g/dL — ABNORMAL LOW (ref 6.1–8.1)
eGFR: 82 mL/min/{1.73_m2} (ref >=60)

## 2023-10-01 LAB — CBC WITH DIFFERENTIAL/PLATELET
Absolute Lymphocytes: 1980 {cells}/uL (ref 850–3900)
Absolute Monocytes: 757 {cells}/uL (ref 200–950)
Basophils Absolute: 26 {cells}/uL (ref 0–200)
Basophils Relative: 0.3 %
Eosinophils Absolute: 238 {cells}/uL (ref 15–500)
Eosinophils Relative: 2.7 %
HCT: 34.4 % — ABNORMAL LOW (ref 35.0–45.0)
Hemoglobin: 10.7 g/dL — ABNORMAL LOW (ref 11.7–15.5)
MCH: 27.2 pg (ref 27.0–33.0)
MCHC: 31.1 g/dL — ABNORMAL LOW (ref 32.0–36.0)
MCV: 87.3 fL (ref 80.0–100.0)
MPV: 9.9 fL (ref 7.5–12.5)
Monocytes Relative: 8.6 %
Neutro Abs: 5799 {cells}/uL (ref 1500–7800)
Neutrophils Relative %: 65.9 %
Platelets: 296 10*3/uL (ref 140–400)
RBC: 3.94 10*6/uL (ref 3.80–5.10)
RDW: 13.7 % (ref 11.0–15.0)
Total Lymphocyte: 22.5 %
WBC: 8.8 10*3/uL (ref 3.8–10.8)

## 2023-10-01 NOTE — Progress Notes (Signed)
Hemoglobin is low.  Patient should stop meloxicam and other NSAIDs as these medications can cause chronic blood loss.  She should also be evaluated by her PCP for the evaluation of low hemoglobin.  Please forward results to her PCP.

## 2023-10-02 DIAGNOSIS — R051 Acute cough: Secondary | ICD-10-CM | POA: Diagnosis not present

## 2023-10-02 DIAGNOSIS — Z20822 Contact with and (suspected) exposure to covid-19: Secondary | ICD-10-CM | POA: Diagnosis not present

## 2023-10-02 DIAGNOSIS — J101 Influenza due to other identified influenza virus with other respiratory manifestations: Secondary | ICD-10-CM | POA: Diagnosis not present

## 2023-10-02 LAB — CULTURE, BLOOD (ROUTINE X 2)
Culture: NO GROWTH
Culture: NO GROWTH
Special Requests: ADEQUATE
Special Requests: ADEQUATE

## 2023-10-04 ENCOUNTER — Other Ambulatory Visit: Payer: PPO

## 2023-10-05 ENCOUNTER — Ambulatory Visit: Payer: PPO | Admitting: Physician Assistant

## 2023-10-05 ENCOUNTER — Ambulatory Visit: Payer: Self-pay

## 2023-10-05 DIAGNOSIS — J470 Bronchiectasis with acute lower respiratory infection: Secondary | ICD-10-CM | POA: Diagnosis not present

## 2023-10-05 DIAGNOSIS — R778 Other specified abnormalities of plasma proteins: Secondary | ICD-10-CM

## 2023-10-05 DIAGNOSIS — L308 Other specified dermatitis: Secondary | ICD-10-CM

## 2023-10-05 DIAGNOSIS — Z7952 Long term (current) use of systemic steroids: Secondary | ICD-10-CM

## 2023-10-05 DIAGNOSIS — I272 Pulmonary hypertension, unspecified: Secondary | ICD-10-CM

## 2023-10-05 DIAGNOSIS — R5383 Other fatigue: Secondary | ICD-10-CM

## 2023-10-05 DIAGNOSIS — G8929 Other chronic pain: Secondary | ICD-10-CM

## 2023-10-05 DIAGNOSIS — Z79899 Other long term (current) drug therapy: Secondary | ICD-10-CM

## 2023-10-05 DIAGNOSIS — M791 Myalgia, unspecified site: Secondary | ICD-10-CM

## 2023-10-05 DIAGNOSIS — M79641 Pain in right hand: Secondary | ICD-10-CM

## 2023-10-05 DIAGNOSIS — H04123 Dry eye syndrome of bilateral lacrimal glands: Secondary | ICD-10-CM

## 2023-10-05 DIAGNOSIS — C569 Malignant neoplasm of unspecified ovary: Secondary | ICD-10-CM

## 2023-10-05 DIAGNOSIS — R001 Bradycardia, unspecified: Secondary | ICD-10-CM

## 2023-10-05 DIAGNOSIS — Z950 Presence of cardiac pacemaker: Secondary | ICD-10-CM

## 2023-10-05 DIAGNOSIS — G4709 Other insomnia: Secondary | ICD-10-CM

## 2023-10-05 DIAGNOSIS — D869 Sarcoidosis, unspecified: Secondary | ICD-10-CM

## 2023-10-05 DIAGNOSIS — D472 Monoclonal gammopathy: Secondary | ICD-10-CM

## 2023-10-05 DIAGNOSIS — R0602 Shortness of breath: Secondary | ICD-10-CM | POA: Diagnosis not present

## 2023-10-05 DIAGNOSIS — H18453 Nodular corneal degeneration, bilateral: Secondary | ICD-10-CM

## 2023-10-05 DIAGNOSIS — M810 Age-related osteoporosis without current pathological fracture: Secondary | ICD-10-CM

## 2023-10-05 DIAGNOSIS — R768 Other specified abnormal immunological findings in serum: Secondary | ICD-10-CM

## 2023-10-05 DIAGNOSIS — R051 Acute cough: Secondary | ICD-10-CM | POA: Diagnosis not present

## 2023-10-05 DIAGNOSIS — M0600 Rheumatoid arthritis without rheumatoid factor, unspecified site: Secondary | ICD-10-CM

## 2023-10-05 DIAGNOSIS — G4733 Obstructive sleep apnea (adult) (pediatric): Secondary | ICD-10-CM

## 2023-10-05 NOTE — Telephone Encounter (Signed)
     Chief Complaint: Cough, wheezing is new. Seen in [facility] 10/02/23. Sob with exertion. Symptoms: Above, cannot reach PCP. Frequency: Last week Pertinent Negatives: Patient denies fever Disposition: [] ED /[x] Urgent Care (no appt availability in office) / [] Appointment(In office/virtual)/ []  [facility]/ [] Home Care/ [] Refused Recommended Disposition /[] [facility]/ []  Follow-up with PCP Additional Notes: Daughter agrees with UC.  Reason for Disposition  [1] MILD difficulty breathing (e.g., minimal/no SOB at rest, SOB with walking, pulse <100) AND [2] still present when not coughing  Answer Assessment - Initial Assessment Questions 1. ONSET: When did the cough begin?      Last Monday 2. SEVERITY: How bad is the cough today?      Severe 3. SPUTUM: Describe the color of your sputum (none, dry cough; clear, white, yellow, green)     None 4. HEMOPTYSIS: Are you coughing up any blood? If so ask: How much? (flecks, streaks, tablespoons, etc.)     No 5. DIFFICULTY BREATHING: Are you having difficulty breathing? If Yes, ask: How bad is it? (e.g., mild, moderate, severe)    - MILD: No SOB at rest, mild SOB with walking, speaks normally in sentences, can lie down, no retractions, pulse < 100.    - MODERATE: SOB at rest, SOB with minimal exertion and prefers to sit, cannot lie down flat, speaks in phrases, mild retractions, audible wheezing, pulse 100-120.    - SEVERE: Very SOB at rest, speaks in single words, struggling to breathe, sitting hunched forward, retractions, pulse > 120      Mild 6. FEVER: Do you have a fever? If Yes, ask: What is your temperature, how was it measured, and when did it start?     99.4 7. CARDIAC HISTORY: Do you have any history of heart disease? (e.g., heart attack, congestive heart failure)      No 8. LUNG HISTORY: Do you have any history of lung disease?  (e.g., pulmonary embolus, asthma, emphysema)     Sarcoid 9. PE RISK  FACTORS: Do you have a history of blood clots? (or: recent major surgery, recent prolonged travel, bedridden)     No 10. OTHER SYMPTOMS: Do you have any other symptoms? (e.g., runny nose, wheezing, chest pain)       Wheezing, SOB, positive flu 11. PREGNANCY: Is there any chance you are pregnant? When was your last menstrual period?       No 12. TRAVEL: Have you traveled out of the country in the last month? (e.g., travel history, exposures)       No  Protocols used: Cough - Acute Non-Productive-A-AH

## 2023-10-07 ENCOUNTER — Ambulatory Visit: Payer: PPO | Admitting: Internal Medicine

## 2023-10-07 ENCOUNTER — Telehealth: Payer: Self-pay | Admitting: Internal Medicine

## 2023-10-07 NOTE — Telephone Encounter (Signed)
 Do we need to schedule office visit today?

## 2023-10-07 NOTE — Telephone Encounter (Signed)
 Called and spoke with Cortney on Saturday 10/02/2023 she was diagnosed with the positive flu A at the urgent care - she was given below medications  benzonatate  (TESSALON ) 100 mg capsule  Indications: Acute cough Take 1 capsule (100 mg total) by mouth 3 (three) times a day as needed for cough. 30 capsule      10/02/2023 10/05/2023  dexAMETHasone  (DECADRON ) 6 mg tablet  Indications: Acute cough Take 1 tablet (6 mg total) by mouth daily with breakfast for 5 days. 5 tablet      10/02/2023 10/05/19    Went back to Urgent Care on 10/05/2023 - symptoms getting worse.  Done chest Xray  IMPRESSION: There is no evidence of acute cardiac or pulmonary abnormality. Similar bronchiectasis and upper lobe predominant reticular opacities and/or scarring, favoring chronic changes of sarcoidosis. Diagnosis  Bronchiectasis with acute lower respiratory infection (CMD) - Primary   Shortness of breath   Acute cough   Told to  Discontinued Medications - documented as of this encounter  Discontinued Medications Medication Sig Discontinue Reason Start Date End Date  dexAMETHasone  (DECADRON ) 6 mg tablet  Indications: Acute cough Take 1 tablet (6 mg total) by mouth daily with breakfast for 5 days.   10/02/2023 10/05/2023  predniSONE  (DELTASONE ) 5 mg tablet  TAKE 2 TABLETS BY MOUTH FOR 1 WEEK AND THEN START WEANING DOWN TO 1 AND 1/2 TABLETS BY MOUTH   09/27/2023 10/05/2023  benzonatate  (TESSALON ) 100 mg capsule  Indications: Acute cough Take 1 capsule (100 mg total) by mouth 3 (three) times a day as needed for cough.   10/02/2023 10/05/2023  Administered Medications - documented in this encounter  Administered Medications - Inactive Administered Medications - up to 3 most recent administrations Inactive Administered Medications - up to 3 most recent administrations Medication Order [facility] Action Action Date Dose Rate Site  ipratropium-albuteroL  (DUO-NEB) 0.5-2.5 mg/3 mL nebulizer solution 3 mL  3 mL, nebulization,  Once, On Tue 10/05/23 at 1645, For 1 dose  Indications: Shortness of breath Given 10/05/2023 4:36 PM EST 3 mL           methylPREDNISolone  sodium succinate (SOLU-Medrol ) injection 125 mg  125 mg, intramuscular, Once, On Tue 10/05/23 at 1645, For 1 dose, IV Push doses (LESS THAN 250 mg) are given over several minutes; max rate 50 mg/min Reconstitute powder with bacteriostatic water OR sterile water. If reconstituted with bacteriostatic water, product will contain preservatives.  Indications: Shortness of breath Given 10/05/2023 4:36 PM EST 125 mg   Left Ventrogluteal   Orders - documented in this encounter  Orders Medications Ordered That Might Not Have Been Administered Count Last Ordered Date First Ordered Date  cefTRIAXone  (ROCEPHIN ) injection 500 mg 1 10/05/2023     Ordered new medications. Ordered Prescriptions - documented in this encounter  Ordered Prescriptions Prescription Sig Dispense Quantity Refills Last Filled Start Date End Date  promethazine -dextromethorphan (PHENERGAN  DM) 6.25-15 mg/5 mL syrp syrup  Indications: Acute cough 1-2 teaspoons every 6 hours as needed for cough mainly at bedtime. Do not take and drive. 180 mL      10/05/2023    sodium chloride  0.9 % nebulizer solution  Indications: Shortness of breath, Bronchiectasis with acute lower respiratory infection (CMD), Acute cough Take 3 mL by nebulization as needed for wheezing or shortness of breath. 30 mL      10/05/2023 10/19/2023  amoxicillin -pot clavulanate (Augmentin ) 875-125 mg per tablet  Indications: Bronchiectasis with acute lower respiratory infection (CMD) Take 1 tablet by mouth 2 (two) times a  day for 10 days. 20 tablet      10/05/2023 10/15/2023  predniSONE  (DELTASONE ) 20 mg tablet  Indications: Shortness of breath, Bronchiectasis with acute lower respiratory infection (CMD), Acute cough Take 3 pills for 3 days, 2 pills for 3 days, 1 pill for 3 days, 1/2 pill for 3 days. Do not take with NSAIDs. Take all  pills for that day in the morning with food 21 tablet      10/05/2023    ipratropium-albuteroL  (DUO-NEB) 0.5-2.5 mg/3 mL nebulizer solution  Indications: Shortness of breath, Bronchiectasis with acute lower respiratory infection (CMD), Acute cough Take 3 mL by nebulization every 6 (six) hours. 1080 mL  3   10/05/2023 10/04/2024

## 2023-10-07 NOTE — Telephone Encounter (Signed)
 Copied from CRM [id number]. Topic: Clinical - Medical Advice >> Oct 05, 2023 10:09 AM Carlatta H wrote: Reason for CRM: Patient went to urgent care 2days ago, the prescribe her Dexamethasone  and its making her feel worse//Patient is also taking Mucinex and Perles//

## 2023-10-07 NOTE — Telephone Encounter (Signed)
 Copied from CRM [id number]. Topic: Clinical - Medical Advice >> Oct 07, 2023 12:08 PM Julia Solis wrote: Reason for CRM: Patient went to urgent care 2days ago, was prescribed Dexamethasone  and its making her feel worse//Patient is also taking Mucinex and Perle, Requesting call back [phone].

## 2023-10-07 NOTE — Telephone Encounter (Unsigned)
 Copied from CRM [id number]. Topic: Clinical - Medical Advice >> Oct 07, 2023 12:08 PM Ivette P wrote: Reason for CRM: Patient went to urgent care 2days ago, was prescribed Dexamethasone  and its making her feel worse//Patient is also taking Mucinex and Perle, Requesting call back [phone].

## 2023-10-08 ENCOUNTER — Ambulatory Visit (INDEPENDENT_AMBULATORY_CARE_PROVIDER_SITE_OTHER): Payer: PPO | Admitting: Internal Medicine

## 2023-10-08 ENCOUNTER — Encounter: Payer: Self-pay | Admitting: Internal Medicine

## 2023-10-08 VITALS — BP 120/80 | HR 79 | Temp 98.6°F | Ht 64.5 in | Wt 172.0 lb

## 2023-10-08 DIAGNOSIS — R5381 Other malaise: Secondary | ICD-10-CM | POA: Diagnosis not present

## 2023-10-08 DIAGNOSIS — D860 Sarcoidosis of lung: Secondary | ICD-10-CM

## 2023-10-08 DIAGNOSIS — M0600 Rheumatoid arthritis without rheumatoid factor, unspecified site: Secondary | ICD-10-CM

## 2023-10-08 DIAGNOSIS — R059 Cough, unspecified: Secondary | ICD-10-CM

## 2023-10-08 DIAGNOSIS — R531 Weakness: Secondary | ICD-10-CM

## 2023-10-08 DIAGNOSIS — Z8679 Personal history of other diseases of the circulatory system: Secondary | ICD-10-CM | POA: Diagnosis not present

## 2023-10-08 DIAGNOSIS — Z8543 Personal history of malignant neoplasm of ovary: Secondary | ICD-10-CM

## 2023-10-08 DIAGNOSIS — Z950 Presence of cardiac pacemaker: Secondary | ICD-10-CM

## 2023-10-08 DIAGNOSIS — J111 Influenza due to unidentified influenza virus with other respiratory manifestations: Secondary | ICD-10-CM

## 2023-10-08 DIAGNOSIS — R5383 Other fatigue: Secondary | ICD-10-CM | POA: Diagnosis not present

## 2023-10-08 DIAGNOSIS — M810 Age-related osteoporosis without current pathological fracture: Secondary | ICD-10-CM | POA: Diagnosis not present

## 2023-10-08 DIAGNOSIS — H905 Unspecified sensorineural hearing loss: Secondary | ICD-10-CM | POA: Diagnosis not present

## 2023-10-08 MED ORDER — CEFTRIAXONE SODIUM 1 G IJ SOLR
1.0000 g | Freq: Once | INTRAMUSCULAR | Status: AC
  Administered 2023-10-08: 1 g via INTRAMUSCULAR

## 2023-10-08 NOTE — Progress Notes (Signed)
 Patient Care Team: Perri Ronal PARAS, MD as PCP - General (Internal Medicine) Mealor, Eulas BRAVO, MD as Consulting Physician (Cardiology) Dolphus Reiter, MD as Consulting Physician (Rheumatology)  Visit Date: 10/08/23  Subjective:   Chief Complaint  Patient presents with   Influenza    Tested positive for the flu on 10/02/23. Patient c/o cough, congestion and sob.    Patient PI:Julia Solis,Female DOB:1950/12/13,72 y.o.FMW:[id number]   74 y.o. Female presents today for acute sick visit with hx of influenza dx Dec.28 and hx of ovarian cancer,MGUS,pulmonary sarcoidosis,seronegative RA.  12/23, she was seen by ED at [facility] for fever. Was given Tylenol  to reduce her fever and Prednisone  for her body aches.   UC visit on 10/02/23 for fever, tested POS for flu, and given 6 mg Dexamethasone  and 100 mg Tessalon  Perles for her symptoms.   Was seen in [facility] 12/31 for SOB and was given 875-125 mg Augmentin , sodium-chloride and ipratropium-albuterol  nebulizer solution, 6.25-15 Phenergan  DM, and 20 mg Prednisone .   Today she reports that she does not feel improved, though she is coughing less. Endorses hoarseness, SOB, ear congestion, and watery eyes  She notes that she was supposed to stop taking steroids to evaluate her RA. Has an appointment scheduled   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   Complication of anesthesia    GERD (gastroesophageal reflux disease)    Hearing aid worn    both ears   History of chemotherapy 2011   History of hiatal hernia    History of kidney stones    Hypertension    MGUS (monoclonal gammopathy of unknown significance)    dx by hematology per patient   Neuromuscular disorder (HCC)    left foot numbness since  eback surgery   Osteoporosis    PONV (postoperative nausea and vomiting)    Presence of permanent cardiac pacemaker    Sarcoidosis    in remission sees pumonary  once a year dr bellinger lov  08-21-2020 care everywhere   Sick sinus syndrome (HCC)    Wears glasses     Family History  Problem Relation Age of Onset   Hypertension Mother    Prostate cancer Father 46   Cancer Father    Hypertension Father    Stroke Father    Colon polyps Sister    Colon cancer Maternal Grandmother        dx in her 48s   Heart disease Maternal Grandfather    Colon cancer Paternal Grandmother    Heart disease Paternal Grandfather    Breast cancer Paternal Aunt 44   Colon cancer Paternal Aunt        dx in her 70s   Melanoma Cousin 39   Breast cancer Cousin 73       multiple cousins   Colon polyps Cousin    Breast cancer Cousin 58   Breast cancer Cousin        father's maternal 1st cousin   Breast cancer Cousin        Father's first cousin's daughter   Cancer Other        maternal great uncle with Cancer - NOS   Cancer Other        maternal great grandmother with either colon or stomach cancer   Breast cancer Other        fathers maternal aunt   Social History   Social History Narrative   Not on file  ROS   Objective:  Vitals: BP 120/80   Pulse 79   Temp 98.6 F (37 C)   Ht 5' 4.5 (1.638 m)   Wt 172 lb (78 kg)   SpO2 97%   BMI 29.07 kg/m  Physical Exam HENT:     Right Ear: Tympanic membrane, ear canal and external ear normal.     Left Ear: Tympanic membrane, ear canal and external ear normal.     Mouth/Throat:     Pharynx: Oropharynx is clear.  Pulmonary:     Breath sounds: Normal breath sounds.     Results:  Studies obtained and personally reviewed by me:  XR CHEST 2 VIEWS, 10/05/2023 4:24 PM COMPARISON: 09/2623  Cardiovascular: Cardiac silhouette and pulmonary vasculature are within normal limits. Dual-lead left subclavian approach cardiac rhythm maintenance device with similar lead positioning.  Mediastinum: Within normal limits.  Lungs/pleura: Similar chronic bronchiectasis and thin reticular opacities with upper lobe predominance. No new pulmonary  consolidation. No pleural effusion or pneumothorax.  Upper abdomen: Visualized portions are unremarkable.  Chest wall/osseous structures: No acute fracture. Polyarticular degenerative changes.    Labs:     Component Value Date/Time   NA 142 09/30/2023 0935   NA 144 10/28/2020 1636   K 4.1 09/30/2023 0935   CL 109 09/30/2023 0935   CO2 26 09/30/2023 0935   GLUCOSE 82 09/30/2023 0935   BUN 22 09/30/2023 0935   BUN 16 10/28/2020 1636   CREATININE 0.77 09/30/2023 0935   CALCIUM  8.6 09/30/2023 0935   PROT 5.3 (L) 09/30/2023 0935   ALBUMIN 3.5 09/27/2023 0822   AST 17 09/30/2023 0935   ALT 11 09/30/2023 0935   ALKPHOS 57 09/27/2023 0822   BILITOT 0.2 09/30/2023 0935   GFRNONAA >60 09/27/2023 0822   GFRNONAA 82 07/11/2020 0949   GFRAA 93 10/28/2020 1636   GFRAA 95 07/11/2020 0949    Lab Results  Component Value Date   WBC 8.8 09/30/2023   HGB 10.7 (L) 09/30/2023   HCT 34.4 (L) 09/30/2023   MCV 87.3 09/30/2023   PLT 296 09/30/2023   Lab Results  Component Value Date   CHOL 210 (H) 08/20/2023   HDL 73 08/20/2023   LDLCALC 117 (H) 08/20/2023   TRIG 95 08/20/2023   CHOLHDL 2.9 08/20/2023   Lab Results  Component Value Date   HGBA1C 5.4 08/20/2023    Lab Results  Component Value Date   TSH 3.04 08/20/2023   Assessment & Plan:   Influenza: Received 1 g Rocephin . Ordering labs (CBC and CMP w/GRF). Continue your Augmentin , stay well hydrated, well rested, and well nourished; let us  know if symptoms persist/worsen despite treatment!  Sarcoidosis and RA seem stable. Pt appears to have malaise and fatigue mostly from the flu and is slow to recover.  I,Emily Lagle,acting as a neurosurgeon for Ronal JINNY Hailstone, MD.,have documented all relevant documentation on the behalf of Ronal JINNY Hailstone, MD,as directed by  Ronal JINNY Hailstone, MD while in the presence of Ronal JINNY Hailstone, MD.   I, Ronal JINNY Hailstone, MD, have reviewed all documentation for this visit. The documentation on 10/17/23 for the exam,  diagnosis, procedures, and orders are all accurate and complete.

## 2023-10-09 LAB — CBC WITH DIFFERENTIAL/PLATELET
Absolute Lymphocytes: 1345 {cells}/uL (ref 850–3900)
Absolute Monocytes: 820 {cells}/uL (ref 200–950)
Basophils Absolute: 33 {cells}/uL (ref 0–200)
Basophils Relative: 0.2 %
Eosinophils Absolute: 16 {cells}/uL (ref 15–500)
Eosinophils Relative: 0.1 %
HCT: 37.3 % (ref 35.0–45.0)
Hemoglobin: 11.8 g/dL (ref 11.7–15.5)
MCH: 27.6 pg (ref 27.0–33.0)
MCHC: 31.6 g/dL — ABNORMAL LOW (ref 32.0–36.0)
MCV: 87.4 fL (ref 80.0–100.0)
MPV: 10.2 fL (ref 7.5–12.5)
Monocytes Relative: 5 %
Neutro Abs: 14186 {cells}/uL — ABNORMAL HIGH (ref 1500–7800)
Neutrophils Relative %: 86.5 %
Platelets: 347 10*3/uL (ref 140–400)
RBC: 4.27 10*6/uL (ref 3.80–5.10)
RDW: 14.4 % (ref 11.0–15.0)
Total Lymphocyte: 8.2 %
WBC: 16.4 10*3/uL — ABNORMAL HIGH (ref 3.8–10.8)

## 2023-10-09 LAB — COMPLETE METABOLIC PANEL WITH GFR
AG Ratio: 1.2 (calc) (ref 1.0–2.5)
ALT: 24 U/L (ref 6–29)
AST: 29 U/L (ref 10–35)
Albumin: 3 g/dL — ABNORMAL LOW (ref 3.6–5.1)
Alkaline phosphatase (APISO): 60 U/L (ref 37–153)
BUN/Creatinine Ratio: 32 (calc) — ABNORMAL HIGH (ref 6–22)
BUN: 26 mg/dL — ABNORMAL HIGH (ref 7–25)
CO2: 26 mmol/L (ref 20–32)
Calcium: 8.8 mg/dL (ref 8.6–10.4)
Chloride: 105 mmol/L (ref 98–110)
Creat: 0.82 mg/dL (ref 0.60–1.00)
Globulin: 2.5 g/dL (ref 1.9–3.7)
Glucose, Bld: 80 mg/dL (ref 65–99)
Potassium: 4.7 mmol/L (ref 3.5–5.3)
Sodium: 142 mmol/L (ref 135–146)
Total Bilirubin: 0.2 mg/dL (ref 0.2–1.2)
Total Protein: 5.5 g/dL — ABNORMAL LOW (ref 6.1–8.1)
eGFR: 76 mL/min/{1.73_m2} (ref >=60)

## 2023-10-11 ENCOUNTER — Ambulatory Visit: Payer: Self-pay | Admitting: Internal Medicine

## 2023-10-11 ENCOUNTER — Other Ambulatory Visit: Payer: PPO

## 2023-10-11 NOTE — Telephone Encounter (Signed)
 Chief Complaint: Flu Update & Diarrhea Symptoms: Diarrhea, flu symptoms continuing Frequency: intermittent since last night Pertinent Negatives: Patient denies fever, abdominal pain Disposition: [] ED /[] Urgent Care (no appt availability in office) / [] Appointment(In office/virtual)/ []  [facility]/ [] Home Care/ [] Refused Recommended Disposition /[] [facility]/ [x]  Follow-up with PCP Additional Notes: Patient called in stating she was told at her appointment last week to call on Monday (10/11/23) to update Dr. Perri on her condition/improvement with the flu. Patient states her flu-like symptoms are very slowly improving but she is now experiencing diarrhea since last night (10/10/23). Patient states she received a Rocephin  shot in the office on 10/08/23 and is on Augmentin  875-125. Patient states she had been putting honey in her tea to help out with the cough and believes that could potentially be the cause of her diarrhea. Patient denies fever, mucous or foul smelling diarrhea, and denies abdominal pain. Patient states she believes she is still hydrated due to her urinating frequently with increased water intake. Patient is asking if she should continue abx. This RN advised patient to continue abx at this time and will consult with Dr. Perri about if any changes should be made. Advised patient she will receive a call if any changes are to be made or patient needs to be seen in office again. Given home care instructions on hydration and diarrhea and advised patient to call back if symptoms worsen.   Copied from CRM [id number]. Topic: Clinical - Medical Advice >> Oct 11, 2023  9:47 AM Carlatta H wrote: Reason for CRM: Patient was told by Dr Perri to call and report condition on today//She is coughing less but now has diarrhea 24 hou//Transferring to nurse triage Reason for Disposition  Patient wants to stop the antibiotic  Answer Assessment - Initial Assessment Questions 1.  ANTIBIOTIC: What antibiotic are you taking? How many times per day?     Augmentin  2. ANTIBIOTIC ONSET: When was the antibiotic started?     Last Tuesday night (Dec 31)/ received Rocephin  shot in office on Friday 10/08/23 3. DIARRHEA SEVERITY: How bad is the diarrhea? How many more stools have you had in the past 24 hours than normal?    - NO DIARRHEA (SCALE 0)   - MILD (SCALE 1-3): Few loose or mushy BMs; increase of 1-3 stools over normal daily number of stools; mild increase in ostomy output.   -  MODERATE (SCALE 4-7): Increase of 4-6 stools daily over normal; moderate increase in ostomy output. * SEVERE (SCALE 8-10; OR 'WORST POSSIBLE'): Increase of 7 or more stools daily over normal; moderate increase in ostomy output; incontinence.     Completely loose watery stool 4. ONSET: When did the diarrhea begin?      10/10/23 - yesterday 5. BM CONSISTENCY: How loose or watery is the diarrhea?      Very watery, brown 6. VOMITING: Are you also vomiting? If Yes, ask: How many times in the past 24 hours?      No 7. ABDOMEN PAIN: Are you having any abdomen pain? If Yes, ask: What does it feel like? (e.g., crampy, dull, intermittent, constant)      No 8. ABDOMEN PAIN SEVERITY: If present, ask: How bad is the pain?  (e.g., Scale 1-10; mild, moderate, or severe)   - MILD (1-3): doesn't interfere with normal activities, abdomen soft and not tender to touch    - MODERATE (4-7): interferes with normal activities or awakens from sleep, abdomen tender to touch    -  SEVERE (8-10): excruciating pain, doubled over, unable to do any normal activities       No pain 9. ORAL INTAKE: If vomiting, Have you been able to drink liquids? How much liquids have you had in the past 24 hours?     Yes been able to drink and hold in liquids 10. HYDRATION: Any signs of dehydration? (e.g., dry mouth [not just dry lips], too weak to stand, dizziness, new weight loss) When did you last urinate?        Hydration status feels fine, I am drinking a lot and peeing a lot 12. OTHER SYMPTOMS: Do you have any other symptoms? (e.g., fever, blood in stool)       No fever, no blood  Protocols used: Diarrhea on Antibiotics-A-AH

## 2023-10-11 NOTE — Progress Notes (Unsigned)
Office Visit Note  Patient: Julia Solis             Date of Birth: 28-Jun-1951           MRN: [id number]             PCP: Margaree Mackintosh, MD Referring: Margaree Mackintosh, MD Visit Date: 10/25/2023 Occupation: @GUAROCC @  Subjective:  Fatigue   History of Present Illness: Julia Solis is a 73 y.o. female with history of seronegative rheumatoid arthritis.  Patient is currently taking Plaquenil 200 mg p.o. twice daily Monday to Friday.  Patient was started on Plaquenil after her last office visit on 08/04/2023.  She has been tolerating Plaquenil without any side effects.  According to the patient she has had several new concerns since her last office visit.  Most recently she developed a fever on 09/27/23 patient was tested for strep and had a blood culture performed which was negative.  Patient developed more flulike symptoms starting on 09/30/2023 prompting further evaluation on 10/02/2023 at which time she tested positive for influenza A.  Patient reports that she required several urgent care visits as well as was evaluated by her PCP.  Patient states that her symptoms lasted for about 3 weeks.  Patient states that she was on and off steroids during that time as well as was also given a course of Augmentin and was using a nebulizer.  Patient reports that she developed significant diarrhea while taking Augmentin.  Patient states she continues to have significant fatigue which she attributes to the recent infection and diarrhea.  Patient continues to have some residual congestion and a mild cough.  Patient states last Wednesday she had a recurrence of a fever but at that time her white blood cell count was normal.  She has been off of prednisone and she has started to have a mild recurrence of pain in her joints.  She currently rates the pain a 3 out of 10 in her hands.  She is also had some increased pain in her upper arms and back.  She is willing to give Plaquenil more time and for [place] to  reassess for the full efficacy at her follow-up visit. 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    Activities of Daily Living:  Patient reports morning stiffness for several hours.   Patient Reports nocturnal pain.  Difficulty dressing/grooming: Denies Difficulty climbing stairs: Reports Difficulty getting out of chair: Reports Difficulty using hands for taps, buttons, cutlery, and/or writing: Reports  Review of Systems  Constitutional:  Positive for fatigue and fever.  HENT:  Positive for nose dryness. Negative for mouth sores and mouth dryness.   Eyes:  Positive for dryness. Negative for pain.  Respiratory:  Positive for cough and shortness of breath. Negative for difficulty breathing.   Cardiovascular:  Negative for chest pain and palpitations.  Gastrointestinal:  Negative for blood in stool, constipation and diarrhea.  Endocrine: Negative for increased urination.  Genitourinary:  Negative for involuntary urination.  Musculoskeletal:  Positive for joint pain, gait problem, joint pain, joint swelling, myalgias, muscle weakness, morning stiffness, muscle tenderness and myalgias.  Skin:  Negative for color change, rash, hair loss and sensitivity to sunlight.  Allergic/Immunologic: Positive for susceptible to infections.  Neurological:  Positive for weakness. Negative for dizziness and headaches.  Hematological:  Negative for swollen glands.  Psychiatric/Behavioral:  Negative for depressed mood and sleep disturbance. The patient is not  nervous/anxious.     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   Dry eye syndrome of both lacrimal glands 10/16/2015   Elevated LDL cholesterol level 02/26/2015   Back pain 02/20/2014   Insomnia 02/20/2014   Allergic rhinitis 02/20/2014   Osteopenia 07/07/2012   Fatigue 08/17/2011   Nonspecific (abnormal) findings on radiological and other examination of body structure 06/24/2010   ABNORMAL LUNG XRAY 06/24/2010   History of ovarian cancer 06/23/2010    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   Complication of anesthesia    GERD (gastroesophageal reflux disease)    Hearing aid worn    both ears   History of chemotherapy 2011   History of hiatal hernia    History of kidney stones    Hypertension    MGUS (monoclonal gammopathy of unknown significance)    dx by hematology per patient   Neuromuscular disorder (HCC)    left foot numbness since  eback surgery   Osteoporosis    PONV (postoperative nausea and vomiting)    Presence of permanent cardiac pacemaker    Sarcoidosis    in remission sees pumonary  once a year dr bellinger lov 08-21-2020 care everywhere   Sick sinus syndrome (HCC)    Wears glasses     Family History  Problem Relation Age of Onset   Hypertension Mother    Prostate cancer Father 40   Cancer Father    Hypertension Father    Stroke Father    Colon polyps Sister    Colon cancer Maternal Grandmother        dx in her 75s   Heart disease Maternal Grandfather    Colon cancer Paternal Grandmother    Heart disease Paternal Grandfather    Breast cancer Paternal Aunt 33   Colon cancer Paternal Aunt        dx in her 59s   Melanoma Cousin 52   Breast cancer Cousin 10       multiple cousins   Colon polyps Cousin    Breast cancer Cousin 89   Breast cancer Cousin        father's maternal 1st cousin   Breast cancer Cousin        Father's first cousin's daughter   Cancer Other        maternal great  uncle with Cancer - NOS   Cancer Other        maternal great grandmother with either colon or stomach cancer   Breast cancer Other        fathers maternal aunt   Past Surgical History:  Procedure Laterality Date   ABDOMINAL HYSTERECTOMY  2011   APPENDECTOMY  2011    ?with hysterectomy   BACK SURGERY  1997   lumbar   CARPAL TUNNEL RELEASE Right    CATARACT EXTRACTION Right 08/10/2023   CATARACT EXTRACTION Left 08/24/2023   CESAREAN SECTION  1982, 1985   COSMETIC SURGERY  2007   brow lift   CYSTOSCOPY WITH RETROGRADE PYELOGRAM, URETEROSCOPY AND STENT PLACEMENT Left 02/04/2021   Procedure: CYSTOSCOPY WITH  RETROGRADE PYELOGRAM, URETEROSCOPY AND STENT PLACEMENT;  Surgeon: Belva Agee, MD;  Location: [facility];  Service: Urology;  Laterality: Left;  1 HR; nephrolithiasis   ECTOPIC PREGNANCY SURGERY  1980   EXTRACORPOREAL SHOCK WAVE LITHOTRIPSY Left 06/23/2018   Procedure: LEFT EXTRACORPOREAL SHOCK WAVE LITHOTRIPSY (ESWL);  Surgeon: Malen Gauze, MD;  Location: [facility];  Service: Urology;  Laterality: Left;   GANGLION CYST EXCISION  1975   HOLMIUM LASER APPLICATION Left 02/04/2021   Procedure: HOLMIUM LASER APPLICATION;  Surgeon: Belva Agee, MD;  Location: [facility];  Service: Urology;  Laterality: Left;   PACEMAKER IMPLANT N/A 11/13/2020   Procedure: PACEMAKER IMPLANT;  Surgeon: Hillis Range, MD;  Location: [facility];  Service: Cardiovascular;  Laterality: N/A;   TONSILLECTOMY  1957   adenoids removed   Social History   Social History Narrative   Not on file   Immunization History  Administered Date(s) Administered   Fluad Quad(high Dose 65+) 06/22/2019, 06/15/2022   Influenza Nasal 07/06/2023   Influenza Split 07/07/2011, 07/07/2012, 07/04/2013   Influenza, High Dose Seasonal PF 06/22/2019, 07/01/2020, 06/17/2021, 12/16/2021   Influenza,inj,Quad PF,6+ Mos 07/09/2018   Influenza-Unspecified 07/05/2014, 06/26/2015,  07/05/2016, 07/29/2017   Moderna Sars-Covid-2 Vaccination 11/07/2019, 12/07/2019, 07/29/2020, 07/06/2023   PFIZER Comirnaty(Gray Top)Covid-19 Tri-Sucrose Vaccine 01/11/2021   Pfizer Covid-19 Vaccine Bivalent Booster 81yrs & up 06/17/2021, 01/27/2022   Pneumococcal Conjugate-13 02/26/2015, 04/19/2017   Pneumococcal Polysaccharide-23 10/05/2009, 07/11/2018, 12/16/2021   Respiratory Syncytial Virus Vaccine,Recomb Aduvanted(Arexvy) 07/16/2022   Tdap 06/05/2009, 03/19/2020   Unspecified SARS-COV-2 Vaccination 06/23/2022   Zoster Recombinant(Shingrix) 07/20/2023   Zoster, Live 10/05/2009     Objective: Vital Signs: BP (!) 145/85 (BP Location: Left Arm, Patient Position: Sitting, Cuff Size: Normal)   Pulse 83   Resp 16   Ht 5' 4.5" (1.638 m)   Wt 171 lb 3.2 oz (77.7 kg)   BMI 28.93 kg/m    Physical Exam Vitals and nursing note reviewed.  Constitutional:      Appearance: She is well-developed.  HENT:     Head: Normocephalic and atraumatic.  Eyes:     Conjunctiva/sclera: Conjunctivae normal.  Cardiovascular:     Rate and Rhythm: Normal rate and regular rhythm.     Heart sounds: Normal heart sounds.     Comments: Pacemaker  Pulmonary:     Effort: Pulmonary effort is normal.     Breath sounds: Normal breath sounds.  Abdominal:     General: Bowel sounds are normal.     Palpations: Abdomen is soft.  Musculoskeletal:     Cervical back: Normal range of motion.  Lymphadenopathy:     Cervical: No cervical adenopathy.  Skin:    General: Skin is warm and dry.     Capillary Refill: Capillary refill takes less than 2 seconds.  Neurological:     Mental Status: She is alert and oriented to person, place, and time.  Psychiatric:        Behavior: Behavior normal.      Musculoskeletal Exam: C-spine has good range of motion on examination today.  Painful range of motion of both shoulder joints.  Elbow joints, wrist joints, MCPs, PIPs, DIPs have good range of motion with no synovitis.   Tenderness over both wrist joints and MCP joints but no obvious synovitis was noted.  PIP and DIP thickening noted.  Difficulty making a complete fist.  Hip joints have good range of motion with no groin pain.  Knee joints have good  range of motion no warmth or effusion.  Ankle joints have good range of motion with no tenderness or joint swelling.  CDAI Exam: CDAI Score: -- Patient Global: --; Provider Global: -- Swollen: --; Tender: -- Joint Exam 10/25/2023   No joint exam has been documented for this visit   There is currently no information documented on the homunculus. Go to the Rheumatology activity and complete the homunculus joint exam.  Investigation: No additional findings.  Imaging: DG Chest Port 1 View Result Date: 09/27/2023 CLINICAL DATA:  Fever. EXAM: PORTABLE CHEST 1 VIEW COMPARISON:  June 03, 2023. FINDINGS: Stable cardiomediastinal silhouette. Left-sided pacemaker is unchanged. Stable bilateral upper lobe opacities are noted most consistent with scarring. Small nodular density is noted in right upper lobe. Bony thorax is unremarkable. IMPRESSION: Stable bilateral upper lobe opacities as noted above most consistent with scarring. Small nodular density is seen in right upper lobe which may represent scarring, but CT scan is recommended to rule out pulmonary nodule. Electronically Signed   By: Lupita Raider M.D.   On: 09/27/2023 08:49    Recent Labs: Lab Results  Component Value Date   WBC 8.4 10/22/2023   HGB 11.2 (L) 10/22/2023   PLT 211 10/22/2023   NA 138 10/22/2023   K 3.9 10/22/2023   CL 106 10/22/2023   CO2 27 10/22/2023   GLUCOSE 92 10/22/2023   BUN 17 10/22/2023   CREATININE 0.85 10/22/2023   BILITOT 0.4 10/22/2023   ALKPHOS 47 10/22/2023   AST 13 (L) 10/22/2023   ALT 8 10/22/2023   PROT 5.8 (L) 10/22/2023   ALBUMIN 3.3 (L) 10/22/2023   CALCIUM 8.5 (L) 10/22/2023   GFRAA 93 10/28/2020   QFTBGOLDPLUS NEGATIVE 07/06/2023    Speciality Comments:  PLQ EYE EXAM 09/22/2023 normal [facility] f/u 12 months.  Procedures:  No procedures performed Allergies: Augmentin [amoxicillin-pot clavulanate], Flomax [tamsulosin hcl], Latex, Tramadol, and Ciprofloxacin    Assessment / Plan:     Visit Diagnoses: Seronegative rheumatoid arthritis (HCC) - History of pain in multiple joints and swelling in hands:  Patient was initiated on Plaquenil 200 mg 1 tablet by mouth twice daily Monday through Friday after her last office visit on 08/04/23.  She has been tolerating plaquenil without any side effects.  Patient currently rates the pain in her hands a 3 out of 10.  She continues to have some stiffness and difficulty making a complete fist.  She has no obvious synovitis on examination today.  She is not currently taking any prednisone.  She has been on prednisone off and on since her last office visit due to recent infections including influenza A.  Patient reports that the pain and inflammation in her hands completely resolves while taking prednisone.  Patient is willing to give Plaquenil more time and for [place] to reassess for the full efficacy at her follow-up visit.  She plans on taking meloxicam 7.5 mg 1 tablet daily as needed for pain relief.  Discouraged the long-term daily use of NSAIDs and she voiced understanding.  She will require close lab monitoring.  She will notify us if she develops any new or worsening symptoms in the meantime.  She will follow up in 2-3 months.    High risk medication use - Plaquenil 200 mg 1 tablet by mouth twice daily Monday to Friday.  CBC and CMP updated on 10/22/23.  She will require updated lab work in April then every 5 months to monitor for drug toxicity.  Currently  under the care of hematology for further workup of anemia.  PLQ EYE EXAM 09/22/2023 normal [facility] f/u 12 months   Positive ANA (antinuclear antibody) - She has low titer positive ANA, ENA panel and complements are normal.   She has no clinical features of systemic lupus.  Current chronic use of systemic steroids - Recurrent use of prednisone since February 2024.  She has been off and on prednisone since her last office visit while recovering from recent infections.  Not currently taking prednisone.  Pain in both hands -Previous cortisone injections to MCP joints by Dr. Janee Morn.  ANA low titer positive.  X-rays showed osteoarthritic changes and juxta-articular osteopenia.  Patient continues to have persistent pain, stiffness, and intermittent inflammation in her hands despite initiating Plaquenil after her last office visit.  She has been off and on prednisone which helps to alleviate her symptoms.  She is not currently on prednisone and states that overall her hand pain has become more manageable the longer she has been on Plaquenil.  She currently rates the pain in her hands a 3 out of 10.  She is willing to give Plaquenil more time and for [place] to reassess for the full efficacy at her follow-up visit.  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  Dry eye syndrome of both eyes: Using systane eyedrops twice daily.   Age-related osteoporosis without current pathological fracture - November 05, 2021 T-score -2.6, BMD 0.756 AP spine.  Patient was given a prescription for Fosamax by her PCP which she has not started yet. DEXA will be due very 2025.  MGUS (monoclonal gammopathy of unknown significance) - Abnormal IFE.  Patient was evaluated by hematology.  Other eczema - Rash on buttocks and abdominal region.  She had punch biopsy by Dr. [place] which was consistent with eczema.  Other insomnia  Other fatigue: She has been experiencing increased fatigue over the past several weeks likely related to recent diagnosis of influenza A.  Patient initially started to have a fever on 09/27/2023 prompting further evaluation.  At that time blood cultures were negative.  On 09/30/2023 she started to  experience symptoms of upper respiratory infection.  She was evaluated and tested positive for influenza A.  She has been seen at urgent care a few times as well as been evaluated by her PCP.  She was recently treated with Augmentin as well as use a nebulizer.  She has also been off and on prednisone. While taking Augmentin she developed severe diarrhea.  Overall the patient still does not feel that her energy level has returned to baseline.  She has had to rest more throughout the day and continues to have a mild residual cough and congestion. Patient was encouraged to try to burst her immune system with zinc and vitamin C as well as to rest for the next few weeks.  Chronic midline low back pain with left-sided sciatica: Completed PT.   Other medical conditions are listed as follows:   Malignant neoplasm of ovary, unspecified laterality (HCC)  Pacemaker  Symptomatic sinus bradycardia  Pulmonary hypertension (HCC)  Obstructive sleep apnea  Salzmann's nodular dystrophy of both eyes  Abnormal SPEP: Under the care of hematology--recent lab work obtained on 10/22/23.   Orders: No orders of the defined types were placed in this encounter.  No orders of the defined types were placed in this encounter.    Follow-Up Instructions: Return in about 3 months (around 01/23/2024) for Rheumatoid arthritis.  Gearldine Bienenstock, PA-C  Note - This record has been created using Dragon software.  Chart creation errors have been sought, but may not always  have been located. Such creation errors do not reflect on  the standard of medical care.

## 2023-10-14 ENCOUNTER — Encounter: Payer: Self-pay | Admitting: Rheumatology

## 2023-10-14 ENCOUNTER — Ambulatory Visit: Payer: PPO | Admitting: Internal Medicine

## 2023-10-14 NOTE — Telephone Encounter (Signed)
 It is okay to stop prednisone abruptly at 10 mg as it is a short course of prednisone.

## 2023-10-14 NOTE — Telephone Encounter (Signed)
 I called patient, patient verbalized understanding.

## 2023-10-17 DIAGNOSIS — G4733 Obstructive sleep apnea (adult) (pediatric): Secondary | ICD-10-CM | POA: Diagnosis not present

## 2023-10-17 NOTE — Patient Instructions (Addendum)
 Given 1 gram IM Rocephin in office today.Rest and stay at home indoors and rest. Stay well hydrated. Continue course of Augmentin. Let us know if symptoms not improving within a few days.CXR Dec 23 showed no pneumonia.

## 2023-10-20 ENCOUNTER — Telehealth: Payer: Self-pay | Admitting: *Deleted

## 2023-10-20 ENCOUNTER — Ambulatory Visit (INDEPENDENT_AMBULATORY_CARE_PROVIDER_SITE_OTHER): Payer: PPO | Admitting: Internal Medicine

## 2023-10-20 ENCOUNTER — Encounter: Payer: Self-pay | Admitting: Internal Medicine

## 2023-10-20 ENCOUNTER — Ambulatory Visit: Payer: Self-pay | Admitting: Internal Medicine

## 2023-10-20 VITALS — BP 130/82 | HR 87 | Temp 100.1°F | Ht 64.5 in | Wt 172.0 lb

## 2023-10-20 DIAGNOSIS — R5383 Other fatigue: Secondary | ICD-10-CM

## 2023-10-20 DIAGNOSIS — R6883 Chills (without fever): Secondary | ICD-10-CM | POA: Diagnosis not present

## 2023-10-20 DIAGNOSIS — Z862 Personal history of diseases of the blood and blood-forming organs and certain disorders involving the immune mechanism: Secondary | ICD-10-CM

## 2023-10-20 DIAGNOSIS — R531 Weakness: Secondary | ICD-10-CM | POA: Diagnosis not present

## 2023-10-20 DIAGNOSIS — R509 Fever, unspecified: Secondary | ICD-10-CM

## 2023-10-20 DIAGNOSIS — R5381 Other malaise: Secondary | ICD-10-CM | POA: Diagnosis not present

## 2023-10-20 DIAGNOSIS — M0600 Rheumatoid arthritis without rheumatoid factor, unspecified site: Secondary | ICD-10-CM

## 2023-10-20 LAB — POCT FLU A/B STATUS
Influenza A, POC: NEGATIVE
Influenza B, POC: NEGATIVE

## 2023-10-20 LAB — POCT COVID BINAXNOW CARD: SARS Coronavirus 2 Ag: NEGATIVE

## 2023-10-20 LAB — POCT RAPID STREP A (OFFICE): Rapid Strep A Screen: NEGATIVE

## 2023-10-20 MED ORDER — AZITHROMYCIN 250 MG PO TABS: ORAL_TABLET | ORAL | 0 refills | Status: DC

## 2023-10-20 NOTE — Progress Notes (Signed)
   Subjective:    Patient ID: Julia Solis, female    DOB: 04/25/1951, 73 y.o.   MRN: [id number]  HPI Patient seen acutely today with persistent flulike symptoms.  Has had temperature of 101 degrees with chills.  She was diagnosed with acute influenza A at an urgent care on December 28.  Was prescribed Decadron and Tessalon Perles.  She went back to the urgent care on December 31.  Chest x-ray showed no evidence of pneumonia.  Still not feeling 100%.  Has generalized weakness malaise and fatigue.  CBC and c-Met drawn today.  White blood cell count is 10,300.  Electrolytes are normal.  Total protein and albumin are low but she has not been eating well recently.  Calcium is slightly low at 8.5.  Electrolytes are normal.    Review of Systems see above     Objective:   Physical Exam  Vital signs reviewed.  Blood pressure is 130/82 temperature 100.1 degrees pulse oximetry 97%  Skin warm and dry.  She does not appear to be tachypneic.  TMs and pharynx are clear.  Neck supple.  Chest clear.      Assessment & Plan:   Persistent flulike symptoms after being diagnosed with influenza at urgent care December 28.  Not sure why symptoms are still present.  Offered repeat chest x-ray but patient declines.  We have discussed various treatment options and have decided to give her Zithromax Z-PAK to take 2 tabs day 1 followed by 1 tab days 2 through 5.  She will rest and stay well-hydrated and stay out of the cold weather and let me know if symptoms not improving.  30 minutes spent reviewing chart, seeing patient, detailed discussion and medical decision making.

## 2023-10-20 NOTE — Telephone Encounter (Signed)
 Patient left voicemail, patient finished prednisone  taper and wants to know if she can take IBU again? Please advise.

## 2023-10-20 NOTE — Telephone Encounter (Signed)
 Patient states tylenol  is keeping temperature down, she does not want to go to [facility]. She said she would like to come in tomorrow appt scheduled for tomorrow at 12:00pm.

## 2023-10-20 NOTE — Telephone Encounter (Signed)
 Copied from CRM [id number]. Topic: Clinical - Red Word Triage >> Oct 20, 2023  7:39 AM Carlatta H wrote: [facility] that prompted transfer to Nurse Triage: Patient has had flu symptoms for 3 weeks//Patient has a temp off 101.03 and chills//    Chief Complaint:  Influenza Type A dx  01/28. Symptoms started 01/26.  Treated with Augmentin  and Rocephin  . Fever returned today  current temp 101.3. Chills. Cough Continues however it is better  Breathing is better.  Patient states coughing spells when she starts trying to talk. Symptoms: Fever temp 101.3. Chills. Cough  Frequency: daily since 01/26 Pertinent Negatives: Patient denies difficulty breathing. Disposition: [] ED /[] Urgent Care (no appt availability in office) / [x] Appointment(In office/virtual)/ []  [place] [facility] [facility]/ [] Home Care/ [] Refused Recommended Disposition /[] [facility]/ []  Follow-up with PCP Additional Notes:  Scheduled for tomorrow 10/21/23 . Patient requesting to be worked in today if possible.     Reason for Disposition  [1] Fever returns after gone for over 24 hours AND [2] symptoms worse or not improved  Answer Assessment - Initial Assessment Questions 1. DIAGNOSIS CONFIRMATION: "When was the influenza diagnosed?" "By whom?" "Did you get a test for it?"      Influenza A 2. MEDICINES: "Were you prescribed any medications for the influenza?"  (e.g., zanamivir [Relenza], oseltamavir [Tamiflu]).        No. 3. ONSET of SYMPTOMS: "When did your symptoms start?"     26 4. SYMPTOMS: "What symptoms are you most concerned about?" (e.g., runny nose, stuffy nose, sore throat, cough, breathing difficulty, fever)      Fever, cough  Stuffy nose , breathing is better 5. COUGH: "How bad is the cough?"      Cough is better but exist. Cough more when talking 6. FEVER: "Do you have a fever?" If Yes, ask: "What is your temperature, how was it measured, and when did it start?"     101.3 7. RESPIRATORY DISTRESS: "Are you having  any trouble breathing?" If Yes, ask: "Describe your breathing."   Denies. 8. FLU VACCINE: "Did you receive a flu shot this year?" (e.g., seasonal influenza, H1N1)     Did 10. HIGH RISK for COMPLICATIONS: "Do you have any heart or lung problems? Do you have a weakened immune system?" (e.g., CHF, COPD, asthma, HIV positive, chemotherapy, renal failure, diabetes mellitus, sickle cell anemia)       RA Sarcoidosis , Pacemaker( Sick Sinus Syndrome  Protocols used: Influenza Follow-up Call-A-AH

## 2023-10-20 NOTE — Telephone Encounter (Signed)
 I returned patient's call.  Patient stated that she has been off meloxicam  and ibuprofen since she was on prednisone .  She developed high fever today.  She wanted to take ibuprofen.  I advised her to take ibuprofen on as needed basis.  I also advised her to contact her PCP if her symptoms persist.  Regular use of NSAIDs was discouraged.  Increased risk of GI bleed, liver and renal toxicity was discussed.

## 2023-10-21 ENCOUNTER — Ambulatory Visit: Payer: PPO | Admitting: Internal Medicine

## 2023-10-21 LAB — COMPLETE METABOLIC PANEL WITH GFR
AG Ratio: 1.3 (calc) (ref 1.0–2.5)
ALT: 7 U/L (ref 6–29)
AST: 13 U/L (ref 10–35)
Albumin: 3.1 g/dL — ABNORMAL LOW (ref 3.6–5.1)
Alkaline phosphatase (APISO): 59 U/L (ref 37–153)
BUN: 14 mg/dL (ref 7–25)
CO2: 27 mmol/L (ref 20–32)
Calcium: 8.5 mg/dL — ABNORMAL LOW (ref 8.6–10.4)
Chloride: 106 mmol/L (ref 98–110)
Creat: 0.88 mg/dL (ref 0.60–1.00)
Globulin: 2.3 g/dL (ref 1.9–3.7)
Glucose, Bld: 98 mg/dL (ref 65–99)
Potassium: 4.5 mmol/L (ref 3.5–5.3)
Sodium: 140 mmol/L (ref 135–146)
Total Bilirubin: 0.5 mg/dL (ref 0.2–1.2)
Total Protein: 5.4 g/dL — ABNORMAL LOW (ref 6.1–8.1)
eGFR: 70 mL/min/{1.73_m2} (ref >=60)

## 2023-10-21 LAB — CBC WITH DIFFERENTIAL/PLATELET
Absolute Lymphocytes: 2163 {cells}/uL (ref 850–3900)
Absolute Monocytes: 906 {cells}/uL (ref 200–950)
Basophils Absolute: 31 {cells}/uL (ref 0–200)
Basophils Relative: 0.3 %
Eosinophils Absolute: 196 {cells}/uL (ref 15–500)
Eosinophils Relative: 1.9 %
HCT: 36.5 % (ref 35.0–45.0)
Hemoglobin: 11.8 g/dL (ref 11.7–15.5)
MCH: 28.2 pg (ref 27.0–33.0)
MCHC: 32.3 g/dL (ref 32.0–36.0)
MCV: 87.1 fL (ref 80.0–100.0)
MPV: 11.2 fL (ref 7.5–12.5)
Monocytes Relative: 8.8 %
Neutro Abs: 7004 {cells}/uL (ref 1500–7800)
Neutrophils Relative %: 68 %
Platelets: 229 10*3/uL (ref 140–400)
RBC: 4.19 10*6/uL (ref 3.80–5.10)
RDW: 14.9 % (ref 11.0–15.0)
Total Lymphocyte: 21 %
WBC: 10.3 10*3/uL (ref 3.8–10.8)

## 2023-10-22 ENCOUNTER — Inpatient Hospital Stay: Payer: PPO | Attending: Oncology

## 2023-10-22 ENCOUNTER — Other Ambulatory Visit: Payer: PPO

## 2023-10-22 DIAGNOSIS — M0600 Rheumatoid arthritis without rheumatoid factor, unspecified site: Secondary | ICD-10-CM | POA: Insufficient documentation

## 2023-10-22 DIAGNOSIS — R778 Other specified abnormalities of plasma proteins: Secondary | ICD-10-CM | POA: Insufficient documentation

## 2023-10-22 DIAGNOSIS — Z8543 Personal history of malignant neoplasm of ovary: Secondary | ICD-10-CM | POA: Insufficient documentation

## 2023-10-22 DIAGNOSIS — D472 Monoclonal gammopathy: Secondary | ICD-10-CM

## 2023-10-22 LAB — CBC WITH DIFFERENTIAL/PLATELET
Abs Immature Granulocytes: 0.02 10*3/uL (ref 0.00–0.07)
Basophils Absolute: 0 10*3/uL (ref 0.0–0.1)
Basophils Relative: 0 %
Eosinophils Absolute: 0.2 10*3/uL (ref 0.0–0.5)
Eosinophils Relative: 3 %
HCT: 35.5 % — ABNORMAL LOW (ref 36.0–46.0)
Hemoglobin: 11.2 g/dL — ABNORMAL LOW (ref 12.0–15.0)
Immature Granulocytes: 0 %
Lymphocytes Relative: 11 %
Lymphs Abs: 0.9 10*3/uL (ref 0.7–4.0)
MCH: 28 pg (ref 26.0–34.0)
MCHC: 31.5 g/dL (ref 30.0–36.0)
MCV: 88.8 fL (ref 80.0–100.0)
Monocytes Absolute: 0.6 10*3/uL (ref 0.1–1.0)
Monocytes Relative: 7 %
Neutro Abs: 6.6 10*3/uL (ref 1.7–7.7)
Neutrophils Relative %: 79 %
Platelets: 211 10*3/uL (ref 150–400)
RBC: 4 MIL/uL (ref 3.87–5.11)
RDW: 16.6 % — ABNORMAL HIGH (ref 11.5–15.5)
WBC: 8.4 10*3/uL (ref 4.0–10.5)
nRBC: 0 % (ref 0.0–0.2)

## 2023-10-22 LAB — COMPREHENSIVE METABOLIC PANEL
ALT: 8 U/L (ref 0–44)
AST: 13 U/L — ABNORMAL LOW (ref 15–41)
Albumin: 3.3 g/dL — ABNORMAL LOW (ref 3.5–5.0)
Alkaline Phosphatase: 47 U/L (ref 38–126)
Anion gap: 5 (ref 5–15)
BUN: 17 mg/dL (ref 8–23)
CO2: 27 mmol/L (ref 22–32)
Calcium: 8.5 mg/dL — ABNORMAL LOW (ref 8.9–10.3)
Chloride: 106 mmol/L (ref 98–111)
Creatinine, Ser: 0.85 mg/dL (ref 0.44–1.00)
GFR, Estimated: >60 mL/min (ref >60)
Glucose, Bld: 92 mg/dL (ref 70–99)
Potassium: 3.9 mmol/L (ref 3.5–5.1)
Sodium: 138 mmol/L (ref 135–145)
Total Bilirubin: 0.4 mg/dL (ref 0.0–1.2)
Total Protein: 5.8 g/dL — ABNORMAL LOW (ref 6.5–8.1)

## 2023-10-22 LAB — LACTATE DEHYDROGENASE: LDH: 178 U/L (ref 98–192)

## 2023-10-25 ENCOUNTER — Encounter: Payer: Self-pay | Admitting: Physician Assistant

## 2023-10-25 ENCOUNTER — Ambulatory Visit: Payer: PPO | Attending: Physician Assistant | Admitting: Physician Assistant

## 2023-10-25 VITALS — BP 145/85 | HR 83 | Resp 16 | Ht 64.5 in | Wt 171.2 lb

## 2023-10-25 DIAGNOSIS — I272 Pulmonary hypertension, unspecified: Secondary | ICD-10-CM

## 2023-10-25 DIAGNOSIS — R5383 Other fatigue: Secondary | ICD-10-CM | POA: Diagnosis not present

## 2023-10-25 DIAGNOSIS — G4733 Obstructive sleep apnea (adult) (pediatric): Secondary | ICD-10-CM

## 2023-10-25 DIAGNOSIS — M810 Age-related osteoporosis without current pathological fracture: Secondary | ICD-10-CM

## 2023-10-25 DIAGNOSIS — G8929 Other chronic pain: Secondary | ICD-10-CM

## 2023-10-25 DIAGNOSIS — M79641 Pain in right hand: Secondary | ICD-10-CM | POA: Diagnosis not present

## 2023-10-25 DIAGNOSIS — L308 Other specified dermatitis: Secondary | ICD-10-CM

## 2023-10-25 DIAGNOSIS — H04123 Dry eye syndrome of bilateral lacrimal glands: Secondary | ICD-10-CM

## 2023-10-25 DIAGNOSIS — Z7952 Long term (current) use of systemic steroids: Secondary | ICD-10-CM | POA: Diagnosis not present

## 2023-10-25 DIAGNOSIS — C569 Malignant neoplasm of unspecified ovary: Secondary | ICD-10-CM

## 2023-10-25 DIAGNOSIS — M5442 Lumbago with sciatica, left side: Secondary | ICD-10-CM

## 2023-10-25 DIAGNOSIS — D472 Monoclonal gammopathy: Secondary | ICD-10-CM | POA: Diagnosis not present

## 2023-10-25 DIAGNOSIS — M79642 Pain in left hand: Secondary | ICD-10-CM

## 2023-10-25 DIAGNOSIS — Z950 Presence of cardiac pacemaker: Secondary | ICD-10-CM

## 2023-10-25 DIAGNOSIS — R778 Other specified abnormalities of plasma proteins: Secondary | ICD-10-CM

## 2023-10-25 DIAGNOSIS — D869 Sarcoidosis, unspecified: Secondary | ICD-10-CM

## 2023-10-25 DIAGNOSIS — Z79899 Other long term (current) drug therapy: Secondary | ICD-10-CM | POA: Diagnosis not present

## 2023-10-25 DIAGNOSIS — G4709 Other insomnia: Secondary | ICD-10-CM

## 2023-10-25 DIAGNOSIS — R768 Other specified abnormal immunological findings in serum: Secondary | ICD-10-CM | POA: Diagnosis not present

## 2023-10-25 DIAGNOSIS — H18453 Nodular corneal degeneration, bilateral: Secondary | ICD-10-CM

## 2023-10-25 DIAGNOSIS — R001 Bradycardia, unspecified: Secondary | ICD-10-CM

## 2023-10-25 DIAGNOSIS — M0600 Rheumatoid arthritis without rheumatoid factor, unspecified site: Secondary | ICD-10-CM

## 2023-10-25 LAB — KAPPA/LAMBDA LIGHT CHAINS
Kappa free light chain: 24.9 mg/L — ABNORMAL HIGH (ref 3.3–19.4)
Kappa, lambda light chain ratio: 0.97 (ref 0.26–1.65)
Lambda free light chains: 25.8 mg/L (ref 5.7–26.3)

## 2023-10-25 NOTE — Patient Instructions (Signed)
Standing Labs We placed an order today for your standing lab work.   Please have your standing labs drawn in April and every 5 months   CMP and CBC with dff    Please have your labs drawn 2 weeks prior to your appointment so that the provider can discuss your lab results at your appointment, if possible.  Please note that you may see your imaging and lab results in MyChart before we have reviewed them. We will contact you once all results are reviewed. Please allow our office up to 72 hours to thoroughly review all of the results before contacting the office for clarification of your results.  WALK-IN LAB HOURS  Monday through Thursday from 8:00 am -12:30 pm and 1:00 pm-5:00 pm and Friday from 8:00 am-12:00 pm.  Patients with office visits requiring labs will be seen before walk-in labs.  You may encounter longer than normal wait times. Please allow additional time. Wait times may be shorter on  Monday and Thursday afternoons.  We do not book appointments for walk-in labs. We appreciate your patience and understanding with our staff.   Labs are drawn by [facility]. Please bring your co-pay at the time of your lab draw.  You may receive a bill from [facility] for your lab work.  Please note if you are on Hydroxychloroquine and and an order has been placed for a Hydroxychloroquine level,  you will need to have it drawn 4 hours or more after your last dose.  If you wish to have your labs drawn at another location, please call the office 24 hours in advance so we can fax the orders.  The office is located at [address], [place], [place] [address]   If you have any questions regarding directions or hours of operation,  please call [phone].   As a reminder, please drink plenty of water prior to coming for your lab work. Thanks!

## 2023-10-29 LAB — MULTIPLE MYELOMA PANEL, SERUM
Albumin SerPl Elph-Mcnc: 2.8 g/dL — ABNORMAL LOW (ref 2.9–4.4)
Albumin/Glob SerPl: 1.1 (ref 0.7–1.7)
Alpha 1: 0.3 g/dL (ref 0.0–0.4)
Alpha2 Glob SerPl Elph-Mcnc: 0.8 g/dL (ref 0.4–1.0)
B-Globulin SerPl Elph-Mcnc: 1 g/dL (ref 0.7–1.3)
Gamma Glob SerPl Elph-Mcnc: 0.6 g/dL (ref 0.4–1.8)
Globulin, Total: 2.6 g/dL (ref 2.2–3.9)
IgA: 174 mg/dL (ref 64–422)
IgG (Immunoglobin G), Serum: 802 mg/dL (ref 586–1602)
IgM (Immunoglobulin M), Srm: 61 mg/dL (ref 26–217)
M Protein SerPl Elph-Mcnc: 0.3 g/dL — ABNORMAL HIGH
Total Protein ELP: 5.4 g/dL — ABNORMAL LOW (ref 6.0–8.5)

## 2023-10-30 NOTE — Patient Instructions (Signed)
Your chart has been reviewed in detail.  You still have flulike symptoms persistent since December 28.  Not sure why symptoms are still present at this time.  Offered repeat chest x-ray but patient declines.  We will try Zithromax Z-PAK 2 tabs day 1 followed by 1 tab days 2 through 5.  She will rest, stay out of cold weather and stay well-hydrated.  She will call me if not improving within a couple of days or sooner if worse.

## 2023-11-01 ENCOUNTER — Inpatient Hospital Stay: Payer: PPO | Admitting: Oncology

## 2023-11-01 ENCOUNTER — Encounter: Payer: Self-pay | Admitting: Oncology

## 2023-11-01 VITALS — BP 142/80 | HR 79 | Temp 98.3°F | Wt 168.4 lb

## 2023-11-01 DIAGNOSIS — Z8543 Personal history of malignant neoplasm of ovary: Secondary | ICD-10-CM

## 2023-11-01 DIAGNOSIS — D472 Monoclonal gammopathy: Secondary | ICD-10-CM

## 2023-11-01 DIAGNOSIS — M0600 Rheumatoid arthritis without rheumatoid factor, unspecified site: Secondary | ICD-10-CM

## 2023-11-01 DIAGNOSIS — R778 Other specified abnormalities of plasma proteins: Secondary | ICD-10-CM | POA: Diagnosis not present

## 2023-11-01 NOTE — Assessment & Plan Note (Addendum)
-  Recently diagnosed.  Her rheumatologist started her on hydroxychloroquine in November 2024.

## 2023-11-01 NOTE — Assessment & Plan Note (Signed)
-  Stage 1 disease, diagnosed in early 2011. S/p optimal surgical debulking (TAH/BSO, omentectomy, pelvic and periaortic LND) in March 2011 and 6 cycles of carbo/taxol in July 2011. She was undergoing surveillance since that time with no evidence of recurrence.  - Last seen by her oncologist at outside facility in January 2021.  CA-125 was 11, normal.  She was discharged from their office. -Clinically no signs or symptoms to suggest disease recurrence.  Routine imaging is not indicated as per NCCN guidelines.  She does get CT of the chest at her pulmonologist office for her history of sarcoidosis and sometimes they will include CT abdomen pelvis for surveillance.

## 2023-11-01 NOTE — Assessment & Plan Note (Addendum)
Faint IgG lambda detected on serum immunoelectrophoresis on routine blood work done at her rheumatologist office.   -Discussed diagnosis, implications, plan of care. 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    -Discussed small risk of progression to smoldering myeloma or actual multiple myeloma and hence continued monitoring is recommended.    -To complete workup, on her initial consultation with [place] on 08/03/2023, we obtained serum free light chains, repeat SPEP and obtain 24-hour urine protein electrophoresis and immunoelectrophoresis.  SPEP showed no evidence of M spike.  IFE showed faint IgG band.  Serum free kappa and lambda were close to normal with normal ratio.  24-hour urine protein electrophoresis also came back unremarkable.  No CRAB features.   -Recent myeloma labs on 10/22/2023 showed 0.3 g/dL of M spike on SPEP, IFE showed it to be IgG lambda.  Quantitative immunoglobulins are within normal limits.  Serum free kappa was 24.9 mg/L, serum free lambda 25.8 mg/L, ratio normal at 0.97.  -Clinical picture remains consistent with MGUS.  Patient was provided reassurance.  Will continue surveillance.    Initially we will repeat labs in 3 months and later on every 6 to 12 months.  -Plan for follow-up visit in April 2025 with labs to be done 2 weeks prior.

## 2023-11-01 NOTE — Progress Notes (Signed)
[facility]  HEMATOLOGY CLINIC PROGRESS NOTE  PATIENT NAME: Julia Solis   MR#: [id number] DOB: [birth year]  Patient Care Team: Margaree Mackintosh, MD as PCP - General (Internal Medicine) Mealor, Roberts Gaudy, MD as Consulting Physician (Cardiology) Pollyann Savoy, MD as Consulting Physician (Rheumatology)  Date of visit: 11/01/2023   ASSESSMENT & PLAN:   Julia Solis is a 73 y.o. lady with a past medical history of sarcoidosis, pulmonary hypertension, sick sinus syndrome, hypertension, osteoporosis, renal stones, history of stage I ovarian cancer diagnosed in 2011, in remission, degenerative joint disease, was referred to our service in October 2024 for evaluation of monoclonal gammopathy.  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  Monoclonal gammopathy of unknown significance Faint IgG lambda detected on serum immunoelectrophoresis on routine blood work done at her rheumatologist office.   -Discussed diagnosis, implications, plan of care. 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    -Discussed small risk of progression to smoldering myeloma or actual multiple myeloma and hence continued monitoring is recommended.    -To complete workup, on her initial consultation with [place] on 08/03/2023, we obtained serum free light chains, repeat SPEP and obtain 24-hour urine protein electrophoresis and immunoelectrophoresis.  SPEP showed no evidence of M spike.  IFE showed faint IgG band.  Serum free kappa and lambda were close to normal with normal ratio.  24-hour urine protein electrophoresis also came back unremarkable.  No CRAB features.   -Recent myeloma labs on 10/22/2023 showed 0.3 g/dL of M spike on SPEP, IFE showed it to be IgG lambda.  Quantitative immunoglobulins are within normal limits.  Serum free kappa was 24.9 mg/L,  serum free lambda 25.8 mg/L, ratio normal at 0.97.  -Clinical picture remains consistent with MGUS.  Patient was provided reassurance.  Will continue surveillance.    Initially we will repeat labs in 3 months and later on every 6 to 12 months.  -Plan for follow-up visit in April 2025 with labs to be done 2 weeks prior.  History of ovarian cancer - Stage 1 disease, diagnosed in early 2011. S/p optimal surgical debulking (TAH/BSO, omentectomy, pelvic and periaortic LND) in March 2011 and 6 cycles of carbo/taxol in July 2011. She was undergoing surveillance since that time with no evidence of recurrence.  - Last seen by her oncologist at outside facility in January 2021.  CA-125 was 11, normal.  She was discharged from their office. -Clinically no signs or symptoms to suggest disease recurrence.  Routine imaging is not indicated as per NCCN guidelines.  She does get CT of the chest at her pulmonologist office for her history of sarcoidosis and sometimes they will include CT abdomen pelvis for surveillance.  Seronegative rheumatoid arthritis (HCC) -Recently diagnosed.  Her rheumatologist started her on hydroxychloroquine in November 2024.    I spent a total of 25 minutes during this encounter with the patient including review of chart and various tests results, discussions about plan of care and coordination of care plan.  I reviewed lab results and outside records for this visit and discussed relevant results with the patient. Diagnosis, plan of care and treatment options were also discussed in detail with the patient. Opportunity provided to ask questions and answers provided to her apparent satisfaction. Provided instructions to call our clinic with any problems, questions or concerns prior to return visit. I recommended to continue  follow-up with PCP and sub-specialists. She verbalized understanding and agreed with the plan. No barriers to learning was detected.  Meryl Crutch, MD  11/01/2023  4:35 PM  [facility] [address] [place] [address] Dept: [phone] Dept Fax: [phone]   CHIEF COMPLAINT/ REASON FOR VISIT:  Follow-up for IgG lambda MGUS.  INTERVAL HISTORY:  Discussed the use of AI scribe software for clinical note transcription with the patient, who gave verbal consent to proceed.   Patient returns for follow-up.  She has experienced two episodes of unexplained high fever and recently recovered from the flu. Despite receiving the flu shot, the patient was severely ill, describing it as worse than her experience with COVID. She has been taking Plaquenil for her RA for two months and has noticed an improvement in her hand pain, although some pain has returned since discontinuing steroids.  The patient has also been dealing with fluctuating hemoglobin levels, which have been monitored by her primary care physician and rheumatologist. She has been advised to take a multivitamin with iron to help manage this. The patient has also experienced significant weight loss, losing almost twenty pounds, and has been struggling with her appetite.  SUMMARY OF HEMATOLOGIC HISTORY:  73 y.o. lady with a past medical history of sarcoidosis, pulmonary hypertension, sick sinus syndrome, hypertension, osteoporosis, renal stones, history of stage I ovarian cancer diagnosed in 2011, in remission, degenerative joint disease, was referred to our service for evaluation of monoclonal gammopathy.     Discussed the use of AI scribe software for clinical note transcription with the patient, who gave verbal consent to proceed.    She was recently referred to a rheumatologist by her PCP.  Her rheumatologist pursued workup for suspected polymyalgia rheumatica (PMR) or rheumatoid arthritis (RA).  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  Immunoelectrophoresis  showed faint IgG lambda monoclonal protein.  Given this finding, she was referred to [place] for further evaluation.     To complete workup, on her initial consultation with [place] on 08/03/2023, we obtained serum free light chains, repeat SPEP and obtain 24-hour urine protein electrophoresis and immunoelectrophoresis.  SPEP showed no evidence of M spike.  IFE showed faint IgG band.  Serum free kappa and lambda were close to normal with normal ratio.  24-hour urine protein electrophoresis also came back unremarkable.  No CRAB features.    Myeloma labs on 10/22/2023 showed 0.3 g/dL of M spike on SPEP, IFE showed it to be IgG lambda.  Quantitative immunoglobulins are within normal limits.  Serum free kappa was 24.9 mg/L, serum free lambda 25.8 mg/L, ratio normal at 0.97.  Clinical picture remains consistent with MGUS.  Patient was provided reassurance.  Will continue surveillance.  Initially we will repeat labs in 3 months and later on every 6 to 12 months.  I have reviewed the past medical history, past surgical history, social history and family history with the patient and they are unchanged from previous note.  ALLERGIES: She is allergic to augmentin [amoxicillin-pot clavulanate], flomax [tamsulosin hcl], latex, tramadol, and ciprofloxacin.  MEDICATIONS:  Current Outpatient Medications  Medication Sig Dispense Refill   Cholecalciferol (VITAMIN D) 50 MCG (2000 UT) tablet Take 2,000 Units by mouth daily.     famotidine (PEPCID) 20 MG tablet Take 20 mg by mouth at bedtime.     fluorometholone (FML) 0.1 % ophthalmic suspension Place 1 drop into  both eyes 2 (two) times daily. Taking twice a day for two weeks, then once a day for two weeks     hydroxychloroquine (PLAQUENIL) 200 MG tablet Take 1 tablet 200 mg twice daily from Monday through Friday only. DO NOT TAKE ON SATURDAY AND SUNDAY 120 tablet 0   loratadine (CLARITIN) 10 MG tablet Take 10 mg by mouth daily.     LORazepam (ATIVAN) 1 MG tablet TAKE 1 TABLET  BY MOUTH EVERY DAY AT BEDTIME AS NEEDED 90 tablet 0   losartan (COZAAR) 25 MG tablet TAKE 1 TABLET (25 MG TOTAL) BY MOUTH DAILY. 90 tablet 3   meloxicam (MOBIC) 15 MG tablet Take 1 tablet (15 mg total) by mouth as needed. PRN 30 tablet 0   Multiple Vitamin (MULTIVITAMIN WITH MINERALS) TABS tablet Take 1 tablet by mouth daily.     Polyethyl Glycol-Propyl Glycol (SYSTANE) 0.4-0.3 % GEL ophthalmic gel Place 1 application  into both eyes in the morning and at bedtime.     rosuvastatin (CRESTOR) 5 MG tablet TAKE 1 TABLET BY MOUTH 3 TIMES A WEEK WITH SUPPER 36 tablet 2   fluticasone (FLONASE) 50 MCG/ACT nasal spray Place into the nose 3 (three) times a week. (Patient not taking: Reported on 11/01/2023)     oxyCODONE (OXY IR/ROXICODONE) 5 MG immediate release tablet Take 5 mg by mouth daily as needed. (Patient not taking: Reported on 11/01/2023)     No current facility-administered medications for this visit.     REVIEW OF SYSTEMS:    ROS  All other pertinent systems were reviewed with the patient and are negative.  PHYSICAL EXAMINATION:  ECOG PERFORMANCE STATUS: 1 - Symptomatic but completely ambulatory  There were no vitals filed for this visit. There were no vitals filed for this visit.  Physical Exam Constitutional:      General: She is not in acute distress.    Appearance: Normal appearance.  HENT:     Head: Normocephalic and atraumatic.  Eyes:     General: No scleral icterus.    Conjunctiva/sclera: Conjunctivae normal.  Cardiovascular:     Rate and Rhythm: Normal rate and regular rhythm.     Heart sounds: Normal heart sounds.  Pulmonary:     Effort: Pulmonary effort is normal.     Breath sounds: Normal breath sounds.  Abdominal:     General: There is no distension.  Musculoskeletal:     Right lower leg: No edema.     Left lower leg: No edema.  Neurological:     General: No focal deficit present.     Mental Status: She is alert and oriented to person, place, and time.   Psychiatric:        Mood and Affect: Mood normal.        Behavior: Behavior normal.        Thought Content: Thought content normal.     LABORATORY DATA:   I have reviewed the data as listed.  Recent Results   Kappa/lambda light chains     Status: Abnormal   Collection Time: 10/22/23  1:59 PM  Result Value Ref Range   Kappa free light chain 24.9 (H) 3.3 - 19.4 mg/L   Lambda free light chains 25.8 5.7 - 26.3 mg/L   Kappa, lambda light chain ratio 0.97 0.26 - 1.65    Comment: (NOTE) Performed At: [facility] [address] [place], [place] [id number] Jolene Schimke MD WU:[id number]   Multiple Myeloma Panel (SPEP&IFE w/QIG)     Status:  Abnormal   Collection Time: 10/22/23  1:59 PM  Result Value Ref Range   IgG (Immunoglobin G), Serum 802 586 - 1,602 mg/dL   IgA 161 64 - 096 mg/dL   IgM (Immunoglobulin M), Srm 61 26 - 217 mg/dL   Total Protein ELP 5.4 (L) 6.0 - 8.5 g/dL   Albumin SerPl Elph-Mcnc 2.8 (L) 2.9 - 4.4 g/dL   Alpha 1 0.3 0.0 - 0.4 g/dL   Alpha2 Glob SerPl Elph-Mcnc 0.8 0.4 - 1.0 g/dL   B-Globulin SerPl Elph-Mcnc 1.0 0.7 - 1.3 g/dL   Gamma Glob SerPl Elph-Mcnc 0.6 0.4 - 1.8 g/dL   M Protein SerPl Elph-Mcnc 0.3 (H) Not Observed g/dL   Globulin, Total 2.6 2.2 - 3.9 g/dL   Albumin/Glob SerPl 1.1 0.7 - 1.7   IFE 1 Comment (A)     Comment: (NOTE) Immunofixation shows IgG monoclonal protein with lambda light chain specificity.    Please Note Comment     Comment: (NOTE) Protein electrophoresis scan will follow via computer, mail, or courier delivery. Performed At: [facility] [address] [place], [place] [id number] Jolene Schimke MD BJ:[id number]   Lactate dehydrogenase     Status: None   Collection Time: 10/22/23  1:59 PM  Result Value Ref Range   LDH 178 98 - 192 U/L    Comment: Performed at [facility], [address] [place], [place] [address]  Comprehensive metabolic panel     Status: Abnormal   Collection Time:  10/22/23  1:59 PM  Result Value Ref Range   Sodium 138 135 - 145 mmol/L   Potassium 3.9 3.5 - 5.1 mmol/L   Chloride 106 98 - 111 mmol/L   CO2 27 22 - 32 mmol/L   Glucose, Bld 92 70 - 99 mg/dL    Comment: Glucose reference range applies only to samples taken after fasting for at least 8 hours.   BUN 17 8 - 23 mg/dL   Creatinine, Ser 8.46 0.44 - 1.00 mg/dL   Calcium 8.5 (L) 8.9 - 10.3 mg/dL   Total Protein 5.8 (L) 6.5 - 8.1 g/dL   Albumin 3.3 (L) 3.5 - 5.0 g/dL   AST 13 (L) 15 - 41 U/L   ALT 8 0 - 44 U/L   Alkaline Phosphatase 47 38 - 126 U/L   Total Bilirubin 0.4 0.0 - 1.2 mg/dL   GFR, Estimated >96 >29 mL/min    Comment: (NOTE) Calculated using the CKD-EPI Creatinine Equation (2021)    Anion gap 5 5 - 15    Comment: Performed at [facility], [address] [place], [place] [address]  CBC with Differential/Platelet     Status: Abnormal   Collection Time: 10/22/23  1:59 PM  Result Value Ref Range   WBC 8.4 4.0 - 10.5 K/uL   RBC 4.00 3.87 - 5.11 MIL/uL   Hemoglobin 11.2 (L) 12.0 - 15.0 g/dL   HCT 32.4 (L) 40.1 - 02.7 %   MCV 88.8 80.0 - 100.0 fL   MCH 28.0 26.0 - 34.0 pg   MCHC 31.5 30.0 - 36.0 g/dL   RDW 25.3 (H) 66.4 - 40.3 %   Platelets 211 150 - 400 K/uL   nRBC 0.0 0.0 - 0.2 %   Neutrophils Relative % 79 %   Neutro Abs 6.6 1.7 - 7.7 K/uL   Lymphocytes Relative 11 %   Lymphs Abs 0.9 0.7 - 4.0 K/uL   Monocytes Relative 7 %   Monocytes Absolute 0.6 0.1 -  1.0 K/uL   Eosinophils Relative 3 %   Eosinophils Absolute 0.2 0.0 - 0.5 K/uL   Basophils Relative 0 %   Basophils Absolute 0.0 0.0 - 0.1 K/uL   Immature Granulocytes 0 %   Abs Immature Granulocytes 0.02 0.00 - 0.07 K/uL    Comment: Performed at [facility], [address] [place], [place] [address]       RADIOGRAPHIC STUDIES:  No recent pertinent imaging studies available to review.  Orders Placed This Encounter  Procedures   CBC with Differential/Platelet     Standing Status:   Future    Expected Date:   01/10/2024    Expiration Date:   10/31/2024   Comprehensive metabolic panel    Standing Status:   Future    Expected Date:   01/10/2024    Expiration Date:   10/31/2024   Lactate dehydrogenase    Standing Status:   Future    Expected Date:   01/10/2024    Expiration Date:   10/31/2024   Multiple Myeloma Panel (SPEP&IFE w/QIG)    Standing Status:   Future    Expected Date:   01/10/2024    Expiration Date:   10/31/2024   Kappa/lambda light chains    Standing Status:   Future    Expected Date:   01/10/2024    Expiration Date:   10/31/2024     Future Appointments  Date Time Provider Department Center  11/11/2023  7:00 AM [facility] DEVICE REMOTES [facility]  12/14/2023  1:10 PM [facility] None  01/20/2024  1:00 PM [facility] PHLEBOTOMIST [facility] None  01/24/2024 12:50 PM Gearldine Bienenstock, PA-C [facility] None  01/31/2024 11:20 AM Faithe Ariola, Archie Patten, MD [facility] None  02/10/2024  7:00 AM [facility] DEVICE REMOTES [facility]  05/11/2024  7:00 AM [facility] DEVICE REMOTES [facility]  08/10/2024  7:00 AM [facility] DEVICE REMOTES [facility]  08/22/2024  9:30 AM [facility]  08/24/2024 11:00 AM Baxley, Luanna Cole, MD [facility]     This document was completed utilizing speech recognition software. Grammatical errors, random word insertions, pronoun errors, and incomplete sentences are an occasional consequence of this system due to software limitations, ambient noise, and hardware issues. Any formal questions or concerns about the content, text or information contained within the body of this dictation should be directly addressed to the provider for clarification.

## 2023-11-11 ENCOUNTER — Ambulatory Visit (INDEPENDENT_AMBULATORY_CARE_PROVIDER_SITE_OTHER): Payer: PPO

## 2023-11-11 DIAGNOSIS — I495 Sick sinus syndrome: Secondary | ICD-10-CM

## 2023-11-15 ENCOUNTER — Telehealth: Payer: Self-pay | Admitting: Cardiovascular Disease

## 2023-11-15 NOTE — Telephone Encounter (Signed)
  1. Has your device fired? no  2. Is you device beeping? no  3. Are you experiencing draining or swelling at device site? no  4. Are you calling to see if we received your device transmission? No   5. Have you passed out? No  Pt tried to order new Cpap mask and it told her she shouldn't order it because of the metal clips if she has a pacemaker. She would like some clarification   Please route to Device Clinic Pool

## 2023-11-16 NOTE — Telephone Encounter (Signed)
Spoke with pt and let her know she is able to wear a cpap mask with her ppm. Pt was just trying to be cautious and make sure

## 2023-11-17 DIAGNOSIS — G4733 Obstructive sleep apnea (adult) (pediatric): Secondary | ICD-10-CM | POA: Diagnosis not present

## 2023-11-17 DIAGNOSIS — M25552 Pain in left hip: Secondary | ICD-10-CM | POA: Diagnosis not present

## 2023-11-18 DIAGNOSIS — G4733 Obstructive sleep apnea (adult) (pediatric): Secondary | ICD-10-CM | POA: Diagnosis not present

## 2023-11-18 LAB — CUP PACEART REMOTE DEVICE CHECK
Date Time Interrogation Session: 20250206084334
Implantable Lead Connection Status: 753985
Implantable Lead Connection Status: 753985
Implantable Lead Implant Date: 20220201
Implantable Lead Implant Date: 20220201
Implantable Lead Location: 753859
Implantable Lead Location: 753860
Implantable Pulse Generator Implant Date: 20220201
Pulse Gen Model: 2272
Pulse Gen Serial Number: 3896658

## 2023-11-19 ENCOUNTER — Other Ambulatory Visit: Payer: Self-pay | Admitting: Internal Medicine

## 2023-11-19 ENCOUNTER — Other Ambulatory Visit: Payer: Self-pay | Admitting: Rheumatology

## 2023-11-19 DIAGNOSIS — M0600 Rheumatoid arthritis without rheumatoid factor, unspecified site: Secondary | ICD-10-CM

## 2023-11-19 NOTE — Telephone Encounter (Signed)
Last Fill: 08/04/2023  Eye exam: 09/22/2023 normal    Labs: 10/22/2023 Calcium 8.5, Total Protein 5.8, Albumin 3.3, AST 13, Hgb 11.2, Hct 35.5, RDW 16.6  Next Visit: 01/24/2024  Last Visit: 10/25/2023  AV:[id number] rheumatoid arthritis   Current Dose per office note 10/25/2023: Plaquenil 200 mg 1 tablet by mouth twice daily Monday to Friday.   Okay to refill Plaquenil?

## 2023-11-21 ENCOUNTER — Encounter: Payer: Self-pay | Admitting: Cardiovascular Disease

## 2023-11-22 ENCOUNTER — Other Ambulatory Visit: Payer: Self-pay | Admitting: Internal Medicine

## 2023-11-22 DIAGNOSIS — M25552 Pain in left hip: Secondary | ICD-10-CM | POA: Diagnosis not present

## 2023-11-24 DIAGNOSIS — D2272 Melanocytic nevi of left lower limb, including hip: Secondary | ICD-10-CM | POA: Diagnosis not present

## 2023-11-24 DIAGNOSIS — Z85828 Personal history of other malignant neoplasm of skin: Secondary | ICD-10-CM | POA: Diagnosis not present

## 2023-11-24 DIAGNOSIS — L309 Dermatitis, unspecified: Secondary | ICD-10-CM | POA: Diagnosis not present

## 2023-11-24 DIAGNOSIS — D2261 Melanocytic nevi of right upper limb, including shoulder: Secondary | ICD-10-CM | POA: Diagnosis not present

## 2023-11-24 DIAGNOSIS — D2239 Melanocytic nevi of other parts of face: Secondary | ICD-10-CM | POA: Diagnosis not present

## 2023-11-24 DIAGNOSIS — M25552 Pain in left hip: Secondary | ICD-10-CM | POA: Diagnosis not present

## 2023-11-24 DIAGNOSIS — D225 Melanocytic nevi of trunk: Secondary | ICD-10-CM | POA: Diagnosis not present

## 2023-11-24 DIAGNOSIS — B353 Tinea pedis: Secondary | ICD-10-CM | POA: Diagnosis not present

## 2023-11-24 DIAGNOSIS — L821 Other seborrheic keratosis: Secondary | ICD-10-CM | POA: Diagnosis not present

## 2023-11-24 DIAGNOSIS — D2271 Melanocytic nevi of right lower limb, including hip: Secondary | ICD-10-CM | POA: Diagnosis not present

## 2023-11-24 DIAGNOSIS — D2262 Melanocytic nevi of left upper limb, including shoulder: Secondary | ICD-10-CM | POA: Diagnosis not present

## 2023-11-29 DIAGNOSIS — M25552 Pain in left hip: Secondary | ICD-10-CM | POA: Diagnosis not present

## 2023-12-02 DIAGNOSIS — M25552 Pain in left hip: Secondary | ICD-10-CM | POA: Diagnosis not present

## 2023-12-06 DIAGNOSIS — M25552 Pain in left hip: Secondary | ICD-10-CM | POA: Diagnosis not present

## 2023-12-07 DIAGNOSIS — Z1231 Encounter for screening mammogram for malignant neoplasm of breast: Secondary | ICD-10-CM | POA: Diagnosis not present

## 2023-12-07 LAB — HM MAMMOGRAPHY

## 2023-12-08 DIAGNOSIS — M25552 Pain in left hip: Secondary | ICD-10-CM | POA: Diagnosis not present

## 2023-12-13 ENCOUNTER — Telehealth (INDEPENDENT_AMBULATORY_CARE_PROVIDER_SITE_OTHER): Payer: Self-pay | Admitting: Otolaryngology

## 2023-12-13 DIAGNOSIS — M25552 Pain in left hip: Secondary | ICD-10-CM | POA: Diagnosis not present

## 2023-12-13 NOTE — Telephone Encounter (Signed)
 Confirmed appt and location with patient for 12/14/2023.

## 2023-12-14 ENCOUNTER — Encounter (INDEPENDENT_AMBULATORY_CARE_PROVIDER_SITE_OTHER): Payer: Self-pay

## 2023-12-14 ENCOUNTER — Ambulatory Visit (INDEPENDENT_AMBULATORY_CARE_PROVIDER_SITE_OTHER): Payer: PPO | Admitting: Otolaryngology

## 2023-12-14 VITALS — BP 146/88 | HR 93 | Ht 64.5 in | Wt 166.0 lb

## 2023-12-14 DIAGNOSIS — J343 Hypertrophy of nasal turbinates: Secondary | ICD-10-CM | POA: Insufficient documentation

## 2023-12-14 DIAGNOSIS — J0101 Acute recurrent maxillary sinusitis: Secondary | ICD-10-CM | POA: Insufficient documentation

## 2023-12-14 DIAGNOSIS — J342 Deviated nasal septum: Secondary | ICD-10-CM | POA: Insufficient documentation

## 2023-12-14 DIAGNOSIS — J329 Chronic sinusitis, unspecified: Secondary | ICD-10-CM

## 2023-12-14 MED ORDER — LEVOFLOXACIN 500 MG PO TABS: 500.0000 mg | ORAL_TABLET | Freq: Every day | ORAL | 0 refills | Status: AC

## 2023-12-14 NOTE — Progress Notes (Signed)
 Patient ID: Julia Solis, female   DOB: [birth year], 73 y.o.   MRN: [id number]  Follow-up: Chronic nasal obstruction  HPI: The patient is a 73 year old female who returns today for her follow-up evaluation.  The patient was previously seen for chronic nasal obstruction.  She was noted to have nasal mucosal congestion, nasal septal deviation, and bilateral inferior turbinate hypertrophy.  She was treated with Flonase nasal spray and nasal saline irrigation.  According to the patient, she was doing well until December 2024, when she was diagnosed with a flu infection.  After the flu, she has noted persistent facial pain, nasal congestion, and nasal drainage.  Her left midface has been tender to touch.  She was treated with Augmentin, Rocephin shots, and steroids.  However, she continues to be symptomatic.  She is still on Flonase and saline nasal irrigation daily.  Exam: General: Communicates without difficulty, well nourished, no acute distress. Head: Normocephalic, no evidence injury, no tenderness, facial buttresses intact without stepoff. Face/sinus: No tenderness to palpation and percussion. Facial movement is normal and symmetric. Eyes: PERRL, EOMI. No scleral icterus, conjunctivae clear. Neuro: CN II exam reveals vision grossly intact.  No nystagmus at any point of gaze. Ears: Auricles well formed without lesions.  Ear canals are intact without mass or lesion.  No erythema or edema is appreciated.  The TMs are intact without fluid. Nose: External evaluation reveals normal support and skin without lesions.  Dorsum is intact.  Anterior rhinoscopy reveals edematous and erythematous mucosa over anterior aspect of inferior turbinates and intact septum. Oral:  Oral cavity and oropharynx are intact, symmetric, without erythema or edema.  Mucosa is moist without lesions. Neck: Full range of motion without pain.  There is no significant lymphadenopathy.  No masses palpable.  Thyroid bed within normal limits to  palpation.  Parotid glands and submandibular glands equal bilaterally without mass.  Trachea is midline. Neuro:  CN 2-12 grossly intact.   Assessment: 1.  Acute rhinosinusitis with erythematous and edematous nasal mucosa.  The patient has been experiencing persistent left midface tenderness. 2.  Nasal septal deviation and bilateral inferior turbinate hypertrophy.  Plan: 1.  The physical exam findings are reviewed with the patient. 2.  Levofloxacin 500 mg p.o. daily for 7 days. 3.  Continue with Flonase and nasal saline irrigation daily. 4.  The patient will return for reevaluation in 2 weeks.  If she continues to be symptomatic, we will proceed with a sinus CT scan.

## 2023-12-15 ENCOUNTER — Telehealth: Payer: Self-pay | Admitting: *Deleted

## 2023-12-15 DIAGNOSIS — M25552 Pain in left hip: Secondary | ICD-10-CM | POA: Diagnosis not present

## 2023-12-15 DIAGNOSIS — G4733 Obstructive sleep apnea (adult) (pediatric): Secondary | ICD-10-CM | POA: Diagnosis not present

## 2023-12-15 NOTE — Telephone Encounter (Signed)
 Plaquenil and levofloxacin both have the risk for QT prolongation so she can hold plaquenil until completing the dose of levofloxacin.   Levofloxacin is a fluoroquinolone--this class of antibiotics carries the risk of tendon rupture (rare)-please make the patient aware.

## 2023-12-15 NOTE — Telephone Encounter (Signed)
 Patient advised Plaquenil and levofloxacin both have the risk for QT prolongation so she can hold plaquenil until completing the dose of levofloxacin.   Levofloxacin is a fluoroquinolone--this class of antibiotics carries the risk of tendon rupture (rare).

## 2023-12-15 NOTE — Telephone Encounter (Signed)
 Patient contacted the office and left a message stating her ENT prescribed Levofloxacin 500 mg po daily for 7 days for a sinus infection. Patient states she got home and was reading through the prescription information and saw that it may cause problems if taken and having Rheumatoid Arthritis. Patient would like to know if she should be okay to proceed with taking the antibiotic. Patient is on PLQ.

## 2023-12-16 NOTE — Progress Notes (Signed)
 Remote pacemaker transmission.

## 2023-12-16 NOTE — Addendum Note (Signed)
 Addended by: Elease Etienne A on: 12/16/2023 01:40 PM   Modules accepted: Orders

## 2023-12-20 ENCOUNTER — Other Ambulatory Visit (INDEPENDENT_AMBULATORY_CARE_PROVIDER_SITE_OTHER): Payer: Self-pay | Admitting: Otolaryngology

## 2023-12-20 DIAGNOSIS — M25552 Pain in left hip: Secondary | ICD-10-CM | POA: Diagnosis not present

## 2023-12-20 MED ORDER — AMOXICILLIN 875 MG PO TABS: 875.0000 mg | ORAL_TABLET | Freq: Two times a day (BID) | ORAL | 0 refills | Status: AC

## 2023-12-22 DIAGNOSIS — M25552 Pain in left hip: Secondary | ICD-10-CM | POA: Diagnosis not present

## 2023-12-27 DIAGNOSIS — M25552 Pain in left hip: Secondary | ICD-10-CM | POA: Diagnosis not present

## 2023-12-28 ENCOUNTER — Encounter: Payer: Self-pay | Admitting: Internal Medicine

## 2023-12-29 DIAGNOSIS — M25552 Pain in left hip: Secondary | ICD-10-CM | POA: Diagnosis not present

## 2023-12-30 ENCOUNTER — Ambulatory Visit (INDEPENDENT_AMBULATORY_CARE_PROVIDER_SITE_OTHER)

## 2023-12-30 ENCOUNTER — Encounter (INDEPENDENT_AMBULATORY_CARE_PROVIDER_SITE_OTHER): Payer: Self-pay

## 2023-12-30 VITALS — HR 99 | Ht 64.5 in | Wt 166.0 lb

## 2023-12-30 DIAGNOSIS — R0981 Nasal congestion: Secondary | ICD-10-CM | POA: Diagnosis not present

## 2023-12-30 DIAGNOSIS — J343 Hypertrophy of nasal turbinates: Secondary | ICD-10-CM | POA: Diagnosis not present

## 2023-12-30 DIAGNOSIS — J310 Chronic rhinitis: Secondary | ICD-10-CM | POA: Diagnosis not present

## 2023-12-30 DIAGNOSIS — J342 Deviated nasal septum: Secondary | ICD-10-CM

## 2023-12-30 DIAGNOSIS — J0101 Acute recurrent maxillary sinusitis: Secondary | ICD-10-CM

## 2023-12-30 NOTE — Progress Notes (Unsigned)
 Patient ID: Julia Solis, female   DOB: Feb 12, 1951, 73 y.o.   MRN: [id number]  Follow-up: Chronic nasal obstruction, acute sinusitis  HPI: The patient is a 73 year old female who returns today for her follow-up evaluation.  She was last seen 2 weeks ago.  At that time, she was noted to have an acute sinusitis.  She was treated with levofloxacin, Flonase, and nasal saline irrigation.  She also has a history of chronic nasal obstruction, secondary to nasal septal deviation and bilateral inferior turbinate hypertrophy.  The patient returns today reporting resolution of her facial pain and pressure.  She continues to have occasional nasal congestion.  She denies any fever or visual change.  Exam: General: Communicates without difficulty, well nourished, no acute distress. Head: Normocephalic, no evidence injury, no tenderness, facial buttresses intact without stepoff. Face/sinus: No tenderness to palpation and percussion. Facial movement is normal and symmetric. Eyes: PERRL, EOMI. No scleral icterus, conjunctivae clear. Neuro: CN II exam reveals vision grossly intact.  No nystagmus at any point of gaze. Ears: Auricles well formed without lesions.  Ear canals are intact without mass or lesion.  No erythema or edema is appreciated.  The TMs are intact without fluid. Nose: External evaluation reveals normal support and skin without lesions.  Dorsum is intact.  Anterior rhinoscopy reveals congested mucosa over anterior aspect of inferior turbinates and intact septum.  No purulence noted. Oral:  Oral cavity and oropharynx are intact, symmetric, without erythema or edema.  Mucosa is moist without lesions. Neck: Full range of motion without pain.  There is no significant lymphadenopathy.  No masses palpable.  Thyroid bed within normal limits to palpation.  Parotid glands and submandibular glands equal bilaterally without mass.  Trachea is midline. Neuro:  CN 2-12 grossly intact.   Assessment: 1.  The patient's  acute sinusitis has resolved.  No purulent drainage or erythema is noted today. 2.  Chronic rhinitis with nasal mucosal congestion, nasal septal deviation, and bilateral inferior turbinate hypertrophy.  Plan: 1.  The physical exam findings are reviewed with the patient. 2.  Continue with Flonase nasal spray and nasal saline irrigation. 3.  The patient will return for reevaluation in 6 months, sooner if needed.

## 2024-01-03 DIAGNOSIS — M25552 Pain in left hip: Secondary | ICD-10-CM | POA: Diagnosis not present

## 2024-01-05 ENCOUNTER — Other Ambulatory Visit: Payer: Self-pay | Admitting: *Deleted

## 2024-01-05 DIAGNOSIS — M25552 Pain in left hip: Secondary | ICD-10-CM | POA: Diagnosis not present

## 2024-01-05 DIAGNOSIS — M810 Age-related osteoporosis without current pathological fracture: Secondary | ICD-10-CM

## 2024-01-07 DIAGNOSIS — M5416 Radiculopathy, lumbar region: Secondary | ICD-10-CM | POA: Diagnosis not present

## 2024-01-08 ENCOUNTER — Ambulatory Visit
Admission: RE | Admit: 2024-01-08 | Discharge: 2024-01-08 | Disposition: A | Discharge status: Home / Self Care | Source: Ambulatory Visit | Attending: Internal Medicine | Admitting: Internal Medicine

## 2024-01-08 DIAGNOSIS — M810 Age-related osteoporosis without current pathological fracture: Secondary | ICD-10-CM | POA: Diagnosis not present

## 2024-01-10 ENCOUNTER — Encounter: Payer: Self-pay | Admitting: Internal Medicine

## 2024-01-10 DIAGNOSIS — M25552 Pain in left hip: Secondary | ICD-10-CM | POA: Diagnosis not present

## 2024-01-10 NOTE — Progress Notes (Signed)
 Office Visit Note  Patient: Julia Solis             Date of Birth: [birth year]           MRN: [id number]             PCP: Sylvan Evener, MD Referring: Sylvan Evener, MD Visit Date: 01/24/2024 Occupation: @GUAROCC @  Subjective:  Pain in both hands   History of Present Illness: Josefita Weissmann is a 73 y.o. female with history of seronegative rheumatoid arthritis.  Patient remains on  Plaquenil  200 mg 1 tablet by mouth twice daily Monday to Friday.  She is tolerating Plaquenil  without any side effects.  She was recently treated with a course of antibiotics for sinusitis by ENT.  Patient held Plaquenil  for 4 days while taking the antibiotic but notices severe recurrence of pain and stiffness in both hands.  Patient states that she was also prescribed a steroid Dosepak recently for her lower back which also helped to alleviate pain and inflammation in her hands.  While taking the steroid the inflammation in her hands completely resolved and has since returned since discontinuing steroids on 01/13/2024.  Patient states that while taking Plaquenil  her pain levels range from a 3 out of 4 out of 10 but while taking steroids her pain was 0 out of 10.  She is unsure if Plaquenil  is as effective as it should be.  Patient states that she was previous under the care of endocrinology.  Patient states that her endocrinologist prescribed Fosamax but she never initiated the medication.  Patient had updated bone density and was told that her osteoporosis has worsened so she was considering initiating Fosamax.  She would also like to discuss treatment alternatives to Fosamax.  Her symptoms of reflux are currently well-controlled taking famotidine at bedtime. She is planning on receiving updated COVID-19 vaccine.   Activities of Daily Living:  Patient reports morning stiffness for 2 hours.   Patient Denies nocturnal pain.  Difficulty dressing/grooming: Denies Difficulty climbing stairs: Denies Difficulty  getting out of chair: Denies Difficulty using hands for taps, buttons, cutlery, and/or writing: Reports  Review of Systems  Constitutional:  Positive for fatigue.  HENT:  Positive for mouth dryness. Negative for mouth sores.   Eyes:  Positive for dryness.  Respiratory:  Negative for shortness of breath.   Cardiovascular:  Negative for chest pain and palpitations.  Gastrointestinal:  Negative for blood in stool, constipation and diarrhea.  Endocrine: Negative for increased urination.  Genitourinary:  Negative for involuntary urination.  Musculoskeletal:  Positive for joint pain, gait problem, joint pain, joint swelling, myalgias, muscle weakness, morning stiffness and myalgias. Negative for muscle tenderness.  Skin:  Negative for color change, rash, hair loss and sensitivity to sunlight.  Allergic/Immunologic: Positive for susceptible to infections.  Neurological:  Negative for dizziness and headaches.  Hematological:  Negative for swollen glands.  Psychiatric/Behavioral:  Positive for sleep disturbance. Negative for depressed mood. The patient is not nervous/anxious.     PMFS History:  Patient Active Problem List   Diagnosis Date Noted   Acute recurrent maxillary sinusitis 12/14/2023   Deviated nasal septum 12/14/2023   Hypertrophy of nasal turbinates 12/14/2023   Seronegative rheumatoid arthritis (HCC) 08/23/2023   Monoclonal gammopathy of unknown significance 08/03/2023   Primary osteoarthritis involving multiple joints 08/03/2023   Edema 06/26/2023   Hand arthritis 04/27/2023   Symptomatic sinus bradycardia 03/20/2022   Pacemaker 03/20/2022   Dry eye syndrome 10/16/2015   NS (  nuclear sclerosis) 10/16/2015   Dystrophy, 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   Dry eye syndrome of both lacrimal glands 10/16/2015   Elevated LDL cholesterol level 02/26/2015   Back pain 02/20/2014   Insomnia 02/20/2014   Allergic rhinitis 02/20/2014   Osteopenia 07/07/2012   Fatigue 08/17/2011    Nonspecific (abnormal) findings on radiological and other examination of body structure 06/24/2010   ABNORMAL LUNG XRAY 06/24/2010   History of ovarian cancer 06/23/2010    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   Complication of anesthesia    GERD (gastroesophageal reflux disease)    Hearing aid worn    both ears   History of chemotherapy 2011   History of hiatal hernia    History of kidney stones    Hypertension    MGUS (monoclonal gammopathy of unknown significance)    dx by hematology per patient   Neuromuscular disorder (HCC)    left foot numbness since  eback surgery   Osteoporosis    PONV (postoperative nausea and vomiting)    Presence of permanent cardiac pacemaker    Sarcoidosis    in remission sees pumonary  once a year dr bellinger lov 08-21-2020 care everywhere   Sick sinus syndrome (HCC)    Wears glasses     Family History  Problem Relation Age of Onset   Hypertension Mother    Prostate cancer Father 44   Cancer Father    Hypertension Father    Stroke Father    Colon polyps Sister    Colon cancer Maternal Grandmother        dx in her 70s   Heart disease Maternal Grandfather    Colon cancer Paternal Grandmother    Heart disease Paternal Grandfather    Breast cancer Paternal Aunt 58   Colon cancer Paternal Aunt        dx in her 67s   Melanoma Cousin 33   Breast cancer Cousin 54       multiple cousins   Colon polyps Cousin    Breast cancer Cousin 60   Breast cancer Cousin        father's maternal 1st cousin   Breast cancer Cousin        Father's first cousin's daughter   Cancer Other        maternal great uncle with Cancer - NOS   Cancer Other        maternal great grandmother with either colon or stomach cancer   Breast cancer Other        fathers maternal aunt   Past Surgical History:  Procedure Laterality Date   ABDOMINAL HYSTERECTOMY  2011   APPENDECTOMY  2011    ?with hysterectomy   BACK SURGERY  1997    lumbar   CARPAL TUNNEL RELEASE Right    CATARACT EXTRACTION Right 08/10/2023   CATARACT EXTRACTION Left 08/24/2023   CESAREAN SECTION  1982, 1985   COSMETIC SURGERY  2007   brow lift   CYSTOSCOPY WITH RETROGRADE PYELOGRAM, URETEROSCOPY AND STENT PLACEMENT Left 02/04/2021   Procedure: CYSTOSCOPY WITH RETROGRADE PYELOGRAM, URETEROSCOPY AND STENT PLACEMENT;  Surgeon: Sherlyn Ditto, MD;  Location: [facility];  Service: Urology;  Laterality: Left;  1 HR; nephrolithiasis   ECTOPIC PREGNANCY SURGERY  1980   EXTRACORPOREAL SHOCK WAVE LITHOTRIPSY Left 06/23/2018   Procedure: LEFT EXTRACORPOREAL SHOCK WAVE LITHOTRIPSY (ESWL);  Surgeon: Marco Severs, MD;  Location: [facility];  Service: Urology;  Laterality: Left;  GANGLION CYST EXCISION  1975   HOLMIUM LASER APPLICATION Left 02/04/2021   Procedure: HOLMIUM LASER APPLICATION;  Surgeon: Sherlyn Ditto, MD;  Location[facility];  Service: Urology;  Laterality: Left;   PACEMAKER IMPLANT N/A 11/13/2020   Procedure: PACEMAKER IMPLANT;  Surgeon: Jolly Needle, MD;  Location: [facility];  Service: Cardiovascular;  Laterality: N/A;   TONSILLECTOMY  1957   adenoids removed   Social History   Social History Narrative   Not on file   Immunization History  Administered Date(s) Administered   Fluad Quad(high Dose 65+) 06/22/2019, 06/15/2022   Fluad Trivalent(High Dose 65+) 07/06/2023   Influenza Nasal 07/06/2023   Influenza Split 07/07/2011, 07/07/2012, 07/04/2013   Influenza, High Dose Seasonal PF 06/22/2019, 07/01/2020, 06/17/2021, 12/16/2021   Influenza,inj,Quad PF,6+ Mos 07/09/2018   Influenza-Unspecified 07/05/2014, 06/26/2015, 07/05/2016, 07/29/2017   Moderna Sars-Covid-2 Vaccination 11/07/2019, 12/07/2019, 07/29/2020, 07/06/2023   PFIZER Comirnaty(Gray Top)Covid-19 Tri-Sucrose Vaccine 01/11/2021   Pfizer Covid-19 Vaccine Bivalent Booster 81yrs & up 06/17/2021, 01/27/2022   Pneumococcal  Conjugate-13 02/26/2015, 04/19/2017   Pneumococcal Polysaccharide-23 10/05/2009, 07/11/2018, 12/16/2021   Respiratory Syncytial Virus Vaccine,Recomb Aduvanted(Arexvy) 07/16/2022   Tdap 06/05/2009, 03/19/2020   Unspecified SARS-COV-2 Vaccination 06/23/2022   Zoster Recombinant(Shingrix) 07/20/2023   Zoster, Live 10/05/2009     Objective: Vital Signs: BP (!) 168/88 (BP Location: Left Arm, Patient Position: Sitting, Cuff Size: Normal)   Pulse 65   Resp 14   Ht 5\' 4"  (1.626 m)   Wt 168 lb (76.2 kg)   BMI 28.84 kg/m    Physical Exam Vitals and nursing note reviewed.  Constitutional:      Appearance: She is well-developed.  HENT:     Head: Normocephalic and atraumatic.  Eyes:     Conjunctiva/sclera: Conjunctivae normal.  Cardiovascular:     Rate and Rhythm: Normal rate and regular rhythm.     Heart sounds: Normal heart sounds.  Pulmonary:     Effort: Pulmonary effort is normal.     Breath sounds: Normal breath sounds.  Abdominal:     General: Bowel sounds are normal.     Palpations: Abdomen is soft.  Musculoskeletal:     Cervical back: Normal range of motion.  Lymphadenopathy:     Cervical: No cervical adenopathy.  Skin:    General: Skin is warm and dry.     Capillary Refill: Capillary refill takes less than 2 seconds.  Neurological:     Mental Status: She is alert and oriented to person, place, and time.  Psychiatric:        Behavior: Behavior normal.      Musculoskeletal Exam: C-spine has good range of motion.  Discomfort range of motion of both shoulders.  Elbow joints and wrist joints have good range of motion.  PIP and DIP thickening noted.  Tenderness of bilateral first MCP joints and the right first PIP joint.  Hip joints have good range of motion with no groin pain.  Knee joints have good range of motion with no warmth or effusion.  Ankle joints have good range of motion with no tenderness or joint swelling.  CDAI Exam: CDAI Score: -- Patient Global: --;  Provider Global: -- Swollen: --; Tender: -- Joint Exam 01/24/2024   No joint exam has been documented for this visit   There is currently no information documented on the homunculus. Go to the Rheumatology activity and complete the homunculus joint exam.  Investigation: No additional findings.  Imaging: DG Bone Density Result Date: 01/10/2024 EXAM:  DUAL X-RAY ABSORPTIOMETRY (DXA) FOR BONE MINERAL DENSITY IMPRESSION: Referring Physician:  Sylvan Evener Your patient completed a bone mineral density test using GE Lunar iDXA system (analysis version: 16). Technologist: BEC PATIENT: Name: Aneta, Hendershott Patient ID: [id number] Birth Date: 04-08-1951 Height: 64.0 in. Sex: Female Measured: 01/08/2024 Weight: 164.0 lbs. Indications: Advanced Age, Bilateral Oophorectomy (65.51), Family Hist. (Parent hip fracture), Famotidine, Hysterectomy, LSpine Surgery, osteoarthritis, Ovarian Cancer, Plaquenil , Prednisone , Rheumatoid Arthritis (714.0), Sarcoidosis Fractures: NONE Treatments: Multivitamin, Vitamin D  (E933.5) ASSESSMENT: The BMD measured at AP Spine L1-L4 is 0.834 g/cm2 with a T-score of -2.9. This patient is considered osteoporotic according to World Health Organization [facility]) criteria. The quality of the exam is good. Site Region Measured Date Measured Age YA BMD Significant CHANGE T-score AP Spine  L1-L4      01/08/2024    71.8         -2.9    0.834 g/cm2 DualFemur Neck Right 01/08/2024    71.8         -2.3    0.720 g/cm2 DualFemur Total Mean 01/08/2024    71.8         -2.3    0.717 g/cm2 World Health Organization [facility]) criteria for post-menopausal, Caucasian Women: Normal       T-score at or above -1 SD Osteopenia   T-score between -1 and -2.5 SD Osteoporosis T-score at or below -2.5 SD RECOMMENDATION: 1. All patients should optimize calcium  and vitamin D  intake. 2. Consider FDA-approved medical therapies in postmenopausal women and men aged 64 years and older, based on the following: a. A hip or vertebral  (clinical or morphometric) fracture. b. T-score = -2.5 at the femoral neck or spine after appropriate evaluation to exclude secondary causes. c. Low bone mass (T-score between -1.0 and -2.5 at the femoral neck or spine) and a 10-year probability of a hip fracture = 3% or a 10-year probability of a major osteoporosis-related fracture = 20% based on the [place] -adapted WHO algorithm. d. Clinician judgment and/or patient preferences may indicate treatment for people with 10-year fracture probabilities above or below these levels. FOLLOW-UP: Patients with diagnosis of osteoporosis or at high risk for fracture should have regular bone mineral density tests.? Patients eligible for Medicare are allowed routine testing every 2 years.? The testing frequency can be increased to one year for patients who have rapidly progressing disease, are receiving or discontinuing medical therapy to restore bone mass, or have additional risk factors. I have reviewed this study and agree with the findings. [facility] Electronically Signed   By: Dina  Arceo M.D.   On: 01/10/2024 08:17    Recent Labs: Lab Results  Component Value Date   WBC 6.1 01/20/2024   HGB 12.8 01/20/2024   PLT 180 01/20/2024   NA 139 01/20/2024   K 4.1 01/20/2024   CL 105 01/20/2024   CO2 29 01/20/2024   GLUCOSE 101 (H) 01/20/2024   BUN 16 01/20/2024   CREATININE 0.89 01/20/2024   BILITOT 0.4 01/20/2024   ALKPHOS 68 01/20/2024   AST 15 01/20/2024   ALT 9 01/20/2024   PROT 6.2 (L) 01/20/2024   ALBUMIN 3.7 01/20/2024   CALCIUM  9.3 01/20/2024   GFRAA 93 10/28/2020   QFTBGOLDPLUS NEGATIVE 07/06/2023    Speciality Comments: PLQ EYE EXAM 09/22/2023 normal [facility] f/u 12 months.  Procedures:  No procedures performed Allergies: Augmentin  [amoxicillin -pot clavulanate], Flomax [tamsulosin hcl], Latex, Tramadol , and Ciprofloxacin   Assessment / Plan:     Visit Diagnoses:  Seronegative rheumatoid arthritis (HCC) -  Patient presents today with ongoing pain and stiffness involving both hands.  Overall her symptoms have been tolerable but she notices a significant relief in her symptoms if she is prescribed a steroid taper.  When rating her pain and stiffness her symptoms typically range from a 3-4 out of 10 while taking Plaquenil  as monotherapy.  She recently had a gap in therapy for 4 days and had a recurrence of pain and inflammation in both hands.  On examination today showed no obvious synovitis but had tenderness over bilateral first MCP joints and the right first PIP joint.  Typically when she has increased pain it is in her MCP joints. Plan to schedule an ultrasound of both hands to assess for synovitis.  She is apprehensive to initiate combination therapy at this time.  She will follow-up in the office in 3 months or sooner if needed. Plan: Sedimentation rate, C-reactive protein  High risk medication use - Plaquenil  200 mg 1 tablet by mouth twice daily Monday to Friday. PLQ EYE EXAM 09/22/2023 normal [facility] f/u 12 months.  CBC and CMP updated on 01/20/24.   She is planning on having an updated covid-19 vaccine.   Positive ANA (antinuclear antibody) - low titer positive ANA, ENA panel and complements are normal.  No clinical features of systemic lupus.  Current chronic use of systemic steroids - Recurrent use of prednisone  since February 2024.  She has been off and on prednisone --most recent taper was discontinued on 01/13/2024.  Patient was advised to avoid the use of steroids and NSAID use prior to coming for the ultrasound of both hands.  Pain in both hands - Previous cortisone injections to MCP joints by Dr. Hildy Lowers.  ANA low titer positive.  X-rays showed osteoarthritic changes and juxta-articular osteopenia.  Patient continues to experience pain and stiffness involving both hands.  Overall her symptoms have been significantly more tolerable since initiating Plaquenil  but she  remains unsure if Plaquenil  is keeping her symptoms fully at bay.  Plan to schedule ultrasound of both hands to assess for synovitis.  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  She remains under the care of pulmonology yearly.   Chest x-ray from 10/05/2023 was reviewed today in the office: No evidence of acute cardiac or pulmonary abnormality.  Similar bronchiectasis and upper lobe predominant reticular opacities and/or scarring favoring chronic changes of sarcoidosis were noted. Currently asymptomatic.  Recommended continuing to follow-up with pulmonology on a yearly basis.  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 She uses systane drops BID for symptomatic relief.    Age-related osteoporosis without current pathological fracture -  Previous DEXA 11/05/21: T-score -2.6, BMD 0.756 AP spine.  DEXA updated on 01/08/2024:The BMD measured at AP Spine L1-L4 is 0.834 g/cm2 with a T-score of -2.9.  Patient was previously evaluated by endocrinology who recommended initiating Fosamax 70 mg 1 tablet by mouth once weekly.  Patient has the prescription but has not yet initiated therapy.  Patient clarified if it was safe for her to initiate Fosamax or if there was any other treatment alternatives.  Discussed treatment alternatives today in detail.  She would like to initiate Fosamax 70 mg 1 tablet by mouth once weekly.  Potential side effects were discussed.  She will notify us  if she cannot tolerate taking Fosamax. Her symptoms of GERD are 100% well controlled taking famotidine at bedtime.  IV Reclast could be an option in the future if  she has a recurrence of reflux.  She is apprehensive to initiate Prolia, Evenity, or a daily antibiotic agent.    Other medical conditions are listed as follows:   MGUS (monoclonal gammopathy of unknown significance) - Abnormal IFE.  Patient was evaluated by hematology  Other eczema - She had punch biopsy by Dr. [place] which was consistent with  eczema.  Other insomnia  Other fatigue   Chronic midline low back pain with left-sided sciatica  Malignant neoplasm of ovary, unspecified laterality (HCC)  Pacemaker  Symptomatic sinus bradycardia  Pulmonary hypertension (HCC)  Obstructive sleep apnea  Salzmann's nodular dystrophy of both eyes  Abnormal SPEP  Orders: Orders Placed This Encounter  Procedures   Sedimentation rate   C-reactive protein   No orders of the defined types were placed in this encounter.   Follow-Up Instructions: Return in about 3 months (around 04/24/2024) for Rheumatoid arthritis.   Romayne Clubs, PA-C  Note - This record has been created using Dragon software.  Chart creation errors have been sought, but may not always  have been located. Such creation errors do not reflect on  the standard of medical care.

## 2024-01-13 ENCOUNTER — Other Ambulatory Visit: Payer: Self-pay | Admitting: Orthopedic Surgery

## 2024-01-13 DIAGNOSIS — M5416 Radiculopathy, lumbar region: Secondary | ICD-10-CM

## 2024-01-20 ENCOUNTER — Inpatient Hospital Stay: Payer: PPO | Attending: Oncology

## 2024-01-20 DIAGNOSIS — Z8543 Personal history of malignant neoplasm of ovary: Secondary | ICD-10-CM | POA: Diagnosis not present

## 2024-01-20 DIAGNOSIS — D472 Monoclonal gammopathy: Secondary | ICD-10-CM

## 2024-01-20 DIAGNOSIS — R778 Other specified abnormalities of plasma proteins: Secondary | ICD-10-CM | POA: Insufficient documentation

## 2024-01-20 LAB — COMPREHENSIVE METABOLIC PANEL WITH GFR
ALT: 9 U/L (ref 0–44)
AST: 15 U/L (ref 15–41)
Albumin: 3.7 g/dL (ref 3.5–5.0)
Alkaline Phosphatase: 68 U/L (ref 38–126)
Anion gap: 5 (ref 5–15)
BUN: 16 mg/dL (ref 8–23)
CO2: 29 mmol/L (ref 22–32)
Calcium: 9.3 mg/dL (ref 8.9–10.3)
Chloride: 105 mmol/L (ref 98–111)
Creatinine, Ser: 0.89 mg/dL (ref 0.44–1.00)
GFR, Estimated: >60 mL/min (ref >60)
Glucose, Bld: 101 mg/dL — ABNORMAL HIGH (ref 70–99)
Potassium: 4.1 mmol/L (ref 3.5–5.1)
Sodium: 139 mmol/L (ref 135–145)
Total Bilirubin: 0.4 mg/dL (ref 0.0–1.2)
Total Protein: 6.2 g/dL — ABNORMAL LOW (ref 6.5–8.1)

## 2024-01-20 LAB — CBC WITH DIFFERENTIAL/PLATELET
Abs Immature Granulocytes: 0.01 10*3/uL (ref 0.00–0.07)
Basophils Absolute: 0 10*3/uL (ref 0.0–0.1)
Basophils Relative: 1 %
Eosinophils Absolute: 0.2 10*3/uL (ref 0.0–0.5)
Eosinophils Relative: 4 %
HCT: 39.9 % (ref 36.0–46.0)
Hemoglobin: 12.8 g/dL (ref 12.0–15.0)
Immature Granulocytes: 0 %
Lymphocytes Relative: 26 %
Lymphs Abs: 1.6 10*3/uL (ref 0.7–4.0)
MCH: 28.5 pg (ref 26.0–34.0)
MCHC: 32.1 g/dL (ref 30.0–36.0)
MCV: 88.9 fL (ref 80.0–100.0)
Monocytes Absolute: 0.6 10*3/uL (ref 0.1–1.0)
Monocytes Relative: 10 %
Neutro Abs: 3.6 10*3/uL (ref 1.7–7.7)
Neutrophils Relative %: 59 %
Platelets: 180 10*3/uL (ref 150–400)
RBC: 4.49 MIL/uL (ref 3.87–5.11)
RDW: 14 % (ref 11.5–15.5)
WBC: 6.1 10*3/uL (ref 4.0–10.5)
nRBC: 0 % (ref 0.0–0.2)

## 2024-01-20 LAB — LACTATE DEHYDROGENASE: LDH: 152 U/L (ref 98–192)

## 2024-01-24 ENCOUNTER — Ambulatory Visit: Payer: PPO | Attending: Physician Assistant | Admitting: Physician Assistant

## 2024-01-24 ENCOUNTER — Encounter: Payer: Self-pay | Admitting: Physician Assistant

## 2024-01-24 VITALS — BP 168/88 | HR 65 | Resp 14 | Ht 64.0 in | Wt 168.0 lb

## 2024-01-24 DIAGNOSIS — H04123 Dry eye syndrome of bilateral lacrimal glands: Secondary | ICD-10-CM

## 2024-01-24 DIAGNOSIS — R001 Bradycardia, unspecified: Secondary | ICD-10-CM

## 2024-01-24 DIAGNOSIS — R5383 Other fatigue: Secondary | ICD-10-CM

## 2024-01-24 DIAGNOSIS — I272 Pulmonary hypertension, unspecified: Secondary | ICD-10-CM

## 2024-01-24 DIAGNOSIS — D869 Sarcoidosis, unspecified: Secondary | ICD-10-CM

## 2024-01-24 DIAGNOSIS — R768 Other specified abnormal immunological findings in serum: Secondary | ICD-10-CM | POA: Diagnosis not present

## 2024-01-24 DIAGNOSIS — R778 Other specified abnormalities of plasma proteins: Secondary | ICD-10-CM

## 2024-01-24 DIAGNOSIS — Z7952 Long term (current) use of systemic steroids: Secondary | ICD-10-CM

## 2024-01-24 DIAGNOSIS — L308 Other specified dermatitis: Secondary | ICD-10-CM | POA: Diagnosis not present

## 2024-01-24 DIAGNOSIS — M79641 Pain in right hand: Secondary | ICD-10-CM

## 2024-01-24 DIAGNOSIS — M5442 Lumbago with sciatica, left side: Secondary | ICD-10-CM

## 2024-01-24 DIAGNOSIS — G4709 Other insomnia: Secondary | ICD-10-CM | POA: Diagnosis not present

## 2024-01-24 DIAGNOSIS — M0600 Rheumatoid arthritis without rheumatoid factor, unspecified site: Secondary | ICD-10-CM | POA: Diagnosis not present

## 2024-01-24 DIAGNOSIS — G4733 Obstructive sleep apnea (adult) (pediatric): Secondary | ICD-10-CM

## 2024-01-24 DIAGNOSIS — G8929 Other chronic pain: Secondary | ICD-10-CM

## 2024-01-24 DIAGNOSIS — Z950 Presence of cardiac pacemaker: Secondary | ICD-10-CM

## 2024-01-24 DIAGNOSIS — D472 Monoclonal gammopathy: Secondary | ICD-10-CM

## 2024-01-24 DIAGNOSIS — M810 Age-related osteoporosis without current pathological fracture: Secondary | ICD-10-CM | POA: Diagnosis not present

## 2024-01-24 DIAGNOSIS — H18453 Nodular corneal degeneration, bilateral: Secondary | ICD-10-CM

## 2024-01-24 DIAGNOSIS — Z79899 Other long term (current) drug therapy: Secondary | ICD-10-CM | POA: Diagnosis not present

## 2024-01-24 DIAGNOSIS — M79642 Pain in left hand: Secondary | ICD-10-CM

## 2024-01-24 DIAGNOSIS — C569 Malignant neoplasm of unspecified ovary: Secondary | ICD-10-CM

## 2024-01-24 LAB — MULTIPLE MYELOMA PANEL, SERUM
Albumin SerPl Elph-Mcnc: 3.3 g/dL (ref 2.9–4.4)
Albumin/Glob SerPl: 1.4 (ref 0.7–1.7)
Alpha 1: 0.2 g/dL (ref 0.0–0.4)
Alpha2 Glob SerPl Elph-Mcnc: 0.7 g/dL (ref 0.4–1.0)
B-Globulin SerPl Elph-Mcnc: 0.9 g/dL (ref 0.7–1.3)
Gamma Glob SerPl Elph-Mcnc: 0.7 g/dL (ref 0.4–1.8)
Globulin, Total: 2.4 g/dL (ref 2.2–3.9)
IgA: 111 mg/dL (ref 64–422)
IgG (Immunoglobin G), Serum: 888 mg/dL (ref 586–1602)
IgM (Immunoglobulin M), Srm: 62 mg/dL (ref 26–217)
Total Protein ELP: 5.7 g/dL — ABNORMAL LOW (ref 6.0–8.5)

## 2024-01-24 LAB — KAPPA/LAMBDA LIGHT CHAINS
Kappa free light chain: 14.9 mg/L (ref 3.3–19.4)
Kappa, lambda light chain ratio: 0.7 (ref 0.26–1.65)
Lambda free light chains: 21.4 mg/L (ref 5.7–26.3)

## 2024-01-24 NOTE — Patient Instructions (Addendum)
ESR and CRP

## 2024-01-31 ENCOUNTER — Inpatient Hospital Stay: Payer: PPO | Admitting: Oncology

## 2024-01-31 ENCOUNTER — Encounter: Payer: Self-pay | Admitting: Oncology

## 2024-01-31 VITALS — BP 161/81 | HR 72 | Temp 98.7°F | Resp 18 | Ht 64.0 in | Wt 171.0 lb

## 2024-01-31 DIAGNOSIS — D472 Monoclonal gammopathy: Secondary | ICD-10-CM

## 2024-01-31 DIAGNOSIS — M0600 Rheumatoid arthritis without rheumatoid factor, unspecified site: Secondary | ICD-10-CM

## 2024-01-31 DIAGNOSIS — R778 Other specified abnormalities of plasma proteins: Secondary | ICD-10-CM | POA: Diagnosis not present

## 2024-01-31 DIAGNOSIS — Z8543 Personal history of malignant neoplasm of ovary: Secondary | ICD-10-CM

## 2024-01-31 NOTE — Assessment & Plan Note (Addendum)
-   Stage 1 disease, diagnosed in early 2011. S/p optimal surgical debulking (TAH/BSO, omentectomy, pelvic and periaortic LND) in March 2011 and 6 cycles of carbo/taxol in July 2011. She was undergoing surveillance since that time with no evidence of recurrence.  - Last seen by her oncologist at outside facility in January 2021.  CA-125 was 11, normal.  She was discharged from their office. -Clinically no signs or symptoms to suggest disease recurrence.  Routine imaging is not indicated as per NCCN guidelines.  She does get CT of the chest at her pulmonologist office for her history of sarcoidosis and usually they have been including CT abdomen pelvis for surveillance.

## 2024-01-31 NOTE — Assessment & Plan Note (Signed)
-  Recently diagnosed.  Her rheumatologist started her on hydroxychloroquine in November 2024.

## 2024-01-31 NOTE — Progress Notes (Signed)
 [facility]  HEMATOLOGY CLINIC PROGRESS NOTE  PATIENT NAME: Julia Solis   MR#: [id number] DOB: 1951/03/22  Patient Care Team: Sylvan Evener, MD as PCP - General (Internal Medicine) Mealor, Donnamae Gaba, MD as Consulting Physician (Cardiology) Nicholas Bari, MD as Consulting Physician (Rheumatology)  Date of visit: 01/31/2024   ASSESSMENT & PLAN:   Julia Solis is a 73 y.o. lady with a past medical history of sarcoidosis, pulmonary hypertension, sick sinus syndrome, hypertension, osteoporosis, renal stones, history of stage I ovarian cancer diagnosed in 2011, in remission, degenerative joint disease, was referred to our service in October 2024 for evaluation of monoclonal gammopathy.  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  Monoclonal gammopathy of unknown significance Faint IgG lambda detected on serum immunoelectrophoresis on routine blood work done at her rheumatologist office.   -Discussed diagnosis, implications, plan of care. 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    -Discussed small risk of progression to smoldering myeloma or actual multiple myeloma and hence continued monitoring is recommended.    -To complete workup, on her initial consultation with us  on 08/03/2023, we obtained serum free light chains, repeat SPEP and obtain 24-hour urine protein electrophoresis and immunoelectrophoresis.  SPEP showed no evidence of M spike.  IFE showed faint IgG band.  Serum free kappa and lambda were close to normal with normal ratio.  24-hour urine protein electrophoresis also came back unremarkable.  No CRAB features.   -Recent myeloma labs on 01/20/2024 showed no evidence of M spike on SPE. IFE showed IgG lambda monoclonal protein, faint amount.  Quantitative immunoglobulins were within normal limits.  Serum  free kappa was 14.9 mg/L, serum free lambda 21.4 mg/L, ratio normal at 0.70.  Creatinine normal at 0.89, calcium  normal at 9.3.  Hemoglobin 12.8.  -Clinical picture remains consistent with MGUS.  Patient was provided reassurance.  Will continue surveillance.    Since labs remained stable overall, we will space out clinic intervals.  RTC in 6 months with labs 2 weeks prior.  History of ovarian cancer - Stage 1 disease, diagnosed in early 2011. S/p optimal surgical debulking (TAH/BSO, omentectomy, pelvic and periaortic LND) in March 2011 and 6 cycles of carbo/taxol in July 2011. She was undergoing surveillance since that time with no evidence of recurrence.  - Last seen by her oncologist at outside facility in January 2021.  CA-125 was 11, normal.  She was discharged from their office. -Clinically no signs or symptoms to suggest disease recurrence.  Routine imaging is not indicated as per NCCN guidelines.  She does get CT of the chest at her pulmonologist office for her history of sarcoidosis and usually they have been including CT abdomen pelvis for surveillance.  Seronegative rheumatoid arthritis (HCC) -Recently diagnosed.  Her rheumatologist started her on hydroxychloroquine  in November 2024.    I spent a total of 25 minutes during this encounter with the patient including review of chart and various tests results, discussions about plan of care and coordination of care plan.  I reviewed lab results and outside records for this visit and discussed relevant results with the patient. Diagnosis, plan of care and treatment options were also discussed in detail with the patient. Opportunity provided to ask questions and answers provided to her apparent satisfaction. Provided instructions to call our clinic with any problems, questions or concerns prior to return visit. I  recommended to continue follow-up with PCP and sub-specialists. She verbalized understanding and agreed with the plan. No barriers to  learning was detected.  Arlo Berber, MD  01/31/2024 2:10 PM  [facility]  HOSP[facility] [address] [place] [address] Dept: [phone] Dept Fax: [phone]   CHIEF COMPLAINT/ REASON FOR VISIT:  Follow-up for IgG lambda MGUS.  INTERVAL HISTORY:  Discussed the use of AI scribe software for clinical note transcription with the patient, who gave verbal consent to proceed.  History of Present Illness  She is currently on Plaquenil  for rheumatoid arthritis.  She still has arthritis issues and has continued follow-up with rheumatology clinic. Recent blood work showed normal blood counts, including white blood cells, hemoglobin at 12.8, and platelets. Kidney function tests, including creatinine and calcium , were normal. Her multiple myeloma panel showed no M spike, although it has fluctuated in the past. In January, the M protein was at 0.3 grams, but it is not currently observed. The immunofixation electrophoresis (IFE) showed a faint amount of IgG monoclonal protein with lambda light chain specificity, which was not picked up on the serum protein electrophoresis (SPEP). The kappa lambda ratio was also normal. She is asymptomatic with no signs of anemia or other complications related to multiple myeloma.  She has a history of ovarian cancer diagnosed in 2011 and has been receiving annual scans for surveillance. She plans to continue with these scans but is considering spacing them out due to concerns about radiation exposure from CT scans. She has been feeling unwell over the past year and wants to ensure she continues with her current monitoring schedule. No abdominal pain is reported, and she has been undergoing regular scans for her lungs and pelvis. She mentions being sick over the past year but does not specify the nature of the illness.  She has a friend with multiple myeloma and is aware of symptoms  like anemia causing shortness of breath and fatigue, but she does not report experiencing these symptoms herself.   SUMMARY OF HEMATOLOGIC HISTORY:  73 y.o. lady with a past medical history of sarcoidosis, pulmonary hypertension, sick sinus syndrome, hypertension, osteoporosis, renal stones, history of stage I ovarian cancer diagnosed in 2011, in remission, degenerative joint disease, was referred to our service for evaluation of monoclonal gammopathy.     Discussed the use of AI scribe software for clinical note transcription with the patient, who gave verbal consent to proceed.    She was recently referred to a rheumatologist by her PCP.  Her rheumatologist pursued workup for suspected polymyalgia rheumatica (PMR) or rheumatoid arthritis (RA).  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  Immunoelectrophoresis showed faint IgG lambda monoclonal protein.  Given this finding, she was referred to us  for further evaluation.     To complete workup, on her initial consultation with us  on 08/03/2023, we obtained serum free light chains, repeat SPEP and obtain 24-hour urine protein electrophoresis and immunoelectrophoresis.  SPEP showed no evidence of M spike.  IFE showed faint IgG band.  Serum free kappa and lambda were close to normal with normal ratio.  24-hour urine protein electrophoresis also came back unremarkable.  No CRAB features.    Myeloma labs on 10/22/2023 showed 0.3 g/dL of M spike on SPEP, IFE showed it to be IgG lambda.  Quantitative immunoglobulins are within normal limits.  Serum free kappa was 24.9 mg/L, serum free lambda 25.8  mg/L, ratio normal at 0.97.  Clinical picture remains consistent with MGUS.  Patient was provided reassurance.  Will continue surveillance.  Initially we rechecked labs in 3 months.  Since labs are stable overall, we will space her clinic intervals to every 6 to 12 months.  I have reviewed the past medical history, past surgical  history, social history and family history with the patient and they are unchanged from previous note.  ALLERGIES: She is allergic to augmentin  [amoxicillin -pot clavulanate], flomax [tamsulosin hcl], latex, tramadol , and ciprofloxacin.  MEDICATIONS:  Current Outpatient Medications  Medication Sig Dispense Refill   Cholecalciferol (VITAMIN D ) 50 MCG (2000 UT) tablet Take 2,000 Units by mouth daily.     famotidine (PEPCID) 20 MG tablet Take 20 mg by mouth at bedtime.     fluticasone (FLONASE) 50 MCG/ACT nasal spray Place into the nose 3 (three) times a week.     hydroxychloroquine  (PLAQUENIL ) 200 MG tablet TAKE 1 TABLET BY MOUTH TWICE A DAY ON MONDAY THROUGH FRIDAY ONLY. DO NOT TAKE SATURDAY AND SUNDAY 120 tablet 0   loratadine (CLARITIN) 10 MG tablet Take 10 mg by mouth daily.     LORazepam  (ATIVAN ) 1 MG tablet TAKE 1 TABLET BY MOUTH EVERY DAY AT BEDTIME AS NEEDED 90 tablet 0   losartan  (COZAAR ) 25 MG tablet TAKE 1 TABLET (25 MG TOTAL) BY MOUTH DAILY. 90 tablet 3   meloxicam  (MOBIC ) 15 MG tablet Take 1 tablet (15 mg total) by mouth as needed. PRN 30 tablet 0   Multiple Vitamin (MULTIVITAMIN WITH MINERALS) TABS tablet Take 1 tablet by mouth daily.     oxyCODONE  (OXY IR/ROXICODONE ) 5 MG immediate release tablet Take 5 mg by mouth daily as needed.     Polyethyl Glycol-Propyl Glycol (SYSTANE) 0.4-0.3 % GEL ophthalmic gel Place 1 application  into both eyes in the morning and at bedtime.     rosuvastatin  (CRESTOR ) 5 MG tablet TAKE 1 TABLET BY MOUTH 3 TIMES A WEEK WITH SUPPER 36 tablet 2   No current facility-administered medications for this visit.     REVIEW OF SYSTEMS:    ROS  All other pertinent systems were reviewed with the patient and are negative.  PHYSICAL EXAMINATION:  ECOG PERFORMANCE STATUS: 1 - Symptomatic but completely ambulatory  Vitals:   01/31/24 1135  BP: (!) 161/81  Pulse: 72  Resp: 18  Temp: 98.7 F (37.1 C)  SpO2: 100%   Filed Weights   01/31/24 1135   Weight: 171 lb (77.6 kg)    Physical Exam Constitutional:      General: She is not in acute distress.    Appearance: Normal appearance.  HENT:     Head: Normocephalic and atraumatic.  Eyes:     General: No scleral icterus.    Conjunctiva/sclera: Conjunctivae normal.  Cardiovascular:     Rate and Rhythm: Normal rate and regular rhythm.     Heart sounds: Normal heart sounds.  Pulmonary:     Effort: Pulmonary effort is normal.     Breath sounds: Normal breath sounds.  Abdominal:     General: There is no distension.  Musculoskeletal:     Right lower leg: No edema.     Left lower leg: No edema.  Neurological:     General: No focal deficit present.     Mental Status: She is alert and oriented to person, place, and time.  Psychiatric:        Mood and Affect: Mood normal.  Behavior: Behavior normal.        Thought Content: Thought content normal.     LABORATORY DATA:   I have reviewed the data as listed.  Recent Results (from the past 2160 hours)  CUP PACEART REMOTE DEVICE CHECK     Status: None   Collection Time: 11/11/23  8:43 AM  Result Value Ref Range   Pulse Generator Manufacturer SJ[employer]    Date Time Interrogation Session [id number]    Pulse Gen Model 2272 Assurity MRI    Pulse Gen Serial Number [id number]    Clinic Name [facility]    Implantable Pulse Generator Type Implantable Pulse Generator    Implantable Pulse Generator Implant Date [id number]    Implantable Lead Manufacturer [facility]    Implantable Lead Model LPA1200M Tendril MRI    Implantable Lead Serial Number [id number]    Implantable Lead Implant Date 95284132    Implantable Lead Location Detail 1 UNKNOWN    Implantable Lead Location [id number]    Implantable Lead Connection Status [id number]    Implantable Lead Manufacturer SJ[employer]    Implantable Lead Model LPA1200M Tendril MRI    Implantable Lead Serial Number [id number]    Implantable Lead Implant Date 44010272    Implantable Lead Location Detail 1  UNKNOWN    Implantable Lead Location [id number]    Implantable Lead Connection Status [id number]   HM MAMMOGRAPHY     Status: None   Collection Time: 12/07/23  9:35 AM  Result Value Ref Range   HM Mammogram 0-4 Bi-Rad 0-4 Bi-Rad, Self Reported Normal    Comment: Abstracted by HIM  Kappa/lambda light chains     Status: None   Collection Time: 01/20/24  1:10 PM  Result Value Ref Range   Kappa free light chain 14.9 3.3 - 19.4 mg/L   Lambda free light chains 21.4 5.7 - 26.3 mg/L   Kappa, lambda light chain ratio 0.70 0.26 - 1.65    Comment: (NOTE) Performed At: [facility] [address] [place], [place] [id number] Pearlean Botts MD VQ:[id number]   Multiple Myeloma Panel (SPEP&IFE w/QIG)     Status: Abnormal   Collection Time: 01/20/24  1:10 PM  Result Value Ref Range   IgG (Immunoglobin G), Serum 888 586 - 1,602 mg/dL   IgA 433 64 - 295 mg/dL   IgM (Immunoglobulin M), Srm 62 26 - 217 mg/dL   Total Protein ELP 5.7 (L) 6.0 - 8.5 g/dL   Albumin SerPl Elph-Mcnc 3.3 2.9 - 4.4 g/dL   Alpha 1 0.2 0.0 - 0.4 g/dL   Alpha2 Glob SerPl Elph-Mcnc 0.7 0.4 - 1.0 g/dL   B-Globulin SerPl Elph-Mcnc 0.9 0.7 - 1.3 g/dL   Gamma Glob SerPl Elph-Mcnc 0.7 0.4 - 1.8 g/dL   M Protein SerPl Elph-Mcnc Not Observed Not Observed g/dL   Globulin, Total 2.4 2.2 - 3.9 g/dL   Albumin/Glob SerPl 1.4 0.7 - 1.7   IFE 1 Comment (A)     Comment: (NOTE) Immunofixation shows IgG monoclonal protein with lambda light chain specificity.    Please Note Comment     Comment: (NOTE) Protein electrophoresis scan will follow via computer, mail, or courier delivery. Performed At: [facility] [address] [place], [place] [id number] Pearlean Botts MD TK:[id number]   Lactate dehydrogenase     Status: None   Collection Time: 01/20/24  1:10 PM  Result Value Ref Range   LDH 152 98 - 192 U/L    Comment: Performed at [facility], [address]  [place], [place] [address]  Comprehensive  metabolic panel     Status: Abnormal   Collection Time: 01/20/24  1:10 PM  Result Value Ref Range   Sodium 139 135 - 145 mmol/L   Potassium 4.1 3.5 - 5.1 mmol/L   Chloride 105 98 - 111 mmol/L   CO2 29 22 - 32 mmol/L   Glucose, Bld 101 (H) 70 - 99 mg/dL    Comment: Glucose reference range applies only to samples taken after fasting for at least 8 hours.   BUN 16 8 - 23 mg/dL   Creatinine, Ser 4.78 0.44 - 1.00 mg/dL   Calcium  9.3 8.9 - 10.3 mg/dL   Total Protein 6.2 (L) 6.5 - 8.1 g/dL   Albumin 3.7 3.5 - 5.0 g/dL   AST 15 15 - 41 U/L   ALT 9 0 - 44 U/L   Alkaline Phosphatase 68 38 - 126 U/L   Total Bilirubin 0.4 0.0 - 1.2 mg/dL   GFR, Estimated >29 >56 mL/min    Comment: (NOTE) Calculated using the CKD-EPI Creatinine Equation (2021)    Anion gap 5 5 - 15    Comment: Performed at [facility], [address] [place], [place] [address]  CBC with Differential/Platelet     Status: None   Collection Time: 01/20/24  1:10 PM  Result Value Ref Range   WBC 6.1 4.0 - 10.5 K/uL   RBC 4.49 3.87 - 5.11 MIL/uL   Hemoglobin 12.8 12.0 - 15.0 g/dL   HCT 65.7 84.6 - 96.2 %   MCV 88.9 80.0 - 100.0 fL   MCH 28.5 26.0 - 34.0 pg   MCHC 32.1 30.0 - 36.0 g/dL   RDW 95.2 84.1 - 32.4 %   Platelets 180 150 - 400 K/uL   nRBC 0.0 0.0 - 0.2 %   Neutrophils Relative % 59 %   Neutro Abs 3.6 1.7 - 7.7 K/uL   Lymphocytes Relative 26 %   Lymphs Abs 1.6 0.7 - 4.0 K/uL   Monocytes Relative 10 %   Monocytes Absolute 0.6 0.1 - 1.0 K/uL   Eosinophils Relative 4 %   Eosinophils Absolute 0.2 0.0 - 0.5 K/uL   Basophils Relative 1 %   Basophils Absolute 0.0 0.0 - 0.1 K/uL   Immature Granulocytes 0 %   Abs Immature Granulocytes 0.01 0.00 - 0.07 K/uL    Comment: Performed at [facility], [address] [place], [place] [address]     RADIOGRAPHIC STUDIES:  No recent pertinent imaging studies available to review.  Orders Placed This Encounter  Procedures   Multiple  Myeloma Panel (SPEP&IFE w/QIG)    Standing Status:   Future    Expected Date:   08/01/2024    Expiration Date:   01/30/2025   Kappa/lambda light chains    Standing Status:   Future    Expected Date:   08/01/2024    Expiration Date:   01/30/2025   CBC with Differential (Cancer Center Only)    Standing Status:   Future    Expected Date:   08/01/2024    Expiration Date:   01/30/2025   CMP (Cancer Center only)    Standing Status:   Future    Expected Date:   08/01/2024    Expiration Date:   01/30/2025   C-reactive protein    Standing Status:   Future    Expected Date:   08/01/2024    Expiration Date:   01/30/2025   Sedimentation rate  Standing Status:   Future    Expected Date:   08/01/2024    Expiration Date:   01/30/2025     Future Appointments  Date Time Provider Department Center  02/10/2024  7:00 AM CVD HVT DEVICE REMOTES [facility]  02/22/2024  1:00 PM MC-MR 1 [facility]  02/24/2024 11:00 AM Nicholas Bari, MD [facility] None  03/27/2024  2:30 PM Mealor, Donnamae Gaba, MD [facility]  04/26/2024 12:50 PM Romayne Clubs, PA-C [facility] None  05/11/2024  7:00 AM CVD HVT DEVICE REMOTES [facility]  07/04/2024  1:00 PM [facility] [facility] None  08/10/2024  7:00 AM CVD HVT DEVICE REMOTES [facility]  08/22/2024  9:30 AM M[facility]-LAB [facility]  08/24/2024 11:00 AM Baxley, Jaynie Meyers, MD [facility]     This document was completed utilizing speech recognition software. Grammatical errors, random word insertions, pronoun errors, and incomplete sentences are an occasional consequence of this system due to software limitations, ambient noise, and hardware issues. Any formal questions or concerns about the content, text or information contained within the body of this dictation should be directly addressed to the provider for clarification.

## 2024-01-31 NOTE — Assessment & Plan Note (Addendum)
 Faint IgG lambda detected on serum immunoelectrophoresis on routine blood work done at her rheumatologist office.   -Discussed diagnosis, implications, plan of care. 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    -Discussed small risk of progression to smoldering myeloma or actual multiple myeloma and hence continued monitoring is recommended.    -To complete workup, on her initial consultation with us  on 08/03/2023, we obtained serum free light chains, repeat SPEP and obtain 24-hour urine protein electrophoresis and immunoelectrophoresis.  SPEP showed no evidence of M spike.  IFE showed faint IgG band.  Serum free kappa and lambda were close to normal with normal ratio.  24-hour urine protein electrophoresis also came back unremarkable.  No CRAB features.   -Recent myeloma labs on 01/20/2024 showed no evidence of M spike on SPE. IFE showed IgG lambda monoclonal protein, faint amount.  Quantitative immunoglobulins were within normal limits.  Serum free kappa was 14.9 mg/L, serum free lambda 21.4 mg/L, ratio normal at 0.70.  Creatinine normal at 0.89, calcium  normal at 9.3.  Hemoglobin 12.8.  -Clinical picture remains consistent with MGUS.  Patient was provided reassurance.  Will continue surveillance.    Since labs remained stable overall, we will space out clinic intervals.  RTC in 6 months with labs 2 weeks prior.

## 2024-02-02 ENCOUNTER — Other Ambulatory Visit: Payer: Self-pay | Admitting: Physician Assistant

## 2024-02-02 DIAGNOSIS — M0600 Rheumatoid arthritis without rheumatoid factor, unspecified site: Secondary | ICD-10-CM

## 2024-02-02 NOTE — Telephone Encounter (Signed)
 Last Fill: 11/19/2023  Eye exam: 09/22/2023 normal    Labs: 01/20/2024 Glucose 101, Total Protein 6.2  Next Visit: 02/24/2024  Last Visit: 01/24/2024  [id number] rheumatoid arthritis   Current Dose per office note 01/24/2024: Plaquenil  200 mg 1 tablet by mouth twice daily Monday to Friday   Okay to refill Plaquenil ?

## 2024-02-03 ENCOUNTER — Encounter: Payer: Self-pay | Admitting: Internal Medicine

## 2024-02-03 ENCOUNTER — Telehealth: Payer: Self-pay | Admitting: *Deleted

## 2024-02-03 NOTE — Telephone Encounter (Signed)
 Spoke to patient, scheduled patient for office visit 02/22/2024. Informed patient of blood pressure reading numbers and advised to get a reading first thing when she wakes up, and at least once more in the evening. Let her know if her numbers are very elevated to go to the emergency department or urgent care. Patient stated she will write the numbers down and bring them with her during her visit.

## 2024-02-03 NOTE — Telephone Encounter (Signed)
 Patient contacted the office and states she will not be seeing oncology until October. Patient states she is due to see us  in July and wants to know if she will needs labs prior to that appointment. Patient advised she will need labs every 5 months and her next set is due September 2025.

## 2024-02-08 ENCOUNTER — Ambulatory Visit (INDEPENDENT_AMBULATORY_CARE_PROVIDER_SITE_OTHER): Admitting: Internal Medicine

## 2024-02-08 ENCOUNTER — Encounter: Payer: Self-pay | Admitting: Internal Medicine

## 2024-02-08 ENCOUNTER — Ambulatory Visit: Payer: Self-pay

## 2024-02-08 VITALS — BP 150/100 | HR 92 | Ht 64.0 in | Wt 168.0 lb

## 2024-02-08 DIAGNOSIS — H905 Unspecified sensorineural hearing loss: Secondary | ICD-10-CM | POA: Diagnosis not present

## 2024-02-08 DIAGNOSIS — R030 Elevated blood-pressure reading, without diagnosis of hypertension: Secondary | ICD-10-CM | POA: Diagnosis not present

## 2024-02-08 DIAGNOSIS — Z8543 Personal history of malignant neoplasm of ovary: Secondary | ICD-10-CM

## 2024-02-08 DIAGNOSIS — M0600 Rheumatoid arthritis without rheumatoid factor, unspecified site: Secondary | ICD-10-CM | POA: Diagnosis not present

## 2024-02-08 DIAGNOSIS — D860 Sarcoidosis of lung: Secondary | ICD-10-CM

## 2024-02-08 DIAGNOSIS — Z8679 Personal history of other diseases of the circulatory system: Secondary | ICD-10-CM

## 2024-02-08 MED ORDER — OXYCODONE-ACETAMINOPHEN 10-325 MG PO TABS: 1.0000 | ORAL_TABLET | Freq: Three times a day (TID) | ORAL | 0 refills | Status: AC | PRN

## 2024-02-08 MED ORDER — AMOXICILLIN 500 MG PO CAPS: 500.0000 mg | ORAL_CAPSULE | Freq: Three times a day (TID) | ORAL | 0 refills | Status: AC

## 2024-02-08 MED ORDER — LOSARTAN POTASSIUM 50 MG PO TABS: 50.0000 mg | ORAL_TABLET | Freq: Every day | ORAL | 0 refills | Status: DC

## 2024-02-08 NOTE — Progress Notes (Addendum)
 Patient Care Team: Sylvan Evener, MD as PCP - General (Internal Medicine) Mealor, Donnamae Gaba, MD as Consulting Physician (Cardiology) Nicholas Bari, MD as Consulting Physician (Rheumatology)  Visit Date: 02/08/24  Subjective:   Chief Complaint  Patient presents with   Hypertension   Vitals:   02/08/24 1422 02/08/24 1444 02/08/24 1445  BP: (!) 150/90 (!) 166/104 (!) 150/100   Patient Julia Solis Anne Warmack,Female DOB:October 17, 1950,73 y.o. WUJ:[id number]   73 y.o. Female presents today for acute visit with Elevated Blood Pressure. Past medical history of Rheumatoid Arthritis managed with Plaquenil  200 mg twice daily except on Saturday & Sunday. Takes Losartan  25 mg daily. Blood pressure is 150/90 on initial check, on recheck 166/104 & 150/100. Says her blood pressures at home have been ranging 114-166 systolic, 82-103 diastolic, and heart rate 68-100. Denies chest pain or edema. Also says that her pain currently doesn't feel well managed, today took Tylenol  1000 mg & Melxicam 7.5 mg, which may be contributing to her high blood pressure. Mentions that she has noticed her heart racing while at rest or after very little exertion. 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   Complication of anesthesia    GERD (gastroesophageal reflux disease)    Hearing aid worn    both ears   History of chemotherapy 2011   History of hiatal hernia    History of kidney stones    Hypertension    MGUS (monoclonal gammopathy of unknown significance)    dx by hematology per patient   Neuromuscular disorder (HCC)    left foot numbness since  eback surgery   Osteoporosis    PONV (postoperative nausea and vomiting)    Presence of permanent cardiac pacemaker    Sarcoidosis    in remission sees pumonary  once a year dr bellinger lov 08-21-2020 care everywhere   Sick sinus syndrome (HCC)    Wears glasses     Allergies  Allergen Reactions   Augmentin  [Amoxicillin -Pot  Clavulanate]     Diarrhea    Flomax [Tamsulosin Hcl] Other (See Comments)    Dizziness   Latex Other (See Comments)    Skin irritation   Tramadol  Other (See Comments)    "jittery", insomnia   Ciprofloxacin Rash    Family History  Problem Relation Age of Onset   Hypertension Mother    Prostate cancer Father 62   Cancer Father    Hypertension Father    Stroke Father    Colon polyps Sister    Colon cancer Maternal Grandmother        dx in her 10s   Heart disease Maternal Grandfather    Colon cancer Paternal Grandmother    Heart disease Paternal Grandfather    Breast cancer Paternal Aunt 31   Colon cancer Paternal Aunt        dx in her 62s   Melanoma Cousin 87   Breast cancer Cousin 62       multiple cousins   Colon polyps Cousin    Breast cancer Cousin 62   Breast cancer Cousin        father's maternal 1st cousin   Breast cancer Cousin        Father's first cousin's daughter   Cancer Other        maternal great uncle with Cancer - NOS   Cancer Other        maternal great grandmother with either colon or stomach cancer   Breast cancer  Other        fathers maternal aunt   Social History   Social History Narrative   Not on file   Review of Systems  Cardiovascular:  Negative for chest pain and leg swelling.       (+) Tachycardia at rest (+) Elevated BP     Objective:  Vitals: BP (!) 150/100 (BP Location: Left Arm, Patient Position: Sitting, Cuff Size: Normal)   Pulse 92   Ht 5\' 4"  (1.626 m)   Wt 168 lb (76.2 kg)   SpO2 97%   BMI 28.84 kg/m   Physical Exam Vitals and nursing note reviewed.  Constitutional:      General: She is not in acute distress.    Appearance: Normal appearance. She is not toxic-appearing.  HENT:     Head: Normocephalic and atraumatic.  Pulmonary:     Effort: Pulmonary effort is normal.  Skin:    General: Skin is warm and dry.  Neurological:     Mental Status: She is alert and oriented to person, place, and time. Mental status is  at baseline.  Psychiatric:        Mood and Affect: Mood normal.        Behavior: Behavior normal.        Thought Content: Thought content normal.        Judgment: Judgment normal.     Results:  Studies Obtained And Personally Reviewed By Me:  Date Blood Pressure Heart Rate  4/22 131/92 75  4/23 129/88 82  4/24 130/82 100  4/25 144/84 77  4/26 114/84 81  4/28 137/89 74  4/29 149/84 73  4/30 146/87 77  5/01 148/94 72  5/02 149/86 72  5/04 156/103 72  5/05 158/82 68  5/06 166/92 72   Labs:     Component Value Date/Time   NA 139 01/20/2024 1310   NA 144 10/28/2020 1636   K 4.1 01/20/2024 1310   CL 105 01/20/2024 1310   CO2 29 01/20/2024 1310   GLUCOSE 101 (H) 01/20/2024 1310   BUN 16 01/20/2024 1310   BUN 16 10/28/2020 1636   CREATININE 0.89 01/20/2024 1310   CREATININE 0.88 10/20/2023 1056   CALCIUM  9.3 01/20/2024 1310   PROT 6.2 (L) 01/20/2024 1310   ALBUMIN 3.7 01/20/2024 1310   AST 15 01/20/2024 1310   ALT 9 01/20/2024 1310   ALKPHOS 68 01/20/2024 1310   BILITOT 0.4 01/20/2024 1310   GFRNONAA >60 01/20/2024 1310   GFRNONAA 82 07/11/2020 0949   GFRAA 93 10/28/2020 1636   GFRAA 95 07/11/2020 0949    Lab Results  Component Value Date   WBC 6.1 01/20/2024   HGB 12.8 01/20/2024   HCT 39.9 01/20/2024   MCV 88.9 01/20/2024   PLT 180 01/20/2024   Lab Results  Component Value Date   CHOL 210 (H) 08/20/2023   HDL 73 08/20/2023   LDLCALC 117 (H) 08/20/2023   TRIG 95 08/20/2023   CHOLHDL 2.9 08/20/2023   Lab Results  Component Value Date   HGBA1C 5.4 08/20/2023    Lab Results  Component Value Date   TSH 3.04 08/20/2023    Assessment & Plan:   Elevated Blood Pressure:  Currently takes Losartan  25 mg daily.  Says this was prescribed initially for renal protection a number of years ago.Blood pressure is 150/90 on initial check, on recheck 166/104 & 150/100. Blood pressure readings at home have been ranging 114-166 systolic, 82-103 diastolic, and heart  rate 68-100.  Denies chest pain or edema, though mentioned that she has noticed her heart racing while at rest or after very little exertion.May now be developing essential HTN. No evidence of CHF today on exam.Denies being under stress.   Seronegative Rheumatoid Arthritis managed with Plaquenil  200 mg twice daily except on Saturday & Sunday. Doesn't feel that her pain is currently well managed, today took Tylenol  1000 mg & Melxicam 7.5 mg, which may be contributing to her high blood pressure. PDMP reviewed: 20, sending in Oxycodone -Acetaminophen  10-325 mg to take as need for musculoskeletal pain.   Sarcoidosis followed by [facility], dx in 2019  MGUS  Sick sinus syndrome - has pacemaker  Remote hx of ovarian cancer s/p surgery 2011. Negative lymph nodes  Hearing loss treated with hearing aids   Travel advice encounter: Sending in 500 mg Amoxicillin  - take 1 capsule (500 mg total) by mouth 3 (three) times daily for 10 days if needed for respiratory infection while out of town. Has upcoming out of state travel planned.  Continue to monitor BP. Can increase losartan  to 50 mg if necessary.     I,Emily Lagle,acting as a Neurosurgeon for Sylvan Evener, MD.,have documented all relevant documentation on the behalf of Sylvan Evener, MD,as directed by  Sylvan Evener, MD while in the presence of Sylvan Evener, MD.   I, Sylvan Evener, MD, have reviewed all documentation for this visit. The documentation on 02/13/24 for the exam, diagnosis, procedures, and orders are all accurate and complete.

## 2024-02-08 NOTE — Telephone Encounter (Signed)
 Copied from CRM [id number]. Topic: Clinical - Red Word Triage >> Feb 08, 2024 10:55 AM Elle L wrote: Red Word that prompted transfer to Nurse Triage: The patient's blood pressure has been worsening although her high blood pressure medications have not changed. It was 166/92 this morning and she got out of the shower and she feels short of breath and unwell. Her oxygen  is okay but her heart rate is 118-119.    Chief Complaint: BP 166/92, feels tired, weak, unwell. Taking medications as prescribed.  Symptoms: Above Frequency: weeks Pertinent Negatives: Patient denies chest pain or SOB Disposition: [] ED /[] Urgent Care (no appt availability in office) / [x] Appointment(In office/virtual)/ []  [facility]/ [] Home Care/ [] Refused Recommended Disposition /[] [facility]/ []  Follow-up with PCP Additional Notes: Will go to ED for worsening of symptoms. Agrees with appointment.  Reason for Disposition  Systolic BP  >= 180 OR Diastolic >= 110  Answer Assessment - Initial Assessment Questions 1. BLOOD PRESSURE: "What is the blood pressure?" "Did you take at least two measurements 5 minutes apart?"     166/92  pulse 118 2. ONSET: "When did you take your blood pressure?"     today 3. HOW: "How did you take your blood pressure?" (e.g., automatic home BP monitor, visiting nurse)     Home cuff 4. HISTORY: "Do you have a history of high blood pressure?"     Yes 5. MEDICINES: "Are you taking any medicines for blood pressure?" "Have you missed any doses recently?"     Yes 6. OTHER SYMPTOMS: "Do you have any symptoms?" (e.g., blurred vision, chest pain, difficulty breathing, headache, weakness)     Weak, unwell 7. PREGNANCY: "Is there any chance you are pregnant?" "When was your last menstrual period?"     no  Protocols used: Blood Pressure - High-A-AH

## 2024-02-10 ENCOUNTER — Ambulatory Visit (INDEPENDENT_AMBULATORY_CARE_PROVIDER_SITE_OTHER): Payer: PPO

## 2024-02-10 DIAGNOSIS — I495 Sick sinus syndrome: Secondary | ICD-10-CM | POA: Diagnosis not present

## 2024-02-13 NOTE — Patient Instructions (Signed)
 We have sent in a prescription for losartan  for 25 mg to add to your current dose of 25 mg daily if your blood pressure stays elevated over the next several days.  Have sent in amoxicillin  to have on hand should you develop a respiratory infection while traveling.  Continue other medications as previously prescribed.

## 2024-02-14 LAB — CUP PACEART REMOTE DEVICE CHECK
Date Time Interrogation Session: 20250508100156
Implantable Lead Connection Status: 753985
Implantable Lead Connection Status: 753985
Implantable Lead Implant Date: 20220201
Implantable Lead Implant Date: 20220201
Implantable Lead Location: 753859
Implantable Lead Location: 753860
Implantable Pulse Generator Implant Date: 20220201
Pulse Gen Model: 2272
Pulse Gen Serial Number: 3896658

## 2024-02-15 ENCOUNTER — Other Ambulatory Visit (HOSPITAL_COMMUNITY)

## 2024-02-18 ENCOUNTER — Ambulatory Visit: Payer: Self-pay | Admitting: Cardiovascular Disease

## 2024-02-21 NOTE — Progress Notes (Signed)
 Patient Care Team: Sylvan Evener, MD as PCP - General (Internal Medicine) Mealor, Donnamae Gaba, MD as Consulting Physician (Cardiology) Nicholas Bari, MD as Consulting Physician (Rheumatology)  Visit Date: 02/22/24  Subjective:   Chief Complaint  Patient presents with   Blood Pressure Check   Vitals:   02/22/24 1518  BP: (!) 150/90   Patient ZO:XWRUEAV Anne Crockett,Female DOB:May 08, 1951,73 y.o. WUJ:[id number]   73 y.o.Female presents today for 2 week follow-up for Elevated BP. Patient has a past medical history of RA managed with Plaquenil  200 mg twice daily sans Sat & Sun. Last seen 02/08/2024 for Elevated Blood Pressure with at-home readings 114-166 systolic & 83-103 diastolic with heart rates 68-100. Denied chest pain or LE edema, but mentioned having palpitations while at rest or with minimal exertion. Losartan  was increased to 50 mg daily. She also said in that visit that she didn't feel like her pain was well-managed as she'd had been taking Tylenol  1000 mg and Meloxicam  7.5 mg in addition to her Plaquenil . Says that while on vacation, she'd been fine though did have some LE swelling after walking over the course of the day, which improved after taking Lasix . She also notes that her pain tends to be worse in the mornings, which is when she takes her blood pressure. 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   Complication of anesthesia    GERD (gastroesophageal reflux disease)    Hearing aid worn    both ears   History of chemotherapy 2011   History of hiatal hernia    History of kidney stones    Hypertension    MGUS (monoclonal gammopathy of unknown significance)    dx by hematology per patient   Neuromuscular disorder (HCC)    left foot numbness since  eback surgery   Osteoporosis    PONV (postoperative nausea and vomiting)    Presence of permanent cardiac pacemaker    Sarcoidosis    in remission sees pumonary  once a year dr bellinger lov  08-21-2020 care everywhere   Sick sinus syndrome (HCC)    Wears glasses     Allergies  Allergen Reactions   Augmentin  [Amoxicillin -Pot Clavulanate]     Diarrhea    Flomax [Tamsulosin Hcl] Other (See Comments)    Dizziness   Latex Other (See Comments)    Skin irritation   Tramadol  Other (See Comments)    "jittery", insomnia   Ciprofloxacin Rash    Family History  Problem Relation Age of Onset   Hypertension Mother    Prostate cancer Father 39   Cancer Father    Hypertension Father    Stroke Father    Colon polyps Sister    Colon cancer Maternal Grandmother        dx in her 39s   Heart disease Maternal Grandfather    Colon cancer Paternal Grandmother    Heart disease Paternal Grandfather    Breast cancer Paternal Aunt 67   Colon cancer Paternal Aunt        dx in her 5s   Melanoma Cousin 24   Breast cancer Cousin 5       multiple cousins   Colon polyps Cousin    Breast cancer Cousin 67   Breast cancer Cousin        father's maternal 1st cousin   Breast cancer Cousin        Father's first cousin's daughter   Cancer Other  maternal great uncle with Cancer - NOS   Cancer Other        maternal great grandmother with either colon or stomach cancer   Breast cancer Other        fathers maternal aunt   Social Hx: Retired. Previously worked for [employer]. Resides alone.  Review of Systems  Cardiovascular:  Positive for leg swelling (trace, improved with Lasix ).       (+) Elevated BP     Objective:  Vitals: BP (!) 150/90   Pulse 96   Ht 5\' 4"  (1.626 m)   Wt 168 lb (76.2 kg)   SpO2 96%   BMI 28.84 kg/m   Physical Exam Vitals and nursing note reviewed.  Constitutional:      General: She is not in acute distress.    Appearance: Normal appearance. She is not toxic-appearing.  HENT:     Head: Normocephalic and atraumatic.  Cardiovascular:     Rate and Rhythm: Normal rate and regular rhythm. No extrasystoles are present.    Pulses: Normal pulses.      Heart sounds: Normal heart sounds. No murmur heard.    No friction rub. No gallop.  Pulmonary:     Effort: Pulmonary effort is normal. No respiratory distress.     Breath sounds: Normal breath sounds. No wheezing or rales.  Musculoskeletal:     Right lower leg: Edema (trace, non-pitting) present.     Left lower leg: Edema (trace, non-pitting) present.  Skin:    General: Skin is warm and dry.  Neurological:     Mental Status: She is alert and oriented to person, place, and time. Mental status is at baseline.  Psychiatric:        Mood and Affect: Mood normal.        Behavior: Behavior normal.        Thought Content: Thought content normal.        Judgment: Judgment normal.     Results:  Studies Obtained And Personally Reviewed By Me:  Blood Pressure Log, copied from 5/06: Date Blood Pressure Heart Rate  4/22 131/92 75  4/23 129/88 82  4/24 130/82 100  4/25 144/84 77  4/26 114/84 81  4/28 137/89 74  4/29 149/84 73  4/30 146/87 77  5/01 148/94 72  5/02 149/86 72  5/04 156/103 72  5/05 158/82 68  5/06 166/92 72   Updated Blood Log 5/7 - 5/20: Date Blood Pressure Heart Rate  5/07 143/89 67  5/08 156/84 81  5/09 143/89 76  5/10 138/80 75  5/11 155/87 79  5/13 153/91 85  5/14 139/91 84  5/17 127/86 85  5/19 148/95 101  5/20 160/92 146/87 ten minutes later 79 73 ten minutes later  Labs:     Component Value Date/Time   NA 139 01/20/2024 1310   NA 144 10/28/2020 1636   K 4.1 01/20/2024 1310   CL 105 01/20/2024 1310   CO2 29 01/20/2024 1310   GLUCOSE 101 (H) 01/20/2024 1310   BUN 16 01/20/2024 1310   BUN 16 10/28/2020 1636   CREATININE 0.89 01/20/2024 1310   CREATININE 0.88 10/20/2023 1056   CALCIUM  9.3 01/20/2024 1310   PROT 6.2 (L) 01/20/2024 1310   ALBUMIN 3.7 01/20/2024 1310   AST 15 01/20/2024 1310   ALT 9 01/20/2024 1310   ALKPHOS 68 01/20/2024 1310   BILITOT 0.4 01/20/2024 1310   GFRNONAA >60 01/20/2024 1310   GFRNONAA 82 07/11/2020 0949   GFRAA 93  10/28/2020 1636   GFRAA 95 07/11/2020 0949    Lab Results  Component Value Date   WBC 6.1 01/20/2024   HGB 12.8 01/20/2024   HCT 39.9 01/20/2024   MCV 88.9 01/20/2024   PLT 180 01/20/2024   Lab Results  Component Value Date   CHOL 210 (H) 08/20/2023   HDL 73 08/20/2023   LDLCALC 117 (H) 08/20/2023   TRIG 95 08/20/2023   CHOLHDL 2.9 08/20/2023   Lab Results  Component Value Date   HGBA1C 5.4 08/20/2023    Lab Results  Component Value Date   TSH 3.04 08/20/2023   Assessment & Plan:   Elevated Blood Pressure: last seen 02/08/2024 with at-home readings 114-166 systolic & 83-103 diastolic with heart rates 68-100. While on vacation, she did have some LE swelling after walking over the course of the day, which improved after taking Lasix . She also notes that her pain tends to be worse in the mornings, which is when she takes her blood pressure. Blood pressure today in office is 150/90 with additional two at-home readings prior to visit 160/92 and 146/87 ten minutes later.  Has trace bilateral LE edema. Continue to monitor. Elevate legs when resting at home.   Instructed her to increase Losartan  to 100 mg and relay at-home readings via MyChart. Also recommended she take her blood pressure at a different point in the day when pain is not as severe.   Rheumatoid Arthritis managed with Plaquenil  200 mg twice daily sans Sat & Sun.  Colonoscopy 2023 with repeat recommended 2026 - she wll call [facility] to schedule this.     I,Emily Lagle,acting as a Neurosurgeon for Sylvan Evener, MD.,have documented all relevant documentation on the behalf of Sylvan Evener, MD,as directed by  Sylvan Evener, MD while in the presence of Sylvan Evener, MD.   I, Sylvan Evener, MD, have reviewed all documentation for this visit. The documentation on 03/01/24 for the exam, diagnosis, procedures, and orders are all accurate and complete.

## 2024-02-22 ENCOUNTER — Ambulatory Visit (HOSPITAL_COMMUNITY)
Admission: RE | Admit: 2024-02-22 | Discharge: 2024-02-22 | Disposition: A | Discharge status: Home / Self Care | Source: Ambulatory Visit | Attending: Orthopedic Surgery | Admitting: Orthopedic Surgery

## 2024-02-22 ENCOUNTER — Encounter: Payer: Self-pay | Admitting: Internal Medicine

## 2024-02-22 ENCOUNTER — Ambulatory Visit (INDEPENDENT_AMBULATORY_CARE_PROVIDER_SITE_OTHER): Admitting: Internal Medicine

## 2024-02-22 ENCOUNTER — Encounter: Payer: Self-pay | Admitting: Rheumatology

## 2024-02-22 VITALS — BP 150/90 | HR 96 | Ht 64.0 in | Wt 168.0 lb

## 2024-02-22 DIAGNOSIS — M5116 Intervertebral disc disorders with radiculopathy, lumbar region: Secondary | ICD-10-CM | POA: Diagnosis not present

## 2024-02-22 DIAGNOSIS — R600 Localized edema: Secondary | ICD-10-CM

## 2024-02-22 DIAGNOSIS — M48061 Spinal stenosis, lumbar region without neurogenic claudication: Secondary | ICD-10-CM | POA: Diagnosis not present

## 2024-02-22 DIAGNOSIS — M5117 Intervertebral disc disorders with radiculopathy, lumbosacral region: Secondary | ICD-10-CM | POA: Diagnosis not present

## 2024-02-22 DIAGNOSIS — Z8679 Personal history of other diseases of the circulatory system: Secondary | ICD-10-CM | POA: Diagnosis not present

## 2024-02-22 DIAGNOSIS — R030 Elevated blood-pressure reading, without diagnosis of hypertension: Secondary | ICD-10-CM

## 2024-02-22 DIAGNOSIS — M0600 Rheumatoid arthritis without rheumatoid factor, unspecified site: Secondary | ICD-10-CM | POA: Diagnosis not present

## 2024-02-22 DIAGNOSIS — M5416 Radiculopathy, lumbar region: Secondary | ICD-10-CM | POA: Insufficient documentation

## 2024-02-22 DIAGNOSIS — Z862 Personal history of diseases of the blood and blood-forming organs and certain disorders involving the immune mechanism: Secondary | ICD-10-CM

## 2024-02-22 DIAGNOSIS — M4807 Spinal stenosis, lumbosacral region: Secondary | ICD-10-CM | POA: Diagnosis not present

## 2024-02-22 NOTE — Telephone Encounter (Signed)
 Okay to take a missed dose on the weekend if needed.  The goal is to get a total of 10 doses of Plaquenil  per week.

## 2024-02-22 NOTE — CV Procedure (Signed)
  Device system confirmed to be MRI conditional, with implant date > 6 weeks ago, and no evidence of abandoned or epicardial leads in review of most recent CXR  Device last cleared by EP Provider: Valeri Gate, PA. On 02/17/24 Clearance is good through for 1 year as long as parameters remain stable at time of check. If pt undergoes a cardiac device procedure during that time, they should be re-cleared.   Tachy-therapies to be programmed off if applicable with device back to pre-MRI settings after completion of exam.  [employer] will be present for programming for the MRI.   Jackelyn Marvel, RT  02/22/2024 4:59 PM

## 2024-02-24 ENCOUNTER — Ambulatory Visit: Attending: Rheumatology | Admitting: Rheumatology

## 2024-02-24 ENCOUNTER — Ambulatory Visit

## 2024-02-24 DIAGNOSIS — M79641 Pain in right hand: Secondary | ICD-10-CM | POA: Diagnosis not present

## 2024-02-24 DIAGNOSIS — M79642 Pain in left hand: Secondary | ICD-10-CM

## 2024-02-24 NOTE — Progress Notes (Signed)
 Office Visit Note  Patient: Julia Solis             Date of Birth: 1950/10/08           MRN: [id number]             PCP: Sylvan Evener, MD Referring: Sylvan Evener, MD Visit Date: 02/25/2024 Occupation: @GUAROCC @  Subjective:  Discuss treatment options   History of Present Illness: Railee Bonillas is a 73 y.o. female with history of rheumatoid arthritis.  Patient is currently taking Plaquenil  200 mg 1 tablet by mouth twice daily Monday through Friday.  She is tolerating Plaquenil  without any side effects.  She has also been taking meloxicam  15 mg 1 tablet daily but has not noticed any clinical improvement and plans to discontinue.  Patient had an ultrasound of both hands yesterday which was positive for synovitis.  She presents today to discuss treatment options.  She continues to have pain and stiffness involving the shoulders, both hands, and both knee joints.  Patient states that she will be seeing a back specialist next Friday.  Patient states that she has been taking Fosamax 70 mg 1 tablet by mouth once weekly.  She has noticed some increased gas since initiating Fosamax but has otherwise been tolerating it without any side effects.   Activities of Daily Living:  Patient reports morning stiffness for 2-3 hours.   Patient Reports nocturnal pain.  Difficulty dressing/grooming: Reports Difficulty climbing stairs: Reports Difficulty getting out of chair: Reports Difficulty using hands for taps, buttons, cutlery, and/or writing: Reports  Review of Systems  Constitutional:  Positive for fatigue.  HENT:  Negative for mouth sores and mouth dryness.   Eyes:  Negative for dryness.  Respiratory:  Positive for shortness of breath.   Cardiovascular:  Negative for chest pain and palpitations.  Gastrointestinal:  Negative for blood in stool, constipation and diarrhea.  Endocrine: Negative for increased urination.  Genitourinary:  Negative for involuntary urination.   Musculoskeletal:  Positive for joint pain, gait problem, joint pain, joint swelling and morning stiffness. Negative for myalgias, muscle weakness, muscle tenderness and myalgias.  Skin:  Positive for color change and rash. Negative for hair loss and sensitivity to sunlight.  Allergic/Immunologic: Negative for susceptible to infections.  Neurological:  Negative for dizziness and headaches.  Hematological:  Negative for swollen glands.  Psychiatric/Behavioral:  Negative for depressed mood and sleep disturbance. The patient is not nervous/anxious.     PMFS History:  Patient Active Problem List   Diagnosis Date Noted   Acute recurrent maxillary sinusitis 12/14/2023   Deviated nasal septum 12/14/2023   Hypertrophy of nasal turbinates 12/14/2023   Seronegative rheumatoid arthritis (HCC) 08/23/2023   Monoclonal gammopathy of unknown significance 08/03/2023   Primary osteoarthritis involving multiple joints 08/03/2023   Edema 06/26/2023   Hand arthritis 04/27/2023   Symptomatic sinus bradycardia 03/20/2022   Pacemaker 03/20/2022   Dry eye syndrome 10/16/2015   NS (nuclear sclerosis) 10/16/2015   Dystrophy, 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   Dry eye syndrome of both lacrimal glands 10/16/2015   Elevated LDL cholesterol level 02/26/2015   Back pain 02/20/2014   Insomnia 02/20/2014   Allergic rhinitis 02/20/2014   Osteopenia 07/07/2012   Fatigue 08/17/2011   Nonspecific (abnormal) findings on radiological and other examination of body structure 06/24/2010   ABNORMAL LUNG XRAY 06/24/2010   History of ovarian cancer 06/23/2010    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  Complication of anesthesia    GERD (gastroesophageal reflux disease)    Hearing aid worn    both ears   History of chemotherapy 2011   History of hiatal hernia    History of kidney stones    Hypertension    MGUS (monoclonal gammopathy of unknown significance)    dx by hematology per  patient   Neuromuscular disorder (HCC)    left foot numbness since  eback surgery   Osteoporosis    PONV (postoperative nausea and vomiting)    Presence of permanent cardiac pacemaker    Sarcoidosis    in remission sees pumonary  once a year dr bellinger lov 08-21-2020 care everywhere   Sick sinus syndrome (HCC)    Wears glasses     Family History  Problem Relation Age of Onset   Hypertension Mother    Prostate cancer Father 10   Cancer Father    Hypertension Father    Stroke Father    Colon polyps Sister    Colon cancer Maternal Grandmother        dx in her 76s   Heart disease Maternal Grandfather    Colon cancer Paternal Grandmother    Heart disease Paternal Grandfather    Breast cancer Paternal Aunt 50   Colon cancer Paternal Aunt        dx in her 80s   Melanoma Cousin 30   Breast cancer Cousin 45       multiple cousins   Colon polyps Cousin    Breast cancer Cousin 73   Breast cancer Cousin        father's maternal 1st cousin   Breast cancer Cousin        Father's first cousin's daughter   Cancer Other        maternal great uncle with Cancer - NOS   Cancer Other        maternal great grandmother with either colon or stomach cancer   Breast cancer Other        fathers maternal aunt   Past Surgical History:  Procedure Laterality Date   ABDOMINAL HYSTERECTOMY  2011   APPENDECTOMY  2011    ?with hysterectomy   BACK SURGERY  1997   lumbar   CARPAL TUNNEL RELEASE Right    CATARACT EXTRACTION Right 08/10/2023   CATARACT EXTRACTION Left 08/24/2023   CESAREAN SECTION  1982, 1985   COSMETIC SURGERY  2007   brow lift   CYSTOSCOPY WITH RETROGRADE PYELOGRAM, URETEROSCOPY AND STENT PLACEMENT Left 02/04/2021   Procedure: CYSTOSCOPY WITH RETROGRADE PYELOGRAM, URETEROSCOPY AND STENT PLACEMENT;  Surgeon: Sherlyn Ditto, MD;  Location: [facility];  Service: Urology;  Laterality: Left;  1 HR; nephrolithiasis   ECTOPIC PREGNANCY SURGERY  1980    EXTRACORPOREAL SHOCK WAVE LITHOTRIPSY Left 06/23/2018   Procedure: LEFT EXTRACORPOREAL SHOCK WAVE LITHOTRIPSY (ESWL);  Surgeon: Marco Severs, MD;  Location: [facility];  Service: Urology;  Laterality: Left;   GANGLION CYST EXCISION  1975   HOLMIUM LASER APPLICATION Left 02/04/2021   Procedure: HOLMIUM LASER APPLICATION;  Surgeon: Sherlyn Ditto, MD;  Location: [facility];  Service: Urology;  Laterality: Left;   PACEMAKER IMPLANT N/A 11/13/2020   Procedure: PACEMAKER IMPLANT;  Surgeon: Jolly Needle, MD;  Location: [facility];  Service: Cardiovascular;  Laterality: N/A;   TONSILLECTOMY  1957   adenoids removed   Social History   Social History Narrative   Not on file   Immunization History  Administered Date(s) Administered   Fluad Quad(high Dose 65+) 06/22/2019, 06/15/2022   Fluad Trivalent(High Dose 65+) 07/06/2023   Influenza Nasal 07/06/2023   Influenza Split 07/07/2011, 07/07/2012, 07/04/2013   Influenza, High Dose Seasonal PF 06/22/2019, 07/01/2020, 06/17/2021, 12/16/2021   Influenza,inj,Quad PF,6+ Mos 07/09/2018   Influenza-Unspecified 07/05/2014, 06/26/2015, 07/05/2016, 07/29/2017   Moderna Covid-19 Fall Seasonal Vaccine 27yrs & older 01/24/2024   Moderna Sars-Covid-2 Vaccination 11/07/2019, 12/07/2019, 07/29/2020, 07/06/2023   PFIZER Comirnaty(Gray Top)Covid-19 Tri-Sucrose Vaccine 01/11/2021   Pfizer Covid-19 Vaccine Bivalent Booster 64yrs & up 06/17/2021, 01/27/2022   Pneumococcal Conjugate-13 02/26/2015, 04/19/2017   Pneumococcal Polysaccharide-23 10/05/2009, 07/11/2018, 12/16/2021   Respiratory Syncytial Virus Vaccine,Recomb Aduvanted(Arexvy) 07/16/2022   Tdap 06/05/2009, 03/19/2020   Unspecified SARS-COV-2 Vaccination 06/23/2022   Zoster Recombinant(Shingrix) 07/20/2023   Zoster, Live 10/05/2009     Objective: Vital Signs: BP (!) 168/79 (BP Location: Left Arm, Patient Position: Sitting, Cuff Size: Normal)   Pulse 72   Resp 16   Ht 5'  4.5" (1.638 m)   Wt 172 lb 6.4 oz (78.2 kg)   BMI 29.14 kg/m    Physical Exam Vitals and nursing note reviewed.  Constitutional:      Appearance: She is well-developed.  HENT:     Head: Normocephalic and atraumatic.  Eyes:     Conjunctiva/sclera: Conjunctivae normal.  Cardiovascular:     Rate and Rhythm: Normal rate and regular rhythm.     Heart sounds: Normal heart sounds.  Pulmonary:     Effort: Pulmonary effort is normal.     Breath sounds: Normal breath sounds.  Abdominal:     General: Bowel sounds are normal.     Palpations: Abdomen is soft.  Musculoskeletal:     Cervical back: Normal range of motion.  Lymphadenopathy:     Cervical: No cervical adenopathy.  Skin:    General: Skin is warm and dry.     Capillary Refill: Capillary refill takes less than 2 seconds.  Neurological:     Mental Status: She is alert and oriented to person, place, and time.  Psychiatric:        Behavior: Behavior normal.      Musculoskeletal Exam: C-spine has good range of motion.  Discomfort and stiffness with range of motion of both shoulder joints.  Elbow joints and wrist joints have good range of motion.  PIP and DIP thickening.  Tenderness over MCP and PIP joints.  Hip joints have good range of motion with no groin pain.  Knee joints have good range of motion with no warmth or effusion.  Ankle joints have good range of motion with no tenderness or joint swelling.  CDAI Exam: CDAI Score: -- Patient Global: --; Provider Global: -- Swollen: --; Tender: -- Joint Exam 02/25/2024   No joint exam has been documented for this visit   There is currently no information documented on the homunculus. Go to the Rheumatology activity and complete the homunculus joint exam.  Investigation: No additional findings.  Imaging: US  LIMITED JOINT SPACE STRUCTURES UP BILAT Result Date: 02/24/2024 Ultrasound examination of bilateral hands was performed per EULAR recommendations. Using 15 MHz transducer,  grayscale and power Doppler bilateral first, second, and third MCP joints  both dorsal and volar aspects were evaluated to look for synovitis or tenosynovitis. The findings were there was  synovitis and tenosynovitis on ultrasound examination. Impression: Synovitis and tenosynovitis was noted on the limited ultrasound examination of the bilateral hands.  examination    MR LUMBAR SPINE WO CONTRAST Result Date: 02/22/2024  EXAM: MRI LUMBAR SPINE 02/22/2024 01:53:01 PM TECHNIQUE: Multiplanar multisequence MRI of the lumbar spine was performed without the administration of intravenous contrast. COMPARISON: None available. CLINICAL HISTORY: Lumbar radiculopathy. FINDINGS: BONES AND ALIGNMENT: Normal alignment. Normal vertebral body heights. Bone marrow signal is unremarkable. SPINAL CORD: The conus terminates normally. SOFT TISSUES: No paraspinal mass. L1-L2: Mild disc desiccation without significant protrusion or stenosis. L2-L3: Mild disc desiccation without significant protrusion or stenosis. L3-L4: Progressive broad-based disc protrusion and an annular tear at the left subarticular recess results in slight progression of mild subarticular and foraminal stenosis bilaterally. L4-L5: Left laminectomy is noted. Progressive broad-based disc protrusion is asymmetric to the left. Mild subarticular and moderate foraminal stenosis has progressed L5-S1: Rightward disc protrusion noted. Moderate right and mild left subarticular and foraminal stenosis is stable. IMPRESSION: 1. Progressive broad-based disc protrusion and annular tear at L3-L4 with slight progression of mild subarticular and foraminal stenosis bilaterally. 2. Progressive broad-based disc protrusion at L4-L5, asymmetric to the left, with mild subarticular and moderate foraminal stenosis bilaterally. 3. Rightward disc protrusion at L5-S1 with stable moderate right and mild left subarticular and foraminal stenosis. Electronically signed by: Audree Leas MD  02/22/2024 04:06 PM EDT RP Workstation: [id number]   CUP PACEART REMOTE DEVICE CHECK Result Date: 02/14/2024 PPM Scheduled remote reviewed. Normal device function.  Presenting rhythm:  AS/VS Next remote 91 days. LA, CVRS   Recent Labs: Lab Results  Component Value Date   WBC 6.1 01/20/2024   HGB 12.8 01/20/2024   PLT 180 01/20/2024   NA 139 01/20/2024   K 4.1 01/20/2024   CL 105 01/20/2024   CO2 29 01/20/2024   GLUCOSE 101 (H) 01/20/2024   BUN 16 01/20/2024   CREATININE 0.89 01/20/2024   BILITOT 0.4 01/20/2024   ALKPHOS 68 01/20/2024   AST 15 01/20/2024   ALT 9 01/20/2024   PROT 6.2 (L) 01/20/2024   ALBUMIN 3.7 01/20/2024   CALCIUM  9.3 01/20/2024   GFRAA 93 10/28/2020   QFTBGOLDPLUS NEGATIVE 07/06/2023    Speciality Comments: PLQ EYE EXAM 09/22/2023 normal [facility] f/u 12 months.  Procedures:  No procedures performed Allergies: Augmentin  [amoxicillin -pot clavulanate], Flomax [tamsulosin hcl], Latex, Tramadol , and Ciprofloxacin       Assessment / Plan:     Visit Diagnoses: Seronegative rheumatoid arthritis (HCC): Ultrasound performed yesterday on 02/24/2024--positive for synovitis and tenosynovitis involving both hands.  She has been taking Plaquenil  200 mg 1 tablet by mouth twice daily Monday through Friday and her symptoms have been inadequately controlled.  She has tried meloxicam  15 mg 1 tablet daily but is not experiencing any clinical benefit and plans to discontinue.  She presents today to discuss treatment options.  Reviewed indications, contraindications, potential side effects of methotrexate today in detail.  All questions were addressed and consent was obtained.  Plan to initiate methotrexate 6 tablets by mouth once weekly and if labs are stable in 2 weeks she will increase to 8 tablets once weekly.  She will also take folic acid 2 mg daily.  Prescriptions for both methotrexate and folic acid will be sent to the pharmacy pending lab results  today.  She will notify us  if she cannot tolerate taking methotrexate.  She will remain on Plaquenil  as combination therapy.  She will follow-up in the office in 8 weeks or sooner if needed.  Drug Counseling TB Gold: Negative 07/06/23  Hepatitis panel: B and C negative on 07/06/23   Chest-xray:  09/27/23  Contraception: postmenopausal   Alcohol use: discussed  importance of avoiding alcohol use  Patient was counseled on the purpose, proper use, and adverse effects of methotrexate including nausea, infection, and signs and symptoms of pneumonitis.  Reviewed instructions with patient to take methotrexate weekly along with folic acid daily.  Discussed the importance of frequent monitoring of kidney and liver function and blood counts, and provided patient with standing lab instructions.  Counseled patient to avoid NSAIDs and alcohol while on methotrexate.  Provided patient with educational materials on methotrexate and answered all questions.  Advised patient to get annual influenza vaccine and to get a pneumococcal vaccine if patient has not already had one.  Patient voiced understanding.  Patient consented to methotrexate use.  Will upload into chart.    High risk medication use - Plaquenil  200 mg 1 tablet by mouth twice daily Monday to Friday. Plan to initiate methotrexate 6 tablets by mouth once weekly x 2 weeks and if labs are stable she will increase to 8 tablets once weekly.  She will take folic acid 2 mg daily. CBC and CMP updated on 01/20/24.  Orders for CBC and CMP released today.  Her next lab work will be due in 2 weeks x 2, 2 months, then every 3 months.  Standing orders for CBC and CMP remain in place. No recurrent infections.  Discussed the importance of holding methotrexate if she develops signs or symptoms of an infection and to resume once the infection has completely cleared. PLQ EYE EXAM 09/22/2023 normal [facility] f/u 12 months.  - Plan: CBC with  Differential/Platelet, Comprehensive metabolic panel with GFR  Positive ANA (antinuclear antibody) - low titer positive ANA, ENA panel and complements are normal.  No clinical features of systemic lupus.  Current chronic use of systemic steroids - Recurrent use of prednisone  since February 2024.  She has been off and on prednisone --most recent taper was discontinued on 01/13/2024.  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  She remains under the care of pulmonology yearly.   Chest x-ray from 10/05/2023 was reviewed today in the office: No evidence of acute cardiac or pulmonary abnormality.  Similar bronchiectasis and upper lobe predominant reticular opacities and/or scarring favoring chronic changes of sarcoidosis were noted. Currently asymptomatic.  Recommended continuing to follow-up with pulmonology on a yearly basis.   Dry eye syndrome of both eyes: She is using Systane eyedrops twice daily.  Age-related osteoporosis without current pathological fracture: Previous DEXA 11/05/21: T-score -2.6, BMD 0.756 AP spine.  DEXA updated on 01/08/2024:The BMD measured at AP Spine L1-L4 is 0.834 g/cm2 with a T-score of -2.9.  Patient was previously evaluated by endocrinology who recommended initiating Fosamax 70 mg 1 tablet by mouth once weekly.  The use of Fosamax was discussed in detail at her last office visit at which time she plan to initiate therapy.  She has noticed some increased gas since initiating Fosamax but has not noticed any reflux.  Overall Fosamax has been tolerable and she does not want to make any medication changes at this time.  IV Reclast could be an option in the future if she has ongoing GI SE. She is apprehensive to initiate Prolia, Evenity, or a daily antibiotic agent.      Other medical conditions are listed as follows:  MGUS (monoclonal gammopathy of unknown significance): Under the care of hematology.  Other eczema  Other fatigue  Malignant  neoplasm of ovary, unspecified laterality (HCC)  Pacemaker  Symptomatic sinus bradycardia  Pulmonary hypertension (HCC)  Obstructive sleep apnea  Salzmann's nodular dystrophy of both eyes  Abnormal SPEP  Other insomnia  Orders: Orders Placed This Encounter  Procedures   CBC with Differential/Platelet   Comprehensive metabolic panel with GFR   No orders of the defined types were placed in this encounter.   Follow-Up Instructions: Return in about 8 weeks (around 04/21/2024) for Rheumatoid arthritis.   Romayne Clubs, PA-C  Note - This record has been created using Dragon software.  Chart creation errors have been sought, but may not always  have been located. Such creation errors do not reflect on  the standard of medical care.

## 2024-02-24 NOTE — Progress Notes (Signed)
 Visit diagnosis: pain in both hands and rheumatoid arthritis.  Patient has been experiencing pain and discomfort in the bilateral hands.  She was here to get ultrasound examination of bilateral hands to look for synovitis and tenosynovitis.  An ultrasound examination was performed which is described below.  Ultrasound examination of bilateral hands was performed per EULAR recommendations. Using 15 MHz transducer, grayscale and power Doppler bilateral first, second, and third MCP joints  both dorsal and volar aspects were evaluated to look for synovitis or tenosynovitis. The findings were there was  synovitis and tenosynovitis on ultrasound examination.   Impression: Synovitis and tenosynovitis was noted on the limited ultrasound examination of the bilateral hands.   Ultrasound findings were discussed with the patient.  Patient will schedule an appointment to discuss treatment options.  Nicholas Bari, MD

## 2024-02-25 ENCOUNTER — Telehealth: Payer: Self-pay

## 2024-02-25 ENCOUNTER — Other Ambulatory Visit: Payer: Self-pay

## 2024-02-25 ENCOUNTER — Encounter: Payer: Self-pay | Admitting: Physician Assistant

## 2024-02-25 ENCOUNTER — Ambulatory Visit: Attending: Physician Assistant | Admitting: Physician Assistant

## 2024-02-25 VITALS — BP 168/79 | HR 72 | Resp 16 | Ht 64.5 in | Wt 172.4 lb

## 2024-02-25 DIAGNOSIS — I272 Pulmonary hypertension, unspecified: Secondary | ICD-10-CM

## 2024-02-25 DIAGNOSIS — R5383 Other fatigue: Secondary | ICD-10-CM | POA: Diagnosis not present

## 2024-02-25 DIAGNOSIS — D869 Sarcoidosis, unspecified: Secondary | ICD-10-CM

## 2024-02-25 DIAGNOSIS — H04123 Dry eye syndrome of bilateral lacrimal glands: Secondary | ICD-10-CM

## 2024-02-25 DIAGNOSIS — C569 Malignant neoplasm of unspecified ovary: Secondary | ICD-10-CM

## 2024-02-25 DIAGNOSIS — H18453 Nodular corneal degeneration, bilateral: Secondary | ICD-10-CM

## 2024-02-25 DIAGNOSIS — Z7952 Long term (current) use of systemic steroids: Secondary | ICD-10-CM | POA: Diagnosis not present

## 2024-02-25 DIAGNOSIS — R778 Other specified abnormalities of plasma proteins: Secondary | ICD-10-CM

## 2024-02-25 DIAGNOSIS — R768 Other specified abnormal immunological findings in serum: Secondary | ICD-10-CM

## 2024-02-25 DIAGNOSIS — M810 Age-related osteoporosis without current pathological fracture: Secondary | ICD-10-CM | POA: Diagnosis not present

## 2024-02-25 DIAGNOSIS — Z79899 Other long term (current) drug therapy: Secondary | ICD-10-CM | POA: Diagnosis not present

## 2024-02-25 DIAGNOSIS — M0600 Rheumatoid arthritis without rheumatoid factor, unspecified site: Secondary | ICD-10-CM | POA: Diagnosis not present

## 2024-02-25 DIAGNOSIS — D472 Monoclonal gammopathy: Secondary | ICD-10-CM | POA: Diagnosis not present

## 2024-02-25 DIAGNOSIS — L308 Other specified dermatitis: Secondary | ICD-10-CM | POA: Diagnosis not present

## 2024-02-25 DIAGNOSIS — R001 Bradycardia, unspecified: Secondary | ICD-10-CM

## 2024-02-25 DIAGNOSIS — Z950 Presence of cardiac pacemaker: Secondary | ICD-10-CM

## 2024-02-25 DIAGNOSIS — G4733 Obstructive sleep apnea (adult) (pediatric): Secondary | ICD-10-CM

## 2024-02-25 DIAGNOSIS — G4709 Other insomnia: Secondary | ICD-10-CM

## 2024-02-25 NOTE — Patient Instructions (Signed)
 Standing Labs We placed an order today for your standing lab work.   Please have your standing labs drawn in 2 weeks x2, 2 months, then every 3 months   Please have your labs drawn 2 weeks prior to your appointment so that the provider can discuss your lab results at your appointment, if possible.  Please note that you may see your imaging and lab results in MyChart before we have reviewed them. We will contact you once all results are reviewed. Please allow our office up to 72 hours to thoroughly review all of the results before contacting the office for clarification of your results.  WALK-IN LAB HOURS  Monday through Thursday from 8:00 am -12:30 pm and 1:00 pm-4:00 pm and Friday from 8:00 am-12:00 pm.  Patients with office visits requiring labs will be seen before walk-in labs.  You may encounter longer than normal wait times. Please allow additional time. Wait times may be shorter on  Monday and Thursday afternoons.  We do not book appointments for walk-in labs. We appreciate your patience and understanding with our staff.   Labs are drawn by [facility]. Please bring your co-pay at the time of your lab draw.  You may receive a bill from [facility] for your lab work.  Please note if you are on Hydroxychloroquine  and and an order has been placed for a Hydroxychloroquine  level,  you will need to have it drawn 4 hours or more after your last dose.  If you wish to have your labs drawn at another location, please call the office 24 hours in advance so we can fax the orders.  The office is located at [address], [address], [place], [place] [address]   If you have any questions regarding directions or hours of operation,  please call [phone].   As a reminder, please drink plenty of water prior to coming for your lab work. Thanks!   Methotrexate Tablets What is this medication? METHOTREXATE (METH oh TREX ate) treats autoimmune conditions, such as arthritis and psoriasis. It works by  decreasing inflammation, which can reduce pain and prevent long-term injury to the joints and skin. It may also be used to treat some types of cancer. It works by slowing down the growth of cancer cells. This medicine may be used for other purposes; ask your health care provider or pharmacist if you have questions. COMMON BRAND NAME(S): Rheumatrex, Trexall What should I tell my care team before I take this medication? They need to know if you have any of these conditions: Dehydration Diabetes Fluid in the stomach area or lungs Frequently drink alcohol Having surgery, including dental surgery High cholesterol Immune system problems Inflammatory bowel disease, such as ulcerative colitis Kidney disease Liver disease Low blood cell levels (white cells, red cells, and platelets) Lung disease Recent or ongoing radiation Recent or upcoming vaccine Stomach ulcers, other stomach or intestine problems An unusual or allergic reaction to methotrexate, other medications, foods, dyes, or preservatives Pregnant or trying to get pregnant Breastfeeding How should I use this medication? Take this medication by mouth with water. Take it as directed on the prescription label. Do not take extra. Keep taking this medication until your care team tells you to stop. Know why you are taking this medication and how you should take it. To treat conditions such as arthritis and psoriasis, this medication is taken ONCE A WEEK as a single dose or divided into 3 smaller doses taken 12 hours apart (do not take more than 3  doses 12 hours apart each week). This medication is NEVER taken daily to treat conditions other than cancer. Taking this medication more often than directed can cause serious side effects, even death. Talk to your care team about why you are taking this medication, how often you will take it, and what your dose is. Ask your care team to put the reason you take this medication on the prescription. If you  take this medication ONCE A WEEK, choose a day of the week before you start. Ask your pharmacist to include the day of the week on the label. Avoid "Monday", which could be misread as "Morning". Handling this medication may be harmful. Talk to your care team about how to handle this medication. Special instructions may apply. Talk to your care team about the use of this medication in children. While it may be prescribed for selected conditions, precautions do apply. Overdosage: If you think you have taken too much of this medicine contact a poison control center or emergency room at once. NOTE: This medicine is only for you. Do not share this medicine with others. What if I miss a dose? If you miss a dose, talk with your care team. Do not take double or extra doses. What may interact with this medication? Do not take this medication with any of the following: Acitretin Live virus vaccines Probenecid This medication may also interact with the following: Alcohol Aspirin and aspirin-like medications Certain antibiotics, such as penicillin, neomycin, sulfamethoxazole ; trimethoprim  Certain medications for stomach problems, such as lansoprazole, omeprazole, pantoprazole Clozapine Cyclosporine Dapsone Folic acid Foscarnet NSAIDs, medications for pain and inflammation, such as ibuprofen or naproxen Phenytoin Pyrimethamine Steroid medications, such as prednisone  or cortisone Tacrolimus Theophylline This list may not describe all possible interactions. Give your health care provider a list of all the medicines, herbs, non-prescription drugs, or dietary supplements you use. Also tell them if you smoke, drink alcohol, or use illegal drugs. Some items may interact with your medicine. What should I watch for while using this medication? Visit your care team for regular checks on your progress. It may be some time before you see the benefit from this medication. You may need blood work done while you  are taking this medication. If your care team has also prescribed folic acid, they may instruct you to skip your folic acid dose on the day you take methotrexate. This medication can make you more sensitive to the sun. Keep out of the sun. If you cannot avoid being in the sun, wear protective clothing and sunscreen. Do not use sun lamps, tanning beds, or tanning booths. Check with your care team if you have severe diarrhea, nausea, and vomiting, or if you sweat a lot. The loss of too much body fluid may make it dangerous for you to take this medication. This medication may increase your risk of getting an infection. Call your care team for advice if you get a fever, chills, sore throat, or other symptoms of a cold or flu. Do not treat yourself. Try to avoid being around people who are sick. Talk to your care team about your risk of cancer. You may be more at risk for certain types of cancers if you take this medication. Talk to your care team if you or your partner may be pregnant. Serious birth defects can occur if you take this medication during pregnancy and for 6 months after the last dose. You will need a negative pregnancy test before starting this medication. Contraception is  recommended while taking this medication and for 6 months after the last dose. Your care team can help you find the option that works for you. If your partner can get pregnant, use a condom during sex while taking this medication and for 3 months after the last dose. Do not breastfeed while taking this medication and for 1 week after the last dose. This medication may cause infertility. Talk to your care team if you are concerned about your fertility. What side effects may I notice from receiving this medication? Side effects that you should report to your care team as soon as possible: Allergic reactions--skin rash, itching, hives, swelling of the face, lips, tongue, or throat Dry cough, shortness of breath or trouble  breathing Infection--fever, chills, cough, sore throat, wounds that don't heal, pain or trouble when passing urine, general feeling of discomfort or being unwell Kidney injury--decrease in the amount of urine, swelling of the ankles, hands, or feet Liver injury--right upper belly pain, loss of appetite, nausea, light-colored stool, dark yellow or brown urine, yellowing skin or eyes, unusual weakness or fatigue Low red blood cell level--unusual weakness or fatigue, dizziness, headache, trouble breathing Pain, tingling, or numbness in the hands or feet, muscle weakness, change in vision, confusion or trouble speaking, loss of balance or coordination, trouble walking, seizures Redness, blistering, peeling, or loosening of the skin, including inside the mouth Stomach bleeding--bloody or black, tar-like stools, vomiting blood or brown material that looks like coffee grounds Stomach pain that is severe, does not go away, or gets worse Unusual bruising or bleeding Side effects that usually do not require medical attention (report these to your care team if they continue or are bothersome): Diarrhea Dizziness Hair loss Nausea Pain, redness, or swelling with sores inside the mouth or throat Skin reactions on sun-exposed areas Vomiting This list may not describe all possible side effects. Call your doctor for medical advice about side effects. You may report side effects to FDA at 1-800-FDA-1088. Where should I keep my medication? Keep out of the reach of children and pets. Store at room temperature between 20 and 25 degrees C (68 and 77 degrees F). Protect from light. Keep the container tightly closed. Get rid of any unused medication after the expiration date. To get rid of medications that are no longer needed or have expired: Take the medication to a medication take-back program. Check with your pharmacy or law enforcement to find a location. If you cannot return the medication, ask your pharmacist  or care team how to get rid of this medication safely. NOTE: This sheet is a summary. It may not cover all possible information. If you have questions about this medicine, talk to your doctor, pharmacist, or health care provider.  2024 Elsevier/Gold Standard (2023-09-03 00:00:00)

## 2024-02-25 NOTE — Telephone Encounter (Signed)
 Per Jacinta Martinis PA-C , pending lab results, start patient on Methotrexate.

## 2024-02-25 NOTE — Telephone Encounter (Signed)
 Opened in error

## 2024-02-26 LAB — COMPREHENSIVE METABOLIC PANEL WITH GFR
AG Ratio: 1.7 (calc) (ref 1.0–2.5)
ALT: 7 U/L (ref 6–29)
AST: 14 U/L (ref 10–35)
Albumin: 3.6 g/dL (ref 3.6–5.1)
Alkaline phosphatase (APISO): 60 U/L (ref 37–153)
BUN: 18 mg/dL (ref 7–25)
CO2: 27 mmol/L (ref 20–32)
Calcium: 9 mg/dL (ref 8.6–10.4)
Chloride: 107 mmol/L (ref 98–110)
Creat: 0.75 mg/dL (ref 0.60–1.00)
Globulin: 2.1 g/dL (ref 1.9–3.7)
Glucose, Bld: 82 mg/dL (ref 65–99)
Potassium: 4.3 mmol/L (ref 3.5–5.3)
Sodium: 141 mmol/L (ref 135–146)
Total Bilirubin: 0.4 mg/dL (ref 0.2–1.2)
Total Protein: 5.7 g/dL — ABNORMAL LOW (ref 6.1–8.1)
eGFR: 85 mL/min/{1.73_m2} (ref >=60)

## 2024-02-26 LAB — CBC WITH DIFFERENTIAL/PLATELET
Absolute Lymphocytes: 1709 {cells}/uL (ref 850–3900)
Absolute Monocytes: 915 {cells}/uL (ref 200–950)
Basophils Absolute: 32 {cells}/uL (ref 0–200)
Basophils Relative: 0.4 %
Eosinophils Absolute: 300 {cells}/uL (ref 15–500)
Eosinophils Relative: 3.7 %
HCT: 35.1 % (ref 35.0–45.0)
Hemoglobin: 11.5 g/dL — ABNORMAL LOW (ref 11.7–15.5)
MCH: 28.7 pg (ref 27.0–33.0)
MCHC: 32.8 g/dL (ref 32.0–36.0)
MCV: 87.5 fL (ref 80.0–100.0)
MPV: 10.5 fL (ref 7.5–12.5)
Monocytes Relative: 11.3 %
Neutro Abs: 5144 {cells}/uL (ref 1500–7800)
Neutrophils Relative %: 63.5 %
Platelets: 217 10*3/uL (ref 140–400)
RBC: 4.01 10*6/uL (ref 3.80–5.10)
RDW: 13.8 % (ref 11.0–15.0)
Total Lymphocyte: 21.1 %
WBC: 8.1 10*3/uL (ref 3.8–10.8)

## 2024-02-29 ENCOUNTER — Ambulatory Visit: Payer: Self-pay | Admitting: Physician Assistant

## 2024-02-29 ENCOUNTER — Other Ambulatory Visit: Payer: Self-pay | Admitting: Physician Assistant

## 2024-02-29 MED ORDER — METHOTREXATE SODIUM 2.5 MG PO TABS: ORAL_TABLET | ORAL | 0 refills | Status: DC

## 2024-02-29 MED ORDER — FOLIC ACID 1 MG PO TABS: 2.0000 mg | ORAL_TABLET | Freq: Every day | ORAL | 3 refills | Status: DC

## 2024-02-29 NOTE — Progress Notes (Signed)
 Total protein is low. Rest of CMP WNL.   Hemoglobin is borderline low-11.5.   Ok to send in methotrexate and folic acid as discussed at office visit from Friday.

## 2024-02-29 NOTE — Telephone Encounter (Signed)
 Total protein is low. Rest of CMP WNL.   Hemoglobin is borderline low-11.5.   Ok to send in methotrexate and folic acid as discussed at office visit from Friday.  Plan to initiate methotrexate 6 tablets by mouth once weekly x 2 weeks and if labs are stable she will increase to 8 tablets once weekly. She will take folic acid 2 mg daily.

## 2024-02-29 NOTE — Addendum Note (Signed)
 Addended by: Adrianne Horn on: 02/29/2024 09:54 AM   Modules accepted: Orders

## 2024-03-01 ENCOUNTER — Encounter: Payer: Self-pay | Admitting: Internal Medicine

## 2024-03-01 NOTE — Patient Instructions (Signed)
 Increase Losartan  to 100 mg daily and send us  some BP readings through My Chart within the next 10 days or so.

## 2024-03-02 ENCOUNTER — Telehealth: Payer: Self-pay

## 2024-03-02 NOTE — Telephone Encounter (Signed)
 Patient called because she had to pay for folic acid out of pocket due to the quantity she is being told to take, patient not sure why she is having to pay for the folic acid, states pharmacy told her that she would need to have a reason why she is being prescribed 2mg  tablets instead of 1 and if we can get the pharmacy something stating why she is on that dose.   Patient asked if she would be okay to take the methotrexate on the same day that she takes her Fosamax. She takes the Fosamax on Sunday mornings and wanting to take the methotrexate at night? Patient also wants to know if it is okay to take oxycodone  hcl in the case she is experiencing any pain and can't take NSAIDs nor Tylenol .   Please advise.

## 2024-03-03 DIAGNOSIS — M545 Low back pain, unspecified: Secondary | ICD-10-CM | POA: Diagnosis not present

## 2024-03-03 NOTE — Telephone Encounter (Signed)
 Recommend 2 mg of folic acid since she will be increasing the dose of methotrexate to 8 tablets weekly.    Ok to take methotrexate the same day as fosamax as long as she spaces the dosing.    Ok to take oxycodone  PRN.

## 2024-03-03 NOTE — Telephone Encounter (Signed)
 Contacted patient recommend 2 mg of folic acid since she will be increasing the dose of methotrexate to 8 tablets weekly.     Ok to take methotrexate the same day as fosamax as long as she spaces the dosing.     Ok to take oxycodone  PRN.  Patient expressed understanding

## 2024-03-06 ENCOUNTER — Telehealth: Payer: Self-pay

## 2024-03-06 ENCOUNTER — Encounter: Payer: Self-pay | Admitting: Internal Medicine

## 2024-03-06 NOTE — Telephone Encounter (Signed)
 Patient contacted the office stating she is having total numbness in her left hand and some in the right hand, thought it may have been because she was sleeping on it wrong but it has been numb all day. She stated this started Friday and has continued with no ease. She has not started the Methotrexate  as she has planned to start it tonight 03/06/2024. Patient would like to know if this is something the Methotrexate  would help with? Please advise

## 2024-03-06 NOTE — Telephone Encounter (Signed)
 Please clarify if she is having weakness in her upper extremities--if so she should seek further evaluation.   She may be experiencing numbness due to the amount of inflammation she has in her hands and wrist joints currently.  If the numbness is related to inflammation methotrexate  should help to alleviate the symptoms.

## 2024-03-07 NOTE — Telephone Encounter (Signed)
 Contacted patient clarify if she is having weakness in her upper extremities, patient stated she is not having any weakness.    Explained that she She may be experiencing numbness due to the amount of inflammation she has in her hands and wrist joints currently.  If the numbness is related to inflammation methotrexate  should help to alleviate the symptoms.  Patient expressed verbal understanding.

## 2024-03-08 DIAGNOSIS — G4733 Obstructive sleep apnea (adult) (pediatric): Secondary | ICD-10-CM | POA: Diagnosis not present

## 2024-03-08 DIAGNOSIS — M069 Rheumatoid arthritis, unspecified: Secondary | ICD-10-CM | POA: Diagnosis not present

## 2024-03-20 ENCOUNTER — Other Ambulatory Visit: Payer: Self-pay | Admitting: *Deleted

## 2024-03-20 ENCOUNTER — Other Ambulatory Visit: Payer: Self-pay | Admitting: Rheumatology

## 2024-03-20 DIAGNOSIS — Z79899 Other long term (current) drug therapy: Secondary | ICD-10-CM

## 2024-03-20 MED ORDER — PREDNISONE 5 MG PO TABS: ORAL_TABLET | ORAL | 0 refills | Status: DC

## 2024-03-20 NOTE — Telephone Encounter (Signed)
Ok to send in prednisone starting at 20 mg tapering by 5 mg every 4 days.  Take prednisone in the morning with food and avoid the use of NSAIDs.

## 2024-03-20 NOTE — Addendum Note (Signed)
 Addended by: Lott Rouleau A on: 03/20/2024 09:05 AM   Modules accepted: Orders

## 2024-03-20 NOTE — Telephone Encounter (Signed)
 Patient states her hands are the worst. Patient she is also having pain in her shoulders and knees. Patient would like to know if she can have a prednisone  taper while waiting on the MTX to start working. Patient has only had her first 2 doses on MTX. Please advise.

## 2024-03-20 NOTE — Telephone Encounter (Signed)
 Patient advised we are sending in prednisone  starting at 20 mg tapering by 5 mg every 4 days. Patient advised to take prednisone  in the morning with food and avoid the use of NSAIDs.

## 2024-03-20 NOTE — Progress Notes (Signed)
 Remote pacemaker transmission.

## 2024-03-20 NOTE — Telephone Encounter (Signed)
 Patient requested a return call to let her know if there is anything she can take for pain while she waits for the Methotrexate  medication to start working.

## 2024-03-21 ENCOUNTER — Ambulatory Visit: Payer: Self-pay | Admitting: Physician Assistant

## 2024-03-21 LAB — COMPREHENSIVE METABOLIC PANEL WITH GFR
AG Ratio: 1.6 (calc) (ref 1.0–2.5)
ALT: 10 U/L (ref 6–29)
AST: 14 U/L (ref 10–35)
Albumin: 3.6 g/dL (ref 3.6–5.1)
Alkaline phosphatase (APISO): 65 U/L (ref 37–153)
BUN: 20 mg/dL (ref 7–25)
CO2: 26 mmol/L (ref 20–32)
Calcium: 8.8 mg/dL (ref 8.6–10.4)
Chloride: 106 mmol/L (ref 98–110)
Creat: 0.71 mg/dL (ref 0.60–1.00)
Globulin: 2.2 g/dL (ref 1.9–3.7)
Glucose, Bld: 97 mg/dL (ref 65–99)
Potassium: 4.3 mmol/L (ref 3.5–5.3)
Sodium: 139 mmol/L (ref 135–146)
Total Bilirubin: 0.5 mg/dL (ref 0.2–1.2)
Total Protein: 5.8 g/dL — ABNORMAL LOW (ref 6.1–8.1)
eGFR: 90 mL/min/{1.73_m2} (ref >=60)

## 2024-03-21 LAB — CBC WITH DIFFERENTIAL/PLATELET
Absolute Lymphocytes: 1661 {cells}/uL (ref 850–3900)
Absolute Monocytes: 826 {cells}/uL (ref 200–950)
Basophils Absolute: 16 {cells}/uL (ref 0–200)
Basophils Relative: 0.2 %
Eosinophils Absolute: 251 {cells}/uL (ref 15–500)
Eosinophils Relative: 3.1 %
HCT: 33.5 % — ABNORMAL LOW (ref 35.0–45.0)
Hemoglobin: 10.8 g/dL — ABNORMAL LOW (ref 11.7–15.5)
MCH: 28.3 pg (ref 27.0–33.0)
MCHC: 32.2 g/dL (ref 32.0–36.0)
MCV: 87.9 fL (ref 80.0–100.0)
MPV: 10.4 fL (ref 7.5–12.5)
Monocytes Relative: 10.2 %
Neutro Abs: 5346 {cells}/uL (ref 1500–7800)
Neutrophils Relative %: 66 %
Platelets: 240 10*3/uL (ref 140–400)
RBC: 3.81 10*6/uL (ref 3.80–5.10)
RDW: 13.5 % (ref 11.0–15.0)
Total Lymphocyte: 20.5 %
WBC: 8.1 10*3/uL (ref 3.8–10.8)

## 2024-03-21 MED ORDER — FOLIC ACID 1 MG PO TABS: 2.0000 mg | ORAL_TABLET | Freq: Every day | ORAL | 3 refills | Status: AC

## 2024-03-21 NOTE — Progress Notes (Signed)
 Reviewed with Dr. Rudi Corwin is in agreement that the hemoglobin and hematocrit may be related to underlying RA.  Recommend increasing methotrexate  to 8 tablets weekly--recheck CBC  and CMP in 2 weeks.

## 2024-03-21 NOTE — Progress Notes (Signed)
 Total protein remains low and continues to trend down.  Rest of CMP WNL Hemoglobin and hematocrit are low and continue to trend down.  May be related to chronic disease-RA or due to methotrexate  use.  We will continue to monitor closely.  Continue to take folic acid  daily as prescribed.   Recheck lab work in 1 month.

## 2024-03-23 ENCOUNTER — Ambulatory Visit (INDEPENDENT_AMBULATORY_CARE_PROVIDER_SITE_OTHER): Admitting: Internal Medicine

## 2024-03-23 VITALS — BP 130/78 | HR 74

## 2024-03-23 DIAGNOSIS — D869 Sarcoidosis, unspecified: Secondary | ICD-10-CM

## 2024-03-23 DIAGNOSIS — M0600 Rheumatoid arthritis without rheumatoid factor, unspecified site: Secondary | ICD-10-CM | POA: Diagnosis not present

## 2024-03-23 DIAGNOSIS — M810 Age-related osteoporosis without current pathological fracture: Secondary | ICD-10-CM | POA: Diagnosis not present

## 2024-03-23 DIAGNOSIS — Z950 Presence of cardiac pacemaker: Secondary | ICD-10-CM

## 2024-03-23 DIAGNOSIS — H905 Unspecified sensorineural hearing loss: Secondary | ICD-10-CM

## 2024-03-23 DIAGNOSIS — I10 Essential (primary) hypertension: Secondary | ICD-10-CM

## 2024-03-23 DIAGNOSIS — E7800 Pure hypercholesterolemia, unspecified: Secondary | ICD-10-CM | POA: Diagnosis not present

## 2024-03-23 DIAGNOSIS — Z8543 Personal history of malignant neoplasm of ovary: Secondary | ICD-10-CM | POA: Diagnosis not present

## 2024-03-23 DIAGNOSIS — Z8679 Personal history of other diseases of the circulatory system: Secondary | ICD-10-CM

## 2024-03-23 MED ORDER — METHOTREXATE SODIUM 2.5 MG PO TABS: 20.0000 mg | ORAL_TABLET | ORAL | 0 refills | Status: DC

## 2024-03-23 MED ORDER — LOSARTAN POTASSIUM 100 MG PO TABS
100.0000 mg | ORAL_TABLET | Freq: Every day | ORAL | 1 refills | Status: AC
Start: 2024-03-23 — End: ?

## 2024-03-23 NOTE — Progress Notes (Addendum)
 Patient Care Team: Perri Ronal PARAS, MD as PCP - General (Internal Medicine) Mealor, Eulas BRAVO, MD as Consulting Physician (Cardiology) Dolphus Reiter, MD as Consulting Physician (Rheumatology)  Visit Date: 03/23/24  Subjective:   Chief Complaint  Patient presents with   Hypertension    Patient states no side effects form new increased medication.    Vitals:   03/23/24 1241 03/23/24 1301  BP: (!) 140/82 130/78   Patient PI:Julia Solis,Female DOB:[birth year],73 y.o. FMW:[id number]   73 y.o.Female presents today for 1 month follow-up for Elevated Blood Pressure. Patient has a past medical history of Rheumatoid Arthritis managed with Plaquenil  200 mg twice daily except on Saturday & Sunday, on 5/27 started Methotrexate  20 mg weekly, and on 6/17 started a Prednisone  tapering course x 16 days - she says today that her pain has been more well managed since starting the Methotrexate  and while taking the Prednisone , and she follows up with Rheumatology in July. Initially seen for BP Elevation on 5/06, subsequently on 5/20, her Losartan  has been increased from 25 mg to 100 mg daily since and she denies any side effects from increased dosage. Blood Pressure: elevated initially today at 140/82, 20 minutes later normalized to 130/78.   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   Complication of anesthesia    GERD (gastroesophageal reflux disease)    Hearing aid worn    both ears   History of chemotherapy 2011   History of hiatal hernia    History of kidney stones    Hypertension    MGUS (monoclonal gammopathy of unknown significance)    dx by hematology per patient   Neuromuscular disorder (HCC)    left foot numbness since  eback surgery   Osteoporosis    PONV (postoperative nausea and vomiting)    Presence of permanent cardiac pacemaker    Sarcoidosis    in remission sees pumonary  once a year dr bellinger lov 08-21-2020 [facility]   Sick sinus  syndrome (HCC)    Wears glasses     Allergies  Allergen Reactions   Augmentin  [Amoxicillin -Pot Clavulanate]     Diarrhea    Flomax [Tamsulosin Hcl] Other (See Comments)    Dizziness   Latex Other (See Comments)    Skin irritation   Tramadol  Other (See Comments)    jittery, insomnia   Ciprofloxacin Rash    Family History  Problem Relation Age of Onset   Hypertension Mother    Prostate cancer Father 80   Cancer Father    Hypertension Father    Stroke Father    Colon polyps Sister    Colon cancer Maternal Grandmother        dx in her 69s   Heart disease Maternal Grandfather    Colon cancer Paternal Grandmother    Heart disease Paternal Grandfather    Breast cancer Paternal Aunt 65   Colon cancer Paternal Aunt        dx in her 62s   Melanoma Cousin 58   Breast cancer Cousin 27       multiple cousins   Colon polyps Cousin    Breast cancer Cousin 91   Breast cancer Cousin        father's maternal 1st cousin   Breast cancer Cousin        Father's first cousin's daughter   Cancer Other        maternal great uncle with Cancer - NOS  Cancer Other        maternal great grandmother with either colon or stomach cancer   Breast cancer Other        fathers maternal aunt   Social Hx: resides alone. Divorced. @ adult daughters. Currently is a nonsmoker. Social alcohol consumption. Did smoke from the ages of [age]. 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  Review of Systems  Cardiovascular: Negative.   Musculoskeletal:        (+) Arthralgias due to RA  All other systems reviewed and are negative.    Objective:  Vitals: BP 130/78 (BP Location: Left Arm, Patient Position: Sitting, Cuff Size: Normal)   Pulse 74   SpO2 98%   Physical Exam Vitals and nursing note reviewed.  Constitutional:      General: She is not in acute distress.    Appearance: Normal appearance. She is not toxic-appearing.  HENT:     Head: Normocephalic and atraumatic.    Cardiovascular:     Rate and Rhythm: Normal rate and regular rhythm. No extrasystoles are present.    Pulses: Normal pulses.     Heart sounds: Normal heart sounds. No murmur heard.    No friction rub. No gallop.  Pulmonary:     Effort: Pulmonary effort is normal. No respiratory distress.     Breath sounds: Normal breath sounds. No wheezing or rales.   Skin:    General: Skin is warm and dry.   Neurological:     Mental Status: She is alert and oriented to person, place, and time. Mental status is at baseline.   Psychiatric:        Mood and Affect: Mood normal.        Behavior: Behavior normal.        Thought Content: Thought content normal.        Judgment: Judgment normal.     Results:  Studies Obtained And Personally Reviewed By Me:  Blood Pressure Logs, copied from 5/06 & 5/20: Date Blood Pressure Heart Rate  4/22 131/92 75  4/23 129/88 82  4/24 130/82 100  4/25 144/84 77  4/26 114/84 81  4/28 137/89 74  4/29 149/84 73  4/30 146/87 77  5/01 148/94 72  5/02 149/86 72  5/04 156/103 72  5/05 158/82 68  5/06 166/92 72   Date Blood Pressure Heart Rate  5/07 143/89 67  5/08 156/84 81  5/09 143/89 76  5/10 138/80 75  5/11 155/87 79  5/13 153/91 85  5/14 139/91 84  5/17 127/86 85  5/19 148/95 101  5/20 160/92 146/87 ten minutes later 79 73 ten minutes later   Updated Blood Pressures provided in-office from 5/21 - 6/18:  Date Blood Pressure Heart Rate  5/21 130/88 90  5/22 141/83 78  5/23  174/87 63  5/24   152/87 72  5/25 134/79 75  5/26  139/83 65  5/27 168/91  *after big meal  *Started Methotrexate  20 mg weekly 85  5/28 149/81  *lots of pain + headache 68  5/29   137/80 77  5/30  127/78 73  5/31  137/87 64  6/01  158/84  69  6/03  138/79  68  6/04 129/67 60  6/06 133/79 60  6/07  145/78 68  6/10 157/82  94  6/12 121/75   76  6/14 137/81  68  6/17 137/81   *started Prednisone  tapering x 16 days 72  6/18  140/86 76  6/19  140/82 130/78 20 minutes later  74 Pulse not repeated 20 minutes later   Labs:     Component Value Date/Time   NA 139 03/20/2024 1309   NA 144 10/28/2020 1636   K 4.3 03/20/2024 1309   CL 106 03/20/2024 1309   CO2 26 03/20/2024 1309   GLUCOSE 97 03/20/2024 1309   BUN 20 03/20/2024 1309   BUN 16 10/28/2020 1636   CREATININE 0.71 03/20/2024 1309   CALCIUM  8.8 03/20/2024 1309   PROT 5.8 (L) 03/20/2024 1309   ALBUMIN 3.7 01/20/2024 1310   AST 14 03/20/2024 1309   ALT 10 03/20/2024 1309   ALKPHOS 68 01/20/2024 1310   BILITOT 0.5 03/20/2024 1309   GFRNONAA >60 01/20/2024 1310   GFRNONAA 82 07/11/2020 0949   GFRAA 93 10/28/2020 1636   GFRAA 95 07/11/2020 0949    Lab Results  Component Value Date   WBC 8.1 03/20/2024   HGB 10.8 (L) 03/20/2024   HCT 33.5 (L) 03/20/2024   MCV 87.9 03/20/2024   PLT 240 03/20/2024   Lab Results  Component Value Date   CHOL 210 (H) 08/20/2023   HDL 73 08/20/2023   LDLCALC 117 (H) 08/20/2023   TRIG 95 08/20/2023   CHOLHDL 2.9 08/20/2023   Lab Results  Component Value Date   HGBA1C 5.4 08/20/2023    Lab Results  Component Value Date   TSH 3.04 08/20/2023    Assessment & Plan:   Meds ordered this encounter  Medications   losartan  (COZAAR ) 100 MG tablet    Sig: Take 1 tablet (100 mg total) by mouth daily.    Dispense:  90 tablet    Refill:  1   Elevated Blood Pressure: initially seen for BP Elevation on 5/06, subsequently on 5/20, her Losartan  has been increased from 25 mg to 100 mg daily since and she denies any side effects from increased dosage. Blood Pressure: elevated initially today at 140/82, 20 minutes later normalized to 130/78. Continue to monitor BP at home and let me know if persistently elevated.  Rheumatoid Arthritis managed with Plaquenil  200 mg twice daily except on Saturday & Sunday, on 5/27 started Methotrexate  20 mg weekly, and on 6/17 started a Prednisone  tapering course x 16 days - she says today that her pain has  been more well managed since starting the Methotrexate  and while taking the Prednisone , and she follows up with Rheumatology in July.   Return in 5 months (on 08/22/2024) for annual labs, and then on 02/22/2024 for annual visit, or as needed.    I,Emily Lagle,acting as a Neurosurgeon for Ronal JINNY Hailstone, MD.,have documented all relevant documentation on the behalf of Ronal JINNY Hailstone, MD,as directed by  Ronal JINNY Hailstone, MD while in the presence of Ronal JINNY Hailstone, MD.   I, Ronal JINNY Hailstone, MD, have reviewed all documentation for this visit. The documentation on 04/01/24 for the exam, diagnosis, procedures, and orders are all accurate and complete.

## 2024-03-27 ENCOUNTER — Ambulatory Visit: Attending: Cardiovascular Disease | Admitting: Cardiovascular Disease

## 2024-03-27 ENCOUNTER — Encounter: Payer: Self-pay | Admitting: Cardiovascular Disease

## 2024-03-27 VITALS — BP 186/88 | HR 73 | Ht 64.0 in | Wt 168.0 lb

## 2024-03-27 DIAGNOSIS — Z950 Presence of cardiac pacemaker: Secondary | ICD-10-CM

## 2024-03-27 DIAGNOSIS — I495 Sick sinus syndrome: Secondary | ICD-10-CM

## 2024-03-27 LAB — CUP PACEART INCLINIC DEVICE CHECK
Battery Remaining Longevity: 85 mo
Battery Voltage: 3.01 V
Brady Statistic RA Percent Paced: 55 %
Brady Statistic RV Percent Paced: 0.62 %
Date Time Interrogation Session: 20250623150451
Implantable Lead Connection Status: 753985
Implantable Lead Connection Status: 753985
Implantable Lead Implant Date: 20220201
Implantable Lead Implant Date: 20220201
Implantable Lead Location: 753859
Implantable Lead Location: 753860
Implantable Pulse Generator Implant Date: 20220201
Lead Channel Impedance Value: 412.5 Ohm
Lead Channel Impedance Value: 550 Ohm
Lead Channel Pacing Threshold Amplitude: 0.625 V
Lead Channel Pacing Threshold Amplitude: 0.75 V
Lead Channel Pacing Threshold Amplitude: 0.75 V
Lead Channel Pacing Threshold Pulse Width: 0.5 ms
Lead Channel Pacing Threshold Pulse Width: 0.5 ms
Lead Channel Pacing Threshold Pulse Width: 0.5 ms
Lead Channel Sensing Intrinsic Amplitude: 3.4 mV
Lead Channel Sensing Intrinsic Amplitude: 9 mV
Lead Channel Setting Pacing Amplitude: 0.875
Lead Channel Setting Pacing Amplitude: 2 V
Lead Channel Setting Pacing Pulse Width: 0.5 ms
Lead Channel Setting Sensing Sensitivity: 2 mV
Pulse Gen Model: 2272
Pulse Gen Serial Number: 3896658

## 2024-03-27 NOTE — Patient Instructions (Signed)
 Medication Instructions:  Your physician recommends that you continue on your current medications as directed. Please refer to the Current Medication list given to you today.  *If you need a refill on your cardiac medications before your next appointment, please call your pharmacy*  Follow-Up: At [facility], you and your health needs are our priority.  As part of our continuing mission to provide you with exceptional heart care, our providers are all part of one team.  This team includes your primary Cardiologist (physician) and Advanced Practice Providers or APPs (Physician Assistants and Nurse Practitioners) who all work together to provide you with the care you need, when you need it.  Your next appointment:   2 years  Provider:   You will see one of the following Advanced Practice Providers on your designated Care Team:   Mertha Abrahams, Kennard Pea [address] [place], PA-C Suzann Riddle, NP Creighton Doffing, NP

## 2024-03-27 NOTE — Progress Notes (Signed)
  Electrophysiology Office Note:    Date:  03/27/2024   ID:  Julia, Solis [birth year], MRN [id number]  PCP:  Julia Ronal PARAS, MD   [facility] Providers Cardiologist:  None     Referring MD: Julia Ronal PARAS, MD   History of Present Illness:    Julia Solis is a 73 y.o. female with a medical history significant for sick sinus syndrome, who presents for device follow-up.     She has a [employer] MRI dual-chamber pacemaker placed by Dr. Kelsie on November 05, 2020 for sick sinus syndrome.     Interval history  Since her last clinic visit, she has been doing well. she has no device related complaints -- no new tenderness, drainage, redness.   EKGs/Labs/Other Studies Reviewed Today:    Echocardiogram:  TTE 06/22/2022 EF 60-65%; mildly dilated LA; mildly elevated pulmonary artery pressures   Monitors:  127/2021 HR 36-193, avg 64 Occasional brief SVT, longest 12 beats 2 symptom episodes of shortness of breath associated with sinus and junctional rhythm, rate 50 bpm.  5 sinus pauses, longest 3.6 seconds, asymptomatic.  Stress testing:  10/10/2020 - reviewed in Epic  Advanced imaging:  Cardiac catherization   EKG:   EKG Interpretation Date/Time:  Monday March 27 2024 14:24:16 EDT Ventricular Rate:  73 PR Interval:  178 QRS Duration:  78 QT Interval:  386 QTC Calculation: 425 R Axis:   23  Text Interpretation: Atrial-paced rhythm Cannot rule out Anterior infarct (cited on or before 23-Mar-2023) When compared with ECG of 08-Sep-2023 14:18, Electronic atrial pacemaker has replaced Sinus rhythm Confirmed by Nancey Scotts [id number]) on 03/27/2024 2:49:01 PM     Physical Exam:    VS:  BP (!) 186/88   Pulse 73   Ht 5' 4 (1.626 m)   Wt 168 lb (76.2 kg)   SpO2 97%   BMI 28.84 kg/m     Wt Readings from Last 3 Encounters:  03/27/24 168 lb (76.2 kg)  02/25/24 172 lb 6.4 oz (78.2 kg)  02/22/24 168 lb (76.2 kg)     GEN:  Well nourished,  well developed in no acute distress CARDIAC: RRR, no murmurs, rubs, gallops The device site is normal -- no tenderness, edema, drainage, redness, threatened erosion. RESPIRATORY:  Normal work of breathing MUSCULOSKELETAL: no edema    ASSESSMENT & PLAN:    Sick sinus syndrome Patient is not device dependent today   [employer] Jude dual-chamber pacemaker Normal function.  I reviewed the device interrogation in detail today.  See Paceart for report  Pulmonary sarcoidosis Slightly elevated pulmonary pressures on echocardiogram; normal RV function and size.  Sick sinus syndrome Patient is not device dependent today    Signed, Scotts FORBES Nancey, MD  03/27/2024 2:50 PM    [facility]

## 2024-03-28 DIAGNOSIS — M25552 Pain in left hip: Secondary | ICD-10-CM | POA: Diagnosis not present

## 2024-03-30 ENCOUNTER — Ambulatory Visit: Payer: Self-pay | Admitting: Cardiovascular Disease

## 2024-04-01 ENCOUNTER — Encounter: Payer: Self-pay | Admitting: Internal Medicine

## 2024-04-01 NOTE — Patient Instructions (Signed)
 Continue losartan  100 mg daily and monitor blood pressure readings. Call if persistently elevated blood pressure readings or other concerns. Medicare wellness visit scheduled for November.

## 2024-04-03 ENCOUNTER — Encounter: Payer: Self-pay | Admitting: Internal Medicine

## 2024-04-03 DIAGNOSIS — M25552 Pain in left hip: Secondary | ICD-10-CM | POA: Diagnosis not present

## 2024-04-05 ENCOUNTER — Other Ambulatory Visit: Payer: Self-pay | Admitting: *Deleted

## 2024-04-05 DIAGNOSIS — Z79899 Other long term (current) drug therapy: Secondary | ICD-10-CM

## 2024-04-05 DIAGNOSIS — M25552 Pain in left hip: Secondary | ICD-10-CM | POA: Diagnosis not present

## 2024-04-05 MED ORDER — ALENDRONATE SODIUM 70 MG PO TABS
70.0000 mg | ORAL_TABLET | ORAL | 0 refills | Status: DC
Start: 2024-04-05 — End: 2024-07-03

## 2024-04-05 NOTE — Telephone Encounter (Signed)
Ok to refill fosamax

## 2024-04-06 ENCOUNTER — Ambulatory Visit: Payer: Self-pay | Admitting: Physician Assistant

## 2024-04-06 LAB — CBC WITH DIFFERENTIAL/PLATELET
Absolute Lymphocytes: 1102 {cells}/uL (ref 850–3900)
Absolute Monocytes: 454 {cells}/uL (ref 200–950)
Basophils Absolute: 22 {cells}/uL (ref 0–200)
Basophils Relative: 0.2 %
Eosinophils Absolute: 97 {cells}/uL (ref 15–500)
Eosinophils Relative: 0.9 %
HCT: 35.1 % (ref 35.0–45.0)
Hemoglobin: 11.5 g/dL — ABNORMAL LOW (ref 11.7–15.5)
MCH: 29.3 pg (ref 27.0–33.0)
MCHC: 32.8 g/dL (ref 32.0–36.0)
MCV: 89.5 fL (ref 80.0–100.0)
MPV: 10.2 fL (ref 7.5–12.5)
Monocytes Relative: 4.2 %
Neutro Abs: 9126 {cells}/uL — ABNORMAL HIGH (ref 1500–7800)
Neutrophils Relative %: 84.5 %
Platelets: 214 10*3/uL (ref 140–400)
RBC: 3.92 10*6/uL (ref 3.80–5.10)
RDW: 14.3 % (ref 11.0–15.0)
Total Lymphocyte: 10.2 %
WBC: 10.8 10*3/uL (ref 3.8–10.8)

## 2024-04-06 LAB — COMPREHENSIVE METABOLIC PANEL WITH GFR
AG Ratio: 1.9 (calc) (ref 1.0–2.5)
ALT: 8 U/L (ref 6–29)
AST: 13 U/L (ref 10–35)
Albumin: 3.8 g/dL (ref 3.6–5.1)
Alkaline phosphatase (APISO): 57 U/L (ref 37–153)
BUN: 22 mg/dL (ref 7–25)
CO2: 26 mmol/L (ref 20–32)
Calcium: 8.3 mg/dL — ABNORMAL LOW (ref 8.6–10.4)
Chloride: 106 mmol/L (ref 98–110)
Creat: 0.68 mg/dL (ref 0.60–1.00)
Globulin: 2 g/dL (ref 1.9–3.7)
Glucose, Bld: 105 mg/dL — ABNORMAL HIGH (ref 65–99)
Potassium: 4.6 mmol/L (ref 3.5–5.3)
Sodium: 140 mmol/L (ref 135–146)
Total Bilirubin: 0.4 mg/dL (ref 0.2–1.2)
Total Protein: 5.8 g/dL — ABNORMAL LOW (ref 6.1–8.1)
eGFR: 92 mL/min/{1.73_m2} (ref >=60)

## 2024-04-06 NOTE — Progress Notes (Signed)
 Calcium  is low-8.3-please clarify if she is taking a calcium  and vitamin d  supplement?  Total protein remains low but stable. Rest of CMP WNL.   Hemoglobin remains low but has improved.   Absolute neutrophils are slightly elevated. Could be related to inflammation or if she has had a recent infection.  Rest of CBC WNL.we will continue to monitor.

## 2024-04-10 ENCOUNTER — Ambulatory Visit: Payer: Self-pay | Admitting: *Deleted

## 2024-04-10 DIAGNOSIS — B349 Viral infection, unspecified: Secondary | ICD-10-CM | POA: Diagnosis not present

## 2024-04-10 DIAGNOSIS — J029 Acute pharyngitis, unspecified: Secondary | ICD-10-CM | POA: Diagnosis not present

## 2024-04-10 DIAGNOSIS — Z20822 Contact with and (suspected) exposure to covid-19: Secondary | ICD-10-CM | POA: Diagnosis not present

## 2024-04-10 DIAGNOSIS — R509 Fever, unspecified: Secondary | ICD-10-CM | POA: Diagnosis not present

## 2024-04-10 NOTE — Telephone Encounter (Signed)
 FYI Only or Action Required?: FYI only for provider.  Patient was last seen in primary care on 03/23/2024 by Perri Ronal PARAS, MD. Called Nurse Triage reporting Sore Throat. Symptoms began several days ago. Interventions attempted: OTC medications: tylenol  x 1. Symptoms are: gradually worsening.  Triage Disposition: See Physician Within 24 Hours  Patient/caregiver understands and will follow disposition?: Yes                     Copied from CRM [id number]. Topic: Clinical - Red Word Triage >> Apr 10, 2024  8:48 AM Rosina BIRCH wrote: Reason from CRM: sore throat, body aches and pain, temp 101.4 last night Reason for Disposition  Fever present > 3 days (72 hours)  Answer Assessment - Initial Assessment Questions 1. ONSET: When did the throat start hurting? (Hours or days ago)      Saturday  2. SEVERITY: How bad is the sore throat? (Scale 1-10; mild, moderate or severe)   - MILD (1-3):  Doesn't interfere with eating or normal activities.   - MODERATE (4-7): Interferes with eating some solids and normal activities.   - SEVERE (8-10):  Excruciating pain, interferes with most normal activities.   - SEVERE WITH DYSPHAGIA (10): Can't swallow liquids, drooling.     Moderate  3. STREP EXPOSURE: Has there been any exposure to strep within the past week? If Yes, ask: What type of contact occurred?      Na  4.  VIRAL SYMPTOMS: Are there any symptoms of a cold, such as a runny nose, cough, hoarse voice or red eyes?      Na  5. FEVER: Do you have a fever? If Yes, ask: What is your temperature, how was it measured, and when did it start?     Yes last night 101.4 6. PUS ON THE TONSILS: Is there pus on the tonsils in the back of your throat?     Na  7. OTHER SYMPTOMS: Do you have any other symptoms? (e.g., difficulty breathing, headache, rash)     Body aches, fever, sore throat. Hx RA has been taking steroids 8. PREGNANCY: Is there any chance you are pregnant? When was  your last menstrual period?     Na   Recommended UC due to fever last night and has not checked today . No available appt until 04/13/24  Protocols used: Sore Throat-A-AH

## 2024-04-10 NOTE — Telephone Encounter (Signed)
 I called patient, patient advised to hold Methotrexate  until next week to ensure all her symptoms have resolved, patient verbalized understanding.

## 2024-04-10 NOTE — Telephone Encounter (Signed)
 Please advise patient to hold methotrexate  until symptoms have completely resolved.  Ok to take tylenol  PRN while recovering from the infection especially since she will be holding methotrexate  during this time.

## 2024-04-10 NOTE — Telephone Encounter (Signed)
 She will need to wait until symptom resolution--fever free and no sore throat--prior to resuming methotrexate 

## 2024-04-12 ENCOUNTER — Ambulatory Visit: Payer: Self-pay | Admitting: Internal Medicine

## 2024-04-12 ENCOUNTER — Encounter: Payer: Self-pay | Admitting: Internal Medicine

## 2024-04-12 ENCOUNTER — Ambulatory Visit: Admitting: Internal Medicine

## 2024-04-12 VITALS — Ht 64.0 in | Wt 168.0 lb

## 2024-04-12 DIAGNOSIS — J029 Acute pharyngitis, unspecified: Secondary | ICD-10-CM

## 2024-04-12 DIAGNOSIS — Z862 Personal history of diseases of the blood and blood-forming organs and certain disorders involving the immune mechanism: Secondary | ICD-10-CM | POA: Diagnosis not present

## 2024-04-12 DIAGNOSIS — M0600 Rheumatoid arthritis without rheumatoid factor, unspecified site: Secondary | ICD-10-CM | POA: Diagnosis not present

## 2024-04-12 DIAGNOSIS — Z8543 Personal history of malignant neoplasm of ovary: Secondary | ICD-10-CM | POA: Diagnosis not present

## 2024-04-12 DIAGNOSIS — I10 Essential (primary) hypertension: Secondary | ICD-10-CM | POA: Diagnosis not present

## 2024-04-12 DIAGNOSIS — B349 Viral infection, unspecified: Secondary | ICD-10-CM

## 2024-04-12 DIAGNOSIS — Z950 Presence of cardiac pacemaker: Secondary | ICD-10-CM | POA: Diagnosis not present

## 2024-04-12 DIAGNOSIS — H905 Unspecified sensorineural hearing loss: Secondary | ICD-10-CM | POA: Diagnosis not present

## 2024-04-12 DIAGNOSIS — R509 Fever, unspecified: Secondary | ICD-10-CM

## 2024-04-12 DIAGNOSIS — H6692 Otitis media, unspecified, left ear: Secondary | ICD-10-CM

## 2024-04-12 LAB — CBC WITH DIFFERENTIAL/PLATELET
Absolute Lymphocytes: 1555 {cells}/uL (ref 850–3900)
Absolute Monocytes: 672 {cells}/uL (ref 200–950)
Basophils Absolute: 29 {cells}/uL (ref 0–200)
Basophils Relative: 0.3 %
Eosinophils Absolute: 250 {cells}/uL (ref 15–500)
Eosinophils Relative: 2.6 %
HCT: 33.2 % — ABNORMAL LOW (ref 35.0–45.0)
Hemoglobin: 10.6 g/dL — ABNORMAL LOW (ref 11.7–15.5)
MCH: 28.6 pg (ref 27.0–33.0)
MCHC: 31.9 g/dL — ABNORMAL LOW (ref 32.0–36.0)
MCV: 89.7 fL (ref 80.0–100.0)
MPV: 10.6 fL (ref 7.5–12.5)
Monocytes Relative: 7 %
Neutro Abs: 7094 {cells}/uL (ref 1500–7800)
Neutrophils Relative %: 73.9 %
Platelets: 211 Thousand/uL (ref 140–400)
RBC: 3.7 Million/uL — ABNORMAL LOW (ref 3.80–5.10)
RDW: 14.1 % (ref 11.0–15.0)
Total Lymphocyte: 16.2 %
WBC: 9.6 Thousand/uL (ref 3.8–10.8)

## 2024-04-12 LAB — POCT RESPIRATORY SYNCYTIAL VIRUS: RSV Rapid Ag: NEGATIVE

## 2024-04-12 MED ORDER — AZITHROMYCIN 250 MG PO TABS: ORAL_TABLET | ORAL | 0 refills | Status: DC

## 2024-04-12 MED ORDER — AMOXICILLIN 500 MG PO CAPS: 500.0000 mg | ORAL_CAPSULE | Freq: Three times a day (TID) | ORAL | 0 refills | Status: AC

## 2024-04-12 NOTE — Progress Notes (Signed)
 Office Visit Note  Patient: Julia Solis             Date of Birth: 1951/06/07           MRN: [id number]             PCP: Julia Ronal PARAS, MD Referring: Julia Ronal PARAS, MD Visit Date: 04/26/2024 Occupation: @GUAROCC @  Subjective:  Increased fatigue, generalized pain  History of Present Illness: Julia Solis is a 73 y.o. female with seronegative rheumatoid arthritis, sarcoidosis, osteoarthritis and osteoporosis.  She returns today after her last visit in May 2025.  She states she tried to come off prednisone  on July 3.  She states on July 5 she started having generalized achiness and low-grade fever.  She was also experiencing sore throat.  She had several visits with her PCP and there was no cause established for the her symptoms.  She was told that her symptoms could be coming from rheumatoid arthritis.  She also tried stopping methotrexate  and then resumed methotrexate  2 days ago after skipping 2 weeks of methotrexate .  She continues to take Plaquenil  200 mg p.o. twice daily Monday to Friday.  She has been off prednisone .  She has taken prednisone  off and on since February 2024.  She has been experiencing some shortness of breath.  Previous DEXA 11/05/21: T-score -2.6, BMD 0.756 AP spine.  DEXA updated on 01/08/2024:The BMD measured at AP Spine L1-L4 is 0.834 g/cm2 with a T-score of -2.9.  Patient was previously evaluated by endocrinology who recommended initiating Fosamax  70 mg 1 tablet by mouth once weekly.  The use of Fosamax  was discussed in detail at her last office visit at which time she plan to initiate therapy.  She has noticed some increased gas since initiating Fosamax  but has not noticed any reflux.  She has been taking Fosamax  70 mg p.o. weekly which she has been taking first 13 weeks.  Activities of Daily Living:  Patient reports morning stiffness for  all day .   Patient Reports nocturnal pain.  Difficulty dressing/grooming: Denies Difficulty climbing stairs:  Reports Difficulty getting out of chair: Reports Difficulty using hands for taps, buttons, cutlery, and/or writing: Reports  Review of Systems  Constitutional:  Positive for fatigue.  HENT:  Negative for mouth sores and mouth dryness.   Eyes:  Negative for dryness.  Respiratory:  Negative for difficulty breathing.   Cardiovascular:  Negative for chest pain and palpitations.  Gastrointestinal:  Negative for blood in stool, constipation and diarrhea.  Endocrine: Negative for increased urination.  Genitourinary:  Negative for involuntary urination.  Musculoskeletal:  Positive for joint pain, joint pain, joint swelling, myalgias, morning stiffness and myalgias. Negative for gait problem, muscle weakness and muscle tenderness.  Skin:  Negative for color change, rash, hair loss and sensitivity to sunlight.  Allergic/Immunologic: Positive for susceptible to infections.  Neurological:  Positive for headaches. Negative for dizziness.  Hematological:  Negative for swollen glands.  Psychiatric/Behavioral:  Positive for depressed mood. Negative for sleep disturbance. The patient is not nervous/anxious.     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   Dry eye syndrome of both lacrimal glands 10/16/2015   Elevated LDL cholesterol level 02/26/2015   Back pain 02/20/2014   Insomnia 02/20/2014   Allergic rhinitis 02/20/2014   Osteopenia 07/07/2012    Fatigue 08/17/2011   Nonspecific (abnormal) findings on radiological and other examination of body structure 06/24/2010   ABNORMAL LUNG XRAY 06/24/2010   History of ovarian cancer 06/23/2010    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   Complication of anesthesia    GERD (gastroesophageal reflux disease)    Hearing aid worn    both ears   History of chemotherapy 2011   History of hiatal hernia    History of kidney stones    Hypertension    MGUS (monoclonal gammopathy of unknown significance)    dx by hematology per patient   Neuromuscular disorder (HCC)    left foot numbness since  eback surgery   Osteoporosis    PONV (postoperative nausea and vomiting)    Presence of permanent cardiac pacemaker    Sarcoidosis    in remission sees pumonary  once a year dr bellinger lov 08-21-2020 care everywhere   Sick sinus syndrome (HCC)    Wears glasses     Family History  Problem Relation Age of Onset   Hypertension Mother    Prostate cancer Father 71   Cancer Father    Hypertension Father    Stroke Father    Colon polyps Sister    Colon cancer Maternal Grandmother        dx in her 79s   Heart disease Maternal Grandfather    Colon cancer Paternal Grandmother    Heart disease Paternal Grandfather    Breast cancer Paternal Aunt 34   Colon cancer Paternal Aunt        dx in her 39s   Melanoma Cousin 35   Breast cancer Cousin 11       multiple cousins   Colon polyps Cousin    Breast cancer Cousin 57   Breast cancer Cousin        father's maternal 1st cousin   Breast cancer Cousin        Father's first cousin's daughter   Cancer Other        maternal great uncle with Cancer - NOS   Cancer Other        maternal great grandmother with either colon or stomach cancer   Breast cancer Other        fathers maternal aunt   Past Surgical History:  Procedure Laterality Date   ABDOMINAL HYSTERECTOMY  2011   APPENDECTOMY  2011    ?with hysterectomy   BACK  SURGERY  1997   lumbar   CARPAL TUNNEL RELEASE Right    CATARACT EXTRACTION Right 08/10/2023   CATARACT EXTRACTION Left 08/24/2023   CESAREAN SECTION  1982, 1985   COSMETIC SURGERY  2007   brow lift   CYSTOSCOPY WITH RETROGRADE PYELOGRAM, URETEROSCOPY AND STENT PLACEMENT Left 02/04/2021   Procedure: CYSTOSCOPY WITH RETROGRADE PYELOGRAM, URETEROSCOPY AND STENT PLACEMENT;  Surgeon: Rosalind Zachary NOVAK, MD;  Location: [facility];  Service: Urology;  Laterality: Left;  1 HR; nephrolithiasis   ECTOPIC PREGNANCY SURGERY  1980   EXTRACORPOREAL SHOCK WAVE LITHOTRIPSY Left 06/23/2018   Procedure: LEFT EXTRACORPOREAL SHOCK WAVE LITHOTRIPSY (ESWL);  Surgeon: Sherrilee Belvie CROME, MD;  Location: [facility];  Service: Urology;  Laterality: Left;   GANGLION CYST EXCISION  1975   HOLMIUM LASER APPLICATION Left 02/04/2021   Procedure: HOLMIUM LASER APPLICATION;  Surgeon: Rosalind Zachary NOVAK, MD;  Location: [facility];  Service: Urology;  Laterality: Left;   PACEMAKER IMPLANT N/A 11/13/2020   Procedure: PACEMAKER IMPLANT;  Surgeon: Kelsie Agent, MD;  Location: [facility];  Service: Cardiovascular;  Laterality: N/A;   TONSILLECTOMY  1957   adenoids removed   Social History   Social History Narrative   Not on file   Immunization History  Administered Date(s) Administered   Fluad Quad(high Dose 65+) 06/22/2019, 06/15/2022   Fluad Trivalent(High Dose 65+) 07/06/2023   Influenza Nasal 07/06/2023   Influenza Split 07/07/2011, 07/07/2012, 07/04/2013   Influenza, High Dose Seasonal PF 06/22/2019, 07/01/2020, 06/17/2021, 12/16/2021   Influenza,inj,Quad PF,6+ Mos 07/09/2018   Influenza-Unspecified 07/05/2014, 06/26/2015, 07/05/2016, 07/29/2017   Moderna Covid-19 Fall Seasonal Vaccine 34yrs & older 01/24/2024   Moderna Sars-Covid-2 Vaccination 11/07/2019, 12/07/2019, 07/29/2020, 07/06/2023   PFIZER Comirnaty(Gray Top)Covid-19 Tri-Sucrose Vaccine 01/11/2021   Pfizer Covid-19  Vaccine Bivalent Booster 42yrs & up 06/17/2021, 01/27/2022   Pneumococcal Conjugate-13 02/26/2015, 04/19/2017   Pneumococcal Polysaccharide-23 10/05/2009, 07/11/2018, 12/16/2021   Respiratory Syncytial Virus Vaccine,Recomb Aduvanted(Arexvy) 07/16/2022   Tdap 06/05/2009, 03/19/2020   Unspecified SARS-COV-2 Vaccination 06/23/2022   Zoster Recombinant(Shingrix) 07/20/2023   Zoster, Live 10/05/2009     Objective: Vital Signs: BP 125/74 (BP Location: Left Arm, Patient Position: Sitting, Cuff Size: Normal)   Pulse 86   Resp 16   Ht 5' 4 (1.626 m)   Wt 173 lb (78.5 kg)   BMI 29.70 kg/m    Physical Exam Vitals and nursing note reviewed.  Constitutional:      Appearance: She is well-developed.  HENT:     Head: Normocephalic and atraumatic.  Eyes:     Conjunctiva/sclera: Conjunctivae normal.  Cardiovascular:     Rate and Rhythm: Normal rate and regular rhythm.     Heart sounds: Normal heart sounds.  Pulmonary:     Effort: Pulmonary effort is normal.     Breath sounds: Normal breath sounds.  Abdominal:     General: Bowel sounds are normal.     Palpations: Abdomen is soft.  Musculoskeletal:     Cervical back: Normal range of motion.  Lymphadenopathy:     Cervical: No cervical adenopathy.  Skin:    General: Skin is warm and dry.     Capillary Refill: Capillary refill takes less than 2 seconds.  Neurological:     Mental Status: She is alert and oriented to person, place, and time.  Psychiatric:        Behavior: Behavior normal.      Musculoskeletal Exam: She good range of motion of the cervical spine.  Shoulders, elbows were in good range of motion.  She is synovitis over her wrist joints, MCPs and PIPs joints.  Hip joints and knee joints with good range of motion.  There is swelling over the right ankle joint.  Left ankle joint was in good range of motion.  There was no tenderness over MTPs.  CDAI Exam: CDAI Score: 42  Patient Global: 60 / 100; Provider Global: 60 /  100 Swollen: 16 ; Tender: 16  Joint Exam 04/26/2024      Right  Left  Wrist  Swollen Tender  Swollen Tender  MCP 2  Swollen Tender  Swollen Tender  MCP 3  Swollen Tender  Swollen Tender  MCP 4  Swollen Tender  Swollen Tender  MCP 5  Swollen Tender     PIP 2 (finger)  Swollen Tender  Swollen Tender  PIP 3 (finger)  Swollen Tender  Swollen Tender  PIP 4 (finger)  Swollen Tender  Swollen Tender  Ankle  Swollen Tender        Investigation: No additional findings.  Imaging: CUP PACEART INCLINIC DEVICE CHECK Result Date: 03/27/2024 Normal in-clinic dual chamber pacemaker check. Presenting Rhythm: AS/VS 68 . Routine testing of thresholds, sensing, and impedance demonstrate stable parameters and no programming changes needed at this time. No episodes. Estimated longevity 7 years . Pt  enrolled in remote follow-up.Rozelle Banter, BSN, RN   Recent Labs: Lab Results  Component Value Date   WBC 12.1 (H) 04/25/2024   HGB 10.0 (L) 04/25/2024   PLT 379 04/25/2024   NA 138 04/21/2024   K 4.2 04/21/2024   CL 104 04/21/2024   CO2 24 04/21/2024   GLUCOSE 101 (H) 04/21/2024   BUN 20 04/21/2024   CREATININE 0.95 04/21/2024   BILITOT 0.3 04/21/2024   ALKPHOS 68 01/20/2024   AST 14 04/21/2024   ALT 9 04/21/2024   PROT 5.6 (L) 04/21/2024   ALBUMIN 3.7 01/20/2024   CALCIUM  9.0 04/21/2024   GFRAA 93 10/28/2020   QFTBGOLDPLUS NEGATIVE 07/06/2023    Speciality Comments: PLQ EYE EXAM 09/22/2023 normal [facility] f/u 12 months.  Procedures:  No procedures performed Allergies: Augmentin  [amoxicillin -pot clavulanate], Flomax [tamsulosin hcl], Latex, Tramadol , and Ciprofloxacin   Assessment / Plan:     Visit Diagnoses: Seronegative rheumatoid arthritis (HCC) - Ultrasound performed yesterday on 02/24/2024--positive for synovitis and tenosynovitis involving both hands.  Patient presented today in severe flare of rheumatoid arthritis with pain and swelling involving multiple  joints.  She states the symptoms started after she got off prednisone .  She states she also came off methotrexate  as she was concerned that she was having an infection.  She was evaluated by her PCP and there were no signs of infection.  She resumed methotrexate  last week after stopping it for 2 weeks.  She had synovitis in multiple joints as described above.  I did detailed discussion with the patient regarding the side effects of prednisone  use.  After indication side effects contraindications were discussed she was placed on prednisone  taper starting at 10 mg and taper by 5 mg every 2 weeks.  She is not doing as well on oral methotrexate .  I also discussed the option of switching her to subcu methotrexate .  She was in agreement.  She was advised to discontinue oral methotrexate .  We sent a prescription for subcutaneous methotrexate  is starting at 0.8 mL subcu weekly along with folic acid  2 mg p.o. daily.  High risk medication use - Plaquenil  200 mg p.o. twice daily Monday to Friday, methotrexate  8 tablets p.o. weekly, folic acid  2 mg p.o. daily.  Patient was advised to get labs in a month after starting on subcutaneous and methotrexate .  Will get labs every 3 months after that.  Information reimmunization was placed in the AVS.  She was advised to hold methotrexate  if she develops an infection and resume after the infection resolves.  Positive ANA (antinuclear antibody) - low titer positive ANA, ENA panel and complements are normal.  No clinical features of systemic lupus.  Current chronic use of systemic steroids - Recurrent use of prednisone  since February 2024.  She has been off and on prednisone --most recent taper was discontinued on 01/13/2024.  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  She remains under the care of pulmonology yearly.  Her chest x-ray from December 2024 showed no active disease.  Bronchiectasis was noted which was unchanged.  Dry eye syndrome of  both eyes -she continues to have dry eye symptoms.  She is using Systane eyedrops twice daily.  Age-related osteoporosis without current pathological fracture- DEXA 11/05/21: T-score -2.6, BMD 0.756 AP spine. DEXA updated on 01/08/2024:The BMD measured at AP Spine L1-L4 is 0.834 g/cm2 with a T-score of -2.9.  She started Fosamax  70 mg p.o. weekly after the last visit.  She has been on Fosamax  for the last 13 weeks and has not been experiencing any side effects.  Other medical problems are listed as follows:  MGUS (monoclonal gammopathy of unknown significance)  Other eczema  Other fatigue  Malignant neoplasm of ovary, unspecified laterality (HCC)  Pacemaker  Symptomatic sinus bradycardia  Pulmonary hypertension (HCC)  Obstructive sleep apnea  Salzmann's nodular dystrophy of both eyes  Abnormal SPEP  Other insomnia  Orders: No orders of the defined types were placed in this encounter.  Meds ordered this encounter  Medications   TUBERCULIN SYR 1CC/27GX1/2 (B-D TB SYRINGE 1CC/27GX1/2) 27G X 1/2 1 ML MISC    Sig: Inject 1 Syringe into the skin once a week.    Dispense:  12 each    Refill:  3   predniSONE  (DELTASONE ) 5 MG tablet    Sig: Take 10 mg (2 tablets) by mouth once daily for two weeks, then 7.5 mg (1.5 tablets) by mouth daily for two weeks, then 5 mg (1 tablet) by mouth daily for two weeks, the 2.5 mg (0.5 tablets) by mouth for two weeks.    Dispense:  70 tablet    Refill:  0   methotrexate  50 MG/2ML injection    Sig: Inject 0.8 mLs (20 mg total) into the skin once a week for 8 doses.    Dispense:  10 mL    Refill:  0     Follow-Up Instructions: Return in about 2 months (around 06/27/2024) for Rheumatoid arthritis.   Maya Nash, MD  Note - This record has been created using Animal nutritionist.  Chart creation errors have been sought, but may not always  have been located. Such creation errors do not reflect on  the standard of medical care.

## 2024-04-12 NOTE — Progress Notes (Signed)
   Subjective:    Patient ID: Julia Solis, female    DOB: [birth year], 73 y.o.   MRN: [id number]  HPI Patient seen today with persistent  fever after visit to [facility] center on Saturday July 5th. Symptoms started with mild sore throat and myalgias on Saturday July 5th. Denied runny nose or cough. No known exposure to sick individuals and no recent travel history.The following day symptoms were worse and she had fever 101.4 degrees. Went to [facility] here in [place] on July 7th where flu, strep, and Covid tests were negative. Rheumatologist has advised her her to hold Methotrexate  until symptoms resolve.Has decreased energy. No shaking chills nausea or vomiting.      Review of Systems see above,     Objective:   Physical Exam  VS reviewed. She looks fatigued but not in respiratory distress. Left TM bulging and is pink but not red. Neck supple. No cervical adenopathy. Chest is clear without rales or wheezing.      Assessment & Plan:   Has negative RSV test today- has been vaccinated  Has acute left serous otitis media with bulging left TM which is pink- not red   Suspect Viral syndrome   CBC with diff shows WBC 9600, Hgb 10.6 grams. Generally Hgb runs 11.2-11.5 grams. Lower Hgb may be due to recent blood draw. Platelet count is normal and diff is normal. Says she generally responds better to Amoxicillin  than Zithromax . Will switch to Amoxicillin  500 mg 3 times daily for 7 days.  Seronegative Rheumatoid arthritis followed by [facility]  Pacemaker  Hx of Sarcoidosis  Hearing loss  Osteoporosis  Hx of sick sinus syndrome  Patient will rest at home and stay out of hot weather. Will contact us  if symptoms worsen or fails to improve.  IRonal JINNY Hailstone, MD, have reviewed all documentation for this visit. The documentation on 04/16/24 for the exam, diagnosis, procedures, and orders are all accurate and complete.

## 2024-04-13 ENCOUNTER — Other Ambulatory Visit

## 2024-04-13 DIAGNOSIS — D649 Anemia, unspecified: Secondary | ICD-10-CM

## 2024-04-13 LAB — IRON,TIBC AND FERRITIN PANEL
%SAT: 12 % — ABNORMAL LOW (ref 16–45)
Ferritin: 95 ng/mL (ref 16–288)
Iron: 24 ug/dL — ABNORMAL LOW (ref 45–160)
TIBC: 201 ug/dL — ABNORMAL LOW (ref 250–450)

## 2024-04-13 LAB — FOLATE: Folate: 19.6 ng/mL

## 2024-04-13 LAB — RETICULOCYTES
ABS Retic: 67320 {cells}/uL (ref 20000–80000)
Retic Ct Pct: 1.8 %

## 2024-04-13 LAB — VITAMIN B12: Vitamin B-12: 343 pg/mL (ref 200–1100)

## 2024-04-14 ENCOUNTER — Ambulatory Visit: Payer: Self-pay | Admitting: Internal Medicine

## 2024-04-16 NOTE — Patient Instructions (Addendum)
 Discontinue Zpak and take Amoxicillin  500 mg 3 times daily for 10 days. Suspect viral syndrome. RSV testing today is negative. Rest, stay out of hot weather, stay well hydrated. Call us  if symptoms worsen or faily to improve.

## 2024-04-17 ENCOUNTER — Encounter: Payer: Self-pay | Admitting: Internal Medicine

## 2024-04-17 ENCOUNTER — Other Ambulatory Visit

## 2024-04-18 ENCOUNTER — Other Ambulatory Visit (INDEPENDENT_AMBULATORY_CARE_PROVIDER_SITE_OTHER): Payer: Self-pay

## 2024-04-18 ENCOUNTER — Other Ambulatory Visit: Payer: Self-pay | Admitting: Internal Medicine

## 2024-04-18 ENCOUNTER — Ambulatory Visit: Payer: Self-pay | Admitting: Internal Medicine

## 2024-04-18 DIAGNOSIS — D649 Anemia, unspecified: Secondary | ICD-10-CM | POA: Diagnosis not present

## 2024-04-18 LAB — POC HEMOCCULT BLD/STL (HOME/3-CARD/SCREEN)
Card #2 Fecal Occult Blod, POC: NEGATIVE
Card #3 Fecal Occult Blood, POC: NEGATIVE
Fecal Occult Blood, POC: NEGATIVE

## 2024-04-19 NOTE — Telephone Encounter (Signed)
 Reviewed with Dr. Royce a repeat evaluation by PCP.  We do not recommend restarting methotrexate  while febrile.

## 2024-04-21 ENCOUNTER — Ambulatory Visit: Admitting: Internal Medicine

## 2024-04-21 ENCOUNTER — Encounter: Payer: Self-pay | Admitting: Internal Medicine

## 2024-04-21 VITALS — BP 110/70 | HR 80 | Temp 98.3°F | Ht 64.0 in | Wt 171.0 lb

## 2024-04-21 DIAGNOSIS — R509 Fever, unspecified: Secondary | ICD-10-CM | POA: Diagnosis not present

## 2024-04-21 DIAGNOSIS — I10 Essential (primary) hypertension: Secondary | ICD-10-CM

## 2024-04-21 DIAGNOSIS — J029 Acute pharyngitis, unspecified: Secondary | ICD-10-CM | POA: Diagnosis not present

## 2024-04-21 DIAGNOSIS — Z8543 Personal history of malignant neoplasm of ovary: Secondary | ICD-10-CM | POA: Diagnosis not present

## 2024-04-21 DIAGNOSIS — M0600 Rheumatoid arthritis without rheumatoid factor, unspecified site: Secondary | ICD-10-CM | POA: Diagnosis not present

## 2024-04-21 MED ORDER — CEFTRIAXONE SODIUM 1 G IJ SOLR
1.0000 g | Freq: Once | INTRAMUSCULAR | Status: AC
  Administered 2024-04-21: 1 g via INTRAMUSCULAR

## 2024-04-21 MED ORDER — LEVOFLOXACIN 500 MG PO TABS: 500.0000 mg | ORAL_TABLET | Freq: Every day | ORAL | 0 refills | Status: AC

## 2024-04-21 NOTE — Progress Notes (Addendum)
 Patient Care Team: Perri Ronal PARAS, MD as PCP - General (Internal Medicine) Mealor, Eulas BRAVO, MD as Consulting Physician (Cardiology) Dolphus Reiter, MD as Consulting Physician (Rheumatology)  Visit Date: 04/21/24  Subjective:   Chief Complaint  Patient presents with   Sore Throat   Jaw Pain   Headache   Fever   Patient PI:Julia Solis, Julia Solis DOB:[birth year],73 y.o. FMW:[id number]   73 y.o.Female presents today for 1 week follow-up for Persistent Fever. Patient has a past medical history of Iron Deficiency with Hgb 10.6 on 7/09; Rheumatoid Arthritis treated with Methotrexate . Initially seen for this on 7/09 after visit to [facility] at [facility] center on Saturday July 7th where flu, strep, and Covid tests were negative. Was prescribed Amoxicillin  500 mg to take three time daily x 10 days, which unfortunately did not resolve her symptoms as she reached out 7/14 saying she was not having any improvement. Today in office, she says that she is still having fevers daily, usually worse in the early mornings, general myalgias (especially in the jaw, temples, and arms), sore throat, and now is having some swelling of her left forearm. Since she has temporarily halted her Methotrexate  due to her symptoms, she says she has been managing her myalgias and pain with Meloxicam , which has relieved her pain some. Denies headache.  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   Complication of anesthesia    GERD (gastroesophageal reflux disease)    Hearing aid worn    both ears   History of chemotherapy 2011   History of hiatal hernia    History of kidney stones    Hypertension    MGUS (monoclonal gammopathy of unknown significance)    dx by hematology per patient   Neuromuscular disorder (HCC)    left foot numbness since  eback surgery   Osteoporosis    PONV (postoperative nausea and vomiting)    Presence of permanent cardiac pacemaker     Sarcoidosis    in remission sees pumonary  once a year dr bellinger lov 08-21-2020 care [facility]   Sick sinus syndrome (HCC)    Wears glasses     Allergies  Allergen Reactions   Augmentin  [Amoxicillin -Pot Clavulanate]     Diarrhea    Flomax [Tamsulosin Hcl] Other (See Comments)    Dizziness   Latex Other (See Comments)    Skin irritation   Tramadol  Other (See Comments)    jittery, insomnia   Ciprofloxacin Rash    Family History  Problem Relation Age of Onset   Hypertension Mother    Prostate cancer Father 56   Cancer Father    Hypertension Father    Stroke Father    Colon polyps Sister    Colon cancer Maternal Grandmother        dx in her 24s   Heart disease Maternal Grandfather    Colon cancer Paternal Grandmother    Heart disease Paternal Grandfather    Breast cancer Paternal Aunt 41   Colon cancer Paternal Aunt        dx in her 44s   Melanoma Cousin 94   Breast cancer Cousin 8       multiple cousins   Colon polyps Cousin    Breast cancer Cousin 73   Breast cancer Cousin        father's maternal 1st cousin   Breast cancer Cousin        Father's first cousin's daughter  Cancer Other        maternal great uncle with Cancer - NOS   Cancer Other        maternal great grandmother with either colon or stomach cancer   Breast cancer Other        fathers maternal aunt   Social Hx: resides alone. Divorced. Has 2 adult daughters.Did smoke from ages [age]. Social alcohol consumption. 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  Review of Systems  Constitutional:  Positive for fever.  HENT:  Positive for sore throat.   Musculoskeletal:  Positive for myalgias (general).  Skin:        (+) Swelling, left arm  Neurological:  Negative for headaches.     Objective:  Vitals: BP 110/70   Pulse 80   Temp 98.3 F (36.8 C)   Ht 5' 4 (1.626 m)   Wt 171 lb (77.6 kg)   SpO2 98%   BMI 29.35 kg/m   Physical Exam Vitals and nursing note reviewed.   Constitutional:      General: She is not in acute distress.    Appearance: Normal appearance. She is not ill-appearing.  HENT:     Head: Normocephalic and atraumatic.     Right Ear: Tympanic membrane, ear canal and external ear normal. Decreased hearing (bilateral hearing aids) noted.     Left Ear: Ear canal and external ear normal. Decreased hearing (bilateral hearing aids) noted. Tympanic membrane is injected and erythematous (pink).     Mouth/Throat:     Mouth: Mucous membranes are moist.     Pharynx: Posterior oropharyngeal erythema (slight) present. No oropharyngeal exudate.  Pulmonary:     Effort: Pulmonary effort is normal.     Breath sounds: Normal breath sounds. No wheezing, rhonchi or rales.  Musculoskeletal:     Left forearm: Swelling present.  Lymphadenopathy:     Cervical: No cervical adenopathy.  Skin:    General: Skin is warm and dry.  Neurological:     Mental Status: She is alert and oriented to person, place, and time. Mental status is at baseline.  Psychiatric:        Mood and Affect: Mood normal.        Behavior: Behavior normal.        Thought Content: Thought content normal.        Judgment: Judgment normal.    Results:  Studies Obtained And Personally Reviewed By Me: Labs:     Component Value Date/Time   NA 140 04/05/2024 1526   NA 144 10/28/2020 1636   K 4.6 04/05/2024 1526   CL 106 04/05/2024 1526   CO2 26 04/05/2024 1526   GLUCOSE 105 (H) 04/05/2024 1526   BUN 22 04/05/2024 1526   BUN 16 10/28/2020 1636   CREATININE 0.68 04/05/2024 1526   CALCIUM  8.3 (L) 04/05/2024 1526   PROT 5.8 (L) 04/05/2024 1526   ALBUMIN 3.7 01/20/2024 1310   AST 13 04/05/2024 1526   ALT 8 04/05/2024 1526   ALKPHOS 68 01/20/2024 1310   BILITOT 0.4 04/05/2024 1526   GFRNONAA >60 01/20/2024 1310   GFRNONAA 82 07/11/2020 0949   GFRAA 93 10/28/2020 1636   GFRAA 95 07/11/2020 0949    Lab Results  Component Value Date   WBC 9.6 04/12/2024   HGB 10.6 (L) 04/12/2024    HCT 33.2 (L) 04/12/2024   MCV 89.7 04/12/2024   PLT 211 04/12/2024   Lab Results  Component Value Date   CHOL 210 (H) 08/20/2023  HDL 73 08/20/2023   LDLCALC 117 (H) 08/20/2023   TRIG 95 08/20/2023   CHOLHDL 2.9 08/20/2023   Lab Results  Component Value Date   HGBA1C 5.4 08/20/2023    Lab Results  Component Value Date   TSH 3.04 08/20/2023    Assessment & Plan:   Meds ordered this encounter  Medications   cefTRIAXone  (ROCEPHIN ) injection 1 g   levofloxacin  (LEVAQUIN ) 500 MG tablet    Sig: Take 1 tablet (500 mg total) by mouth daily for 7 days.    Dispense:  7 tablet    Refill:  0    Patient says she is not sure she is allergic to Cipro as she was on chemotherapy at the time as well. She had Levaquin  in March 2025 by Dr. Karis and did fine,   Orders Placed This Encounter  Procedures   CBC with Differential/Platelet   COMPLETE METABOLIC PANEL WITHOUT GFR   Mononucleosis screen   Sedimentation rate   Persistent Fever: initially seen for this on 7/09 after visit to [facility] center on Saturday July 7th where flu, strep, and Covid tests were negative. Was prescribed Amoxicillin  500 mg to take three time daily x 10 days, which unfortunately did not resolve her symptoms as she reached out 7/14 saying she was not having any improvement. Physical exam with left TM injected and lightly erythematous, slightly erythema posterior pharynx, and left forearm swelling noted. Given Rocephin  1 g IM today in-office. Sending in 500 mg Levaquin  - take 1 tablet daily with a meal for 7 days. Ordering Mono screening. Stay well rested, well hydrated, and well nourished. Contact us  if symptoms worsen/persist despite treatment. WBC is 12,100. Sed rate is 65.  Iron Deficiency with Hgb 10.6 on 7/09. Repeat Hgb is 10.0 grams  Rheumatoid Arthritis treated with Methotrexate  20 mg weekly. Has  left fore arm swelling. Followed by Rheumatology    I,Emily Lagle,acting as a scribe  for Ronal JINNY Hailstone, MD.,have documented all relevant documentation on the behalf of Ronal JINNY Hailstone, MD,as directed by  Ronal JINNY Hailstone, MD while in the presence of Ronal JINNY Hailstone, MD.   I, Ronal JINNY Hailstone, MD, have reviewed all documentation for this visit. The documentation on 04/30/24 for the exam, diagnosis, procedures, and orders are all accurate and complete.IRonal JINNY Hailstone, MD, have reviewed all documentation for this visit. The documentation on 04/30/24 for the exam, diagnosis, procedures, and orders are all accurate and complete.

## 2024-04-22 ENCOUNTER — Ambulatory Visit: Payer: Self-pay | Admitting: Internal Medicine

## 2024-04-22 LAB — CBC WITH DIFFERENTIAL/PLATELET
Absolute Lymphocytes: 1638 {cells}/uL (ref 850–3900)
Absolute Monocytes: 838 {cells}/uL (ref 200–950)
Basophils Absolute: 26 {cells}/uL (ref 0–200)
Basophils Relative: 0.2 %
Eosinophils Absolute: 275 {cells}/uL (ref 15–500)
Eosinophils Relative: 2.1 %
HCT: 33.8 % — ABNORMAL LOW (ref 35.0–45.0)
Hemoglobin: 10.9 g/dL — ABNORMAL LOW (ref 11.7–15.5)
MCH: 29.2 pg (ref 27.0–33.0)
MCHC: 32.2 g/dL (ref 32.0–36.0)
MCV: 90.6 fL (ref 80.0–100.0)
MPV: 10.1 fL (ref 7.5–12.5)
Monocytes Relative: 6.4 %
Neutro Abs: 10323 {cells}/uL — ABNORMAL HIGH (ref 1500–7800)
Neutrophils Relative %: 78.8 %
Platelets: 362 Thousand/uL (ref 140–400)
RBC: 3.73 Million/uL — ABNORMAL LOW (ref 3.80–5.10)
RDW: 13.9 % (ref 11.0–15.0)
Total Lymphocyte: 12.5 %
WBC: 13.1 Thousand/uL — ABNORMAL HIGH (ref 3.8–10.8)

## 2024-04-22 LAB — COMPLETE METABOLIC PANEL WITHOUT GFR
AG Ratio: 1.4 (calc) (ref 1.0–2.5)
ALT: 9 U/L (ref 6–29)
AST: 14 U/L (ref 10–35)
Albumin: 3.3 g/dL — ABNORMAL LOW (ref 3.6–5.1)
Alkaline phosphatase (APISO): 81 U/L (ref 37–153)
BUN: 20 mg/dL (ref 7–25)
CO2: 24 mmol/L (ref 20–32)
Calcium: 9 mg/dL (ref 8.6–10.4)
Chloride: 104 mmol/L (ref 98–110)
Creat: 0.95 mg/dL (ref 0.60–1.00)
Globulin: 2.3 g/dL (ref 1.9–3.7)
Glucose, Bld: 101 mg/dL — ABNORMAL HIGH (ref 65–99)
Potassium: 4.2 mmol/L (ref 3.5–5.3)
Sodium: 138 mmol/L (ref 135–146)
Total Bilirubin: 0.3 mg/dL (ref 0.2–1.2)
Total Protein: 5.6 g/dL — ABNORMAL LOW (ref 6.1–8.1)

## 2024-04-22 LAB — SEDIMENTATION RATE: Sed Rate: 65 mm/h — ABNORMAL HIGH (ref 0–30)

## 2024-04-22 LAB — MONONUCLEOSIS SCREEN: Heterophile, Mono Screen: NEGATIVE

## 2024-04-24 NOTE — Telephone Encounter (Signed)
 We will call her to schedule a visit for tomorrow

## 2024-04-25 ENCOUNTER — Ambulatory Visit: Payer: Self-pay | Admitting: Internal Medicine

## 2024-04-25 ENCOUNTER — Other Ambulatory Visit

## 2024-04-25 DIAGNOSIS — D72829 Elevated white blood cell count, unspecified: Secondary | ICD-10-CM

## 2024-04-25 LAB — CBC WITH DIFFERENTIAL/PLATELET
Absolute Lymphocytes: 1367 {cells}/uL (ref 850–3900)
Absolute Monocytes: 774 {cells}/uL (ref 200–950)
Basophils Absolute: 24 {cells}/uL (ref 0–200)
Basophils Relative: 0.2 %
Eosinophils Absolute: 194 {cells}/uL (ref 15–500)
Eosinophils Relative: 1.6 %
HCT: 31.9 % — ABNORMAL LOW (ref 35.0–45.0)
Hemoglobin: 10 g/dL — ABNORMAL LOW (ref 11.7–15.5)
MCH: 28.1 pg (ref 27.0–33.0)
MCHC: 31.3 g/dL — ABNORMAL LOW (ref 32.0–36.0)
MCV: 89.6 fL (ref 80.0–100.0)
MPV: 9.9 fL (ref 7.5–12.5)
Monocytes Relative: 6.4 %
Neutro Abs: 9741 {cells}/uL — ABNORMAL HIGH (ref 1500–7800)
Neutrophils Relative %: 80.5 %
Platelets: 379 Thousand/uL (ref 140–400)
RBC: 3.56 Million/uL — ABNORMAL LOW (ref 3.80–5.10)
RDW: 13.6 % (ref 11.0–15.0)
Total Lymphocyte: 11.3 %
WBC: 12.1 Thousand/uL — ABNORMAL HIGH (ref 3.8–10.8)

## 2024-04-25 NOTE — Telephone Encounter (Signed)
 We tried to fit the patient in today with Dr. Dolphus, but the patient requested to keep scheduled appointment with me tomorrow.

## 2024-04-26 ENCOUNTER — Encounter: Payer: Self-pay | Admitting: Rheumatology

## 2024-04-26 ENCOUNTER — Ambulatory Visit: Attending: Physician Assistant | Admitting: Rheumatology

## 2024-04-26 ENCOUNTER — Encounter: Payer: Self-pay | Admitting: Internal Medicine

## 2024-04-26 VITALS — BP 125/74 | HR 86 | Resp 16 | Ht 64.0 in | Wt 173.0 lb

## 2024-04-26 DIAGNOSIS — H04123 Dry eye syndrome of bilateral lacrimal glands: Secondary | ICD-10-CM | POA: Diagnosis not present

## 2024-04-26 DIAGNOSIS — Z950 Presence of cardiac pacemaker: Secondary | ICD-10-CM

## 2024-04-26 DIAGNOSIS — R001 Bradycardia, unspecified: Secondary | ICD-10-CM

## 2024-04-26 DIAGNOSIS — R5383 Other fatigue: Secondary | ICD-10-CM | POA: Diagnosis not present

## 2024-04-26 DIAGNOSIS — M0600 Rheumatoid arthritis without rheumatoid factor, unspecified site: Secondary | ICD-10-CM | POA: Diagnosis not present

## 2024-04-26 DIAGNOSIS — R768 Other specified abnormal immunological findings in serum: Secondary | ICD-10-CM | POA: Diagnosis not present

## 2024-04-26 DIAGNOSIS — L308 Other specified dermatitis: Secondary | ICD-10-CM | POA: Diagnosis not present

## 2024-04-26 DIAGNOSIS — H18453 Nodular corneal degeneration, bilateral: Secondary | ICD-10-CM

## 2024-04-26 DIAGNOSIS — M810 Age-related osteoporosis without current pathological fracture: Secondary | ICD-10-CM

## 2024-04-26 DIAGNOSIS — I272 Pulmonary hypertension, unspecified: Secondary | ICD-10-CM

## 2024-04-26 DIAGNOSIS — Z7952 Long term (current) use of systemic steroids: Secondary | ICD-10-CM | POA: Diagnosis not present

## 2024-04-26 DIAGNOSIS — D472 Monoclonal gammopathy: Secondary | ICD-10-CM

## 2024-04-26 DIAGNOSIS — C569 Malignant neoplasm of unspecified ovary: Secondary | ICD-10-CM

## 2024-04-26 DIAGNOSIS — D869 Sarcoidosis, unspecified: Secondary | ICD-10-CM

## 2024-04-26 DIAGNOSIS — R778 Other specified abnormalities of plasma proteins: Secondary | ICD-10-CM

## 2024-04-26 DIAGNOSIS — Z79899 Other long term (current) drug therapy: Secondary | ICD-10-CM | POA: Diagnosis not present

## 2024-04-26 DIAGNOSIS — G4709 Other insomnia: Secondary | ICD-10-CM

## 2024-04-26 DIAGNOSIS — G4733 Obstructive sleep apnea (adult) (pediatric): Secondary | ICD-10-CM

## 2024-04-26 MED ORDER — BD TB SYRINGE 27G X 1/2" 1 ML MISC: 1.0000 | 3 refills | Status: DC

## 2024-04-26 MED ORDER — PREDNISONE 5 MG PO TABS: ORAL_TABLET | ORAL | 0 refills | Status: DC

## 2024-04-26 MED ORDER — METHOTREXATE SODIUM CHEMO INJECTION 50 MG/2ML: 20.0000 mg | INTRAMUSCULAR | 0 refills | Status: AC

## 2024-04-26 NOTE — Progress Notes (Signed)
 Educated patient on how to use a vial and syringe and reviewed injection technique with patient.  Patient was able to demonstrate proper technique for injections using vial and syringe.  Provided patient educational material regarding injection technique and storage of methotrexate .     She is switching from oral to subcut MTX  Dose: MTX 0.8mL (20mg ) subcut every 7 days and folic acid  2mg  daily  Sherry Pennant, PharmD, MPH, BCPS, CPP Clinical Pharmacist (Rheumatology and Pulmonology)

## 2024-04-26 NOTE — Patient Instructions (Signed)
 Prednisone  5 mg tablet, 2 tablets by mouth every morning for 2 weeks Prednisone  1-1/2 tablet by mouth every morning for 2 weeks Prednisone  1 tablet by mouth every morning for 2 weeks Prednisone  half tablet by mouth every morning for 2 weeks  Discontinue methotrexate  tablet  Switch to methotrexate  injection 0.8 mL subcutaneously once a week on the day the methotrexate  tablet dose is due.  Standing Labs We placed an order today for your standing lab work.   Please have your standing labs drawn in 1 month and then every 3 months  Please have your labs drawn 2 weeks prior to your appointment so that the provider can discuss your lab results at your appointment, if possible.  Please note that you may see your imaging and lab results in MyChart before we have reviewed them. We will contact you once all results are reviewed. Please allow our office up to 72 hours to thoroughly review all of the results before contacting the office for clarification of your results.  WALK-IN LAB HOURS  Monday through Thursday from 8:00 am -12:30 pm and 1:00 pm-4:30 pm and Friday from 8:00 am-12:00 pm.  Patients with office visits requiring labs will be seen before walk-in labs.  You may encounter longer than normal wait times. Please allow additional time. Wait times may be shorter on  Monday and Thursday afternoons.  We do not book appointments for walk-in labs. We appreciate your patience and understanding with our staff.   Labs are drawn by [facility]. Please bring your co-pay at the time of your lab draw.  You may receive a bill from [facility] for your lab work.  Please note if you are on Hydroxychloroquine  and and an order has been placed for a Hydroxychloroquine  level,  you will need to have it drawn 4 hours or more after your last dose.  If you wish to have your labs drawn at another location, please call the office 24 hours in advance so we can fax the orders.  The office is located at [address]  [address], [place], [place] [address]   If you have any questions regarding directions or hours of operation,  please call [phone].   As a reminder, please drink plenty of water prior to coming for your lab work. Thanks!   Vaccines You are taking a medication(s) that can suppress your immune system.  The following immunizations are recommended: Flu annually Covid-19  Td/Tdap (tetanus, diphtheria, pertussis) every 10 years Pneumonia (Prevnar 15 then Pneumovax 23 at least 1 year apart.  Alternatively, can take Prevnar 20 without needing additional dose) Shingrix: 2 doses from 4 weeks to 6 months apart  Please check with your PCP to make sure you are up to date.   If you have signs or symptoms of an infection or start antibiotics: First, call your PCP for workup of your infection. Hold your medication through the infection, until you complete your antibiotics, and until symptoms resolve if you take the following: Injectable medication (Actemra, Benlysta, Cimzia, Cosentyx, Enbrel, Humira, Kevzara, Orencia, Remicade, Simponi, Stelara, Taltz, Tremfya) Methotrexate  Leflunomide (Arava) Mycophenolate (Cellcept) Xeljanz, Olumiant, or Rinvoq

## 2024-04-27 ENCOUNTER — Encounter: Payer: Self-pay | Admitting: Rheumatology

## 2024-04-30 ENCOUNTER — Other Ambulatory Visit: Payer: Self-pay | Admitting: Rheumatology

## 2024-04-30 DIAGNOSIS — M0600 Rheumatoid arthritis without rheumatoid factor, unspecified site: Secondary | ICD-10-CM

## 2024-04-30 NOTE — Patient Instructions (Addendum)
 Rocephin  one gram Im given in office. Take Levaquin  500 mg daily for 7 days. Follow up with Rheumatology next week. Check CBC.

## 2024-05-01 NOTE — Telephone Encounter (Signed)
 Last Fill: 02/02/2024  Eye exam: 09/22/2023 normal    Labs: 04/25/2024  WBC 12.1 RBC 3.56 Hemoglobin 10.0 HCT 31.9 MCHC 31.3 Neutro Abs 9741 Glucose 101 Total Protein 5.6 Albumin 3.3   Next Visit: 06/26/2024  Last Visit: 04/26/2024  DX: Seronegative rheumatoid arthritis   Current Dose per office note 04/26/2024: Plaquenil  200 mg p.o. twice daily Monday to Friday   Okay to refill Plaquenil ?

## 2024-05-02 ENCOUNTER — Telehealth: Payer: Self-pay

## 2024-05-02 NOTE — Telephone Encounter (Signed)
 Contacted patient to advise Patient may increase the dose of prednisone  5 mg tablet, 4 tablets p.o. every morning and then taper by 5 mg every 4 days to 1 week depending on when the symptoms improved.  Methotrexate  gradually will start helping her symptoms.  If methotrexate  is not sufficient we may have to start her on more aggressive treatment.  Patient should keep us  posted. Verbalized understanding.

## 2024-05-02 NOTE — Telephone Encounter (Signed)
 Patient called the office, stated that she has been sick since 04/08/2024 and was placed on a prednisone  taper dose. Patient states she felt better for about 3 days and now she is back running a fever of 101.7 and in lots of pain. Patient did her Methotrexate  injection on Monday 05/01/2024 but she is wondering if she may need a stronger dose of prednisone  to fully knock out whatever is keeping her with a fever and in so much pain. She says she can barely walk due to pain

## 2024-05-02 NOTE — Telephone Encounter (Signed)
 Patient may increase the dose of prednisone  5 mg tablet, 4 tablets p.o. every morning and then taper by 5 mg every 4 days to 1 week depending on when the symptoms improved.  Methotrexate  gradually will start helping her symptoms.  If methotrexate  is not sufficient we may have to start her on more aggressive treatment.  Patient should keep us  posted.

## 2024-05-09 NOTE — Telephone Encounter (Signed)
 Ok to increase prednisone  back to 20 mg daily for another week to allow the injectable methotrexate  more time to take effect.

## 2024-05-09 NOTE — Telephone Encounter (Signed)
 Contacted patient to advise that's its Ok to increase prednisone  back to 20 mg daily for another week to allow the injectable methotrexate  more time to take effect. Patient verbalized understanding and advised that her last fever was 101.7 . Patient states she is noticing the fever the day after she does her injections of methotrexate  but is not sure if that is the cause. Please advise.

## 2024-05-09 NOTE — Telephone Encounter (Signed)
 I would like your opinion--thank you!

## 2024-05-09 NOTE — Telephone Encounter (Signed)
 Patient called the office stating she is on day 4 of the 15 mg of the prednisone  an is back to feeling really achy and back running a fever. She has taken 3 doses of 6 methotrexate  and then 3 doses of 8 pills a day and then 2 injections of the methotrexate . Patient states she felt good on the 20mg  of prednisone  but once started tapering it all returned. Please advise.

## 2024-05-10 ENCOUNTER — Encounter: Payer: Self-pay | Admitting: *Deleted

## 2024-05-10 NOTE — Telephone Encounter (Signed)
 I spoke with patient.  She states that she continues to have swelling despite being on methotrexate .  She states that prednisone  helps temporarily.  I discussed that we may have to start her on Biologics.  She wants to proceed with that.  Please schedule next available appointment to discuss treatment options.

## 2024-05-10 NOTE — Telephone Encounter (Signed)
 Patient scheduled for 05/31/2024 to discuss treatment options. Patient placed on cancellation list if sooner appointment becomes available. Sent information on Humira and  Enbrel to patient.

## 2024-05-11 ENCOUNTER — Ambulatory Visit: Payer: PPO

## 2024-05-11 ENCOUNTER — Encounter: Payer: Self-pay | Admitting: Rheumatology

## 2024-05-11 DIAGNOSIS — I495 Sick sinus syndrome: Secondary | ICD-10-CM

## 2024-05-11 NOTE — Telephone Encounter (Signed)
 She should try to stay on the lowest comfortable dose. Hi Devki, Please apply for Enbrel or Humira.  Patient has failed Plaquenil  and subcutaneous methotrexate .  Thank you

## 2024-05-12 ENCOUNTER — Other Ambulatory Visit (HOSPITAL_COMMUNITY): Payer: Self-pay

## 2024-05-12 ENCOUNTER — Telehealth: Payer: Self-pay

## 2024-05-12 MED ORDER — PREDNISONE 5 MG PO TABS: 15.0000 mg | ORAL_TABLET | Freq: Every day | ORAL | 0 refills | Status: DC

## 2024-05-12 NOTE — Telephone Encounter (Signed)
 Received notification from [employer] regarding a prior authorization for ENBREL. Authorization has been APPROVED from 05/12/2024 to 11/08/2024. Approval letter sent to scan center.  Per test claim, copay for 28 days supply is $1361.48  Patient can fill through [facility]: [phone]   Authorization # [id number]  If patient interested and qualifies, can apply for Enbrel patient assistance through [employer]  Sherry Pennant, PharmD, MPH, BCPS, CPP Clinical Pharmacist (Rheumatology and Pulmonology)

## 2024-05-12 NOTE — Telephone Encounter (Signed)
 Patient will be Enbrel new start. Requires consent at upcoming visit  Submitted a Prior Authorization request to [employer] for ENBREL via CoverMyMeds. Will update once we receive a response.  Key: [facility]

## 2024-05-15 MED ORDER — PREDNISONE 5 MG PO TABS: 20.0000 mg | ORAL_TABLET | Freq: Every day | ORAL | 0 refills | Status: DC

## 2024-05-15 NOTE — Addendum Note (Signed)
 Addended by: CENA ALFONSO CROME on: 05/15/2024 08:56 AM   Modules accepted: Orders

## 2024-05-15 NOTE — Telephone Encounter (Signed)
Ok to send new prescription.

## 2024-05-17 LAB — CUP PACEART REMOTE DEVICE CHECK
Date Time Interrogation Session: 20250807092500
Implantable Lead Connection Status: 753985
Implantable Lead Connection Status: 753985
Implantable Lead Implant Date: 20220201
Implantable Lead Implant Date: 20220201
Implantable Lead Location: 753859
Implantable Lead Location: 753860
Implantable Pulse Generator Implant Date: 20220201
Pulse Gen Model: 2272
Pulse Gen Serial Number: 3896658

## 2024-05-17 NOTE — Progress Notes (Signed)
 Office Visit Note  Patient: Julia Solis             Date of Birth: October 21, 1950           MRN: [id number]             PCP: Perri Ronal PARAS, MD Referring: Perri Ronal PARAS, MD Visit Date: 05/31/2024 Occupation: @GUAROCC @  Subjective:  Joint pain   History of Present Illness: Julia Solis is a 73 y.o. female with seronegative rheumatoid arthritis, sarcoidosis, osteoarthritis and osteoporosis.  She returns today after her last visit in July 2025.  She has been on subcutaneous methotrexate  0.8 mL weekly since June 2025.  She has not noticed much improvement.  She states she is unable to taper prednisone  below 20 mg.  If she tapers prednisone  below 20 mg she starts having increased joint swelling and low-grade fevers.  She continues to have discomfort in her shoulders, elbows and her hands.  She also describes discomfort in her upper back and lower back.  She continues to be on Fosamax  70 mg weekly.    Activities of Daily Living:  Patient reports morning stiffness for 0 minutes.   Patient Reports nocturnal pain.  Difficulty dressing/grooming: Denies Difficulty climbing stairs: Reports Difficulty getting out of chair: Reports Difficulty using hands for taps, buttons, cutlery, and/or writing: Reports  Review of Systems  Constitutional:  Positive for fatigue and fever.  HENT:  Positive for sore throat. Negative for mouth sores and mouth dryness.   Eyes:  Positive for dryness.  Respiratory:  Positive for shortness of breath. Negative for cough and wheezing.   Cardiovascular:  Negative for chest pain and palpitations.  Gastrointestinal:  Negative for blood in stool, constipation and diarrhea.  Endocrine: Negative for increased urination.  Genitourinary:  Negative for involuntary urination.  Musculoskeletal:  Positive for joint pain, gait problem, joint pain, myalgias, muscle weakness, morning stiffness, muscle tenderness and myalgias. Negative for joint swelling.  Skin:  Negative  for color change, rash, hair loss and sensitivity to sunlight.  Allergic/Immunologic: Negative for susceptible to infections.  Neurological:  Positive for dizziness. Negative for headaches.  Hematological:  Negative for swollen glands.  Psychiatric/Behavioral:  Positive for sleep disturbance. Negative for depressed mood. The patient is not nervous/anxious.     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   Dry eye syndrome of both lacrimal glands 10/16/2015   Elevated LDL cholesterol level 02/26/2015   Back pain 02/20/2014   Insomnia 02/20/2014   Allergic rhinitis 02/20/2014   Osteopenia 07/07/2012   Fatigue 08/17/2011   Nonspecific (abnormal) findings on radiological and other examination of body structure 06/24/2010   ABNORMAL LUNG XRAY 06/24/2010   History of ovarian cancer 06/23/2010    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   Complication of anesthesia    GERD (gastroesophageal reflux disease)    Hearing aid worn    both ears   History of chemotherapy  2011   History of hiatal hernia    History of kidney stones    Hypertension    MGUS (monoclonal gammopathy of unknown significance)    dx by hematology per patient   Neuromuscular disorder (HCC)    left foot numbness since  eback surgery   Osteoporosis    PONV (postoperative nausea and vomiting)    Presence of permanent  cardiac pacemaker    Sarcoidosis    in remission sees pumonary  once a year dr bellinger lov 08-21-2020 [facility]   Sick sinus syndrome (HCC)    Wears glasses     Family History  Problem Relation Age of Onset   Hypertension Mother    Prostate cancer Father 74   Cancer Father    Hypertension Father    Stroke Father    Colon polyps Sister    Colon cancer Maternal Grandmother        dx in her 37s   Heart disease Maternal Grandfather    Colon cancer Paternal Grandmother    Heart disease Paternal Grandfather    Breast cancer Paternal Aunt 31   Colon cancer Paternal Aunt        dx in her 36s   Melanoma Cousin 46   Breast cancer Cousin 72       multiple cousins   Colon polyps Cousin    Breast cancer Cousin 59   Breast cancer Cousin        father's maternal 1st cousin   Breast cancer Cousin        Father's first cousin's daughter   Cancer Other        maternal great uncle with Cancer - NOS   Cancer Other        maternal great grandmother with either colon or stomach cancer   Breast cancer Other        fathers maternal aunt   Past Surgical History:  Procedure Laterality Date   ABDOMINAL HYSTERECTOMY  2011   APPENDECTOMY  2011    ?with hysterectomy   BACK SURGERY  1997   lumbar   CARPAL TUNNEL RELEASE Right    CATARACT EXTRACTION Right 08/10/2023   CATARACT EXTRACTION Left 08/24/2023   CESAREAN SECTION  1982, 1985   COSMETIC SURGERY  2007   brow lift   CYSTOSCOPY WITH RETROGRADE PYELOGRAM, URETEROSCOPY AND STENT PLACEMENT Left 02/04/2021   Procedure: CYSTOSCOPY WITH RETROGRADE PYELOGRAM, URETEROSCOPY AND STENT PLACEMENT;  Surgeon: Rosalind Zachary NOVAK, MD;  Location: [facility];  Service: Urology;  Laterality: Left;  1 HR; nephrolithiasis   ECTOPIC PREGNANCY SURGERY  1980   EXTRACORPOREAL SHOCK WAVE LITHOTRIPSY Left 06/23/2018   Procedure: LEFT EXTRACORPOREAL SHOCK WAVE LITHOTRIPSY (ESWL);  Surgeon: Sherrilee Belvie CROME, MD;  Location: [facility];  Service:  Urology;  Laterality: Left;   GANGLION CYST EXCISION  1975   HOLMIUM LASER APPLICATION Left 02/04/2021   Procedure: HOLMIUM LASER APPLICATION;  Surgeon: Rosalind Zachary NOVAK, MD;  Location: [facility];  Service: Urology;  Laterality: Left;   PACEMAKER IMPLANT N/A 11/13/2020   Procedure: PACEMAKER IMPLANT;  Surgeon: Kelsie Agent, MD;  Location: [facility];  Service: Cardiovascular;  Laterality: N/A;   TONSILLECTOMY  1957   adenoids removed   Social History   Social History Narrative   Not on file   Immunization History  Administered Date(s) Administered   Fluad Quad(high Dose 65+) 06/22/2019, 06/15/2022   Fluad Trivalent(High Dose 65+) 07/06/2023   INFLUENZA, HIGH DOSE SEASONAL PF 06/22/2019,  07/01/2020, 06/17/2021, 12/16/2021   Influenza Nasal 07/06/2023   Influenza Split 07/07/2011, 07/07/2012, 07/04/2013   Influenza,inj,Quad PF,6+ Mos 07/09/2018   Influenza-Unspecified 07/05/2014, 06/26/2015, 07/05/2016, 07/29/2017   Moderna Covid-19 Fall Seasonal Vaccine 60yrs & older 01/24/2024   Moderna Sars-Covid-2 Vaccination 11/07/2019, 12/07/2019, 07/29/2020, 07/06/2023   PFIZER Comirnaty(Gray Top)Covid-19 Tri-Sucrose Vaccine 01/11/2021   Pfizer Covid-19 Vaccine Bivalent Booster 48yrs & up 06/17/2021, 01/27/2022   Pneumococcal Conjugate-13 02/26/2015, 04/19/2017   Pneumococcal Polysaccharide-23 10/05/2009, 07/11/2018, 12/16/2021   Respiratory Syncytial Virus Vaccine,Recomb Aduvanted(Arexvy) 07/16/2022   Tdap 06/05/2009, 03/19/2020   Unspecified SARS-COV-2 Vaccination 06/23/2022   Zoster Recombinant(Shingrix) 07/20/2023   Zoster, Live 10/05/2009     Objective: Vital Signs: BP (!) 158/78 (BP Location: Left Arm, Patient Position: Sitting, Cuff Size: Normal)   Pulse 62   Resp 15   Ht 5' 4.5 (1.638 m)   Wt 177 lb 3.2 oz (80.4 kg)   BMI 29.95 kg/m    Physical Exam Vitals and nursing note reviewed.  Constitutional:      Appearance: She is well-developed.   HENT:     Head: Normocephalic and atraumatic.  Eyes:     Conjunctiva/sclera: Conjunctivae normal.  Cardiovascular:     Rate and Rhythm: Normal rate and regular rhythm.     Heart sounds: Normal heart sounds.  Pulmonary:     Effort: Pulmonary effort is normal.     Breath sounds: Normal breath sounds.  Abdominal:     General: Bowel sounds are normal.     Palpations: Abdomen is soft.  Musculoskeletal:     Cervical back: Normal range of motion.  Lymphadenopathy:     Cervical: No cervical adenopathy.  Skin:    General: Skin is warm and dry.     Capillary Refill: Capillary refill takes less than 2 seconds.  Neurological:     Mental Status: She is alert and oriented to person, place, and time.  Psychiatric:        Behavior: Behavior normal.      Musculoskeletal Exam: Cervical, thoracic and lumbar spine with good range of motion.  Shoulders, elbows, wrist joints, MCPs PIPs and DIPs with good range of motion with no synovitis.  Hip joints and knee joints in good range of motion without any warmth swelling or effusion.  There was no tenderness over ankles or MTPs.  CDAI Exam: CDAI Score: -- Patient Global: --; Provider Global: -- Swollen: --; Tender: -- Joint Exam 05/31/2024   No joint exam has been documented for this visit   There is currently no information documented on the homunculus. Go to the Rheumatology activity and complete the homunculus joint exam.  Investigation: No additional findings.  Imaging: CUP PACEART REMOTE DEVICE CHECK Result Date: 05/17/2024 PPM Scheduled remote reviewed. Normal device function.  Presenting rhythm: AP/VS. Next remote 91 days. [facility], [facility]   Recent Labs: Lab Results  Component Value Date   WBC 14.2 (H) 05/18/2024   HGB 10.8 (L) 05/18/2024   PLT 290 05/18/2024   NA 138 05/25/2024   K 4.6 05/25/2024   CL 103 05/25/2024   CO2 28 05/25/2024   GLUCOSE 101 (H) 05/25/2024   BUN 26 (H) 05/25/2024   CREATININE 0.77 05/25/2024   BILITOT 0.4  05/25/2024   ALKPHOS 68 01/20/2024   AST 15 05/25/2024   ALT 12 05/25/2024   PROT 5.9 (L) 05/25/2024   ALBUMIN 3.7 01/20/2024   CALCIUM  8.8 05/25/2024   GFRAA 93 10/28/2020   QFTBGOLDPLUS NEGATIVE 07/06/2023    Speciality Comments: PLQ EYE  EXAM 09/22/2023 normal [facility] f/u 12 months.  Procedures:  No procedures performed Allergies: Augmentin  [amoxicillin -pot clavulanate], Flomax [tamsulosin hcl], Latex, Tramadol , and Ciprofloxacin   Assessment / Plan:     Visit Diagnoses: Seronegative rheumatoid arthritis (HCC) - Ultrasound performed yesterday on 02/24/2024--positive for synovitis and tenosynovitis involving both hands.  Patient was started on prednisone  taper at the last visit.  She states she could not taper prednisone  and stayed on prednisone  20 mg p.o. daily as she experiences increased swelling.  She has not noticed much improvement on methotrexate  0.8 mL subcu weekly along with folic acid .  She switched from oral to subcu methotrexate  2 months ago.  We applied for Enbrel  due to ongoing pain and inflammation in her joints.  Enbrel  has been approved.  Patient wants to start on Enbrel  today.  Side effects were discussed at length.  She tolerated and Ro without any side effects. Medication counseling:   TB Test: July 06, 2023 Hepatitis panel: July 06, 2023  Chest x-ray: September 27, 2023  Does patient have diagnosis of heart failure?  No  Counseled patient that Enbrel  is a TNF blocking agent.  Reviewed Enbrel  dose of 50 mg once weekly.  Counseled patient on purpose, proper use, and adverse effects of Enbrel .  Reviewed the most common adverse effects including infections, headache, and injection site reactions. Discussed that there is the possibility of an increased risk of malignancy but it is not well understood if this increased risk is due to the medication or the disease state.  Advised patient to get yearly dermatology exams due to risk of skin cancer.   Reviewed the importance of regular labs while on Enbrel  therapy.  Advised patient to get standing labs one month after starting Enbrel  then every 2 months.  Provided patient with standing lab orders.  Counseled patient that Enbrel  should be held prior to scheduled surgery.  Counseled patient to avoid live vaccines while on Enbrel .  Advised patient to get annual influenza vaccine and the pneumococcal vaccine as needed.  Provided patient with medication education material and answered all questions.  Patient voiced understanding.  Patient consented to Enbrel .  Will upload consent into the media tab.  Reviewed storage instructions for Enbrel .  Advised initial injection must be administered in office.  Patient voiced understanding.     High risk medication use - Plaquenil  200 mg p.o. twice daily Monday to Friday, methotrexate  0.8 mL subcu  weekly, folic acid  2 mg p.o. daily.PLQ EYE EXAM 09/22/2023.  Patient was advised to discontinue Plaquenil .  She will continue methotrexate  and folic acid .  Information regarding immunization was placed in the AVS.  She was advised to hold Enbrel  if she develops an infection resume after the infection resolves.  Annual skin examination to screen for skin cancer was advised.  Use of sunscreen and sun protection was discussed.  Positive ANA (antinuclear antibody) - low titer positive ANA, ENA panel and complements are normal.  No clinical features of systemic lupus.  Current chronic use of systemic steroids - Recurrent use of prednisone  since February 2024.  She has been off and on prednisone --most recent taper was discontinued on 01/13/2024.  She restarted prednisone  after the last visit and has been on prednisone  20 mg p.o. daily continuously.  She is unable to taper prednisone .  We had a detailed discussion regarding risk of adrenal insufficiency with high-dose prednisone .  I advised her to taper prednisone  to 15 mg p.o. daily after starting Enbrel  and then taper by 5  mg every 2  weeks.  Tapering schedule was placed in the AVS.  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 chest x-ray December 2024 showed no active disease  Dry eye syndrome of both eyes-the counter products were discussed.  Age-related osteoporosis without current pathological fracture - DEXA 01/08/2024:The BMD measured at [facility] Sp[facility] L1-L4 is 0.834 g/cm2 with a T-score of -2.9.  Fosamax  70 mg p.o. weekly since April 2025 per patient.  Other medical problems are listed as follows:  MGUS (monoclonal gammopathy of unknown significance)  Other fatigue  Other eczema  Malignant neoplasm of ovary, unspecified laterality (HCC)  Pacemaker  Symptomatic sinus bradycardia  Obstructive sleep apnea  Pulmonary hypertension (HCC)  Salzmann's nodular dystrophy of both eyes  Abnormal SPEP  Other insomnia  Orders: Orders Placed This Encounter  Procedures   QuantiFERON-TB Gold Plus   Meds ordered this encounter  Medications   etanercept  (ENBREL  MINI) 50 MG/ML injection    Sig: Inject 1 mL (50 mg total) into the skin once a week.    Dispense:  12 mL    Refill:  0    **pt aware of copay**    Follow-Up Instructions: Return in about 2 months (around 07/31/2024) for Rheumatoid arthritis, Sarcoidosis.   Maya Nash, MD  Note - This record has been created using Animal nutritionist.  Chart creation errors have been sought, but may not always  have been located. Such creation errors do not reflect on  the standard of medical care.

## 2024-05-18 ENCOUNTER — Other Ambulatory Visit

## 2024-05-18 DIAGNOSIS — D649 Anemia, unspecified: Secondary | ICD-10-CM | POA: Diagnosis not present

## 2024-05-18 LAB — CBC WITH DIFFERENTIAL/PLATELET
Absolute Lymphocytes: 1264 {cells}/uL (ref 850–3900)
Absolute Monocytes: 639 {cells}/uL (ref 200–950)
Basophils Absolute: 28 {cells}/uL (ref 0–200)
Basophils Relative: 0.2 %
Eosinophils Absolute: 256 {cells}/uL (ref 15–500)
Eosinophils Relative: 1.8 %
HCT: 34.7 % — ABNORMAL LOW (ref 35.0–45.0)
Hemoglobin: 10.8 g/dL — ABNORMAL LOW (ref 11.7–15.5)
MCH: 28.3 pg (ref 27.0–33.0)
MCHC: 31.1 g/dL — ABNORMAL LOW (ref 32.0–36.0)
MCV: 90.8 fL (ref 80.0–100.0)
MPV: 9.8 fL (ref 7.5–12.5)
Monocytes Relative: 4.5 %
Neutro Abs: 12013 {cells}/uL — ABNORMAL HIGH (ref 1500–7800)
Neutrophils Relative %: 84.6 %
Platelets: 290 Thousand/uL (ref 140–400)
RBC: 3.82 Million/uL (ref 3.80–5.10)
RDW: 15 % (ref 11.0–15.0)
Total Lymphocyte: 8.9 %
WBC: 14.2 Thousand/uL — ABNORMAL HIGH (ref 3.8–10.8)

## 2024-05-18 NOTE — Addendum Note (Signed)
 Addended by: Kristan Votta P on: 05/18/2024 11:29 AM   Modules accepted: Orders

## 2024-05-21 ENCOUNTER — Ambulatory Visit: Payer: Self-pay | Admitting: Cardiovascular Disease

## 2024-05-21 ENCOUNTER — Other Ambulatory Visit: Payer: Self-pay | Admitting: Rheumatology

## 2024-05-22 NOTE — Telephone Encounter (Signed)
 ATC patient regarding Enbrel PAP application if interested. MyChart message sent to patient  Sherry Pennant, PharmD, MPH, BCPS, CPP Clinical Pharmacist (Rheumatology and Pulmonology)

## 2024-05-23 NOTE — Telephone Encounter (Signed)
 Patient returned call - she would like to start at [facility] on 05/31/2024 and further discuss prednisone  taper  Mosetta Ferdinand, PharmD, MPH, BCPS, CPP Clinical Pharmacist (Rheumatology and Pulmonology)

## 2024-05-25 ENCOUNTER — Encounter: Payer: Self-pay | Admitting: Rheumatology

## 2024-05-25 ENCOUNTER — Other Ambulatory Visit: Payer: Self-pay

## 2024-05-25 DIAGNOSIS — Z79899 Other long term (current) drug therapy: Secondary | ICD-10-CM | POA: Diagnosis not present

## 2024-05-25 NOTE — Telephone Encounter (Signed)
 She will need CMP.

## 2024-05-26 LAB — COMPREHENSIVE METABOLIC PANEL WITH GFR
AG Ratio: 1.6 (calc) (ref 1.0–2.5)
ALT: 12 U/L (ref 6–29)
AST: 15 U/L (ref 10–35)
Albumin: 3.6 g/dL (ref 3.6–5.1)
Alkaline phosphatase (APISO): 55 U/L (ref 37–153)
BUN/Creatinine Ratio: 34 (calc) — ABNORMAL HIGH (ref 6–22)
BUN: 26 mg/dL — ABNORMAL HIGH (ref 7–25)
CO2: 28 mmol/L (ref 20–32)
Calcium: 8.8 mg/dL (ref 8.6–10.4)
Chloride: 103 mmol/L (ref 98–110)
Creat: 0.77 mg/dL (ref 0.60–1.00)
Globulin: 2.3 g/dL (ref 1.9–3.7)
Glucose, Bld: 101 mg/dL — ABNORMAL HIGH (ref 65–99)
Potassium: 4.6 mmol/L (ref 3.5–5.3)
Sodium: 138 mmol/L (ref 135–146)
Total Bilirubin: 0.4 mg/dL (ref 0.2–1.2)
Total Protein: 5.9 g/dL — ABNORMAL LOW (ref 6.1–8.1)
eGFR: 82 mL/min/1.73m2 (ref >=60)

## 2024-05-29 ENCOUNTER — Ambulatory Visit: Payer: Self-pay | Admitting: Physician Assistant

## 2024-05-29 NOTE — Progress Notes (Signed)
CMP stable

## 2024-05-31 ENCOUNTER — Other Ambulatory Visit (HOSPITAL_COMMUNITY): Payer: Self-pay

## 2024-05-31 ENCOUNTER — Ambulatory Visit: Attending: Rheumatology | Admitting: Rheumatology

## 2024-05-31 ENCOUNTER — Other Ambulatory Visit: Payer: Self-pay

## 2024-05-31 ENCOUNTER — Encounter: Payer: Self-pay | Admitting: Rheumatology

## 2024-05-31 VITALS — BP 158/78 | HR 62 | Resp 15 | Ht 64.5 in | Wt 177.2 lb

## 2024-05-31 DIAGNOSIS — M0600 Rheumatoid arthritis without rheumatoid factor, unspecified site: Secondary | ICD-10-CM

## 2024-05-31 DIAGNOSIS — C569 Malignant neoplasm of unspecified ovary: Secondary | ICD-10-CM | POA: Diagnosis not present

## 2024-05-31 DIAGNOSIS — R778 Other specified abnormalities of plasma proteins: Secondary | ICD-10-CM

## 2024-05-31 DIAGNOSIS — Z79899 Other long term (current) drug therapy: Secondary | ICD-10-CM

## 2024-05-31 DIAGNOSIS — G4709 Other insomnia: Secondary | ICD-10-CM

## 2024-05-31 DIAGNOSIS — R5383 Other fatigue: Secondary | ICD-10-CM | POA: Diagnosis not present

## 2024-05-31 DIAGNOSIS — D472 Monoclonal gammopathy: Secondary | ICD-10-CM

## 2024-05-31 DIAGNOSIS — H18453 Nodular corneal degeneration, bilateral: Secondary | ICD-10-CM

## 2024-05-31 DIAGNOSIS — D869 Sarcoidosis, unspecified: Secondary | ICD-10-CM | POA: Diagnosis not present

## 2024-05-31 DIAGNOSIS — H04123 Dry eye syndrome of bilateral lacrimal glands: Secondary | ICD-10-CM

## 2024-05-31 DIAGNOSIS — Z7952 Long term (current) use of systemic steroids: Secondary | ICD-10-CM

## 2024-05-31 DIAGNOSIS — I272 Pulmonary hypertension, unspecified: Secondary | ICD-10-CM

## 2024-05-31 DIAGNOSIS — M810 Age-related osteoporosis without current pathological fracture: Secondary | ICD-10-CM | POA: Diagnosis not present

## 2024-05-31 DIAGNOSIS — L308 Other specified dermatitis: Secondary | ICD-10-CM | POA: Diagnosis not present

## 2024-05-31 DIAGNOSIS — Z111 Encounter for screening for respiratory tuberculosis: Secondary | ICD-10-CM

## 2024-05-31 DIAGNOSIS — R768 Other specified abnormal immunological findings in serum: Secondary | ICD-10-CM

## 2024-05-31 DIAGNOSIS — Z7189 Other specified counseling: Secondary | ICD-10-CM

## 2024-05-31 DIAGNOSIS — G4733 Obstructive sleep apnea (adult) (pediatric): Secondary | ICD-10-CM

## 2024-05-31 DIAGNOSIS — Z950 Presence of cardiac pacemaker: Secondary | ICD-10-CM | POA: Diagnosis not present

## 2024-05-31 DIAGNOSIS — R001 Bradycardia, unspecified: Secondary | ICD-10-CM

## 2024-05-31 MED ORDER — ENBREL MINI 50 MG/ML ~~LOC~~ SOCT
50.0000 mg | SUBCUTANEOUS | 0 refills | Status: DC
  Filled 2024-05-31: qty 12, 84d supply, fill #0

## 2024-05-31 NOTE — Progress Notes (Signed)
 Pharmacy Note  Subjective:   Patient presents to clinic today to receive first dose of ENBREL  for rheumatoid arthritis. Patient currently takes methotrexate  20mg  subcut once weekly with folic acid  2mg  daily and prednisone  20mg  daily  Denies history of CHF, personal or family history of SLE or MS.  Has past history of sarcoidosis that has been in remission for some time  Patient running a fever or have signs/symptoms of infection? No  Patient currently on antibiotics for the treatment of infection? No  Patient have any upcoming invasive procedures/surgeries? No  Objective: CMP     Component Value Date/Time   NA 138 05/25/2024 1506   NA 144 10/28/2020 1636   K 4.6 05/25/2024 1506   CL 103 05/25/2024 1506   CO2 28 05/25/2024 1506   GLUCOSE 101 (H) 05/25/2024 1506   BUN 26 (H) 05/25/2024 1506   BUN 16 10/28/2020 1636   CREATININE 0.77 05/25/2024 1506   CALCIUM  8.8 05/25/2024 1506   PROT 5.9 (L) 05/25/2024 1506   ALBUMIN 3.7 01/20/2024 1310   AST 15 05/25/2024 1506   ALT 12 05/25/2024 1506   ALKPHOS 68 01/20/2024 1310   BILITOT 0.4 05/25/2024 1506   GFRNONAA >60 01/20/2024 1310   GFRNONAA 82 07/11/2020 0949   GFRAA 93 10/28/2020 1636   GFRAA 95 07/11/2020 0949    CBC    Component Value Date/Time   WBC 14.2 (H) 05/18/2024 1129   RBC 3.82 05/18/2024 1129   HGB 10.8 (L) 05/18/2024 1129   HGB 13.7 10/28/2020 1636   HGB 13.0 10/02/2011 1026   HCT 34.7 (L) 05/18/2024 1129   HCT 41.7 10/28/2020 1636   HCT 37.8 10/02/2011 1026   PLT 290 05/18/2024 1129   PLT 251 10/28/2020 1636   MCV 90.8 05/18/2024 1129   MCV 87 10/28/2020 1636   MCV 89.0 10/02/2011 1026   MCH 28.3 05/18/2024 1129   MCHC 31.1 (L) 05/18/2024 1129   RDW 15.0 05/18/2024 1129   RDW 13.1 10/28/2020 1636   RDW 13.6 10/02/2011 1026   LYMPHSABS 1.6 01/20/2024 1310   LYMPHSABS 3.2 (H) 10/28/2020 1636   LYMPHSABS 1.8 10/02/2011 1026   MONOABS 0.6 01/20/2024 1310   MONOABS 0.4 10/02/2011 1026   EOSABS 256  05/18/2024 1129   EOSABS 0.3 10/28/2020 1636   BASOSABS 28 05/18/2024 1129   BASOSABS 0.1 10/28/2020 1636   BASOSABS 0.0 10/02/2011 1026    Baseline Immunosuppressant Therapy Labs TB GOLD    Latest Ref Rng & Units 07/06/2023   10:33 AM  Quantiferon TB Gold  Quantiferon TB Gold Plus NEGATIVE NEGATIVE    Hepatitis Panel    Latest Ref Rng & Units 07/06/2023   10:33 AM  Hepatitis  Hep B Surface Ag NON-REACTIVE NON-REACTIVE   Hep B IgM NON-REACTIVE NON-REACTIVE   Hep C Ab NON-REACTIVE NON-REACTIVE    HIV No results found for: HIV Immunoglobulins    Latest Ref Rng & Units 01/20/2024    1:10 PM  Immunoglobulin Electrophoresis  IgG 586 - 1,602 mg/dL 111   IgM 26 - 782 mg/dL 62    SPEP    Latest Ref Rng & Units 05/25/2024    3:06 PM  Serum Protein Electrophoresis  Total Protein 6.1 - 8.1 g/dL 5.9    Chest x-ray: 1/70/7975 - Stable chronic findings seen involving upper lobes. No acute abnormality is noted.  Assessment/Plan:  Counseled patient that Enbrel  is a TNF blocking agent.  Counseled patient on purpose, proper use, and adverse effects of Enbrel .  Reviewed the most common adverse effects including infections, headache, and injection site reactions.  Discussed that there is the possibility of an increased risk of malignancy including non-melanoma skin cancer but it is not well understood if this increased risk is due to the medication or the disease state.  Advised patient to get yearly dermatology exams due to risk of skin cancer.  Counseled patient that Enbrel  should be held prior to scheduled surgery.  Counseled patient to avoid live vaccines while on Enbrel .  Recommend annual influenza, PCV 15 or PCV20 or Pneumovax 23, and Shingrix as indicated.  Reviewed the importance of regular labs while on Enbrel  therapy.  Will monitor CBC and CMP 1 month after starting and then every 3 months routinely thereafter. Will monitor TB gold annually. Standing orders placed. Provided patient  with medication education material and answered all questions.  Patient consented to Enbrel .  Will upload consent into the media tab.  Reviewed storage instructions for Enbrel .    Reviewed importance of holding ENBREL  and METHOTREXATEwith signs/symptoms of an infections, if antibiotics are prescribed to treat an active infection, and with invasive procedures  Demonstrated proper injection technique with Enbrel  Mini and Autotouch demo device  Patient able to demonstrate proper injection technique using the teach back method.  Patient self injected in the left lower abdomen with:  Sample Medication: Enbrel  Autotuch Injector Device (patient to keep) NDC: [address]-98 Lot: [id number] Expiration: 07/04/2026  Sample Medication: Enbrel  Mini Cartridge 50mg /mL NDC: [address]-03 Lot: [id number] Expiration: 05/04/2026  Patient tolerated well.  Observed for 30 mins in office for adverse reaction. Patient denies itchiness and irritation at injection., No swelling or redness noted., and Reviewed injection site reaction management with patient verbally and printed information for review in AVS  Patient is to return in 1 month for labs and 6-8 weeks for follow-up appointment.  Standing orders for CBC/CMP placed.  TB gold will be monitored yearly. Lipid panel will be monitored every 12 months.  Patient sees dermatology placed today for yearly skin checks while on TNF inhibitor due to risk for non melanoma skin cancer  ENBREL  approved through insurance .   Rx sent to: [facility]: [phone] .  Patient provided with pharmacy phone number and advised that [facility] will call to schedule shipment to home. She is aware of $2000 OOP max through Medicare Part D and pay towards it  Patient will continue ENBREL  50mg  subcut every 7 days. CONTINUE methotrexate  20mg  (0.33mL) subcut every 7 days with folic acid  2mg  daily. DISCONTINUE hydroxychloroquine . TAPER prednisone  as follows: Two weeks after  starting Enbrel , reduce the dose of prednisone  to 15 mg by mouth every morning  Prednisone  15 mg by mouth daily for 2 weeks Prednisone  10 mg by mouth daily for 2 weeks Prednisone  5 mg by mouth daily for 2 weeks Prednisone  2.5 mg by mouth daily for 2 weeks and then discontinue  All questions encouraged and answered.  Instructed patient to call with any further questions or concerns.  Sherry Pennant, PharmD, MPH, BCPS, CPP Clinical Pharmacist (Rheumatology and Pulmonology)  05/31/2024 10:51 AM

## 2024-05-31 NOTE — Progress Notes (Signed)
 Specialty Pharmacy Initial Fill Coordination Note  Arturo Freundlich is a 73 y.o. female contacted today regarding initial fill of specialty medication(s) Etanercept  (Enbrel  Mini)   Patient requested Delivery   Delivery date: 06/02/24   Verified address: [address]  [place] [address]   Medication will be filled on 06/01/24.   Patient is aware of $1,354.43 copayment. CC info collected and forwarded to Tamra at [facility].

## 2024-05-31 NOTE — Patient Instructions (Addendum)
 Your next ENBREL  dose is due on 06/07/24, 06/14/24, and every 7 days thereafter  DISCONTINUE hydroxychloroquine  CONTINUE methotrexate  20mg  (0.83mL) subcut once weekly with folic acid  2mg  daily TAPER prednisone  as below: Two weeks after starting Enbrel , reduce the dose of prednisone  to 15 mg by mouth every morning  Prednisone  15 mg by mouth daily for 2 weeks Prednisone  10 mg by mouth daily for 2 weeks Prednisone  5 mg by mouth daily for 2 weeks Prednisone  2.5 mg by mouth daily for 2 weeks and then discontinue  HOLD ENBREL  and METHOTREXATE  if you have signs or symptoms of an infection. You can resume once you feel better or back to your baseline. HOLD ENBREL  and METHOTREXATE  if you start antibiotics to treat an infection. HOLD ENBREL  around the time of surgery/procedures. Your surgeon will be able to provide recommendations on when to hold BEFORE and when you are cleared to RESUME.  Pharmacy information: Your prescription will be shipped from [facility]. Their phone number is [phone] Julia Solis will call to schedule shipment and confirm address. They will mail your medication to your home.  Labs are due in 1 month then every 3 months. Lab hours are from Monday to Thursday 8am-12:30pm and 1pm-4pm and Friday 8am-12pm. You do not need an appointment if you come for labs during these times. If you'd like to go to a [facility] or [facility] closer to home, please call our clinic 48 hours prior to lab date so we can release orders in a timely manner.  Stay up to date on all routine vaccines: influenza, pneumonia, COVID19, Shingles. Make sure you are on prednisone  10mg  or less to receive any vaccines  How to manage an injection site reaction: Remember the 5 C's: COUNTER - leave on the counter at least 30 minutes but up to overnight to bring medication to room temperature. This may help prevent stinging COLD - place something cold (like an ice gel pack or cold water bottle) on the  injection site just before cleansing with alcohol. This may help reduce pain CLARITIN - use Claritin (generic name is loratadine) for the first two weeks of treatment or the day of, the day before, and the day after injecting. This will help to minimize injection site reactions CORTISONE CREAM - apply if injection site is irritated and itching CALL ME - if injection site reaction is bigger than the size of your fist, looks infected, blisters, or if you develop hives  Please get annual skin examination to screen for skin cancer while you are on Enbrel .  Please use sunscreen and sun protection.  Etanercept  Injection What is this medication? ETANERCEPT  (et a [employer]) treats autoimmune conditions, such as psoriasis and certain types of arthritis. It works by slowing down an overactive immune system. It belongs to a group of medications called TNF inhibitors. This medicine may be used for other purposes; ask your health care provider or pharmacist if you have questions. COMMON BRAND NAME(S): Enbrel  What should I tell my care team before I take this medication? They need to know if you have any of these conditions: Bleeding disorder Cancer Diabetes Granulomatosis with polyangiitis Heart failure HIV or AIDS Immune system problems Infection, such as tuberculosis (TB) or other bacterial, fungal or viral infections Liver disease Nervous system problems, such as Guillain-Barre syndrome, multiple sclerosis or seizures Recent or upcoming vaccine An unusual or allergic reaction to etanercept , other medications, food, dyes, or preservatives Pregnant or trying to get pregnant Breastfeeding How should I use  this medication? The medication is injected under the skin. You will be taught how to prepare and give it. Take it as directed on the prescription label. Keep taking it unless your care team tells you stop. This medication comes with INSTRUCTIONS FOR USE. Ask your pharmacist for directions on how to  use this medication. Read the information carefully. Talk to your pharmacist or care team if you have questions. If you use a pen, be sure to take off the outer needle cover before using the dose. It is important that you put your used needles and syringes in a special sharps container. Do not put them in a trash can. If you do not have a sharps container, call your pharmacist or care team to get one. A special MedGuide will be given to you by the pharmacist with each prescription and refill. Be sure to read this information carefully each time. Talk to your care team about the use of this medication in children. While it may be prescribed for children as young as 78 years of age for selected conditions, precautions do apply. Overdosage: If you think you have taken too much of this medicine contact a poison control center or emergency room at once. NOTE: This medicine is only for you. Do not share this medicine with others. What if I miss a dose? If you miss a dose, take it as soon as you can. If it is almost time for your next dose, take only that dose. Do not take double or extra doses. What may interact with this medication? Do not take this medication with any of the following: Biologic medications, such as adalimumab, certolizumab, golimumab, infliximab Live vaccines Rilonacept This medication may also interact with the following: Abatacept [place] Biologic medications, such as anifrolumab, baricitinib, belimumab, canakinumab, natalizumab, rituximab, sarilumab, tocilizumab, tofacitinib, upadacitinib, vedolizumab Cyclophosphamide Sulfasalazine This list may not describe all possible interactions. Give your health care provider a list of all the medicines, herbs, non-prescription drugs, or dietary supplements you use. Also tell them if you smoke, drink alcohol, or use illegal drugs. Some items may interact with your medicine. What should I watch for while using this medication? Visit your  care team for regular checks on your progress. Tell your care team if your symptoms do not start to get better or if they get worse. This medication may increase your risk of getting an infection. Call your care team for advice if you get a fever, chills, sore throat, or other symptoms of a cold or flu. Do not treat yourself. Try to avoid being around people who are sick. If you have not had the measles or chickenpox vaccines, tell your care team right away if you are around someone with these viruses. You will be tested for tuberculosis (TB) before you start this medication. If your care team prescribes any medication for TB, you should start taking the TB medication before starting this medication. Make sure to finish the full course of TB medication. Avoid taking medications that contain aspirin, acetaminophen , ibuprofen, naproxen, or ketoprofen unless instructed by your care team. These medications may hide fever. Talk to your care team about your risk of cancer. You may be more at risk for certain types of cancer if you take this medication. This medication can decrease the response to a vaccine. If you need to get vaccinated, tell your care team if you have received this medication. Extra booster doses may be needed. Talk to your care team to see if a different  vaccination schedule is needed. What side effects may I notice from receiving this medication? Side effects that you should report to your care team as soon as possible: Allergic reactions--skin rash, itching, hives, swelling of the face, lips, tongue, or throat Body pain, tingling, or numbness Eye pain, change in vision, vision loss Heart failure--shortness of breath, swelling of the ankles, feet, or hands, sudden weight gain, unusual weakness or fatigue Infection--fever, chills, cough, sore throat, wounds that don't heal, pain or trouble when passing urine, general feeling of discomfort or being unwell Liver injury--right upper belly  pain, loss of appetite, nausea, light-colored stool, dark yellow or brown urine, yellowing skin or eyes, unusual weakness or fatigue Low red blood cell level--unusual weakness or fatigue, dizziness, headache, trouble breathing Lupus-like syndrome--joint pain, swelling, or stiffness, butterfly-shaped rash on the face, rashes that get worse in the sun, fever, unusual weakness or fatigue New or worsening psoriasis--rash with itchy, scaly patches Seizures Unusual bruising or bleeding Weakness in arms and legs Side effects that usually do not require medical attention (report to your care team if they continue or are bothersome): Headache Pain, redness, or irritation at injection site Sinus pain or pressure around the face or forehead This list may not describe all possible side effects. Call your doctor for medical advice about side effects. You may report side effects to FDA at 1-800-FDA-1088. Where should I keep my medication? Keep out of the reach of children and pets. See product for storage information. Each product may have different instructions. Get rid of any unused medication after the expiration date. To get rid of medications that are no longer needed or have expired: Take the medication to a medication take-back program. Check with your pharmacy or law enforcement to find a location. If you cannot return the medication, ask your pharmacist or care team how to get rid of this medication safely. NOTE: This sheet is a summary. It may not cover all possible information. If you have questions about this medicine, talk to your doctor, pharmacist, or health care provider.  2024 Elsevier/Gold Standard (2022-03-18 00:00:00)

## 2024-06-01 ENCOUNTER — Other Ambulatory Visit: Payer: Self-pay

## 2024-06-02 ENCOUNTER — Other Ambulatory Visit (HOSPITAL_COMMUNITY): Payer: Self-pay

## 2024-06-02 ENCOUNTER — Other Ambulatory Visit: Payer: Self-pay

## 2024-06-02 ENCOUNTER — Encounter: Payer: Self-pay | Admitting: Internal Medicine

## 2024-06-02 NOTE — Progress Notes (Signed)
 Patient started Enbrel  in office on 05/31/2024. Patient currently takes methotrexate  20mg  subcut once weekly with folic acid  2mg  daily and prednisone  20mg  daily   Patient will continue ENBREL  50mg  subcut every 7 days. CONTINUE methotrexate  20mg  (0.31mL) subcut every 7 days with folic acid  2mg  daily. DISCONTINUE hydroxychloroquine . TAPER prednisone  as follows: Two weeks after starting Enbrel , reduce the dose of prednisone  to 15 mg by mouth every morning  Prednisone  15 mg by mouth daily for 2 weeks Prednisone  10 mg by mouth daily for 2 weeks Prednisone  5 mg by mouth daily for 2 weeks Prednisone  2.5 mg by mouth daily for 2 weeks and then discontinue  Julia Solis, PharmD, MPH, BCPS, CPP Clinical Pharmacist (Rheumatology and Pulmonology)

## 2024-06-06 ENCOUNTER — Other Ambulatory Visit: Payer: Self-pay

## 2024-06-08 ENCOUNTER — Other Ambulatory Visit: Payer: Self-pay

## 2024-06-10 ENCOUNTER — Telehealth: Payer: Self-pay | Admitting: Internal Medicine

## 2024-06-10 NOTE — Telephone Encounter (Signed)
 I spoke with Julia Solis she is calling due to persistent dizziness symptoms since yesterday.  She had an episode lasting continuously about 3 hours on yesterday and has started again since this morning.  During symptoms yesterday she had some nausea but no other associated symptoms.   There were no other concerning features such as headache or other neurologic symptoms. She has previously experienced vertigo in the past treated with as needed meclizine  but has not been a problem for years.  She has not had any new events or medication changes since she started on Enbrel  in clinic about 2 weeks ago.  She also continues on subcutaneous methotrexate  but has severe sleepiness after she takes each dose lasting for up to 48 hours worse since she switched to the subcutaneous route.  She also remains on prednisone  as prescribed. I recommended she would be okay to try taking the meclizine  for vertigo symptoms. There are no significant drug interactions with her current prescriptions.

## 2024-06-12 ENCOUNTER — Other Ambulatory Visit: Payer: Self-pay | Admitting: *Deleted

## 2024-06-12 MED ORDER — PREDNISONE 5 MG PO TABS
ORAL_TABLET | ORAL | 0 refills | Status: DC
Start: 2024-06-12 — End: 2024-08-01

## 2024-06-12 NOTE — Telephone Encounter (Signed)
 Patient contacted the office stating she does not have enough Prednisone  for her taper. Patient advised she has had 2 doses of Enbrel . Patient is due to reduce her Prednisone  to 15 mg on 06/14/2024. Patient states she only hs enough Prednisone  to make it through 1 week of 15 mg.

## 2024-06-20 ENCOUNTER — Telehealth: Payer: Self-pay | Admitting: *Deleted

## 2024-06-20 NOTE — Telephone Encounter (Signed)
 Patient called, patient is feeling great on Enbrel  and wants to taper off prednisone . Please advise.

## 2024-06-20 NOTE — Telephone Encounter (Signed)
 Patient advised okay to taper off prednisone  as discussed during the last visit. She can do a faster taper instead of every 2 weeks she may taper every 4 days. Patient expressed understanding.

## 2024-06-20 NOTE — Telephone Encounter (Signed)
 Okay to taper off prednisone  as discussed during the last visit.  She can do a faster taper instead of every 2 weeks she may taper every 4 days.

## 2024-06-21 ENCOUNTER — Other Ambulatory Visit: Payer: Self-pay

## 2024-06-26 ENCOUNTER — Ambulatory Visit: Admitting: Physician Assistant

## 2024-06-27 ENCOUNTER — Encounter: Payer: Self-pay | Admitting: Rheumatology

## 2024-06-27 NOTE — Telephone Encounter (Signed)
 I recommend endocrinology referral as she has been on long-term prednisone .  Sometimes people develop adrenal insufficiency.  If patient is in agreement please place an endocrinology referral.

## 2024-06-28 DIAGNOSIS — M069 Rheumatoid arthritis, unspecified: Secondary | ICD-10-CM | POA: Diagnosis not present

## 2024-06-28 DIAGNOSIS — T380X5A Adverse effect of glucocorticoids and synthetic analogues, initial encounter: Secondary | ICD-10-CM | POA: Diagnosis not present

## 2024-06-28 DIAGNOSIS — D869 Sarcoidosis, unspecified: Secondary | ICD-10-CM | POA: Diagnosis not present

## 2024-06-28 DIAGNOSIS — R52 Pain, unspecified: Secondary | ICD-10-CM | POA: Diagnosis not present

## 2024-06-28 DIAGNOSIS — E242 Drug-induced Cushing's syndrome: Secondary | ICD-10-CM | POA: Diagnosis not present

## 2024-06-28 DIAGNOSIS — R509 Fever, unspecified: Secondary | ICD-10-CM | POA: Diagnosis not present

## 2024-06-29 ENCOUNTER — Other Ambulatory Visit: Payer: Self-pay | Admitting: *Deleted

## 2024-06-29 DIAGNOSIS — M0600 Rheumatoid arthritis without rheumatoid factor, unspecified site: Secondary | ICD-10-CM | POA: Diagnosis not present

## 2024-06-29 DIAGNOSIS — Z79899 Other long term (current) drug therapy: Secondary | ICD-10-CM

## 2024-06-29 DIAGNOSIS — Z111 Encounter for screening for respiratory tuberculosis: Secondary | ICD-10-CM | POA: Diagnosis not present

## 2024-06-29 NOTE — Progress Notes (Deleted)
 Office Visit Note  Patient: Julia Solis             Date of Birth: August 18, 1951           MRN: [id number]             PCP: Perri Ronal PARAS, MD Referring: Perri Ronal PARAS, MD Visit Date: 07/13/2024 Occupation: Data Unavailable  Subjective:  No chief complaint on file.   History of Present Illness: Julia Solis is a 73 y.o. female ***     Activities of Daily Living:  Patient reports morning stiffness for *** {minute/hour:19697}.   Patient {ACTIONS;DENIES/REPORTS:21021675::Denies} nocturnal pain.  Difficulty dressing/grooming: {ACTIONS;DENIES/REPORTS:21021675::Denies} Difficulty climbing stairs: {ACTIONS;DENIES/REPORTS:21021675::Denies} Difficulty getting out of chair: {ACTIONS;DENIES/REPORTS:21021675::Denies} Difficulty using hands for taps, buttons, cutlery, and/or writing: {ACTIONS;DENIES/REPORTS:21021675::Denies}  No Rheumatology ROS completed.   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   Dry eye syndrome of both lacrimal glands 10/16/2015   Elevated LDL cholesterol level 02/26/2015   Back pain 02/20/2014   Insomnia 02/20/2014   Allergic rhinitis 02/20/2014   Osteopenia 07/07/2012   Fatigue 08/17/2011   Nonspecific (abnormal) findings on radiological and other examination of body structure 06/24/2010   ABNORMAL LUNG XRAY 06/24/2010   History of ovarian cancer 06/23/2010    Past  Medical History:  Diagnosis Date   Cancer (HCC) 2011   ovarian   Complication of anesthesia    GERD (gastroesophageal reflux disease)    Hearing aid worn    both ears   History of chemotherapy 2011   History of hiatal hernia    History of kidney stones    Hypertension    MGUS (monoclonal gammopathy of unknown significance)    dx by hematology per patient   Neuromuscular disorder (HCC)    left foot numbness since  eback surgery   Osteoporosis    PONV (postoperative nausea and vomiting)    Presence of permanent cardiac pacemaker    Sarcoidosis    in remission sees pumonary  once a year dr bellinger lov 08-21-2020 care everywhere   Sick sinus syndrome (HCC)    Wears glasses     Family History  Problem Relation Age of Onset   Hypertension Mother    Prostate cancer Father 30   Cancer Father    Hypertension Father    Stroke Father    Colon polyps Sister    Colon cancer Maternal Grandmother        dx in her 77s   Heart disease Maternal Grandfather    Colon cancer Paternal Grandmother    Heart disease Paternal Grandfather    Breast cancer Paternal Aunt 58   Colon cancer Paternal Aunt        dx in her 44s   Melanoma Cousin 40   Breast cancer Cousin 81       multiple cousins   Colon polyps Cousin    Breast cancer Cousin 7   Breast cancer Cousin        father's  maternal 1st cousin   Breast cancer Cousin        Father's first cousin's daughter   Cancer Other        maternal great uncle with Cancer - NOS   Cancer Other        maternal great grandmother with either colon or stomach cancer   Breast cancer Other        fathers maternal aunt   Past Surgical History:  Procedure Laterality Date   ABDOMINAL HYSTERECTOMY  2011   APPENDECTOMY  2011    ?with hysterectomy   BACK SURGERY  1997   lumbar   CARPAL TUNNEL RELEASE Right    CATARACT EXTRACTION Right 08/10/2023   CATARACT EXTRACTION Left 08/24/2023   CESAREAN SECTION  1982, 1985   COSMETIC SURGERY  2007   brow  lift   CYSTOSCOPY WITH RETROGRADE PYELOGRAM, URETEROSCOPY AND STENT PLACEMENT Left 02/04/2021   Procedure: CYSTOSCOPY WITH RETROGRADE PYELOGRAM, URETEROSCOPY AND STENT PLACEMENT;  Surgeon: Rosalind Zachary NOVAK, MD;  Location: [facility];  Service: Urology;  Laterality: Left;  1 HR; nephrolithiasis   ECTOPIC PREGNANCY SURGERY  1980   EXTRACORPOREAL SHOCK WAVE LITHOTRIPSY Left 06/23/2018   Procedure: LEFT EXTRACORPOREAL SHOCK WAVE LITHOTRIPSY (ESWL);  Surgeon: Sherrilee Belvie CROME, MD;  Location: [facility];  Service: Urology;  Laterality: Left;   GANGLION CYST EXCISION  1975   HOLMIUM LASER APPLICATION Left 02/04/2021   Procedure: HOLMIUM LASER APPLICATION;  Surgeon: Rosalind Zachary NOVAK, MD;  Location: [facility];  Service: Urology;  Laterality: Left;   PACEMAKER IMPLANT N/A 11/13/2020   Procedure: PACEMAKER IMPLANT;  Surgeon: Kelsie Agent, MD;  Location: [facility];  Service: Cardiovascular;  Laterality: N/A;   TONSILLECTOMY  1957   adenoids removed   Social History   Tobacco Use   Smoking status: Former    Current packs/day: 0.00    Average packs/day: 1.5 packs/day for 11.0 years (16.5 ttl pk-yrs)    Types: Cigarettes    Start date: 10/05/1965    Quit date: 10/05/1976    Years since quitting: 47.7    Passive exposure: Past   Smokeless tobacco: Never  Vaping Use   Vaping status: Never Used  Substance Use Topics   Alcohol use: Not Currently   Drug use: No   Social History   Social History Narrative   Not on file     Immunization History  Administered Date(s) Administered   Fluad Quad(high Dose 65+) 06/22/2019, 06/15/2022   Fluad Trivalent(High Dose 65+) 07/06/2023   INFLUENZA, HIGH DOSE SEASONAL PF 06/22/2019, 07/01/2020, 06/17/2021, 12/16/2021   Influenza Nasal 07/06/2023   Influenza Split 07/07/2011, 07/07/2012, 07/04/2013   Influenza,inj,Quad PF,6+ Mos 07/09/2018   Influenza-Unspecified 07/05/2014, 06/26/2015, 07/05/2016, 07/29/2017    Moderna Covid-19 Fall Seasonal Vaccine 20yrs & older 01/24/2024   Moderna Sars-Covid-2 Vaccination 11/07/2019, 12/07/2019, 07/29/2020, 07/06/2023   PFIZER Comirnaty(Gray Top)Covid-19 Tri-Sucrose Vaccine 01/11/2021   Pfizer Covid-19 Vaccine Bivalent Booster 94yrs & up 06/17/2021, 01/27/2022   Pneumococcal Conjugate-13 02/26/2015, 04/19/2017   Pneumococcal Polysaccharide-23 10/05/2009, 07/11/2018, 12/16/2021   Respiratory Syncytial Virus Vaccine,Recomb Aduvanted(Arexvy) 07/16/2022   Tdap 06/05/2009, 03/19/2020   Unspecified SARS-COV-2 Vaccination 06/23/2022   Zoster Recombinant(Shingrix) 07/20/2023   Zoster, Live 10/05/2009     Objective: Vital Signs: There were no vitals taken for this visit.   Physical Exam   Musculoskeletal Exam: ***  CDAI Exam: CDAI Score: -- Patient Global: --; Provider Global: -- Swollen: --; Tender: -- Joint Exam 07/13/2024   No joint exam  has been documented for this visit   There is currently no information documented on the homunculus. Go to the Rheumatology activity and complete the homunculus joint exam.  Investigation: No additional findings.  Imaging: No results found.  Recent Labs: Lab Results  Component Value Date   WBC 14.2 (H) 05/18/2024   HGB 10.8 (L) 05/18/2024   PLT 290 05/18/2024   NA 138 05/25/2024   K 4.6 05/25/2024   CL 103 05/25/2024   CO2 28 05/25/2024   GLUCOSE 101 (H) 05/25/2024   BUN 26 (H) 05/25/2024   CREATININE 0.77 05/25/2024   BILITOT 0.4 05/25/2024   ALKPHOS 68 01/20/2024   AST 15 05/25/2024   ALT 12 05/25/2024   PROT 5.9 (L) 05/25/2024   ALBUMIN 3.7 01/20/2024   CALCIUM  8.8 05/25/2024   GFRAA 93 10/28/2020   QFTBGOLDPLUS NEGATIVE 07/06/2023    Speciality Comments: PLQ EYE EXAM 09/22/2023 normal [facility] f/u 12 months.  Procedures:  No procedures performed Allergies: Augmentin  [amoxicillin -pot clavulanate], Flomax [tamsulosin hcl], Latex, Tramadol , and Ciprofloxacin   Assessment  / Plan:     Visit Diagnoses: No diagnosis found.  Orders: No orders of the defined types were placed in this encounter.  No orders of the defined types were placed in this encounter.   Face-to-face time spent with patient was *** minutes. Greater than 50% of time was spent in counseling and coordination of care.  Follow-Up Instructions: No follow-ups on file.   Julia Solis, RT  Note - This record has been created using [employer].  Chart creation errors have been sought, but may not always  have been located. Such creation errors do not reflect on  the standard of medical care.

## 2024-06-30 ENCOUNTER — Ambulatory Visit: Payer: Self-pay | Admitting: Rheumatology

## 2024-06-30 NOTE — Progress Notes (Signed)
 White cell count is elevated due to prednisone  use.  Hemoglobin is low and stable.  CMP is normal.

## 2024-07-01 NOTE — Progress Notes (Signed)
 Remote PPM Transmission

## 2024-07-03 ENCOUNTER — Other Ambulatory Visit: Payer: Self-pay | Admitting: Oncology

## 2024-07-03 ENCOUNTER — Other Ambulatory Visit: Payer: Self-pay | Admitting: *Deleted

## 2024-07-03 ENCOUNTER — Telehealth: Payer: Self-pay

## 2024-07-03 ENCOUNTER — Ambulatory Visit: Payer: Self-pay | Admitting: Rheumatology

## 2024-07-03 ENCOUNTER — Encounter: Payer: Self-pay | Admitting: Oncology

## 2024-07-03 DIAGNOSIS — D509 Iron deficiency anemia, unspecified: Secondary | ICD-10-CM

## 2024-07-03 LAB — CBC WITH DIFFERENTIAL/PLATELET
Absolute Lymphocytes: 962 {cells}/uL (ref 850–3900)
Absolute Monocytes: 403 {cells}/uL (ref 200–950)
Basophils Absolute: 26 {cells}/uL (ref 0–200)
Basophils Relative: 0.2 %
Eosinophils Absolute: 117 {cells}/uL (ref 15–500)
Eosinophils Relative: 0.9 %
HCT: 35 % (ref 35.0–45.0)
Hemoglobin: 11.3 g/dL — ABNORMAL LOW (ref 11.7–15.5)
MCH: 29.6 pg (ref 27.0–33.0)
MCHC: 32.3 g/dL (ref 32.0–36.0)
MCV: 91.6 fL (ref 80.0–100.0)
MPV: 9.7 fL (ref 7.5–12.5)
Monocytes Relative: 3.1 %
Neutro Abs: 11492 {cells}/uL — ABNORMAL HIGH (ref 1500–7800)
Neutrophils Relative %: 88.4 %
Platelets: 229 Thousand/uL (ref 140–400)
RBC: 3.82 Million/uL (ref 3.80–5.10)
RDW: 16.4 % — ABNORMAL HIGH (ref 11.0–15.0)
Total Lymphocyte: 7.4 %
WBC: 13 Thousand/uL — ABNORMAL HIGH (ref 3.8–10.8)

## 2024-07-03 LAB — QUANTIFERON-TB GOLD PLUS
Mitogen-NIL: 0.97 [IU]/mL
NIL: 0.01 [IU]/mL
QuantiFERON-TB Gold Plus: NEGATIVE
TB1-NIL: 0 [IU]/mL
TB2-NIL: 0 [IU]/mL

## 2024-07-03 LAB — COMPREHENSIVE METABOLIC PANEL WITH GFR
AG Ratio: 1.7 (calc) (ref 1.0–2.5)
ALT: 10 U/L (ref 6–29)
AST: 17 U/L (ref 10–35)
Albumin: 3.6 g/dL (ref 3.6–5.1)
Alkaline phosphatase (APISO): 54 U/L (ref 37–153)
BUN: 18 mg/dL (ref 7–25)
CO2: 26 mmol/L (ref 20–32)
Calcium: 8.9 mg/dL (ref 8.6–10.4)
Chloride: 104 mmol/L (ref 98–110)
Creat: 0.75 mg/dL (ref 0.60–1.00)
Globulin: 2.1 g/dL (ref 1.9–3.7)
Glucose, Bld: 88 mg/dL (ref 65–99)
Potassium: 4.1 mmol/L (ref 3.5–5.3)
Sodium: 140 mmol/L (ref 135–146)
Total Bilirubin: 0.6 mg/dL (ref 0.2–1.2)
Total Protein: 5.7 g/dL — ABNORMAL LOW (ref 6.1–8.1)
eGFR: 85 mL/min/1.73m2 (ref >=60)

## 2024-07-03 MED ORDER — ALENDRONATE SODIUM 70 MG PO TABS: 70.0000 mg | ORAL_TABLET | ORAL | 0 refills | Status: DC

## 2024-07-03 MED ORDER — METHOTREXATE SODIUM CHEMO INJECTION 50 MG/2ML: 20.0000 mg | INTRAMUSCULAR | 0 refills | Status: DC

## 2024-07-03 NOTE — Telephone Encounter (Signed)
 Patient requested a refill on MTX and Fosamax .   Last Fill: 04/26/2024 (MTX), 04/05/2024 (Fosamax )  Labs: 06/29/2024 White cell count is elevated due to prednisone  use. Hemoglobin is low and stable. CMP is normal.   Next Visit: 07/13/2024  Last Visit: 05/31/2024  DX:  Seronegative rheumatoid arthritis   Age-related osteoporosis without current pathological fracture   Current Dose per office note 05/31/2024: methotrexate  0.8 mL subcu weekly Fosamax  70 mg p.o. weekly   Okay to refill Methotrexate  and Fosamax ?

## 2024-07-03 NOTE — Telephone Encounter (Signed)
 Labs received from:[facility] on: 06/28/2024  Reviewed by: Dr. Dolphus  Labs drawn: CRP, quant  Results: CRP: 67  Patient is on enbrel  50mg  weekly and prednisone .  Copy of lab sent to the scan center.

## 2024-07-03 NOTE — Progress Notes (Signed)
 TB Gold is negative.

## 2024-07-03 NOTE — Progress Notes (Signed)
 TB gold negative

## 2024-07-04 ENCOUNTER — Ambulatory Visit (INDEPENDENT_AMBULATORY_CARE_PROVIDER_SITE_OTHER): Admitting: Otolaryngology

## 2024-07-04 ENCOUNTER — Encounter (INDEPENDENT_AMBULATORY_CARE_PROVIDER_SITE_OTHER): Payer: Self-pay | Admitting: Otolaryngology

## 2024-07-04 VITALS — BP 121/76 | HR 82 | Temp 98.5°F

## 2024-07-04 DIAGNOSIS — J343 Hypertrophy of nasal turbinates: Secondary | ICD-10-CM | POA: Diagnosis not present

## 2024-07-04 DIAGNOSIS — J310 Chronic rhinitis: Secondary | ICD-10-CM | POA: Diagnosis not present

## 2024-07-04 DIAGNOSIS — J342 Deviated nasal septum: Secondary | ICD-10-CM

## 2024-07-04 DIAGNOSIS — R0981 Nasal congestion: Secondary | ICD-10-CM

## 2024-07-04 NOTE — Progress Notes (Unsigned)
 Patient ID: Julia Solis, female   DOB: 07/17/1951, 73 y.o.   MRN: [id number]  Follow-up: Chronic nasal congestion, recurrent sinusitis  HPI: The patient is a 73 year old female who returns today for her follow-up evaluation.  She was last seen 6 months ago.  At that time, she was recovering from an acute sinusitis.  She was noted to have nasal mucosal congestion, nasal septal deviation, and bilateral inferior turbinate hypertrophy.  She was treated with Flonase nasal spray and nasal saline irrigation.  The patient returns today complaining of mild nasal congestion.  She has not had any recent sinusitis.  Due to her rheumatoid arthritis exacerbations, she is currently on long-term steroids.  Currently she denies any facial pain, fever, or visual change.  Exam: General: Communicates without difficulty, well nourished, no acute distress. Head: Normocephalic, no evidence injury, no tenderness, facial buttresses intact without stepoff. Face/sinus: No tenderness to palpation and percussion. Facial movement is normal and symmetric. Eyes: PERRL, EOMI. No scleral icterus, conjunctivae clear. Neuro: CN II exam reveals vision grossly intact.  No nystagmus at any point of gaze. Ears: Auricles well formed without lesions.  Ear canals are intact without mass or lesion.  No erythema or edema is appreciated.  The TMs are intact without fluid. Nose: External evaluation reveals normal support and skin without lesions.  Dorsum is intact.  Anterior rhinoscopy reveals congested mucosa over anterior aspect of inferior turbinates and deviated septum.  No purulence noted. Oral:  Oral cavity and oropharynx are intact, symmetric, without erythema or edema.  Mucosa is moist without lesions. Neck: Full range of motion without pain.  There is no significant lymphadenopathy.  No masses palpable.  Thyroid  bed within normal limits to palpation.  Parotid glands and submandibular glands equal bilaterally without mass.  Trachea is  midline. Neuro:  CN 2-12 grossly intact.   Assessment: 1.  Chronic rhinitis with nasal mucosal congestion, nasal septal deviation, and bilateral inferior turbinate hypertrophy. 2.  No acute infection or purulent drainage is noted today.  Plan: 1.  The physical exam findings are reviewed with the patient. 2.  Continue with Flonase nasal spray and nasal saline irrigation. 3.  The patient will return for reevaluation in 6 months, sooner if needed.

## 2024-07-05 DIAGNOSIS — M069 Rheumatoid arthritis, unspecified: Secondary | ICD-10-CM | POA: Diagnosis not present

## 2024-07-05 DIAGNOSIS — D72828 Other elevated white blood cell count: Secondary | ICD-10-CM | POA: Diagnosis not present

## 2024-07-05 DIAGNOSIS — J310 Chronic rhinitis: Secondary | ICD-10-CM | POA: Insufficient documentation

## 2024-07-05 DIAGNOSIS — D869 Sarcoidosis, unspecified: Secondary | ICD-10-CM | POA: Diagnosis not present

## 2024-07-05 DIAGNOSIS — R52 Pain, unspecified: Secondary | ICD-10-CM | POA: Diagnosis not present

## 2024-07-05 DIAGNOSIS — Z950 Presence of cardiac pacemaker: Secondary | ICD-10-CM | POA: Diagnosis not present

## 2024-07-05 DIAGNOSIS — E242 Drug-induced Cushing's syndrome: Secondary | ICD-10-CM | POA: Diagnosis not present

## 2024-07-05 DIAGNOSIS — R509 Fever, unspecified: Secondary | ICD-10-CM | POA: Diagnosis not present

## 2024-07-05 DIAGNOSIS — D72829 Elevated white blood cell count, unspecified: Secondary | ICD-10-CM | POA: Diagnosis not present

## 2024-07-10 ENCOUNTER — Telehealth: Payer: Self-pay

## 2024-07-10 NOTE — Telephone Encounter (Signed)
 Labs received from:[facility] (Dr. Micheline office)  Drawn on: 07/05/2024   Reviewed by: Dr. Dolphus   Labs drawn: CRP, quant   Results: CRP: 130   Patient is on enbrel  50mg  weekly and prednisone .  Copy of lab sent to the scan center and copy kept for patient's follow up appointment on 07/13/2024.

## 2024-07-11 ENCOUNTER — Telehealth: Payer: Self-pay

## 2024-07-11 NOTE — Telephone Encounter (Signed)
 Labs received from: [facility] (Dr. Micheline office)  Drawn on: 07/05/2024  Reviewed by: Dr. Dolphus   Labs drawn: CBC w/diff, urine dip w/reflex, CRP  Results: WBC 16.2 RBC 3.88 HGB 11.2 HCT 34.6 RDW 19.6 NEUT % 91.0 LYMPH % 6.4 MONO % 2.1 NEUT# 14.7 LYMPH# 1.00  CRP 130  Urine dip: trace-lyseda blood   Copy of labs sent to the scan center.

## 2024-07-11 NOTE — Telephone Encounter (Signed)
 Chest x-ray received from: [facility]   Date: 07/05/2024  Reviewed by: Dr. Dolphus  Results:  No acute abnormalities  Chronic mild scarring in the bilateral upper lobes.   Copy of report sent to the scan center.

## 2024-07-12 ENCOUNTER — Encounter: Payer: Self-pay | Admitting: Physician Assistant

## 2024-07-12 ENCOUNTER — Ambulatory Visit: Attending: Physician Assistant | Admitting: Physician Assistant

## 2024-07-12 ENCOUNTER — Other Ambulatory Visit: Payer: Self-pay

## 2024-07-12 VITALS — BP 131/80 | HR 85 | Temp 98.2°F | Resp 14 | Ht 64.5 in | Wt 181.2 lb

## 2024-07-12 DIAGNOSIS — H18453 Nodular corneal degeneration, bilateral: Secondary | ICD-10-CM

## 2024-07-12 DIAGNOSIS — R5383 Other fatigue: Secondary | ICD-10-CM

## 2024-07-12 DIAGNOSIS — Z950 Presence of cardiac pacemaker: Secondary | ICD-10-CM | POA: Diagnosis not present

## 2024-07-12 DIAGNOSIS — C569 Malignant neoplasm of unspecified ovary: Secondary | ICD-10-CM | POA: Diagnosis not present

## 2024-07-12 DIAGNOSIS — R7689 Other specified abnormal immunological findings in serum: Secondary | ICD-10-CM | POA: Diagnosis not present

## 2024-07-12 DIAGNOSIS — Z79899 Other long term (current) drug therapy: Secondary | ICD-10-CM | POA: Diagnosis not present

## 2024-07-12 DIAGNOSIS — Z7952 Long term (current) use of systemic steroids: Secondary | ICD-10-CM

## 2024-07-12 DIAGNOSIS — D869 Sarcoidosis, unspecified: Secondary | ICD-10-CM

## 2024-07-12 DIAGNOSIS — L308 Other specified dermatitis: Secondary | ICD-10-CM | POA: Diagnosis not present

## 2024-07-12 DIAGNOSIS — M0600 Rheumatoid arthritis without rheumatoid factor, unspecified site: Secondary | ICD-10-CM

## 2024-07-12 DIAGNOSIS — M810 Age-related osteoporosis without current pathological fracture: Secondary | ICD-10-CM

## 2024-07-12 DIAGNOSIS — D472 Monoclonal gammopathy: Secondary | ICD-10-CM

## 2024-07-12 DIAGNOSIS — R509 Fever, unspecified: Secondary | ICD-10-CM

## 2024-07-12 DIAGNOSIS — E242 Drug-induced Cushing's syndrome: Secondary | ICD-10-CM

## 2024-07-12 DIAGNOSIS — R001 Bradycardia, unspecified: Secondary | ICD-10-CM

## 2024-07-12 DIAGNOSIS — G4733 Obstructive sleep apnea (adult) (pediatric): Secondary | ICD-10-CM

## 2024-07-12 DIAGNOSIS — I272 Pulmonary hypertension, unspecified: Secondary | ICD-10-CM

## 2024-07-12 DIAGNOSIS — H04123 Dry eye syndrome of bilateral lacrimal glands: Secondary | ICD-10-CM

## 2024-07-12 NOTE — Patient Instructions (Signed)
 Standing Labs We placed an order today for your standing lab work.   Please have your standing labs drawn at end December and every 3 months   Please have your labs drawn 2 weeks prior to your appointment so that the provider can discuss your lab results at your appointment, if possible.  Please note that you may see your imaging and lab results in MyChart before we have reviewed them. We will contact you once all results are reviewed. Please allow our office up to 72 hours to thoroughly review all of the results before contacting the office for clarification of your results.  WALK-IN LAB HOURS  Monday through Thursday from 8:00 am -12:30 pm and 1:00 pm-4:30 pm and Friday from 8:00 am-12:00 pm.  Patients with office visits requiring labs will be seen before walk-in labs.  You may encounter longer than normal wait times. Please allow additional time. Wait times may be shorter on  Monday and Thursday afternoons.  We do not book appointments for walk-in labs. We appreciate your patience and understanding with our staff.   Labs are drawn by [facility]. Please bring your co-pay at the time of your lab draw.  You may receive a bill from [facility] for your lab work.  Please note if you are on Hydroxychloroquine  and and an order has been placed for a Hydroxychloroquine  level,  you will need to have it drawn 4 hours or more after your last dose.  If you wish to have your labs drawn at another location, please call the office 24 hours in advance so we can fax the orders.  The office is located at [address], [address], [place], [place] [address]   If you have any questions regarding directions or hours of operation,  please call [phone].   As a reminder, please drink plenty of water prior to coming for your lab work. Thanks!

## 2024-07-12 NOTE — Progress Notes (Signed)
 Office Visit Note  Patient: Julia Solis             Date of Birth: 05/28/1951           MRN: [id number]             PCP: Perri Ronal PARAS, MD Referring: Perri Ronal PARAS, MD Visit Date: 07/12/2024 Occupation: Data Unavailable  Subjective:  Daily fevers   History of Present Illness: Julia Solis is a 73 y.o. female with history of rheumatoid arthritis.  Patient is currently on Enbrel  50 mg subcutaneous injections once weekly, methotrexate  20 mg sq injections once weekly, and folic acid  2 mg daily.  She was started on Enbrel  on 05/31/24.  Patient had her seventh dose of Enbrel  today.  She has not had any recent gaps in therapy. Patient has established care with Dr. Faythe at [facility].  She has been switched from prednisone  to hydrocortisone 30 mg between 6am and 8 am and 10 mg around 2pm.  She continues to have some pain and stiffness in both hands as well as TMJ bilaterally.  At times she has difficulty chewing and even opening her mouth to brush her teeth due to stiffness in her jaw. Patient states that she was evaluated by her ENT recently.  Patient was told that she had a lot of inflammation but no active infection.  Patient has still been getting daily fevers in the middle of the night.  Patient states that during the fevers she experiences body aches and a sore throat.  Patient states that these fevers do not occur throughout the day.  She often has to take oxycodone  to help with the body aches she experiences.    Activities of Daily Living:  Patient reports morning stiffness for unsure how many minutes.   Patient Reports nocturnal pain.  Difficulty dressing/grooming: Denies Difficulty climbing stairs: Reports Difficulty getting out of chair: Reports Difficulty using hands for taps, buttons, cutlery, and/or writing: Reports  Review of Systems  Constitutional:  Positive for fatigue.  HENT:  Negative for mouth sores and mouth dryness.   Eyes:  Positive for  dryness.  Respiratory:  Positive for shortness of breath.   Cardiovascular:  Negative for chest pain and palpitations.  Gastrointestinal:  Negative for blood in stool, constipation and diarrhea.  Endocrine: Negative for increased urination.  Genitourinary:  Negative for involuntary urination.  Musculoskeletal:  Positive for joint pain, gait problem, joint pain, myalgias, muscle weakness, morning stiffness, muscle tenderness and myalgias. Negative for joint swelling.  Skin:  Positive for color change. Negative for rash, hair loss and sensitivity to sunlight.  Allergic/Immunologic: Negative for susceptible to infections.  Neurological:  Negative for dizziness and headaches.  Hematological:  Negative for swollen glands.  Psychiatric/Behavioral:  Positive for sleep disturbance. Negative for depressed mood. The patient is not nervous/anxious.     PMFS History:  Patient Active Problem List   Diagnosis Date Noted   Chronic rhinitis 07/05/2024   Acute recurrent maxillary sinusitis 12/14/2023   Deviated nasal septum 12/14/2023   Hypertrophy of nasal turbinates 12/14/2023   Seronegative rheumatoid arthritis (HCC) 08/23/2023   Monoclonal gammopathy of unknown significance 08/03/2023   Primary osteoarthritis involving multiple joints 08/03/2023   Edema 06/26/2023   Hand arthritis 04/27/2023   Symptomatic sinus bradycardia 03/20/2022   Pacemaker 03/20/2022   Sarcoidosis 10/31/2018   Dry eye syndrome 10/16/2015   NS (nuclear sclerosis) 10/16/2015   Dystrophy, 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   Dry eye syndrome of both  lacrimal glands 10/16/2015   Elevated LDL cholesterol level 02/26/2015   Back pain 02/20/2014   Insomnia 02/20/2014   Allergic rhinitis 02/20/2014   Osteopenia 07/07/2012   Fatigue 08/17/2011   Nonspecific (abnormal) findings on radiological and other examination of body structure 06/24/2010   ABNORMAL LUNG XRAY 06/24/2010   History of ovarian cancer 06/23/2010    Past Medical  History:  Diagnosis Date   Cancer (HCC) 2011   ovarian   Complication of anesthesia    GERD (gastroesophageal reflux disease)    Hearing aid worn    both ears   History of chemotherapy 2011   History of hiatal hernia    History of kidney stones    Hypertension    MGUS (monoclonal gammopathy of unknown significance)    dx by hematology per patient   Neuromuscular disorder (HCC)    left foot numbness since  eback surgery   Osteoporosis    PONV (postoperative nausea and vomiting)    Presence of permanent cardiac pacemaker    Sarcoidosis    in remission sees pumonary  once a year dr bellinger lov 08-21-2020 care everywhere   Sick sinus syndrome (HCC)    Wears glasses     Family History  Problem Relation Age of Onset   Hypertension Mother    Prostate cancer Father 60   Cancer Father    Hypertension Father    Stroke Father    Colon polyps Sister    Colon cancer Maternal Grandmother        dx in her 79s   Heart disease Maternal Grandfather    Colon cancer Paternal Grandmother    Heart disease Paternal Grandfather    Breast cancer Paternal Aunt 33   Colon cancer Paternal Aunt        dx in her 7s   Melanoma Cousin 2   Breast cancer Cousin 80       multiple cousins   Colon polyps Cousin    Breast cancer Cousin 56   Breast cancer Cousin        father's maternal 1st cousin   Breast cancer Cousin        Father's first cousin's daughter   Cancer Other        maternal great uncle with Cancer - NOS   Cancer Other        maternal great grandmother with either colon or stomach cancer   Breast cancer Other        fathers maternal aunt   Past Surgical History:  Procedure Laterality Date   ABDOMINAL HYSTERECTOMY  2011   APPENDECTOMY  2011    ?with hysterectomy   BACK SURGERY  1997   lumbar   CARPAL TUNNEL RELEASE Right    CATARACT EXTRACTION Right 08/10/2023   CATARACT EXTRACTION Left 08/24/2023   CESAREAN SECTION  1982, 1985   COSMETIC SURGERY  2007   brow lift    CYSTOSCOPY WITH RETROGRADE PYELOGRAM, URETEROSCOPY AND STENT PLACEMENT Left 02/04/2021   Procedure: CYSTOSCOPY WITH RETROGRADE PYELOGRAM, URETEROSCOPY AND STENT PLACEMENT;  Surgeon: Rosalind Zachary NOVAK, MD;  Location[facility];  Service: Urology;  Laterality: Left;  1 HR; nephrolithiasis   ECTOPIC PREGNANCY SURGERY  1980   EXTRACORPOREAL SHOCK WAVE LITHOTRIPSY Left 06/23/2018   Procedure: LEFT EXTRACORPOREAL SHOCK WAVE LITHOTRIPSY (ESWL);  Surgeon: Sherrilee Belvie CROME, MD;  Location: [facility];  Service: Urology;  Laterality: Left;   GANGLION CYST EXCISION  1975   HOLMIUM LASER APPLICATION Left 02/04/2021  Procedure: HOLMIUM LASER APPLICATION;  Surgeon: Rosalind Zachary NOVAK, MD;  Location: [facility]  Service: Urology;  Laterality: Left;   PACEMAKER IMPLANT N/A 11/13/2020   Procedure: PACEMAKER IMPLANT;  Surgeon: Kelsie Agent, MD;  Location: [facility];  Service: Cardiovascular;  Laterality: N/A;   TONSILLECTOMY  1957   adenoids removed   Social History   Tobacco Use   Smoking status: Former    Current packs/day: 0.00    Average packs/day: 1.5 packs/day for 11.0 years (16.5 ttl pk-yrs)    Types: Cigarettes    Start date: 10/05/1965    Quit date: 10/05/1976    Years since quitting: 47.8    Passive exposure: Past   Smokeless tobacco: Never  Vaping Use   Vaping status: Never Used  Substance Use Topics   Alcohol use: Not Currently   Drug use: No   Social History   Social History Narrative   Not on file     Immunization History  Administered Date(s) Administered   Fluad Quad(high Dose 65+) 06/22/2019, 06/15/2022   Fluad Trivalent(High Dose 65+) 07/06/2023   INFLUENZA, HIGH DOSE SEASONAL PF 06/22/2019, 07/01/2020, 06/17/2021, 12/16/2021   Influenza Nasal 07/06/2023   Influenza Split 07/07/2011, 07/07/2012, 07/04/2013   Influenza,inj,Quad PF,6+ Mos 07/09/2018   Influenza-Unspecified 07/05/2014, 06/26/2015, 07/05/2016, 07/29/2017   Moderna  Covid-19 Fall Seasonal Vaccine 76yrs & older 01/24/2024   Moderna Sars-Covid-2 Vaccination 11/07/2019, 12/07/2019, 07/29/2020, 07/06/2023   PFIZER Comirnaty(Gray Top)Covid-19 Tri-Sucrose Vaccine 01/11/2021   Pfizer Covid-19 Vaccine Bivalent Booster 18yrs & up 06/17/2021, 01/27/2022   Pneumococcal Conjugate-13 02/26/2015, 04/19/2017   Pneumococcal Polysaccharide-23 10/05/2009, 07/11/2018, 12/16/2021   Respiratory Syncytial Virus Vaccine,Recomb Aduvanted(Arexvy) 07/16/2022   Tdap 06/05/2009, 03/19/2020   Unspecified SARS-COV-2 Vaccination 06/23/2022   Zoster Recombinant(Shingrix) 07/20/2023   Zoster, Live 10/05/2009     Objective: Vital Signs: BP 131/80   Pulse 85   Temp 98.2 F (36.8 C)   Resp 14   Ht 5' 4.5 (1.638 m)   Wt 181 lb 3.2 oz (82.2 kg)   BMI 30.62 kg/m    Physical Exam Vitals and nursing note reviewed.  Constitutional:      Appearance: She is well-developed.  HENT:     Head: Normocephalic and atraumatic.  Eyes:     Conjunctiva/sclera: Conjunctivae normal.  Cardiovascular:     Rate and Rhythm: Normal rate and regular rhythm.     Heart sounds: Normal heart sounds.  Pulmonary:     Effort: Pulmonary effort is normal.     Breath sounds: Normal breath sounds.  Abdominal:     General: Bowel sounds are normal.     Palpations: Abdomen is soft.  Musculoskeletal:     Cervical back: Normal range of motion.  Lymphadenopathy:     Cervical: No cervical adenopathy.  Skin:    General: Skin is warm and dry.     Capillary Refill: Capillary refill takes less than 2 seconds.  Neurological:     Mental Status: She is alert and oriented to person, place, and time.  Psychiatric:        Behavior: Behavior normal.      Musculoskeletal Exam: C-spine, thoracic spine, lumbar spine have good range of motion.  No midline spinal tenderness.  No SI joint tenderness.  Shoulder joints, elbow joints, wrist joints, MCPs, PIPs, DIPs have good range of motion with no synovitis.   Discoloration and tenderness noted overlying the left third MCP joint.  PIP and DIP thickening noted.  Complete fist formation bilaterally.  Hip joints have good range of motion with no groin pain.  Knee joints have good range of motion no warmth or effusion.  Ankle joints have good range of motion no tenderness or joint swelling.  No evidence of Achilles tendinitis or plantar fasciitis.   CDAI Exam: CDAI Score: -- Patient Global: --; Provider Global: -- Swollen: --; Tender: -- Joint Exam 07/12/2024   No joint exam has been documented for this visit   There is currently no information documented on the homunculus. Go to the Rheumatology activity and complete the homunculus joint exam.  Investigation: No additional findings.  Imaging: No results found.  Recent Labs: Lab Results  Component Value Date   WBC 13.0 (H) 06/29/2024   HGB 11.3 (L) 06/29/2024   PLT 229 06/29/2024   NA 140 06/29/2024   K 4.1 06/29/2024   CL 104 06/29/2024   CO2 26 06/29/2024   GLUCOSE 88 06/29/2024   BUN 18 06/29/2024   CREATININE 0.75 06/29/2024   BILITOT 0.6 06/29/2024   ALKPHOS 68 01/20/2024   AST 17 06/29/2024   ALT 10 06/29/2024   PROT 5.7 (L) 06/29/2024   ALBUMIN 3.7 01/20/2024   CALCIUM  8.9 06/29/2024   GFRAA 93 10/28/2020   QFTBGOLDPLUS NEGATIVE 06/29/2024    Speciality Comments: PLQ EYE EXAM 09/22/2023 normal [facility] f/u 12 months.  Procedures:  No procedures performed Allergies: Augmentin  [amoxicillin -pot clavulanate], Flomax [tamsulosin hcl], Latex, Tramadol , and Ciprofloxacin    Assessment / Plan:     Visit Diagnoses: Seronegative rheumatoid arthritis (HCC) - Ultrasound performed on 02/24/2024--positive for synovitis and tenosynovitis involving both hands: Patient is currently on Enbrel  50 mg sq injections once weekly and methotrexate  0.8 mL sq injections once weekly.  Enbrel  was started on 05/31/2024 and she had her seventh dose today.  She has tolerated  Enbrel  without any side effects and has not had any gaps in therapy.  She continues to have some stiffness and soreness involving both hands but her symptoms seem to be improving on the current treatment regimen. She has ongoing symptoms of TMJ bilaterally-stiffness improves with activity.   Her primary concern remains daily fevers in the middle of the night with associated symptoms including body aches and sore throat.  The only thing that relieves these fevers is taking prednisone  greater than 20 mg daily.  She has had to take oxycodone  to alleviate the body aches.  ENT has ruled out infection.  She has been evaluated by Dr. Faythe at [facility] endocr[facility]ology and has been diagnosed with iatrogenic Cushing's syndrome and has been switched to hydrocortisone 30 mg between 6am and 8 am and 10 mg around 2pm.  Dr. Faythe suspects the fever and pain are due to steroid responsive disease rather than insufficient adrenal replacement therapy. Her CRP has remained elevated and was 130 on 07/05/2024 but her joint inflammation seems to be better controlled on the current treatment regimen.   Chest x-ray on 07/05/2024 did not reveal any acute abnormalities.  Chronic mild scarring in the bilateral upper lobes noted.   May need to consider malignancy workup/rule out paraneoplastic syndrome contributing to inflammatory arthritis symptoms.   Discussed the patient's current situation with Dr. Dolphus who also joined the visit.  We do not have an explanation for the recurrent fevers she is experiencing.   A referral to [facility] rheumatology will be placed urgently as recommended by Dr. Dolphus for further evaluation and a second opinion.  The patient voiced understanding.  She plans to continue to  follow-up with Dr. Faythe closely.  High risk medication use - Enbrel  50 mg sq injections once weekly, methotrexate  0.8 mL sq injections once weekly, and  folic acid  2 mg by mouth daily. TB gold negative on 06/29/24.  CBC and CMP  updated on 06/29/24.  PLQ EYE EXAM 09/22/2023 normal [facility] f/u 12 months.   Iatrogenic Cushing's syndrome: Under the care of Dr. Laban on hydrocortisone twice daily.Reviewed Dr. Micheline office visit note from 07/05/2024:  weight gain, high-normal blood pressure, and other features not consistent with undertreated adrenal insufficiency.  Her fever and pain are suspected to be due to steroid responsive disease rather than insufficient adrenal replacement therapy.  Positive ANA (antinuclear antibody) - Low titer positive ANA, ENA panel and complements are normal.  No clinical features of systemic lupus.  Current chronic use of systemic steroids - Recurrent use of prednisone  since February 2024.  Patient has has established care with Dr. Faythe and has been diagnosed with iatrogenic Cushing's syndrome.  She has been switched to hydrocortisone 30 mg between 6am and 8 am and 10 mg around 2pm.   Fever of unknown origin; Nightly-as high as 103.5 per patient. Associated symptoms: Body aches and sore throat.  No fever during the daily time.  Persistently elevated inflammatory markers.  ENT has rule out infection. Chest x-ray did not reveal any acute abnormalities on 07/05/2024. RA is improving on current treatment regimen.  Not explained by insufficient adrenal replacement therapy per Dr. Faythe.  Plan to send to [facility] for a second opinion.   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  She remains under the care of pulmonology yearly.  Chest x-ray December 2024 showed no active disease. CXR ordered by Dr. Faythe on 07/05/24--no acute abnormalities.  Chronic mild scarring in the bilateral upper lobes noted. Plan refer the patient to [facility] for an urgent referral as a second opinion.   Dry eye syndrome of both eyes: She is using systane eye drops for dry eyes.   Age-related osteoporosis without current pathological fracture - DEXA  01/08/2024:The BMD measured at [facility] L1-L4 is 0.834 g/cm2 with a T-score of -2.9.  She has been taking Fosamax  70 mg by mouth. weekly since April 2025.    Other fatigue: Chronic.    Other medical conditions are listed as follows:   Other eczema  Malignant neoplasm of ovary, unspecified laterality (HCC)  Pacemaker  MGUS (monoclonal gammopathy of unknown significance)  Symptomatic sinus bradycardia  Obstructive sleep apnea  Pulmonary hypertension (HCC)  Salzmann's nodular dystrophy of both eyes   Orders: No orders of the defined types were placed in this encounter.  No orders of the defined types were placed in this encounter.    Follow-Up Instructions: Return in 3 months (on 10/12/2024) for Rheumatoid arthritis.   Waddell CHRISTELLA Craze, PA-C  Note - This record has been created using Dragon software.  Chart creation errors have been sought, but may not always  have been located. Such creation errors do not reflect on  the standard of medical care.

## 2024-07-12 NOTE — Progress Notes (Unsigned)
 Patient seen in office today. Per Waddell Craze, place urgent referral to [facility]. Please review and sign.

## 2024-07-13 ENCOUNTER — Ambulatory Visit: Admitting: Physician Assistant

## 2024-07-13 DIAGNOSIS — Z79899 Other long term (current) drug therapy: Secondary | ICD-10-CM

## 2024-07-13 DIAGNOSIS — I272 Pulmonary hypertension, unspecified: Secondary | ICD-10-CM

## 2024-07-13 DIAGNOSIS — Z7952 Long term (current) use of systemic steroids: Secondary | ICD-10-CM

## 2024-07-13 DIAGNOSIS — R778 Other specified abnormalities of plasma proteins: Secondary | ICD-10-CM

## 2024-07-13 DIAGNOSIS — L308 Other specified dermatitis: Secondary | ICD-10-CM

## 2024-07-13 DIAGNOSIS — R001 Bradycardia, unspecified: Secondary | ICD-10-CM

## 2024-07-13 DIAGNOSIS — R768 Other specified abnormal immunological findings in serum: Secondary | ICD-10-CM

## 2024-07-13 DIAGNOSIS — M810 Age-related osteoporosis without current pathological fracture: Secondary | ICD-10-CM

## 2024-07-13 DIAGNOSIS — H04123 Dry eye syndrome of bilateral lacrimal glands: Secondary | ICD-10-CM

## 2024-07-13 DIAGNOSIS — G4709 Other insomnia: Secondary | ICD-10-CM

## 2024-07-13 DIAGNOSIS — D472 Monoclonal gammopathy: Secondary | ICD-10-CM

## 2024-07-13 DIAGNOSIS — G4733 Obstructive sleep apnea (adult) (pediatric): Secondary | ICD-10-CM

## 2024-07-13 DIAGNOSIS — H18453 Nodular corneal degeneration, bilateral: Secondary | ICD-10-CM

## 2024-07-13 DIAGNOSIS — D869 Sarcoidosis, unspecified: Secondary | ICD-10-CM

## 2024-07-13 DIAGNOSIS — M0600 Rheumatoid arthritis without rheumatoid factor, unspecified site: Secondary | ICD-10-CM

## 2024-07-13 DIAGNOSIS — R5383 Other fatigue: Secondary | ICD-10-CM

## 2024-07-13 DIAGNOSIS — Z950 Presence of cardiac pacemaker: Secondary | ICD-10-CM

## 2024-07-13 DIAGNOSIS — C569 Malignant neoplasm of unspecified ovary: Secondary | ICD-10-CM

## 2024-07-13 NOTE — Telephone Encounter (Signed)
 Corrected

## 2024-07-14 DIAGNOSIS — D869 Sarcoidosis, unspecified: Secondary | ICD-10-CM | POA: Diagnosis not present

## 2024-07-14 DIAGNOSIS — D72828 Other elevated white blood cell count: Secondary | ICD-10-CM | POA: Diagnosis not present

## 2024-07-14 DIAGNOSIS — E242 Drug-induced Cushing's syndrome: Secondary | ICD-10-CM | POA: Diagnosis not present

## 2024-07-14 DIAGNOSIS — D472 Monoclonal gammopathy: Secondary | ICD-10-CM | POA: Diagnosis not present

## 2024-07-14 DIAGNOSIS — M069 Rheumatoid arthritis, unspecified: Secondary | ICD-10-CM | POA: Diagnosis not present

## 2024-07-14 DIAGNOSIS — R52 Pain, unspecified: Secondary | ICD-10-CM | POA: Diagnosis not present

## 2024-07-14 DIAGNOSIS — R509 Fever, unspecified: Secondary | ICD-10-CM | POA: Diagnosis not present

## 2024-07-14 DIAGNOSIS — D72829 Elevated white blood cell count, unspecified: Secondary | ICD-10-CM | POA: Diagnosis not present

## 2024-07-17 ENCOUNTER — Other Ambulatory Visit: Payer: Self-pay | Admitting: Rheumatology

## 2024-07-17 ENCOUNTER — Encounter: Payer: Self-pay | Admitting: Oncology

## 2024-07-17 NOTE — Telephone Encounter (Signed)
 Patient states she saw Dr. Faythe. Patient states he would like her to go back on Prednisone  20 mg. Patient states Dr. Faythe feels the benefits outweigh the risk. Patient states Dr. Faythe states he will help her to wean off the Prednisone  later after she is better. Patient states she is needing a refill sent to the pharmacy. Patient states since she has been on a lower dose she did go ahead and get her vaccines. Patient would like to know how long she should wait to increase the prednisone  again so she may receive the maximum benefit of the vaccines. Please advise.

## 2024-07-17 NOTE — Telephone Encounter (Signed)
 Please review and sign

## 2024-07-17 NOTE — Addendum Note (Signed)
 Addended by: Rocco Kerkhoff M on: 07/17/2024 01:03 PM   Modules accepted: Orders

## 2024-07-17 NOTE — Telephone Encounter (Signed)
 I reviewed Dr. Micheline note from 07/14/24--I do not see that he recommended increasing the dose of prednisone  to 20 mg daily.  Recommended continuing hydrocortisone but there is no mention of increasing prednisone  dose back to 20 mg daily.

## 2024-07-17 NOTE — Telephone Encounter (Signed)
 Patient contacted the office to request a medication refill.   1. Name of Medication: Prednisone   2. How are you currently taking this medication (dosage and times per day)? 20 MG   3. What pharmacy would you like for that to be sent to? [facility] [address]   Pt has some questions about the shots she received today and medication. Pt would like a call back

## 2024-07-17 NOTE — Telephone Encounter (Signed)
 Please check on status of rheum referral   Ok to refill prednisone  for now.

## 2024-07-17 NOTE — Telephone Encounter (Signed)
 I called patient, referral faxed x 2, patient will call to schedule appt, Prednisone  RX in [place] pending for approval.

## 2024-07-18 ENCOUNTER — Inpatient Hospital Stay: Attending: Oncology

## 2024-07-18 DIAGNOSIS — Z90710 Acquired absence of both cervix and uterus: Secondary | ICD-10-CM | POA: Insufficient documentation

## 2024-07-18 DIAGNOSIS — M0600 Rheumatoid arthritis without rheumatoid factor, unspecified site: Secondary | ICD-10-CM | POA: Insufficient documentation

## 2024-07-18 DIAGNOSIS — D472 Monoclonal gammopathy: Secondary | ICD-10-CM | POA: Diagnosis not present

## 2024-07-18 DIAGNOSIS — Z8543 Personal history of malignant neoplasm of ovary: Secondary | ICD-10-CM | POA: Insufficient documentation

## 2024-07-18 DIAGNOSIS — Z9221 Personal history of antineoplastic chemotherapy: Secondary | ICD-10-CM | POA: Diagnosis not present

## 2024-07-18 DIAGNOSIS — D509 Iron deficiency anemia, unspecified: Secondary | ICD-10-CM | POA: Diagnosis not present

## 2024-07-18 DIAGNOSIS — Z90722 Acquired absence of ovaries, bilateral: Secondary | ICD-10-CM | POA: Insufficient documentation

## 2024-07-18 DIAGNOSIS — D869 Sarcoidosis, unspecified: Secondary | ICD-10-CM | POA: Insufficient documentation

## 2024-07-18 LAB — CMP (CANCER CENTER ONLY)
ALT: 7 U/L (ref 0–44)
AST: 19 U/L (ref 15–41)
Albumin: 3.7 g/dL (ref 3.5–5.0)
Alkaline Phosphatase: 77 U/L (ref 38–126)
Anion gap: 11 (ref 5–15)
BUN: 15 mg/dL (ref 8–23)
CO2: 26 mmol/L (ref 22–32)
Calcium: 9.1 mg/dL (ref 8.9–10.3)
Chloride: 105 mmol/L (ref 98–111)
Creatinine: 0.69 mg/dL (ref 0.44–1.00)
GFR, Estimated: >60 mL/min (ref >60)
Glucose, Bld: 123 mg/dL — ABNORMAL HIGH (ref 70–99)
Potassium: 3.8 mmol/L (ref 3.5–5.1)
Sodium: 141 mmol/L (ref 135–145)
Total Bilirubin: 0.3 mg/dL (ref 0.0–1.2)
Total Protein: 6.2 g/dL — ABNORMAL LOW (ref 6.5–8.1)

## 2024-07-18 LAB — CBC WITH DIFFERENTIAL (CANCER CENTER ONLY)
Abs Immature Granulocytes: 0.13 K/uL — ABNORMAL HIGH (ref 0.00–0.07)
Basophils Absolute: 0 K/uL (ref 0.0–0.1)
Basophils Relative: 0 %
Eosinophils Absolute: 0 K/uL (ref 0.0–0.5)
Eosinophils Relative: 0 %
HCT: 35.8 % — ABNORMAL LOW (ref 36.0–46.0)
Hemoglobin: 11.2 g/dL — ABNORMAL LOW (ref 12.0–15.0)
Immature Granulocytes: 1 %
Lymphocytes Relative: 10 %
Lymphs Abs: 1.3 K/uL (ref 0.7–4.0)
MCH: 29.3 pg (ref 26.0–34.0)
MCHC: 31.3 g/dL (ref 30.0–36.0)
MCV: 93.7 fL (ref 80.0–100.0)
Monocytes Absolute: 0.7 K/uL (ref 0.1–1.0)
Monocytes Relative: 5 %
Neutro Abs: 11.5 K/uL — ABNORMAL HIGH (ref 1.7–7.7)
Neutrophils Relative %: 84 %
Platelet Count: 301 K/uL (ref 150–400)
RBC: 3.82 MIL/uL — ABNORMAL LOW (ref 3.87–5.11)
RDW: 17.8 % — ABNORMAL HIGH (ref 11.5–15.5)
WBC Count: 13.6 K/uL — ABNORMAL HIGH (ref 4.0–10.5)
nRBC: 0 % (ref 0.0–0.2)

## 2024-07-18 LAB — FERRITIN: Ferritin: 157 ng/mL (ref 11–307)

## 2024-07-18 LAB — IRON AND TIBC
Iron: 23 ug/dL — ABNORMAL LOW (ref 28–170)
Saturation Ratios: 10 % — ABNORMAL LOW (ref 10.4–31.8)
TIBC: 237 ug/dL — ABNORMAL LOW (ref 250–450)
UIBC: 214 ug/dL

## 2024-07-18 LAB — C-REACTIVE PROTEIN: CRP: 5 mg/dL — ABNORMAL HIGH (ref <1.0)

## 2024-07-18 LAB — SEDIMENTATION RATE: Sed Rate: 11 mm/h (ref 0–22)

## 2024-07-18 NOTE — Telephone Encounter (Signed)
 Spoke with patient and advised Waddell reviewed Dr. Micheline note from 07/14/24--Taylor does not see that he recommended increasing the dose of prednisone  to 20 mg daily.  Recommended continuing hydrocortisone but there is no mention of increasing prednisone  dose back to 20 mg daily. Patient states that is what Dr. Faythe advised her to do and she will reach out to his office. I explained to patient we are unable to increase the dose as there is no documentation that is what has been recommended.

## 2024-07-19 DIAGNOSIS — R591 Generalized enlarged lymph nodes: Secondary | ICD-10-CM | POA: Diagnosis not present

## 2024-07-19 LAB — KAPPA/LAMBDA LIGHT CHAINS
Kappa free light chain: 15.9 mg/L (ref 3.3–19.4)
Kappa, lambda light chain ratio: 0.56 (ref 0.26–1.65)
Lambda free light chains: 28.6 mg/L — ABNORMAL HIGH (ref 5.7–26.3)

## 2024-07-21 ENCOUNTER — Telehealth: Payer: Self-pay | Admitting: Rheumatology

## 2024-07-21 LAB — MULTIPLE MYELOMA PANEL, SERUM
Albumin SerPl Elph-Mcnc: 3 g/dL (ref 2.9–4.4)
Albumin/Glob SerPl: 1.2 (ref 0.7–1.7)
Alpha 1: 0.4 g/dL (ref 0.0–0.4)
Alpha2 Glob SerPl Elph-Mcnc: 0.8 g/dL (ref 0.4–1.0)
B-Globulin SerPl Elph-Mcnc: 1.1 g/dL (ref 0.7–1.3)
Gamma Glob SerPl Elph-Mcnc: 0.4 g/dL (ref 0.4–1.8)
Globulin, Total: 2.7 g/dL (ref 2.2–3.9)
IgA: 97 mg/dL (ref 64–422)
IgG (Immunoglobin G), Serum: 679 mg/dL (ref 586–1602)
IgM (Immunoglobulin M), Srm: 53 mg/dL (ref 26–217)
Total Protein ELP: 5.7 g/dL — ABNORMAL LOW (ref 6.0–8.5)

## 2024-07-21 NOTE — Telephone Encounter (Signed)
 Patient called stating she just got off the phone with the referral department at [facility] and was told the only information they received from our office was clinical notes stating that she needed to return in 3 months.  They have not received a referral for a second opinion.  Patient was also told that the earliest appointment available is in February.  Patient requested a return call.

## 2024-07-21 NOTE — Telephone Encounter (Signed)
 Referral refaxed to [phone], patient requested referral faxed to [facility] for an earlier appt, referral faxed, pending appt.

## 2024-07-25 ENCOUNTER — Telehealth: Payer: Self-pay | Admitting: Rheumatology

## 2024-07-25 NOTE — Telephone Encounter (Signed)
 [facility] called stating they received a referral and they did not seem like this referral was for them. They stated it looked like the referral was meant to go to [facility].

## 2024-07-25 NOTE — Telephone Encounter (Signed)
 I called patient, referral received, patient will call to schedule appt, office left message x 2 to schedule appt,   Referred To  ZIOLKOWSKA, ALDONA

## 2024-07-30 ENCOUNTER — Other Ambulatory Visit: Payer: Self-pay | Admitting: Rheumatology

## 2024-08-01 ENCOUNTER — Inpatient Hospital Stay: Admitting: Oncology

## 2024-08-01 ENCOUNTER — Encounter: Payer: Self-pay | Admitting: Oncology

## 2024-08-01 VITALS — BP 144/64 | HR 82 | Temp 97.6°F | Resp 21 | Ht 64.5 in | Wt 184.1 lb

## 2024-08-01 DIAGNOSIS — D869 Sarcoidosis, unspecified: Secondary | ICD-10-CM | POA: Diagnosis not present

## 2024-08-01 DIAGNOSIS — Z8543 Personal history of malignant neoplasm of ovary: Secondary | ICD-10-CM | POA: Diagnosis not present

## 2024-08-01 DIAGNOSIS — D472 Monoclonal gammopathy: Secondary | ICD-10-CM | POA: Diagnosis not present

## 2024-08-01 DIAGNOSIS — M0600 Rheumatoid arthritis without rheumatoid factor, unspecified site: Secondary | ICD-10-CM

## 2024-08-01 NOTE — Assessment & Plan Note (Addendum)
 Faint IgG lambda detected on serum immunoelectrophoresis on routine blood work done at her rheumatologist office.   -Discussed diagnosis, implications, plan of care. 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    -Discussed small risk of progression to smoldering myeloma or actual multiple myeloma and hence continued monitoring is recommended.    -To complete workup, on her initial consultation with us  on 08/03/2023, we obtained serum free light chains, repeat SPEP and obtain 24-hour urine protein electrophoresis and immunoelectrophoresis.  SPEP showed no evidence of M spike.  IFE showed faint IgG band.  Serum free kappa and lambda were close to normal with normal ratio.  24-hour urine protein electrophoresis also came back unremarkable.  No CRAB features.   -Myeloma labs on 01/20/2024 showed no evidence of M spike on SPE. IFE showed IgG lambda monoclonal protein, faint amount.  Quantitative immunoglobulins were within normal limits.  Serum free kappa was 14.9 mg/L, serum free lambda 21.4 mg/L, ratio normal at 0.70.  Creatinine normal at 0.89, calcium  normal at 9.3.  Hemoglobin 12.8.  - Recent myeloma labs from 07/18/2024 showed small amount of M protein, too small to quantify.  IFE showed it to be IgG and lambda type.  Serum free kappa normal.  Serum free lambda slightly increased at 28.6 mg/L.  Ratio normal at 0.56.  Mild leukocytosis from prednisone  use noted.  Hemoglobin close to her baseline at 11.3.  Creatinine and calcium  are normal.  -Clinical picture remains consistent with MGUS.  Patient was provided reassurance.  Will continue surveillance.    Since labs remained stable overall, we will space out clinic intervals.  RTC in 12 months with labs 2 weeks prior.

## 2024-08-01 NOTE — Assessment & Plan Note (Addendum)
 Persistent systemic symptoms including fevers, body pain, and sore throat since July. Symptoms improve with increased steroid dose but recur upon tapering. Current treatment includes methotrexate  and Enbrel . 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 Awaiting second opinion for rheumatoid arthritis in February. Inflammation and sarcoidosis may contribute to symptoms.

## 2024-08-01 NOTE — Assessment & Plan Note (Signed)
 Sarcoidosis appears well-managed with no active disease. Recent CT chest, abdomen and pelvis on 07/19/2024 shows stable changes and improved lymph node in the right underarm area. No current concerns for cancer.

## 2024-08-01 NOTE — Progress Notes (Signed)
 [facility]  HEMATOLOGY CLINIC PROGRESS NOTE  PATIENT NAME: Julia Solis   MR#: [id number] DOB: 05-30-1951  Patient Care Team: Perri Ronal PARAS, MD as PCP - General (Internal Medicine) Mealor, Eulas BRAVO, MD as Consulting Physician (Cardiology) Dolphus Reiter, MD as Consulting Physician (Rheumatology)  Date of visit: 08/01/2024   ASSESSMENT & PLAN:   Julia Solis is a 73 y.o. lady with a past medical history of sarcoidosis, pulmonary hypertension, sick sinus syndrome, hypertension, osteoporosis, renal stones, history of stage I ovarian cancer diagnosed in 2011, in remission, degenerative joint disease, was referred to our service in October 2024 for evaluation of monoclonal gammopathy.  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  Monoclonal gammopathy of unknown significance Faint IgG lambda detected on serum immunoelectrophoresis on routine blood work done at her rheumatologist office.   -Discussed diagnosis, implications, plan of care. 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    -Discussed small risk of progression to smoldering myeloma or actual multiple myeloma and hence continued monitoring is recommended.    -To complete workup, on her initial consultation with us  on 08/03/2023, we obtained serum free light chains, repeat SPEP and obtain 24-hour urine protein electrophoresis and immunoelectrophoresis.  SPEP showed no evidence of M spike.  IFE showed faint IgG band.  Serum free kappa and lambda were close to normal with normal ratio.  24-hour urine protein electrophoresis also came back unremarkable.  No CRAB features.   -Myeloma labs on 01/20/2024 showed no evidence of M spike on SPE. IFE showed IgG lambda monoclonal protein, faint amount.  Quantitative immunoglobulins were within normal limits.  Serum free  kappa was 14.9 mg/L, serum free lambda 21.4 mg/L, ratio normal at 0.70.  Creatinine normal at 0.89, calcium  normal at 9.3.  Hemoglobin 12.8.  - Recent myeloma labs from 07/18/2024 showed small amount of M protein, too small to quantify.  IFE showed it to be IgG and lambda type.  Serum free kappa normal.  Serum free lambda slightly increased at 28.6 mg/L.  Ratio normal at 0.56.  Mild leukocytosis from prednisone  use noted.  Hemoglobin close to her baseline at 11.3.  Creatinine and calcium  are normal.  -Clinical picture remains consistent with MGUS.  Patient was provided reassurance.  Will continue surveillance.    Since labs remained stable overall, we will space out clinic intervals.  RTC in 12 months with labs 2 weeks prior.  History of ovarian cancer - Stage 1 disease, diagnosed in early 2011. S/p optimal surgical debulking (TAH/BSO, omentectomy, pelvic and periaortic LND) in March 2011 and 6 cycles of carbo/taxol in July 2011. She was undergoing surveillance since that time with no evidence of recurrence.  - Last seen by her oncologist at outside facility in January 2021.  CA-125 was 11, normal.  She was discharged from their office. -Clinically no signs or symptoms to suggest disease recurrence.  Routine imaging is not indicated as per NCCN guidelines.    - She gets annual CT of the chest for sarcoidosis monitoring at her pulmonologist office.  Recent CT chest, abdomen and pelvis on 07/19/2024 showed no concerning findings.  Seronegative rheumatoid arthritis (HCC) Persistent systemic symptoms including fevers, body pain, and sore throat since July. Symptoms improve with increased steroid dose but recur upon tapering. Current treatment includes methotrexate  and Enbrel . Endocrinologist suggests possible Cushing's syndrome due to steroid use but does  not fully explain symptoms. Awaiting second opinion for rheumatoid arthritis in February. Inflammation and sarcoidosis may contribute to  symptoms.  Sarcoidosis Sarcoidosis appears well-managed with no active disease. Recent CT chest, abdomen and pelvis on 07/19/2024 shows stable changes and improved lymph node in the right underarm area. No current concerns for cancer.    Iron deficiency anemia, mixed picture Iron deficiency anemia with mixed picture. Ferritin levels are normal, but iron saturation is slightly low at 10%. Iron binding capacity is low, indicating the body is not seeking more iron. Current regimen is two iron pills per day, causing reduced bowel movements. - Reduce iron supplementation to once daily. - Take iron with a multivitamin containing vitamin C to enhance absorption.  I spent a total of 27 minutes during this encounter with the patient including review of chart and various tests results, discussions about plan of care and coordination of care plan.  I reviewed lab results and outside records for this visit and discussed relevant results with the patient. Diagnosis, plan of care and treatment options were also discussed in detail with the patient. Opportunity provided to ask questions and answers provided to her apparent satisfaction. Provided instructions to call our clinic with any problems, questions or concerns prior to return visit. I recommended to continue follow-up with PCP and sub-specialists. She verbalized understanding and agreed with the plan. No barriers to learning was detected.  Chinita Patten, MD  08/01/2024 4:54 PM  [facility] - A DEPT OF [facility] [address] [place] [address] Dept: [phone] Dept Fax: [phone]   CHIEF COMPLAINT/ REASON FOR VISIT:  Follow-up for IgG lambda MGUS.  INTERVAL HISTORY:  Discussed the use of AI scribe software for clinical note transcription with the patient, who gave verbal consent to proceed.  History of Present Illness  Julia Solis is a 73 year old  female with seronegative rheumatoid arthritis and sarcoidosis who presents with persistent fevers and body pain.  She has been experiencing persistent fevers, body pain, and sore throat since July. These symptoms improve with oral steroid use but recur when the dosage is reduced. She is currently on hydrocortisone, having been switched from prednisone , but states she is not taking enough to alleviate her symptoms. Her fevers can reach up to 103.51F, and increasing the steroid dose is the only effective measure to reduce the fever. She is not currently taking hydroxychloroquine  but is on methotrexate  and Enbrel .  She has a history of elevated white blood cell count and C-reactive protein since July. She has stopped methotrexate  temporarily to manage the fever, but this did not help. She is concerned about the ongoing symptoms and the impact on her well-being.  She has a history of MGUS, with recent tests showing a very low and unmeasurable M protein level. Her kappa lambda ratio is normal, and her hemoglobin remains steady above 11. She also has a history of sarcoidosis, with stable lung changes and an improved lymph node in the right underarm area. A CT scan of the lungs showed no concerning changes.  She is currently taking iron supplements twice a day but wants to reduce the dose to once a day due to decreased bowel movements. Her iron saturation is slightly low at 10%, and her iron binding capacity is low, indicating a mixed picture of iron levels.   SUMMARY OF HEMATOLOGIC HISTORY:  73 y.o. lady with a past medical history of sarcoidosis, pulmonary hypertension, sick  sinus syndrome, hypertension, osteoporosis, renal stones, history of stage I ovarian cancer diagnosed in 2011, in remission, degenerative joint disease, was referred to our service for evaluation of monoclonal gammopathy.     Discussed the use of AI scribe software for clinical note transcription with the patient, who gave verbal  consent to proceed.    She was recently referred to a rheumatologist by her PCP.  Her rheumatologist pursued workup for suspected polymyalgia rheumatica (PMR) or rheumatoid arthritis (RA).  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  Immunoelectrophoresis showed faint IgG lambda monoclonal protein.  Given this finding, she was referred to us  for further evaluation.     To complete workup, on her initial consultation with us  on 08/03/2023, we obtained serum free light chains, repeat SPEP and obtain 24-hour urine protein electrophoresis and immunoelectrophoresis.  SPEP showed no evidence of M spike.  IFE showed faint IgG band.  Serum free kappa and lambda were close to normal with normal ratio.  24-hour urine protein electrophoresis also came back unremarkable.  No CRAB features.    Myeloma labs on 10/22/2023 showed 0.3 g/dL of M spike on SPEP, IFE showed it to be IgG lambda.  Quantitative immunoglobulins are within normal limits.  Serum free kappa was 24.9 mg/L, serum free lambda 25.8 mg/L, ratio normal at 0.97.  Repeated myeloma labs in April 2025 and in October 2025.  Overall stable M protein, sometimes too small to be measured.  Clinical picture remains consistent with MGUS.  Patient was provided reassurance.  Will continue surveillance.  Initially we rechecked labs in 3 months.  Since labs are stable overall, we will space her clinic intervals to every 6 to 12 months.  I have reviewed the past medical history, past surgical history, social history and family history with the patient and they are unchanged from previous note.  ALLERGIES: She is allergic to augmentin  [amoxicillin -pot clavulanate], flomax [tamsulosin hcl], latex, tramadol , and ciprofloxacin.  MEDICATIONS:  Current Outpatient Medications  Medication Sig Dispense Refill   alendronate  (FOSAMAX ) 70 MG tablet Take 1 tablet (70 mg total) by mouth once a week. Take with a full glass of water on an empty  stomach. 12 tablet 0   Cholecalciferol (VITAMIN D ) 50 MCG (2000 UT) tablet Take 2,000 Units by mouth daily.     etanercept  (ENBREL  MINI) 50 MG/ML injection Inject 1 mL (50 mg total) into the skin once a week. 12 mL 0   famotidine (PEPCID) 20 MG tablet Take 20 mg by mouth at bedtime.     ferrous sulfate 325 (65 FE) MG EC tablet Take 325 mg by mouth 2 (two) times daily.     fluticasone (FLONASE) 50 MCG/ACT nasal spray Place into the nose 3 (three) times a week.     folic acid  (FOLVITE ) 1 MG tablet Take 2 tablets (2 mg total) by mouth daily. 180 tablet 3   hydrocortisone (CORTEF) 10 MG tablet 3 tablets between 6AM to 8AM, 1 tablet around 2PM Orally Twice a day     loratadine (CLARITIN) 10 MG tablet Take 10 mg by mouth daily.     LORazepam  (ATIVAN ) 1 MG tablet TAKE 1 TABLET BY MOUTH EVERY DAY AT BEDTIME AS NEEDED 90 tablet 1   losartan  (COZAAR ) 100 MG tablet Take 1 tablet (100 mg total) by mouth daily. 90 tablet 1   methotrexate  50 MG/2ML injection Inject 0.8 mLs (20 mg total) into the skin once a week. 10 mL 0   Multiple Vitamin (  MULTIVITAMIN WITH MINERALS) TABS tablet Take 1 tablet by mouth daily.     oxyCODONE  (OXY IR/ROXICODONE ) 5 MG immediate release tablet one-quarter to one-half tablet as needed Orally up to once a day     Polyethyl Glycol-Propyl Glycol (SYSTANE) 0.4-0.3 % GEL ophthalmic gel Place 1 application  into both eyes in the morning and at bedtime.     rosuvastatin  (CRESTOR ) 5 MG tablet TAKE 1 TABLET BY MOUTH 3 TIMES A WEEK WITH SUPPER 36 tablet 2   TUBERCULIN SYR 1CC/27GX1/2 (B-D TB SYRINGE 1CC/27GX1/2) 27G X 1/2 1 ML MISC Inject 1 Syringe into the skin once a week. 12 each 3   No current facility-administered medications for this visit.     REVIEW OF SYSTEMS:    ROS  All other pertinent systems were reviewed with the patient and are negative.  PHYSICAL EXAMINATION:   Onc Performance Status - 08/01/24 1424       ECOG Perf Status   ECOG Perf Status Ambulatory and  capable of all selfcare but unable to carry out any work activities.  Up and about more than 50% of waking hours      KPS SCALE   KPS % SCORE Cares for self, unable to carry on normal activity or to do active work          Vitals:   08/01/24 1413  BP: (!) 144/64  Pulse: 82  Resp: (!) 21  Temp: 97.6 F (36.4 C)  SpO2: 100%   Filed Weights   08/01/24 1413  Weight: 184 lb 1.6 oz (83.5 kg)    Physical Exam Constitutional:      General: She is not in acute distress.    Appearance: Normal appearance.  HENT:     Head: Normocephalic and atraumatic.  Eyes:     General: No scleral icterus.    Conjunctiva/sclera: Conjunctivae normal.  Cardiovascular:     Rate and Rhythm: Normal rate and regular rhythm.     Heart sounds: Normal heart sounds.  Pulmonary:     Effort: Pulmonary effort is normal.     Breath sounds: Normal breath sounds.  Abdominal:     General: There is no distension.  Musculoskeletal:     Right lower leg: No edema.     Left lower leg: No edema.  Neurological:     General: No focal deficit present.     Mental Status: She is alert and oriented to person, place, and time.  Psychiatric:        Mood and Affect: Mood normal.        Behavior: Behavior normal.        Thought Content: Thought content normal.     LABORATORY DATA:   I have reviewed the data as listed.  Recent Results (from the past 2160 hours)  CUP PACEART REMOTE DEVICE CHECK     Status: None   Collection Time: 05/11/24  9:25 AM  Result Value Ref Range   Pulse Generator Manufacturer [employer]    Date Time Interrogation Session [id number]    Pulse Gen Model 2272 Assurity MRI    Pulse Gen Serial Number [id number]    Clinic Name [facility]    Implantable Pulse Generator Type Implantable Pulse Generator    Implantable Pulse Generator Implant Date 79779798    Implantable Lead Manufacturer [facility]    Implantable Lead Model LPA1200M Tendril MRI    Implantable Lead Serial Number [id number]     Implantable Lead Implant Date 79779798    Implantable Lead  Location Detail 1 UNKNOWN    Implantable Lead Location [id number]    Implantable Lead Connection Status [id number]    Implantable Lead Manufacturer [facility]    Implantable Lead Model LPA1200M Tendril MRI    Implantable Lead Serial Number [id number]    Implantable Lead Implant Date 79779798    Implantable Lead Location Detail 1 UNKNOWN    Implantable Lead Location [id number]    Implantable Lead Connection Status [id number]   CBC with Differential/Platelet     Status: Abnormal   Collection Time: 05/18/24 11:29 AM  Result Value Ref Range   WBC 14.2 (H) 3.8 - 10.8 Thousand/uL   RBC 3.82 3.80 - 5.10 Million/uL   Hemoglobin 10.8 (L) 11.7 - 15.5 g/dL   HCT 65.2 (L) 64.9 - 54.9 %   MCV 90.8 80.0 - 100.0 fL   MCH 28.3 27.0 - 33.0 pg   MCHC 31.1 (L) 32.0 - 36.0 g/dL    Comment: For adults, a slight decrease in the calculated MCHC value (in the range of 30 to 32 g/dL) is most likely not clinically significant; however, it should be interpreted with caution in correlation with other red cell parameters and the patient's clinical condition.    RDW 15.0 11.0 - 15.0 %   Platelets 290 140 - 400 Thousand/uL   MPV 9.8 7.5 - 12.5 fL   Neutro Abs 12,013 (H) 1,500 - 7,800 cells/uL   Absolute Lymphocytes 1,264 850 - 3,900 cells/uL   Absolute Monocytes 639 200 - 950 cells/uL   Eosinophils Absolute 256 15 - 500 cells/uL   Basophils Absolute 28 0 - 200 cells/uL   Neutrophils Relative % 84.6 %   Total Lymphocyte 8.9 %   Monocytes Relative 4.5 %   Eosinophils Relative 1.8 %   Basophils Relative 0.2 %  Comprehensive metabolic panel with GFR     Status: Abnormal   Collection Time: 05/25/24  3:06 PM  Result Value Ref Range   Glucose, Bld 101 (H) 65 - 99 mg/dL    Comment: .            Fasting reference interval . For someone without known diabetes, a glucose value between 100 and 125 mg/dL is consistent with prediabetes and should be confirmed with  a follow-up test. .    BUN 26 (H) 7 - 25 mg/dL   Creat 9.22 9.39 - 8.99 mg/dL   eGFR 82 > OR = 60 fO/fpw/8.26f7   BUN/Creatinine Ratio 34 (H) 6 - 22 (calc)   Sodium 138 135 - 146 mmol/L   Potassium 4.6 3.5 - 5.3 mmol/L   Chloride 103 98 - 110 mmol/L   CO2 28 20 - 32 mmol/L   Calcium  8.8 8.6 - 10.4 mg/dL   Total Protein 5.9 (L) 6.1 - 8.1 g/dL   Albumin 3.6 3.6 - 5.1 g/dL   Globulin 2.3 1.9 - 3.7 g/dL (calc)   AG Ratio 1.6 1.0 - 2.5 (calc)   Total Bilirubin 0.4 0.2 - 1.2 mg/dL   Alkaline phosphatase (APISO) 55 37 - 153 U/L   AST 15 10 - 35 U/L   ALT 12 6 - 29 U/L  QuantiFERON-TB Gold Plus     Status: None   Collection Time: 06/29/24 11:10 AM  Result Value Ref Range   QuantiFERON-TB Gold Plus NEGATIVE NEGATIVE    Comment: Negative test result. M. tuberculosis complex  infection unlikely.    NIL 0.01 IU/mL   Mitogen-NIL 0.97 IU/mL   TB1-NIL 0.00 IU/mL   TB2-NIL 0.00 IU/mL  Comment: . The Nil tube value reflects the background interferon gamma immune response of the patient's blood sample. This value has been subtracted from the patient's displayed TB and Mitogen results. . Lower than expected results with the Mitogen tube prevent false-negative Quantiferon readings by detecting a patient with a potential immune suppressive condition and/or suboptimal pre-analytical specimen handling. . The TB1 Antigen tube is coated with the M. tuberculosis-specific antigens designed to elicit responses from TB antigen primed CD4+ helper T-lymphocytes. . The TB2 Antigen tube is coated with the M. tuberculosis-specific antigens designed to elicit responses from TB antigen primed CD4+ helper and CD8+ cytotoxic T-lymphocytes. . For additional information, please refer to https://education.questdiagnostics.com/faq/FAQ204 (This link is being provided for informational/ educational purposes only.) .   Comprehensive metabolic panel with GFR     Status: Abnormal   Collection Time:  06/29/24 11:10 AM  Result Value Ref Range   Glucose, Bld 88 65 - 99 mg/dL    Comment: .            Fasting reference interval .    BUN 18 7 - 25 mg/dL   Creat 9.24 9.39 - 8.99 mg/dL   eGFR 85 > OR = 60 fO/fpw/8.26f7   BUN/Creatinine Ratio SEE NOTE: 6 - 22 (calc)    Comment:    Not Reported: BUN and Creatinine are within    reference range. .    Sodium 140 135 - 146 mmol/L   Potassium 4.1 3.5 - 5.3 mmol/L   Chloride 104 98 - 110 mmol/L   CO2 26 20 - 32 mmol/L   Calcium  8.9 8.6 - 10.4 mg/dL   Total Protein 5.7 (L) 6.1 - 8.1 g/dL   Albumin 3.6 3.6 - 5.1 g/dL   Globulin 2.1 1.9 - 3.7 g/dL (calc)   AG Ratio 1.7 1.0 - 2.5 (calc)   Total Bilirubin 0.6 0.2 - 1.2 mg/dL   Alkaline phosphatase (APISO) 54 37 - 153 U/L   AST 17 10 - 35 U/L   ALT 10 6 - 29 U/L  CBC with Differential/Platelet     Status: Abnormal   Collection Time: 06/29/24 11:10 AM  Result Value Ref Range   WBC 13.0 (H) 3.8 - 10.8 Thousand/uL   RBC 3.82 3.80 - 5.10 Million/uL   Hemoglobin 11.3 (L) 11.7 - 15.5 g/dL   HCT 64.9 64.9 - 54.9 %   MCV 91.6 80.0 - 100.0 fL   MCH 29.6 27.0 - 33.0 pg   MCHC 32.3 32.0 - 36.0 g/dL    Comment: For adults, a slight decrease in the calculated MCHC value (in the range of 30 to 32 g/dL) is most likely not clinically significant; however, it should be interpreted with caution in correlation with other red cell parameters and the patient's clinical condition.    RDW 16.4 (H) 11.0 - 15.0 %   Platelets 229 140 - 400 Thousand/uL   MPV 9.7 7.5 - 12.5 fL   Neutro Abs 11,492 (H) 1,500 - 7,800 cells/uL   Absolute Lymphocytes 962 850 - 3,900 cells/uL   Absolute Monocytes 403 200 - 950 cells/uL   Eosinophils Absolute 117 15 - 500 cells/uL   Basophils Absolute 26 0 - 200 cells/uL   Neutrophils Relative % 88.4 %   Total Lymphocyte 7.4 %   Monocytes Relative 3.1 %   Eosinophils Relative 0.9 %   Basophils Relative 0.2 %  Ferritin     Status: None   Collection Time: 07/18/24  2:57 PM   Result Value  Ref Range   Ferritin 157 11 - 307 ng/mL    Comment: Performed at [facility], [address], [place], [place] [address]  Iron and TIBC     Status: Abnormal   Collection Time: 07/18/24  2:58 PM  Result Value Ref Range   Iron 23 (L) 28 - 170 ug/dL   TIBC 762 (L) 749 - 549 ug/dL   Saturation Ratios 10 (L) 10.4 - 31.8 %   UIBC 214 ug/dL    Comment: Performed at [facility], [address] [place], [place] [address]  Sedimentation rate     Status: None   Collection Time: 07/18/24  3:04 PM  Result Value Ref Range   Sed Rate 11 0 - 22 mm/hr    Comment: Performed at [facility], [address], [place], [place] [address]  C-reactive protein     Status: Abnormal   Collection Time: 07/18/24  3:04 PM  Result Value Ref Range   CRP 5.0 (H) <1.0 mg/dL    Comment: Performed at [facility], [address], [place], [place] [address]  CMP (Cancer Center only)     Status: Abnormal   Collection Time: 07/18/24  3:04 PM  Result Value Ref Range   Sodium 141 135 - 145 mmol/L   Potassium 3.8 3.5 - 5.1 mmol/L   Chloride 105 98 - 111 mmol/L   CO2 26 22 - 32 mmol/L   Glucose, Bld 123 (H) 70 - 99 mg/dL    Comment: Glucose reference range applies only to samples taken after fasting for at least 8 hours.   BUN 15 8 - 23 mg/dL   Creatinine 9.30 9.55 - 1.00 mg/dL   Calcium  9.1 8.9 - 10.3 mg/dL   Total Protein 6.2 (L) 6.5 - 8.1 g/dL   Albumin 3.7 3.5 - 5.0 g/dL   AST 19 15 - 41 U/L   ALT 7 0 - 44 U/L   Alkaline Phosphatase 77 38 - 126 U/L   Total Bilirubin 0.3 0.0 - 1.2 mg/dL   GFR, Estimated >39 >39 mL/min    Comment: (NOTE) Calculated using the CKD-EPI Creatinine Equation (2021)    Anion gap 11 5 - 15    Comment: Performed at [facility], [address] [place], [place] [address]  CBC with Differential (Cancer Center Only)     Status: Abnormal   Collection Time: 07/18/24  3:04 PM  Result Value Ref Range    WBC Count 13.6 (H) 4.0 - 10.5 K/uL   RBC 3.82 (L) 3.87 - 5.11 MIL/uL   Hemoglobin 11.2 (L) 12.0 - 15.0 g/dL   HCT 64.1 (L) 63.9 - 53.9 %   MCV 93.7 80.0 - 100.0 fL   MCH 29.3 26.0 - 34.0 pg   MCHC 31.3 30.0 - 36.0 g/dL   RDW 82.1 (H) 88.4 - 84.4 %   Platelet Count 301 150 - 400 K/uL   nRBC 0.0 0.0 - 0.2 %   Neutrophils Relative % 84 %   Neutro Abs 11.5 (H) 1.7 - 7.7 K/uL   Lymphocytes Relative 10 %   Lymphs Abs 1.3 0.7 - 4.0 K/uL   Monocytes Relative 5 %   Monocytes Absolute 0.7 0.1 - 1.0 K/uL   Eosinophils Relative 0 %   Eosinophils Absolute 0.0 0.0 - 0.5 K/uL   Basophils Relative 0 %   Basophils Absolute 0.0 0.0 - 0.1 K/uL   Immature Granulocytes 1 %   Abs Immature Granulocytes 0.13 (  H) 0.00 - 0.07 K/uL    Comment: Performed at [facility], [address] [place], [place] [address]  Kappa/lambda light chains     Status: Abnormal   Collection Time: 07/18/24  3:04 PM  Result Value Ref Range   Kappa free light chain 15.9 3.3 - 19.4 mg/L   Lambda free light chains 28.6 (H) 5.7 - 26.3 mg/L   Kappa, lambda light chain ratio 0.56 0.26 - 1.65    Comment: (NOTE) Performed At: [facility] [address] [place], [place] [id number] Jennette Shorter MD Ey:[phone]   Multiple Myeloma Panel (SPEP&IFE w/QIG)     Status: Abnormal   Collection Time: 07/18/24  3:04 PM  Result Value Ref Range   IgG (Immunoglobin G), Serum 679 586 - 1,602 mg/dL   IgA 97 64 - 577 mg/dL   IgM (Immunoglobulin M), Srm 53 26 - 217 mg/dL   Total Protein ELP 5.7 (L) 6.0 - 8.5 g/dL   Albumin SerPl Elph-Mcnc 3.0 2.9 - 4.4 g/dL   Alpha 1 0.4 0.0 - 0.4 g/dL   Alpha2 Glob SerPl Elph-Mcnc 0.8 0.4 - 1.0 g/dL   B-Globulin SerPl Elph-Mcnc 1.1 0.7 - 1.3 g/dL   Gamma Glob SerPl Elph-Mcnc 0.4 0.4 - 1.8 g/dL   M Protein SerPl Elph-Mcnc Comment (A) Not Observed g/dL    Comment: (NOTE) Immunofixation detected an M-spike in the beta fraction of the Protein Electrophoresis; unable to quantify  due to the co-migration with other proteins.    Globulin, Total 2.7 2.2 - 3.9 g/dL   Albumin/Glob SerPl 1.2 0.7 - 1.7   IFE 1 Comment (A)     Comment: (NOTE) Immunofixation shows IgG monoclonal protein with lambda light chain specificity.    Please Note Comment     Comment: (NOTE) Protein electrophoresis scan will follow via computer, mail, or courier delivery. Performed At: [facility] [address] [place], [place] [id number] Jennette Shorter MD Ey:[id number]      RADIOGRAPHIC STUDIES:  No recent pertinent imaging studies available to review.  Orders Placed This Encounter  Procedures   CBC with Differential (Cancer Center Only)    Standing Status:   Future    Expiration Date:   08/01/2025   CMP (Cancer Center only)    Standing Status:   Future    Expiration Date:   08/01/2025   Lactate dehydrogenase    Standing Status:   Future    Expiration Date:   08/01/2025   Iron and TIBC    Standing Status:   Future    Expiration Date:   08/01/2025   Ferritin    Standing Status:   Future    Expiration Date:   08/01/2025   Multiple Myeloma Panel (SPEP&IFE w/QIG)    Standing Status:   Future    Expiration Date:   08/01/2025   Kappa/lambda light chains    Standing Status:   Future    Expiration Date:   08/01/2025     Future Appointments  Date Time Provider Department Center  08/10/2024  7:00 AM CVD HVT DEVICE REMOTES [facility] H&V  08/22/2024  9:30 AM [facility] [address]  08/24/2024 11:00 AM Perri Ronal PARAS, MD [facility] [address]  10/12/2024  2:10 PM Cheryl Waddell HERO, PA-C [facility] None  01/02/2025  1:10 PM Karis Clunes, MD [facility] None  07/17/2025  1:00 PM [facility] PHLEBOTOMIST [facility] None  07/31/2025 11:45 AM Dusti Tetro, Chinita, MD [facility] None     This document was completed utilizing speech recognition software.  Grammatical errors, random word insertions, pronoun errors, and incomplete sentences are an occasional consequence of this system due to software  limitations, ambient noise, and hardware issues. Any formal questions or concerns about the content, text or information contained within the body of this dictation should be directly addressed to the provider for clarification.

## 2024-08-01 NOTE — Assessment & Plan Note (Addendum)
-   Stage 1 disease, diagnosed in early 2011. S/p optimal surgical debulking (TAH/BSO, omentectomy, pelvic and periaortic LND) in March 2011 and 6 cycles of carbo/taxol in July 2011. She was undergoing surveillance since that time with no evidence of recurrence.  - Last seen by her oncologist at outside facility in January 2021.  CA-125 was 11, normal.  She was discharged from their office. -Clinically no signs or symptoms to suggest disease recurrence.  Routine imaging is not indicated as per NCCN guidelines.    - She gets annual CT of the chest for sarcoidosis monitoring at her pulmonologist office.  Recent CT chest, abdomen and pelvis on 07/19/2024 showed no concerning findings.

## 2024-08-02 ENCOUNTER — Telehealth (INDEPENDENT_AMBULATORY_CARE_PROVIDER_SITE_OTHER): Admitting: Internal Medicine

## 2024-08-02 ENCOUNTER — Telehealth: Payer: Self-pay | Admitting: Internal Medicine

## 2024-08-02 ENCOUNTER — Encounter: Payer: Self-pay | Admitting: Internal Medicine

## 2024-08-02 VITALS — Temp 99.4°F | Ht 64.5 in | Wt 184.0 lb

## 2024-08-02 DIAGNOSIS — Z862 Personal history of diseases of the blood and blood-forming organs and certain disorders involving the immune mechanism: Secondary | ICD-10-CM

## 2024-08-02 DIAGNOSIS — R059 Cough, unspecified: Secondary | ICD-10-CM

## 2024-08-02 DIAGNOSIS — M810 Age-related osteoporosis without current pathological fracture: Secondary | ICD-10-CM

## 2024-08-02 DIAGNOSIS — D860 Sarcoidosis of lung: Secondary | ICD-10-CM | POA: Diagnosis not present

## 2024-08-02 DIAGNOSIS — R509 Fever, unspecified: Secondary | ICD-10-CM

## 2024-08-02 DIAGNOSIS — Z950 Presence of cardiac pacemaker: Secondary | ICD-10-CM | POA: Diagnosis not present

## 2024-08-02 MED ORDER — HYDROCODONE BIT-HOMATROP MBR 5-1.5 MG/5ML PO SOLN: 5.0000 mL | Freq: Three times a day (TID) | ORAL | 0 refills | Status: DC | PRN

## 2024-08-02 NOTE — Progress Notes (Addendum)
 [facility] Pulmonary Medicine Outpatient Clinic   Primary Care Physician: Baxley, Ronal Rush, MD Referring Provider: Perri Ronal Rush, MD   History of Present Illness:  73 y.o. yo female presents to Pulmonary Clinic for f/u regarding SARCOID. She was worked in for new DOE.  ADDRESSED THIS VISIT: SARCOID DOE  Background: She has early stage ovarian cancer resected in 2011 with LNs with non-necrotizing granulomatous inflammation. She has known since the 1990s that she's had abnormal CXR. She had films from 2011 and 2012 with adenopathy and consolidated areas on scan. Abd scan 05/2018 for renal stones noted enlarging splenic lesions (compared to 2012). Dec 2019 abd imaging suggested 2mm increase in one of the splenic lesions. The only new areas from this time are her splenic lesions. She then got a PET scan 2020 which showed diffuse uptake in lymph nodes, spleen, lung, and bones-->labeled as widely metastatic disease. She had a bone biopsy which showed granulomatous inflammation. Given multiple biopsies with granulomas, we treated with a course of steroids an monitored with no change in the appearance of her lymph nodes, spleen, lung, and bones. PFTs done in March 2020 did not show evidence of obstructive disease process. Follow-up imaging has shown stable lesions  New DX RA this year  INFECTIONS COVID 11/2022 - took paxlovid ; 4 respiratory infections to follow  Flu 2025  History of Present Illness The patient is a 73 year old female who presents for follow-up of sarcoidosis.  She reports experiencing severe illness, including constant fevers since 04/08/2024. The fevers can reach up to 103.5 degrees, typically peaking in the middle of the night. She also reports significant body pain and a sore throat. An ENT specialist evaluated her sore throat during a sinus check and found no signs of infection, suggesting inflammation as a possible cause. She has had an elevated white blood  cell count since mid-July, which her hematologist believes is due to either the steroids or inflammation, not an infectious process.  She has consulted with an endocrinologist, Dr. Reyes Alexander, in an attempt to discontinue prednisone . She is currently on hydrocortisone tablets, equivalent to 10 mg of prednisone . The endocrinologist suspects an adverse reaction to the steroid and Cushing's syndrome due to the steroid but does not believe this explains all her symptoms. He recommended a higher dose of steroid, which she found beneficial at 20 mg but not at 10 mg.   Her rheumatologist suggested a second opinion, which she has scheduled for 11/2024. She reports a low quality of life, often being bedridden for up to 2 hours due to pain. She is considering increasing her hydrocortisone dose or adding 5 mg of prednisone  in the morning. She experienced 3 weeks of relief while on 20 mg of prednisone  for 2 weeks, but her symptoms returned when the dose was reduced to 15 mg after starting Enbrel . She reports improvement in her hands but continues to experience significant pain in her arms, shoulders, shoulder blades, and ribs, primarily in her upper body.  She is currently on methotrexate  injections and Enbrel , having taken her 10th dose this morning. She was previously on meloxicam  and Tylenol  while taking hydroxychloroquine  but had to discontinue these medications when she started methotrexate . She experienced significant pain, leading to the initiation of steroids, which she was advised to taper off.  She has been unwell for the past few days with a severe cold but is getting better. She has been taking Mucinex twice daily but discontinued it yesterday due to excessive mucus production.  She has requested a refill of her cough medicine from her primary care physician.   She is under the care of a cardiologist for her pacemaker, with check-ups scheduled every 2 years.    Cancer History Ovarian Cancer 2011 S/p  resection and chemotherapy  Tobacco use: smoked cigarettes from around 1967 to 1978   Review of Systems:  GEN: no fevers/chills/weight loss; weight gain HEENT: no blurry vision, no rhinorrhea, no epistaxis, no sore throat/hoarseness RESP: see HPI CV: no chest pain, murmurs or palpitations; no orthopnea; no PND; no edema GI: no n/v/d/c GU: no dysuria, no hematuria, no urinary frequency  MUSC: back pain, chronic; hand pain and swalling SKIN: no rash HEME: no LAD SLEEP: reports insomnia  Past History:   Medical History[1] RA  Sarcoid  Medications:   Current Outpatient Medications  Medication Instructions  . alendronate  (FOSAMAX ) 70 mg, Weekly  . BD Tuberculin Syringe 1 mL 27 x 1/2 syrg INJECT 1 SYRINGE INTO THE SKIN ONCE A WEEK.  . cholecalciferol (VITAMIN D3) 4,000 Units, Daily  . etanercept  (EnbreL ) 50 mg/mL (1 mL) syrg injection 1 mL  . famotidine (PEPCID) 20 mg, Daily  . ferrous sulfate 325 mg, 2 times daily  . fluticasone propionate (FLONASE) 50 mcg/spray nasal spray 1 spray, 3 times weekly  . folic acid  (FOLVITE ) 1 mg, Daily  . hydrocortisone (CORTEF) 10 mg tablet 3 TABLETS BETWEEN 6AM TO 8AM, 1 TABLET AROUND 2PM ORALLY TWICE A DAY  . loratadine (CLARITIN) 10 mg, Daily  . LORazepam  (ATIVAN ) 1 mg tablet 1 tablet, As needed  . losartan  (COZAAR ) 25 mg, Daily  . methotrexate  2 mg/mL soln   . peg 400-propylene glycol (Systane, propylene glycoL,) 0.4-0.3 % drop ophthalmic solution 1 drop, 2 times daily  . predniSONE  (DELTASONE ) 5 mg, oral, Daily  . rosuvastatin  (CRESTOR ) 5 mg, Every Mon, Wed & Fri     Physical Exam:   Vitals:   08/02/24 1104  BP: 147/79  Pulse:   Temp:   SpO2:     General appearance: alert, appears stated age and cooperative Head: Normocephalic, without obvious abnormality, atraumatic Neck: no adenopathy and no JVD Lungs:clear to auscultation bilaterally, normal percussion bilaterally. Heart: regular rate and rhythm Abdomen: soft,  nontender Extremities: extremities with no erythema, atraumatic, no cyanosis; hand swelling (diffuse) Pulses: 2+ and symmetric Skin: Skin color, texture, turgor normal. No rashes or lesions   Diagnostic Review:  I personally reviewed the following: PET 10/27/18 CONCLUSION: 1.  Findings showing diffuse metastatic disease within the chest includes the lungs, pleura, and pericardium. Additional extensive thoracic nodal adenopathy as above. 2.  Multiple hypermetabolic splenic lesions compatible with metastatic disease. 3.  Multifocal abdominopelvic metastatic adenopathy including within multiple gastrohepatic, para-aortic, and bilateral iliac chain lymph nodes. 4.  Widespread osseous metastatic disease as described above.  5.  Ancillary CT findings as above.     05/2018 CT Abd Comparison 2012 Enlarging abdominal lesions in spleen. Stable abdominal lymphadenopathy except for one which when from 4mm to 7mm   CT CAP 10/2019 CONCLUSION: 1.  Chronic pulmonary findings with associated prominent supraclavicular, mediastinal, hilar, retroperitoneal, and pelvic lymphadenopathy are not significantly changed dating back to 05/23/2010 in keeping with reported history of sarcoidosis. 2.  Intermediate density lesions along the spleen are unchanged since 2019. 3.  Numerous thyroid  nodules are similar to prior can be further evaluated with nonemergent thyroid  ultrasound if not already performed.  07/10/21 CONCLUSION: 1. Redemonstrated pulmonary changes of sarcoidosis, with upper lobe predominant fibrosis, architectural distortion, and  traction bronchiectasis. No new or enlarging pulmonary nodules. 2. Multiple subcentimeter short axis axillary, mediastinal, retroperitoneal, mesenteric, inguinal, and iliac nodes are unchanged. 3. Indeterminate splenic lesions, the largest measuring 2.9 x 3 cm, unchanged. 4. Interval clearance of left UPJ stone, with some residual left hydronephrosis and uroepithelial  thickening.2  06/2022 CHEST: .  Thoracic inlet/axilla: Similar supraclavicular subcentimeter lymph nodes. .  Central airways: Within normal limits. .  Mediastinum/hila: Similar enlarged right paratracheal node. Additional subcentimeter nodes are unchanged. SABRA  Heart/vessel: Right subclavian approach cardiac pacemaker leads terminate in the right atrium and right ventricle. Normal heart size. No pericardial effusion. Mild burden of coronary artery calcification. Trace aortic valve calcification. Aorta normal in caliber and appearance. No pulmonary embolism.  .  Lungs/pleura: Grossly similar architectural distortion, traction bronchiectasis, and fibrosis with upper lobe predominance. Pulmonary nodules are unchanged. No new suspicious nodules. No effusion or pneumothorax.  CT C/A/P 06/16/23 FINDINGS:   . Thoracic inlet: Similar multinodular thyroid . Patent airway. . Chest wall/Axilla: Interval increase in size of bilateral axillary lymph nodes, the largest being on the right measuring 1.1 x 2.6 cm, demonstrating a preserved fatty hilum (2/17). Left upper anterior chest wall cardiac rhythm maintenance device generator. . Central airways: Patent. . Mediastinum/Hila: Unremarkable. SABRA Heart: Heart size within normal limits. No pericardial effusion. Cardiac rhythm maintenance device leads terminate in the right atrium and right ventricle. There is mild calcification of the aortic root. . Vessels: Aorta normal in caliber. No central pulmonary embolism. . Lungs/Pleura: Similar upper lobe predominant peribronchial vascular scarring/fibrosis in keeping with reported clinical history of sarcoidosis. No new pulmonary parenchymal disease is identified.   . Liver: No suspicious focal findings. . Gallbladder/Biliary: Unremarkable. SABRA Spleen: Previously seen hypodense lesions within the spleen are decreased in conspicuity on today's examination, possibly owing to contrast phase differences versus decrease in  size. SABRA Pancreas: Unremarkable. . Adrenals: Unremarkable. . Kidneys: Unremarkable.   . Peritoneum/Mesenteries/Extraperitoneum: No free air. Similar rounded fluid containing lesion superior to the proximal stomach, subjacent to the diaphragm (series 4 image 40), etiology unclear but likely benign. Similar fat-containing left inguinal hernia. No pathologically enlarged lymph nodes. . Gastrointestinal tract: No evidence of obstruction. Appendectomy.   . Ureters: Unremarkable. . Bladder: Unremarkable. . Reproductive System: Unremarkable.   . Vascular: Unremarkable. . Musculoskeletal: No acute displaced fractures. Polyarticular degenerative changes. No aggressive focal bony lesions. Abdominal wall soft tissues unremarkable.   IMPRESSION: 1.  Stable pulmonary changes in keeping with sequela of sarcoidosis. No new pulmonary parenchymal disease or progressive mediastinal/hilar lymphadenopathy. 2.  Increase in size of bilateral axillary lymph nodes, possibly reactive. Clinical follow-up is warranted. Consider imaging follow-up as clinically indicated. 3.  Decrease in conspicuity of previously seen hypodense splenic lesions, possibly owing to differences in examination technique versus decreased size of these lesions. Continued attention on follow-up. 4.  Stable additional ancillary findings, as above.    CT 07/2024 IMPRESSION: 1.  Stable pulmonary changes in keeping with sequelae of sarcoidosis. No new parenchymal disease. 2. Decrease in size of right axillary lymph node. No substantial interval change is seen additional bilateral prominent axillary lymph nodes.  11/07/2018 A.  Left ilium, Fine Needle Aspiration II (smears and cell block):           Granulomatous inflammation.      Cells diagnostic of malignancy are not seen.       See comment.      Specimen Adequacy:  Satisfactory for evaluation. B.  Cytology core biopsy:  Granulomatous inflammation.  2011 R pelvic  LN Non-necrotizing granuloma  08/2020 TTE EF 60-65% LV function normal LV Grade 1 diastolic dysfunction RV function and size normal RVSP 34 LA mildly dilated   TTE 2023 [facility] IMPRESSIONS   1. Left ventricular ejection fraction, by estimation, is 60 to 65%. The left ventricle has normal function. The left ventricle has no regional wall motion abnormalities. Left ventricular diastolic parameters were normal. The average left ventricular global longitudinal strain is -26.1 %. The global longitudinal strain is normal.   2. Right ventricular systolic function is normal. The right ventricular size is normal. There is mildly elevated pulmonary artery systolic pressure. 44mmHg   3. Left atrial size was mildly dilated.   4. The mitral valve is normal in structure. Trivial mitral valve regurgitation. No evidence of mitral stenosis.   5. The aortic valve is tricuspid. Aortic valve regurgitation is not visualized. No aortic stenosis is present.   6. The inferior vena cava is normal in size with greater than 50% respiratory variability, suggesting right atrial pressure of 3 mmHg.     TTE 2024 Comparison(s): 09/26/20 EF 0-65%. PA pressure .   1. Left ventricular ejection fraction, by estimation, is 60 to 65%. Left ventricular ejection fraction by 3D volume is 60 %. The left ventricle has normal function. The left ventricle has no regional wall motion abnormalities. Left ventricular diastolic   parameters are consistent with Grade I diastolic dysfunction (impaired relaxation). The average left ventricular global longitudinal strain is -17.5 %. The global longitudinal strain is mildly abnormal.   2. Right ventricular systolic function is normal. The right ventricular size is normal. There is normal pulmonary artery systolic pressure. The estimated right ventricular systolic pressure is 30.0 mmHg.   3. The mitral valve is grossly normal. Trivial mitral valve regurgitation.   4. The aortic valve is  tricuspid. Aortic valve regurgitation is not visualized.   5. The inferior vena cava is normal in size with greater than 50% respiratory variability, suggesting right atrial pressure of 3 mmHg.   Comparison(s): Changes from prior study are noted. 06/22/2022: LVEF 60-65%, GLS -26.1%.   FINDINGS   Left Ventricle: Left ventricular ejection fraction, by estimation, is 60 to 65%. Left ventricular ejection fraction by 3D volume is 60 %. The left ventricle has normal function. The left ventricle has no regional wall motion abnormalities. The average  left ventricular global longitudinal strain is -17.5 %. The global longitudinal strain is abnormal. The left ventricular internal cavity size was normal in size. There is no left ventricular hypertrophy. Abnormal (paradoxical) septal motion, consistent  with RV pacemaker. Left ventricular diastolic parameters are consistent with Grade I diastolic dysfunction (impaired relaxation). Indeterminate filling pressures.   PFTs Pre-Bronchodilator Spirometry: PFT Data FVC FVC % pred FVC LLN FEV1 FEV1 % pred FEV1 LLN FEV1/VC  Latest Ref Rng & Units L % L L % L %  12/12/2018 3.04 98.4 2.28 2.39 99.4 1.78 79  09/02/2020 2.85 97.8 2.14 2.23 98.1 1.66 78  06/30/2022 2.83 108.1 1.95 2.27 110.4 1.49 80    PFT Data (Continued) FEV1/VC LLN  Latest Ref Rng & Units %  12/12/2018 66  09/02/2020 65  06/30/2022 67   Post-Bronchodilator Spirometry: PFT Data FEV1/VC LLN  Latest Ref Rng & Units %  12/12/2018 66  09/02/2020 65  06/30/2022 67   Total Lung Capacity and Diffusing Capacity: PFT Data TLC (pleth) TLC (pleth) %pred TLC (pleth) LLN DLCO DLCO % pred DLCO LLN DL/VA % pred  Latest Ref Rng & Units L % L ml/min/mmHg % ml/min/mmHg %  12/12/2018 4.69 90.0 4.13 16.80 82.2 15.45 91.4  09/02/2020 4.59 90.9 3.98 15.66 79.8 14.80 90.1  06/30/2022 4.49 90.0 3.95 15.59 82.1 14.29 91.7     Assessment/Plan:    Assessment & Plan 1. Sarcoidosis. Her condition does not appear to be  an active flare-up of sarcoidosis. Imaging studies from 2020, 2023, and 2025 were compared, showing no progression of the disease. Annual imaging studies will be conducted to monitor the stability of her condition.  2. Rheumatoid Arthritis. She has been diagnosed with rheumatoid arthritis and has tried multiple medications, including steroids, hydroxychloroquine , methotrexate  tablets, and methotrexate  injections, without significant improvement. She is currently on Enbrel  and has taken her 10th dose. She reports some improvement in her hands but continues to experience significant pain in her arms, shoulders, and back. The patient will continue with Enbrel  and monitor her symptoms. A second opinion with a rheumatologist has been scheduled for 11/2024.  3. Fever and Inflammation. She has been experiencing constant fevers, body pain, and sore throat since 04/2024. The ENT specialist ruled out infection and suggested inflammation. The hematologist indicated that the elevated white blood count might be due to steroids or inflammation.   I don't see active sarcoid but perhaps sarcoid + RA are contributing to fevers. The patient will continue on hydrocortisone tablets equivalent to 10 mg of prednisone  and add 5 mg of prednisone  as needed to manage symptoms. Communication with Dr. Faythe will be initiated to discuss her case further.  4. Cough. She has been experiencing a bad cough and has reached out to her primary care provider for a refill of her hydrocodone  cough medicine, which works well for her. Inhaled steroids were discussed as a potential addition if needed in the future.  5. Health Maintenance. She received her flu shot this year and reported a previous severe case of the flu despite vaccination. The patient will continue with regular vaccinations as recommended.  6. Pacemaker check. The patient is under the care of a cardiologist for her pacemaker, with check-ups scheduled every 2  years.    Called Dr. Juliane Faythe [facility]) --> left message that I gave patient prednisone  and why and that it can certainly be changed if he has other management ideas; left my name and number          [1] Past Medical History: Diagnosis Date  . Abnormality of lung on chest x-ray 06/24/2010   Qualifier: Diagnosis of   By: Darlean MD, Ozell NOVAK    . 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 Ovarian cancer    (CMD) 2011  . Severe obstructive sleep apnea 09/18/2022   2023 DEC watchPAT pAHI 32.8

## 2024-08-02 NOTE — Patient Instructions (Signed)
 We are sorry you are not feeling well.  I have refilled Hycodan to your pharmacy today for cough.  Medicare wellness visit is scheduled for next month.

## 2024-08-02 NOTE — Progress Notes (Signed)
   Subjective:    Patient ID: Julia Solis, female    DOB: 1951/05/03, 73 y.o.   MRN: [id number]  HPI 73 year old Female with complex medical history. Has multiple consultants including Dr. Dolphus, Rheumatologist, Dr. Faythe, Endocrinologist, Dr. Autumn who sees her for MGUS, Dr. Alvenia at [facility] for Sarcoidosis of the lung.  Hx of  seronegative Rheumatoid arthritis, positive ANA, osteoporosis, MGUS, pacemaker, pulmonary hypertension, FUO.  Recently has had issues with fever up to 103.5 degrees and congestion that started on Friday, October 24.  Has been prescribed Enbrel  in August by Dolphus and Methotrexate  in late September by Dr. Dolphus.    Hematologist , Dr. Autumn, noted white blood cell count was increased at 13,600 on October 14.  Had not had a normal white blood cell count since July 9 when it was 9600.  Additional white blood cell counts had been 13,000, 14,200, 12,000, and 13,100 throughout July August and September.  Lambda free light chain increased 2 weeks ago at 28.6.  Recent serum protein electrophoresis showed M protein spike.  Hematologist felt that white blood cell count increase was due to steroids and inflammation.  Pulmonologist has not wanted her to be on antibiotics at this time as her on 5 mg of Prednisone  and at times is taking up to 15 mg with a flare.  Hematologist feels that white blood cell count increase is due to steroids and inflammation.  It was recommended to decrease iron supplement she says.  Patient is requiring narcotic cough medication to control persistent cough.  She is now out of Hycodan and would like a refill.     Review of Systems     Objective:   Physical Exam  She is seen virtually in no acute distress.  Able to give a clear concise history and currently does not appear to be  tachypneic and is not coughing.      Assessment & Plan:  Fever of unknown origin  Cough-needs narcotic cough medication to control the cough  Sarcoidosis  followed by Dr. Gracy at [facility]  History of seronegative Rheumatoid arthritis  History of positive ANA  Osteoporosis  MGUS  Pulmonary hypertension  Pacemaker  Fever of unknown origin  Elevated white blood cell count etiology unclear perhaps due to steroids or inflammation or both  Remote history of ovarian cancer in 2011  Has reported severe respiratory illness and constant fever since April 08, 2024.  Temps can range up to 103.5 degrees peaking in the middle of the night.  She has body pain and sore throat with these episodes.  ENT physician saw her and found no evidence of infection and thought inflammation was causing fever.  Elevated white count since mid July is thought to be due to steroids or inflammation by hematologist and felt not to be an infectious process.  Endocrinologist expects adverse reaction to steroid and perhaps Cushing syndrome due to steroid.  She found 20 mg of prednisone  helpful but not really much improvement with prednisone  10 mg at all.  Does take Enbrel  and methotrexate .  History of pacemaker under the care of Cardiologist.  Patient is scheduled for Medicare wellness visit next month here.  Hycodan refilled today.

## 2024-08-09 ENCOUNTER — Other Ambulatory Visit: Payer: Self-pay | Admitting: Rheumatology

## 2024-08-09 ENCOUNTER — Other Ambulatory Visit (HOSPITAL_COMMUNITY): Payer: Self-pay

## 2024-08-09 ENCOUNTER — Other Ambulatory Visit: Payer: Self-pay

## 2024-08-09 DIAGNOSIS — Z79899 Other long term (current) drug therapy: Secondary | ICD-10-CM

## 2024-08-09 DIAGNOSIS — M0600 Rheumatoid arthritis without rheumatoid factor, unspecified site: Secondary | ICD-10-CM

## 2024-08-09 MED ORDER — ENBREL MINI 50 MG/ML ~~LOC~~ SOCT
50.0000 mg | SUBCUTANEOUS | 0 refills | Status: DC
  Filled 2024-08-09 – 2024-08-16 (×2): qty 12, 84d supply, fill #0

## 2024-08-09 NOTE — Telephone Encounter (Signed)
 Last Fill: 05/31/2024  Labs: 07/18/2024 WBC Count 13.6 RBC 3.82 Hemoglobin 11.2 HCT 35.8 RDW 17.8 Neutro Abs 11.5 Abs Immature Granulocytes 0.13  TB Gold: 06/29/2024 TB Gold is negative.   Next Visit: 10/12/2024  Last Visit: 07/12/2024  IK:Dzmnwzhjupcz rheumatoid arthritis (HCC)   Current Dose per office note 07/12/2024: Enbrel  50 mg sq injections once weekly   Okay to refill Enbrel ?

## 2024-08-10 ENCOUNTER — Ambulatory Visit: Payer: PPO

## 2024-08-10 DIAGNOSIS — D72829 Elevated white blood cell count, unspecified: Secondary | ICD-10-CM | POA: Diagnosis not present

## 2024-08-10 DIAGNOSIS — D869 Sarcoidosis, unspecified: Secondary | ICD-10-CM | POA: Diagnosis not present

## 2024-08-10 DIAGNOSIS — I495 Sick sinus syndrome: Secondary | ICD-10-CM

## 2024-08-10 DIAGNOSIS — D472 Monoclonal gammopathy: Secondary | ICD-10-CM | POA: Diagnosis not present

## 2024-08-10 DIAGNOSIS — R509 Fever, unspecified: Secondary | ICD-10-CM | POA: Diagnosis not present

## 2024-08-10 DIAGNOSIS — M069 Rheumatoid arthritis, unspecified: Secondary | ICD-10-CM | POA: Diagnosis not present

## 2024-08-10 DIAGNOSIS — D72828 Other elevated white blood cell count: Secondary | ICD-10-CM | POA: Diagnosis not present

## 2024-08-10 DIAGNOSIS — E242 Drug-induced Cushing's syndrome: Secondary | ICD-10-CM | POA: Diagnosis not present

## 2024-08-10 DIAGNOSIS — R52 Pain, unspecified: Secondary | ICD-10-CM | POA: Diagnosis not present

## 2024-08-11 LAB — CUP PACEART REMOTE DEVICE CHECK
Date Time Interrogation Session: 20251107094626
Implantable Lead Connection Status: 753985
Implantable Lead Connection Status: 753985
Implantable Lead Implant Date: 20220201
Implantable Lead Implant Date: 20220201
Implantable Lead Location: 753859
Implantable Lead Location: 753860
Implantable Pulse Generator Implant Date: 20220201
Pulse Gen Model: 2272
Pulse Gen Serial Number: 3896658

## 2024-08-14 NOTE — Progress Notes (Signed)
 Remote PPM Transmission

## 2024-08-16 ENCOUNTER — Ambulatory Visit: Payer: Self-pay | Admitting: Cardiovascular Disease

## 2024-08-16 ENCOUNTER — Encounter (INDEPENDENT_AMBULATORY_CARE_PROVIDER_SITE_OTHER): Payer: Self-pay

## 2024-08-16 ENCOUNTER — Other Ambulatory Visit (HOSPITAL_COMMUNITY): Payer: Self-pay

## 2024-08-16 ENCOUNTER — Other Ambulatory Visit: Payer: Self-pay

## 2024-08-16 NOTE — Progress Notes (Signed)
 Specialty Pharmacy Refill Coordination Note  MyChart Questionnaire Submission  Misa Fedorko is a 73 y.o. female contacted today regarding refills of specialty medication(s) Enbrel .  Doses on hand: (Patient-Rptd) 1   Injection date: (Patient-Rptd) 08/23/24  Patient requested: (Patient-Rptd) Pickup at [facility] date: 08/21/24  Medication will be filled on 08/18/24.

## 2024-08-17 ENCOUNTER — Other Ambulatory Visit: Payer: Self-pay

## 2024-08-17 NOTE — Progress Notes (Signed)
 Annual Wellness Visit   Patient Care Team: Nataliyah Packham, Ronal PARAS, MD as PCP - General (Internal Medicine) Mealor, Eulas BRAVO, MD as Consulting Physician (Cardiology) Dolphus Reiter, MD as Consulting Physician (Rheumatology)  Visit Date: 08/24/24   Chief Complaint  Patient presents with   Annual Exam   Medicare Wellness   Subjective:  Patient: Julia Solis, Female DOB: Apr 13, 1951, 73 y.o. MRN: [id number] Vitals:   08/24/24 1046  BP: 138/80   Julia Solis is a 73 y.o. Female who presents today for her Annual Wellness Visit. Patient has History of ovarian cancer; Nonspecific (abnormal) findings on radiological and other examination of body structure; ABNORMAL LUNG XRAY; Osteopenia; Back pain; Insomnia; Allergic rhinitis; Fatigue; Elevated LDL cholesterol level; Dry eye syndrome; NS (nuclear sclerosis); Dystrophy, Salzmann's nodular; Dry eye syndrome of both lacrimal glands; Symptomatic sinus bradycardia; Pacemaker; Hand arthritis; Edema; Monoclonal gammopathy of unknown significance; Primary osteoarthritis involving multiple joints; Seronegative rheumatoid arthritis (HCC); Acute recurrent maxillary sinusitis; Deviated nasal septum; Hypertrophy of nasal turbinates; Sarcoidosis; and Chronic rhinitis on their problem list.  She said that her feet have been swelling recently. She said that right now they are not swollen but later in the day it gets worse.   History of GE Reflux treated with famotidine 20 mg daily. She has been doing well with this.   History of Hyperlipidemia treated with Rosuvastatin  5 mg daily. 08/23/2024 Lipid panel CHOL 204, LDL 111, Otherwise WNL.    History of insomnia treated with lorazepam  1 mg daily.    History of dependent edema treated with furosemide  20 mg daily.    History of hypertension treated with losartan  25 mg daily. Blood pressure normal today at 138/80.   Has ortho issues and seeing [name].  She has pain in SI joint (right side). Did  have right SI joint injection with relief.   Diagnosed with seronegative rheumatoid arthritis by Dr. Dolphus on 08/04/23. Treated with Methotrexate  0.8 mg injected weekly and   Etanercept  1 mL injections weekly. She has an appointment with [facility] in February. She is also taking Predinsone 10 mg daily.  She says that she has been very fatigued.  Blood work done at rheumatologist office in October and November 2024. 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 Immunoelectrophoresis showed faint IgG lambda monoclonal protein. 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 Chinita Pasam.    She had back pain due to lifting up her granddaughter. She is doing PT and this is helping. 07/15/22 MRI lumbar spine without contrast showed: 1) Ordinary lumbar spine degeneration as described, 2) L4-5 moderate right foraminal stenosis. Prior left-sided decompression at this level with patent spinal canal, 3) L5-S1 bilateral subarticular recess narrowing that crowds the S1 nerve roots. Moderate right foraminal narrowing.   She has a history of pulmonary sarcoidosis followed by Dr. Tawni Never at [facility]. She reports no significant shortness of breath. She has had extensive evaluation. Overnight oximetry showed SpO2 dipping below 89%. She had bone biopsy showing granulomatous inflammation. She has known since the 1990s that she had an abnormal chest x-ray. History of mild pulmonary hypertension. Was diagnosed in 2019 after having a renal stone and CT suggested lymphadenopathy.  Had PET scan which was abnormal and subsequently diagnosed with sarcoidosis.   Had lithotripsy for treatment of renal stone in 2019.   History of lumbar disc disease status post lumbar surgery by Dr. Lang in 1997.  Has some chronic numbness left foot  from that surgery.   Seen in [facility] on 06/03/23 for worsening dyspnea and swelling of the hands and  legs. Chest X-ray showed no acute abnormality. BNP slightly elevated at 264. Discharged with prednisone  5 mg for thirty days, furosemide  20 mg daily for seven days. She still had some swelling on 06/11/23 follow-up here.   She has a remote history of ovarian cancer 1C grade 3 clear-cell status post debulking surgery March 2011 by Dr. Jerrol.  Had total abdominal hysterectomy, BSO, bilateral pelvic node dissection.  Had aortic node sampling, omentectomy, appendectomy and biopsies.  Lymph nodes were negative for malignancy.  Symptoms started February 2011 with abdominal gas and bloating.  CT of the chest at that time showed tiny nodules thought to be benign but in retrospect could have been sarcoidosis.    Additional past medical history: Ectopic pregnancy 1980, benign left breast biopsy 1993.  C-sections in 1992 in 1995.  Ganglion cyst removed from wrist in 1975.  Tonsillectomy 1957.  Had brow lift in 2008.   Labs 08/23/2024  Hemoglobin 11.3, MCHC 31.3, RDW 15.8, Calcium  8.5, Total Protein 5.6, Albumin 3.5, CHOL 204, LDL 111,   12/08/2023 Mammogram No mammographic evidence of malignancy. Repeat in one year.   11/13/2021 Colonoscopy Non- bleeding internal hemorrhoids. Diverticulosis in the sigmoid colon. Four 1 to 3 mm polyps in the ascending colon. Resected and retrieved. Pathology found to be tubular adenomas. The examination was otherwise normal on direct and retroflexion views. Repeat in 3 years.    Health Maintenance  Topic Date Due   Medicare Annual Wellness (AWV)  08/22/2024   Mammogram  12/06/2024   Bone Density Scan  01/07/2026   DTaP/Tdap/Td (3 - Td or Tdap) 03/19/2030   Colonoscopy  11/14/2031   Pneumococcal Vaccine: 50+ Years  Completed   Influenza Vaccine  Completed   Meningococcal B Vaccine  Aged Out   COVID-19 Vaccine  Discontinued   Hepatitis C Screening  Discontinued   Zoster Vaccines- Shingrix  Discontinued    Review of Systems  Constitutional:  Positive for  malaise/fatigue. Negative for fever.  HENT:  Negative for congestion.   Eyes:  Negative for blurred vision.  Respiratory:  Negative for cough and shortness of breath.   Cardiovascular:  Negative for chest pain, palpitations and leg swelling.  Gastrointestinal:  Negative for vomiting.  Musculoskeletal:  Negative for back pain.  Skin:  Negative for rash.  Neurological:  Negative for loss of consciousness and headaches.   Objective:  Vitals: body mass index is 31.24 kg/m. Today's Vitals   08/24/24 1046  BP: 138/80  Pulse: 68  SpO2: 96%  Weight: 182 lb (82.6 kg)  Height: 5' 4 (1.626 m)  PainSc: 0-No pain   Physical Exam Vitals and nursing note reviewed.  Constitutional:      General: She is not in acute distress.    Appearance: Normal appearance. She is not ill-appearing or toxic-appearing.  HENT:     Head: Normocephalic and atraumatic.     Right Ear: Hearing, tympanic membrane, ear canal and external ear normal.     Left Ear: Hearing, tympanic membrane, ear canal and external ear normal.     Mouth/Throat:     Pharynx: Oropharynx is clear.  Eyes:     Extraocular Movements: Extraocular movements intact.     Pupils: Pupils are equal, round, and reactive to light.  Neck:     Thyroid : No thyroid  mass, thyromegaly or thyroid  tenderness.     Vascular: No carotid bruit.  Cardiovascular:     Rate and Rhythm: Normal rate and regular rhythm. No extrasystoles are present.    Pulses:          Dorsalis pedis pulses are 2+ on the right side and 2+ on the left side.     Heart sounds: Normal heart sounds. No murmur heard.    No friction rub. No gallop.  Pulmonary:     Effort: Pulmonary effort is normal.     Breath sounds: Normal breath sounds. No decreased breath sounds, wheezing, rhonchi or rales.  Chest:     Chest wall: No mass.  Abdominal:     Palpations: Abdomen is soft. There is no hepatomegaly, splenomegaly or mass.     Tenderness: There is no abdominal tenderness.     Hernia:  No hernia is present.  Genitourinary:    Comments: Pelvic exam deferred.  Musculoskeletal:     Cervical back: Normal range of motion.     Right lower leg: Edema present.     Left lower leg: Edema present.     Comments: Trace edema.   Lymphadenopathy:     Cervical: No cervical adenopathy.     Upper Body:     Right upper body: No supraclavicular adenopathy.     Left upper body: No supraclavicular adenopathy.  Skin:    General: Skin is warm and dry.  Neurological:     General: No focal deficit present.     Mental Status: She is alert and oriented to person, place, and time. Mental status is at baseline.     Sensory: Sensation is intact.     Motor: Motor function is intact. No weakness.     Deep Tendon Reflexes: Reflexes are normal and symmetric.  Psychiatric:        Attention and Perception: Attention normal.        Mood and Affect: Mood normal.        Speech: Speech normal.        Behavior: Behavior normal.        Thought Content: Thought content normal.        Cognition and Memory: Cognition normal.        Judgment: Judgment normal.     Current Outpatient Medications  Medication Instructions   alendronate  (FOSAMAX ) 70 mg, Oral, Weekly, Take with a full glass of water on an empty stomach.   Enbrel  Mini 50 mg, Subcutaneous, Weekly   famotidine (PEPCID) 20 mg, Daily at bedtime   fluticasone (FLONASE) 50 MCG/ACT nasal spray 3 times weekly   folic acid  (FOLVITE ) 2 mg, Oral, Daily   furosemide  (LASIX ) 20 mg, Oral, Daily   hydrocortisone (CORTEF) 10 MG tablet 3 tablets between 6AM to 8AM, 1 tablet around 2PM Orally Twice a day   loratadine (CLARITIN) 10 mg, Daily   LORazepam  (ATIVAN ) 1 MG tablet TAKE 1 TABLET BY MOUTH EVERY DAY AT BEDTIME AS NEEDED   losartan  (COZAAR ) 100 mg, Oral, Daily   methotrexate  20 mg, Subcutaneous, Weekly   Multiple Vitamin (MULTIVITAMIN WITH MINERALS) TABS tablet 1 tablet, Daily   oxyCODONE  (OXY IR/ROXICODONE ) 5 MG immediate release tablet one-quarter  to one-half tablet as needed Orally up to once a day   Polyethyl Glycol-Propyl Glycol (SYSTANE) 0.4-0.3 % GEL ophthalmic gel 1 application , 2 times daily   predniSONE  (DELTASONE ) 10 mg, Daily with breakfast   rosuvastatin  (CRESTOR ) 5 MG tablet TAKE 1 TABLET BY MOUTH 3 TIMES A WEEK WITH SUPPER   TUBERCULIN SYR 1CC/27GX1/2 (B-D TB SYRINGE 1CC/27GX1/2)  27G X 1/2 1 ML MISC 1 Syringe, Subcutaneous, Weekly   Vitamin D  2,000 Units, Daily   Past Medical History:  Diagnosis Date   Arthritis 2024   Cancer [facility]) 2011   ovarian   Cataract 2024   Complication of anesthesia    GERD (gastroesophageal reflux disease)    Hearing aid worn    both ears   History of chemotherapy 2011   History of hiatal hernia    History of kidney stones    Hypertension    MGUS (monoclonal gammopathy of unknown significance)    dx by hematology per patient   Neuromuscular disorder (HCC)    left foot numbness since  eback surgery   Osteoporosis    PONV (postoperative nausea and vomiting)    Presence of permanent cardiac pacemaker    Sarcoidosis    in remission sees pumonary  once a year dr bellinger lov 08-21-2020 care everywhere   Sick sinus syndrome [facility])    Sleep apnea 2024   Wears glasses    Medical/Surgical History Narrative:  Allergic/Intolerant to:  Allergies  Allergen Reactions   Augmentin  [Amoxicillin -Pot Clavulanate]     Diarrhea    Flomax [Tamsulosin Hcl] Other (See Comments)    Dizziness   Latex Other (See Comments)    Skin irritation   Tramadol  Other (See Comments)    jittery, insomnia   Ciprofloxacin Rash    Past Surgical History:  Procedure Laterality Date   ABDOMINAL HYSTERECTOMY  2011   APPENDECTOMY  2011    ?with hysterectomy   BACK SURGERY  1997   lumbar   CARPAL TUNNEL RELEASE Right    CATARACT EXTRACTION Right 08/10/2023   CATARACT EXTRACTION Left 08/24/2023   CESAREAN SECTION  1982, 1985   COSMETIC SURGERY  2007   brow lift   CYSTOSCOPY WITH RETROGRADE PYELOGRAM,  URETEROSCOPY AND STENT PLACEMENT Left 02/04/2021   Procedure: CYSTOSCOPY WITH RETROGRADE PYELOGRAM, URETEROSCOPY AND STENT PLACEMENT;  Surgeon: Rosalind Zachary NOVAK, MD;  Location: [facility];  Service: Urology;  Laterality: Left;  1 HR; nephrolithiasis   ECTOPIC PREGNANCY SURGERY  1980   EXTRACORPOREAL SHOCK WAVE LITHOTRIPSY Left 06/23/2018   Procedure: LEFT EXTRACORPOREAL SHOCK WAVE LITHOTRIPSY (ESWL);  Surgeon: Sherrilee Belvie CROME, MD;  Location: [facility];  Service: Urology;  Laterality: Left;   GANGLION CYST EXCISION  1975   HOLMIUM LASER APPLICATION Left 02/04/2021   Procedure: HOLMIUM LASER APPLICATION;  Surgeon: Rosalind Zachary NOVAK, MD;  Location: [facility];  Service: Urology;  Laterality: Left;   PACEMAKER IMPLANT N/A 11/13/2020   Procedure: PACEMAKER IMPLANT;  Surgeon: Kelsie Agent, MD;  Location: [facility];  Service: Cardiovascular;  Laterality: N/A;   SPINE SURGERY  1997   TONSILLECTOMY  1957   adenoids removed   Family History  Problem Relation Age of Onset   Hypertension Mother    Hearing loss Mother    Prostate cancer Father 49   Cancer Father    Hypertension Father    Stroke Father    Colon polyps Sister    Colon cancer Maternal Grandmother        dx in her 63s   Diabetes Maternal Grandmother    Heart disease Maternal Grandfather    Hearing loss Maternal Grandfather    Colon cancer Paternal Grandmother    Heart disease Paternal Grandfather    Breast cancer Paternal Aunt 34   Colon cancer Paternal Aunt        dx in her 10s  Melanoma Cousin 5   Breast cancer Cousin 77       multiple cousins   Colon polyps Cousin    Breast cancer Cousin 62   Breast cancer Cousin        father's maternal 1st cousin   Breast cancer Cousin        Father's first cousin's daughter   Cancer Other        maternal great uncle with Cancer - NOS   Cancer Other        maternal great grandmother with either colon or stomach cancer   Breast cancer  Other        fathers maternal aunt   Family History Narrative: ather died at age 70 with prostate cancer.  Brother died at age 21 of an AVM rupture.  1 sister in good health.   Social history: Divorced. Has 2 adult daughters. Does not smoke but did smoke from the ages of 57 through 6. Social alcohol consumption. 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     Most Recent Health Risks Assessment:   Most Recent Social Determinants of Health (Including Hx of Tobacco, Alcohol, and Drug Use) SDOH Screenings   Food Insecurity: No Food Insecurity (08/24/2024)  Housing: Low Risk  (08/24/2024)  Transportation Needs: No Transportation Needs (08/24/2024)  Utilities: Not At Risk (08/24/2024)  Alcohol Screen: Low Risk  (08/24/2024)  Depression (PHQ2-9): Low Risk  (08/24/2024)  Financial Resource Strain: Low Risk  (08/24/2024)  Physical Activity: Inactive (08/24/2024)  Social Connections: Moderately Isolated (08/24/2024)  Stress: No Stress Concern Present (08/24/2024)  Tobacco Use: Medium Risk (08/24/2024)  Health Literacy: Adequate Health Literacy (08/24/2024)   Social History   Tobacco Use   Smoking status: Former    Current packs/day: 0.00    Average packs/day: 1.5 packs/day for 11.0 years (16.5 ttl pk-yrs)    Types: Cigarettes    Start date: 10/05/1965    Quit date: 10/05/1976    Years since quitting: 47.9    Passive exposure: Past   Smokeless tobacco: Never  Vaping Use   Vaping status: Never Used  Substance Use Topics   Alcohol use: Not Currently   Drug use: No   Most Recent Functional Status Assessment:    08/20/2024    9:01 AM  In your present state of health, do you have any difficulty performing the following activities:  Hearing? 0  Vision? 0  Difficulty concentrating or making decisions? 0  Walking or climbing stairs? 1  Dressing or bathing? 0  Doing errands, shopping? 1  Preparing Food and eating ? Y  Using the Toilet? N  In the past six months, have  you accidently leaked urine? N  Do you have problems with loss of bowel control? N  Managing your Medications? N  Managing your Finances? N  Housekeeping or managing your Housekeeping? Y   Most Recent Fall Risk Assessment:    08/24/2024   10:47 AM  Fall Risk   Falls in the past year? 0  Number falls in past yr: 0  Injury with Fall? 0  Risk for fall due to : No Fall Risks  Follow up Falls prevention discussed;Education provided;Falls evaluation completed   Most Recent Anxiety/Depression Screenings:    08/24/2024   11:04 AM 08/01/2024    2:24 PM  PHQ 2/9 Scores  PHQ - 2 Score 1 0      03/23/2024   12:41 PM  GAD 7 : Generalized Anxiety Score  Nervous, Anxious, on  Edge 1  Control/stop worrying 0  Worry too much - different things 1  Trouble relaxing 0  Restless 0  Easily annoyed or irritable 0  Afraid - awful might happen 0  Total GAD 7 Score 2  Anxiety Difficulty Not difficult at all   Most Recent Cognitive Screening:    08/24/2024   10:47 AM  6CIT Screen  What Year? 0 points  What month? 0 points  What time? 0 points  Count back from 20 0 points  Months in reverse 0 points  Repeat phrase 0 points  Total Score 0 points    Results:  Studies Obtained And Personally Reviewed By Me:   12/08/2023 Mammogram No mammographic evidence of malignancy. Repeat in one year.   11/13/2021 Colonoscopy Non- bleeding internal hemorrhoids. Diverticulosis in the sigmoid colon. Four 1 to 3 mm polyps in the ascending colon. Resected and retrieved. Pathology found to be tubular adenomas. The examination was otherwise normal on direct and retroflexion views. Repeat in 3 years.  Labs:  CBC w/ Differential Lab Results  Component Value Date   WBC 9.0 08/22/2024   RBC 3.98 08/22/2024   HGB 11.3 (L) 08/22/2024   HCT 36.1 08/22/2024   PLT 265 08/22/2024   MCV 90.7 08/22/2024   MCH 28.4 08/22/2024   MCHC 31.3 (L) 08/22/2024   RDW 15.8 (H) 08/22/2024   MPV 9.8 08/22/2024    LYMPHSABS 1.3 07/18/2024   MONOABS 0.7 07/18/2024   BASOSABS 9 08/22/2024    Comprehensive Metabolic Panel Lab Results  Component Value Date   NA 142 08/22/2024   K 4.3 08/22/2024   CL 104 08/22/2024   CO2 30 08/22/2024   GLUCOSE 84 08/22/2024   BUN 18 08/22/2024   CREATININE 0.77 08/22/2024   CALCIUM  8.5 (L) 08/22/2024   PROT 5.6 (L) 08/22/2024   ALBUMIN 3.7 07/18/2024   AST 15 08/22/2024   ALT 8 08/22/2024   ALKPHOS 77 07/18/2024   BILITOT 0.5 08/22/2024   EGFR 82 08/22/2024   GFRNONAA >60 07/18/2024   Lipid Panel  Lab Results  Component Value Date   CHOL 204 (H) 08/22/2024   HDL 73 08/22/2024   LDLCALC 111 (H) 08/22/2024   TRIG 101 08/22/2024   A1c Lab Results  Component Value Date   HGBA1C 5.2 08/22/2024    TSH Lab Results  Component Value Date   TSH 3.02 08/22/2024   Assessment & Plan:  No orders of the defined types were placed in this encounter.  Meds ordered this encounter  Medications   furosemide  (LASIX ) 20 MG tablet    Sig: Take 1 tablet (20 mg total) by mouth daily.    Dispense:  30 tablet    Refill:  1    She said that her feet have been swelling recently. She said that right now they are not swollen but later in the day it gets worse.    Lasix  20 mg daily prescribed.   GE Reflux: treated with famotidine 20 mg daily. She has been doing well with this.   Hyperlipidemia: treated with Rosuvastatin  5 mg daily. 08/23/2024 Lipid panel CHOL 204, LDL 111, Otherwise WNL.    Insomnia: treated with lorazepam  1 mg daily.    Dependent edema: treated with furosemide  20 mg daily.    Hypertension: treated with losartan  25 mg daily. Blood pressure normal today at 138/80.   Seronegative rheumatoid arthritis:  Treated with Methotrexate  0.8 mg injected weekly and   Etanercept  1 mL injections weekly. She has an  appointment with [facility] in February. She is also taking Predinsone 10 mg daily.  She says that she has been very  fatigued.  12/08/2023 Mammogram No mammographic evidence of malignancy. Repeat in one year.   11/13/2021 Colonoscopy Non- bleeding internal hemorrhoids. Diverticulosis in the sigmoid colon. Four 1 to 3 mm polyps in the ascending colon. Resected and retrieved. Pathology found to be tubular adenomas. The examination was otherwise normal on direct and retroflexion views. Repeat in 3 years.     Annual Wellness Visit done today including the all of the following: Reviewed patient's Family Medical History Reviewed patient's SDOH and reviewed tobacco, alcohol, and drug use.  Reviewed and updated list of patient's medical providers Assessment of cognitive impairment was done Assessed patient's functional ability Established a written schedule for health screening services Health Risk Assessent Completed and Reviewed  Discussed health benefits of physical activity, and encouraged her to engage in regular exercise appropriate for her age and condition.    I,Makayla C Reid,acting as a scribe for Ronal JINNY Hailstone, MD.,have documented all relevant documentation on the behalf of Ronal JINNY Hailstone, MD,as directed by  Ronal JINNY Hailstone, MD while in the presence of Ronal JINNY Hailstone, MD.  I, Ronal JINNY Hailstone, MD, have reviewed all documentation for and agree with the above Annual Wellness Visit documentation.  Ronal JINNY Hailstone, MD Internal Medicine 08/24/2024

## 2024-08-17 NOTE — Progress Notes (Signed)
 Clinical Intervention Note  Clinical Intervention Notes: Patient reports starting prednisone  10 mg, no DDIs identified with her Enbrel .   Clinical Intervention Outcomes: Prevention of an adverse drug event   Silvano LOISE Blair Karel Santa

## 2024-08-22 ENCOUNTER — Other Ambulatory Visit: Payer: PPO

## 2024-08-22 DIAGNOSIS — R600 Localized edema: Secondary | ICD-10-CM | POA: Diagnosis not present

## 2024-08-22 DIAGNOSIS — E785 Hyperlipidemia, unspecified: Secondary | ICD-10-CM | POA: Diagnosis not present

## 2024-08-22 DIAGNOSIS — E242 Drug-induced Cushing's syndrome: Secondary | ICD-10-CM | POA: Diagnosis not present

## 2024-08-22 DIAGNOSIS — E7800 Pure hypercholesterolemia, unspecified: Secondary | ICD-10-CM

## 2024-08-22 DIAGNOSIS — M810 Age-related osteoporosis without current pathological fracture: Secondary | ICD-10-CM | POA: Diagnosis not present

## 2024-08-22 DIAGNOSIS — R609 Edema, unspecified: Secondary | ICD-10-CM | POA: Diagnosis not present

## 2024-08-22 DIAGNOSIS — M0600 Rheumatoid arthritis without rheumatoid factor, unspecified site: Secondary | ICD-10-CM

## 2024-08-22 DIAGNOSIS — D649 Anemia, unspecified: Secondary | ICD-10-CM

## 2024-08-22 DIAGNOSIS — Z1329 Encounter for screening for other suspected endocrine disorder: Secondary | ICD-10-CM | POA: Diagnosis not present

## 2024-08-22 DIAGNOSIS — Z0000 Encounter for general adult medical examination without abnormal findings: Secondary | ICD-10-CM | POA: Diagnosis not present

## 2024-08-22 DIAGNOSIS — I10 Essential (primary) hypertension: Secondary | ICD-10-CM

## 2024-08-23 ENCOUNTER — Ambulatory Visit: Payer: Self-pay | Admitting: Internal Medicine

## 2024-08-23 LAB — LIPID PANEL
Cholesterol: 204 mg/dL — ABNORMAL HIGH (ref <200)
HDL: 73 mg/dL (ref >=50)
LDL Cholesterol (Calc): 111 mg/dL — ABNORMAL HIGH
Non-HDL Cholesterol (Calc): 131 mg/dL — ABNORMAL HIGH (ref <130)
Total CHOL/HDL Ratio: 2.8 (calc) (ref <5.0)
Triglycerides: 101 mg/dL (ref <150)

## 2024-08-23 LAB — COMPREHENSIVE METABOLIC PANEL WITH GFR
AG Ratio: 1.7 (calc) (ref 1.0–2.5)
ALT: 8 U/L (ref 6–29)
AST: 15 U/L (ref 10–35)
Albumin: 3.5 g/dL — ABNORMAL LOW (ref 3.6–5.1)
Alkaline phosphatase (APISO): 55 U/L (ref 37–153)
BUN: 18 mg/dL (ref 7–25)
CO2: 30 mmol/L (ref 20–32)
Calcium: 8.5 mg/dL — ABNORMAL LOW (ref 8.6–10.4)
Chloride: 104 mmol/L (ref 98–110)
Creat: 0.77 mg/dL (ref 0.60–1.00)
Globulin: 2.1 g/dL (ref 1.9–3.7)
Glucose, Bld: 84 mg/dL (ref 65–99)
Potassium: 4.3 mmol/L (ref 3.5–5.3)
Sodium: 142 mmol/L (ref 135–146)
Total Bilirubin: 0.5 mg/dL (ref 0.2–1.2)
Total Protein: 5.6 g/dL — ABNORMAL LOW (ref 6.1–8.1)
eGFR: 82 mL/min/1.73m2 (ref >=60)

## 2024-08-23 LAB — HEMOGLOBIN A1C
Hgb A1c MFr Bld: 5.2 % (ref <5.7)
Mean Plasma Glucose: 103 mg/dL
eAG (mmol/L): 5.7 mmol/L

## 2024-08-23 LAB — CBC WITH DIFFERENTIAL/PLATELET
Absolute Lymphocytes: 1962 {cells}/uL (ref 850–3900)
Absolute Monocytes: 873 {cells}/uL (ref 200–950)
Basophils Absolute: 9 {cells}/uL (ref 0–200)
Basophils Relative: 0.1 %
Eosinophils Absolute: 252 {cells}/uL (ref 15–500)
Eosinophils Relative: 2.8 %
HCT: 36.1 % (ref 35.0–45.0)
Hemoglobin: 11.3 g/dL — ABNORMAL LOW (ref 11.7–15.5)
MCH: 28.4 pg (ref 27.0–33.0)
MCHC: 31.3 g/dL — ABNORMAL LOW (ref 32.0–36.0)
MCV: 90.7 fL (ref 80.0–100.0)
MPV: 9.8 fL (ref 7.5–12.5)
Monocytes Relative: 9.7 %
Neutro Abs: 5904 {cells}/uL (ref 1500–7800)
Neutrophils Relative %: 65.6 %
Platelets: 265 Thousand/uL (ref 140–400)
RBC: 3.98 Million/uL (ref 3.80–5.10)
RDW: 15.8 % — ABNORMAL HIGH (ref 11.0–15.0)
Total Lymphocyte: 21.8 %
WBC: 9 Thousand/uL (ref 3.8–10.8)

## 2024-08-23 LAB — TSH: TSH: 3.02 m[IU]/L (ref 0.40–4.50)

## 2024-08-24 ENCOUNTER — Encounter: Payer: Self-pay | Admitting: Internal Medicine

## 2024-08-24 ENCOUNTER — Ambulatory Visit: Payer: PPO | Admitting: Internal Medicine

## 2024-08-24 VITALS — BP 138/80 | HR 68 | Ht 64.0 in | Wt 182.0 lb

## 2024-08-24 DIAGNOSIS — Z950 Presence of cardiac pacemaker: Secondary | ICD-10-CM

## 2024-08-24 DIAGNOSIS — D860 Sarcoidosis of lung: Secondary | ICD-10-CM

## 2024-08-24 DIAGNOSIS — M0600 Rheumatoid arthritis without rheumatoid factor, unspecified site: Secondary | ICD-10-CM | POA: Diagnosis not present

## 2024-08-24 DIAGNOSIS — E242 Drug-induced Cushing's syndrome: Secondary | ICD-10-CM

## 2024-08-24 DIAGNOSIS — Z0000 Encounter for general adult medical examination without abnormal findings: Secondary | ICD-10-CM | POA: Diagnosis not present

## 2024-08-24 DIAGNOSIS — D649 Anemia, unspecified: Secondary | ICD-10-CM

## 2024-08-24 DIAGNOSIS — G4700 Insomnia, unspecified: Secondary | ICD-10-CM

## 2024-08-24 DIAGNOSIS — I10 Essential (primary) hypertension: Secondary | ICD-10-CM | POA: Diagnosis not present

## 2024-08-24 DIAGNOSIS — K219 Gastro-esophageal reflux disease without esophagitis: Secondary | ICD-10-CM

## 2024-08-24 DIAGNOSIS — R600 Localized edema: Secondary | ICD-10-CM

## 2024-08-24 DIAGNOSIS — Z8679 Personal history of other diseases of the circulatory system: Secondary | ICD-10-CM

## 2024-08-24 DIAGNOSIS — E785 Hyperlipidemia, unspecified: Secondary | ICD-10-CM | POA: Diagnosis not present

## 2024-08-24 DIAGNOSIS — R609 Edema, unspecified: Secondary | ICD-10-CM

## 2024-08-24 DIAGNOSIS — E7800 Pure hypercholesterolemia, unspecified: Secondary | ICD-10-CM

## 2024-08-24 DIAGNOSIS — Z8543 Personal history of malignant neoplasm of ovary: Secondary | ICD-10-CM

## 2024-08-24 DIAGNOSIS — H905 Unspecified sensorineural hearing loss: Secondary | ICD-10-CM

## 2024-08-24 DIAGNOSIS — M810 Age-related osteoporosis without current pathological fracture: Secondary | ICD-10-CM

## 2024-08-24 DIAGNOSIS — R5383 Other fatigue: Secondary | ICD-10-CM

## 2024-08-24 MED ORDER — FUROSEMIDE 20 MG PO TABS: 20.0000 mg | ORAL_TABLET | Freq: Every day | ORAL | 1 refills | Status: AC

## 2024-08-24 NOTE — Patient Instructions (Addendum)
 Julia Solis,  Thank you for taking the time for your Medicare Wellness Visit. I appreciate your continued commitment to your health goals. Please review the care plan we discussed, and feel free to reach out if I can assist you further.  Please note that Annual Wellness Visits do not include a physical exam. Some assessments may be limited, especially if the visit was conducted virtually. If needed, we may recommend an in-person follow-up with your provider.  Ongoing Care Seeing your primary care provider every 3 to 6 months helps us  monitor your health and provide consistent, personalized care.   Referrals If a referral was made during today's visit and you haven't received any updates within two weeks, please contact the referred provider directly to check on the status.  Recommended Screenings:  Health Maintenance  Topic Date Due   Medicare Annual Wellness Visit  08/22/2024   Breast Cancer Screening  12/06/2024   Osteoporosis screening with Bone Density Scan  01/07/2026   DTaP/Tdap/Td vaccine (3 - Td or Tdap) 03/19/2030   Colon Cancer Screening  11/14/2031   Pneumococcal Vaccine for age over 48  Completed   Flu Shot  Completed   Meningitis B Vaccine  Aged Out   COVID-19 Vaccine  Discontinued   Hepatitis C Screening  Discontinued   Zoster (Shingles) Vaccine  Discontinued       08/24/2024   10:47 AM  Advanced Directives  Does Patient Have a Medical Advance Directive? Yes  Type of Advance Directive Living will;Healthcare Power of Attorney  Does patient want to make changes to medical advance directive? No - Patient declined  Copy of Healthcare Power of Attorney in Chart? No - copy requested    Vision: Annual vision screenings are recommended for early detection of glaucoma, cataracts, and diabetic retinopathy. These exams can also reveal signs of chronic conditions such as diabetes and high blood pressure.  Dental: Annual dental screenings help detect early signs of oral cancer,  gum disease, and other conditions linked to overall health, including heart disease and diabetes.  Please see the attached documents for additional preventive care recommendations.     Next appointment: Follow up in one year for your annual wellness visit    Preventive Care 65 Years and Older, Female Preventive care refers to lifestyle choices and visits with your health care provider that can promote health and wellness. What does preventive care include? A yearly physical exam. This is also called an annual well check. Dental exams once or twice a year. Routine eye exams. Ask your health care provider how often you should have your eyes checked. Personal lifestyle choices, including: Daily care of your teeth and gums. Regular physical activity. Eating a healthy diet. Avoiding tobacco and drug use. Limiting alcohol use. Practicing safe sex. Taking low-dose aspirin every day. Taking vitamin and mineral supplements as recommended by your health care provider. What happens during an annual well check? The services and screenings done by your health care provider during your annual well check will depend on your age, overall health, lifestyle risk factors, and family history of disease. Counseling  Your health care provider may ask you questions about your: Alcohol use. Tobacco use. Drug use. Emotional well-being. Home and relationship well-being. Sexual activity. Eating habits. History of falls. Memory and ability to understand (cognition). Work and work astronomer. Reproductive health. Screening  You may have the following tests or measurements: Height, weight, and BMI. Blood pressure. Lipid and cholesterol levels. These may be checked every 5 years, or  more frequently if you are over 67 years old. Skin check. Lung cancer screening. You may have this screening every year starting at age 58 if you have a 30-pack-year history of smoking and currently smoke or have quit  within the past 15 years. Fecal occult blood test (FOBT) of the stool. You may have this test every year starting at age 74. Flexible sigmoidoscopy or colonoscopy. You may have a sigmoidoscopy every 5 years or a colonoscopy every 10 years starting at age 106. Hepatitis C blood test. Hepatitis B blood test. Sexually transmitted disease (STD) testing. Diabetes screening. This is done by checking your blood sugar (glucose) after you have not eaten for a while (fasting). You may have this done every 1-3 years. Bone density scan. This is done to screen for osteoporosis. You may have this done starting at age 30. Mammogram. This may be done every 1-2 years. Talk to your health care provider about how often you should have regular mammograms. Talk with your health care provider about your test results, treatment options, and if necessary, the need for more tests. Vaccines  Your health care provider may recommend certain vaccines, such as: Influenza vaccine. This is recommended every year. Tetanus, diphtheria, and acellular pertussis (Tdap, Td) vaccine. You may need a Td booster every 10 years. Zoster vaccine. You may need this after age 56. Pneumococcal 13-valent conjugate (PCV13) vaccine. One dose is recommended after age 60. Pneumococcal polysaccharide (PPSV23) vaccine. One dose is recommended after age 56. Talk to your health care provider about which screenings and vaccines you need and how often you need them. This information is not intended to replace advice given to you by your health care provider. Make sure you discuss any questions you have with your health care provider. Document Released: 10/18/2015 Document Revised: 06/10/2016 Document Reviewed: 07/23/2015 Elsevier Interactive Patient Education  2017 [employer].  Fall Prevention in the Home Falls can cause injuries. They can happen to people of all ages. There are many things you can do to make your home safe and to help prevent  falls. What can I do on the outside of my home? Regularly fix the edges of walkways and driveways and fix any cracks. Remove anything that might make you trip as you walk through a door, such as a raised step or threshold. Trim any bushes or trees on the path to your home. Use bright outdoor lighting. Clear any walking paths of anything that might make someone trip, such as rocks or tools. Regularly check to see if handrails are loose or broken. Make sure that both sides of any steps have handrails. Any raised decks and porches should have guardrails on the edges. Have any leaves, snow, or ice cleared regularly. Use sand or salt on walking paths during winter. Clean up any spills in your garage right away. This includes oil or grease spills. What can I do in the bathroom? Use night lights. Install grab bars by the toilet and in the tub and shower. Do not use towel bars as grab bars. Use non-skid mats or decals in the tub or shower. If you need to sit down in the shower, use a plastic, non-slip stool. Keep the floor dry. Clean up any water that spills on the floor as soon as it happens. Remove soap buildup in the tub or shower regularly. Attach bath mats securely with double-sided non-slip rug tape. Do not have throw rugs and other things on the floor that can make you trip. What  can I do in the bedroom? Use night lights. Make sure that you have a light by your bed that is easy to reach. Do not use any sheets or blankets that are too big for your bed. They should not hang down onto the floor. Have a firm chair that has side arms. You can use this for support while you get dressed. Do not have throw rugs and other things on the floor that can make you trip. What can I do in the kitchen? Clean up any spills right away. Avoid walking on wet floors. Keep items that you use a lot in easy-to-reach places. If you need to reach something above you, use a strong step stool that has a grab  bar. Keep electrical cords out of the way. Do not use floor polish or wax that makes floors slippery. If you must use wax, use non-skid floor wax. Do not have throw rugs and other things on the floor that can make you trip. What can I do with my stairs? Do not leave any items on the stairs. Make sure that there are handrails on both sides of the stairs and use them. Fix handrails that are broken or loose. Make sure that handrails are as long as the stairways. Check any carpeting to make sure that it is firmly attached to the stairs. Fix any carpet that is loose or worn. Avoid having throw rugs at the top or bottom of the stairs. If you do have throw rugs, attach them to the floor with carpet tape. Make sure that you have a light switch at the top of the stairs and the bottom of the stairs. If you do not have them, ask someone to add them for you. What else can I do to help prevent falls? Wear shoes that: Do not have high heels. Have rubber bottoms. Are comfortable and fit you well. Are closed at the toe. Do not wear sandals. If you use a stepladder: Make sure that it is fully opened. Do not climb a closed stepladder. Make sure that both sides of the stepladder are locked into place. Ask someone to hold it for you, if possible. Clearly mark and make sure that you can see: Any grab bars or handrails. First and last steps. Where the edge of each step is. Use tools that help you move around (mobility aids) if they are needed. These include: Canes. Walkers. Scooters. Crutches. Turn on the lights when you go into a dark area. Replace any light bulbs as soon as they burn out. Set up your furniture so you have a clear path. Avoid moving your furniture around. If any of your floors are uneven, fix them. If there are any pets around you, be aware of where they are. Review your medicines with your doctor. Some medicines can make you feel dizzy. This can increase your chance of falling. Ask  your doctor what other things that you can do to help prevent falls. This information is not intended to replace advice given to you by your health care provider. Make sure you discuss any questions you have with your health care provider. Document Released: 07/18/2009 Document Revised: 02/27/2016 Document Reviewed: 10/26/2014 Elsevier Interactive Patient Education  2017 [employer].  Continue current medications.  Continue close follow-up with consultants.  Return in 1 year or as needed.  Refilled Lasix  for dependent edema.

## 2024-08-24 NOTE — Progress Notes (Signed)
 Chief Complaint  Patient presents with   Annual Exam   Medicare Wellness     Subjective:   Julia Solis is a 73 y.o. F who presents for a Medicare Annual Wellness Visit, annual exam and evaluation of complex medical issues.  .Has a complicated and complex medical hx. 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 Alvenia at [facility].Seronegative RA on Enbrel  and MTX followed by Dr. Dolphus. Hx of pulmonary HTN, remote hx of stage 1 ovarian cancer dx early 2011 last seen by oncologist 2021 and at that time CA125 was normal. Was discharged from oncology office due to stable labs and felt to be cured of ovarian cancer.Sarcoidosis followed at [facility] and is well managed with no active disease. Hx of iron deficiency anemia.Had right carpal tunnel release 2024. Has seen Dr. 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 Is on  Hydrocortisone 10mg   tablet taking 3 tabs in am, 1 tab at 2 pm. Hyperlipidemia treated with low dose statin.Osteoporosis treated by Rheumatology with Fosamax  and T score - 2.9 noted April 2025. Hypertrophy of nasal turbinates see by Dr. Karis  Eye exam done Dec 2024 and needs repeat next month.  Allergies (verified) Augmentin  [amoxicillin -pot clavulanate], Flomax [tamsulosin hcl], Latex, Tramadol , and Ciprofloxacin   History: Past Medical History:  Diagnosis Date   Arthritis 2024   Cancer (HCC) 2011   ovarian   Cataract 2024   Complication of anesthesia    GERD (gastroesophageal reflux disease)    Hearing aid worn    both ears   History of chemotherapy 2011   History of hiatal hernia    History of kidney stones    Hypertension    MGUS (monoclonal gammopathy of unknown significance)    dx by hematology per patient   Neuromuscular disorder (HCC)    left foot numbness since  eback surgery   Osteoporosis    PONV (postoperative nausea and vomiting)    Presence of permanent cardiac pacemaker    Sarcoidosis    in  remission sees pumonary  once a year dr bellinger lov 08-21-2020 care everywhere   Sick sinus syndrome [facility])    Sleep apnea 2024   Wears glasses    Past Surgical History:  Procedure Laterality Date   ABDOMINAL HYSTERECTOMY  2011   APPENDECTOMY  2011    ?with hysterectomy   BACK SURGERY  1997   lumbar   CARPAL TUNNEL RELEASE Right    CATARACT EXTRACTION Right 08/10/2023   CATARACT EXTRACTION Left 08/24/2023   CESAREAN SECTION  1982, 1985   COSMETIC SURGERY  2007   brow lift   CYSTOSCOPY WITH RETROGRADE PYELOGRAM, URETEROSCOPY AND STENT PLACEMENT Left 02/04/2021   Procedure: CYSTOSCOPY WITH RETROGRADE PYELOGRAM, URETEROSCOPY AND STENT PLACEMENT;  Surgeon: Rosalind Zachary NOVAK, MD;  Location: [facility];  Service: Urology;  Laterality: Left;  1 HR; nephrolithiasis   ECTOPIC PREGNANCY SURGERY  1980   EXTRACORPOREAL SHOCK WAVE LITHOTRIPSY Left 06/23/2018   Procedure: LEFT EXTRACORPOREAL SHOCK WAVE LITHOTRIPSY (ESWL);  Surgeon: Sherrilee Belvie CROME, MD;  Location: [facility];  Service: Urology;  Laterality: Left;   GANGLION CYST EXCISION  1975   HOLMIUM LASER APPLICATION Left 02/04/2021   Procedure: HOLMIUM LASER APPLICATION;  Surgeon: Rosalind Zachary NOVAK, MD;  [facility];  Service: Urology;  Laterality: Left;   PACEMAKER IMPLANT N/A 11/13/2020   Procedure: PACEMAKER IMPLANT;  Surgeon: Kelsie Agent, MD;  Location: [facility];  Service: Cardiovascular;  Laterality: N/A;   SPINE SURGERY  1997   TONSILLECTOMY  1957   adenoids removed   Family History  Problem Relation Age of Onset   Hypertension Mother    Hearing loss Mother    Prostate cancer Father 40   Cancer Father    Hypertension Father    Stroke Father    Colon polyps Sister    Colon cancer Maternal Grandmother        dx in her 49s   Diabetes Maternal Grandmother    Heart disease Maternal Grandfather    Hearing loss Maternal Grandfather    Colon cancer Paternal Grandmother    Heart  disease Paternal Grandfather    Breast cancer Paternal Aunt 88   Colon cancer Paternal Aunt        dx in her 51s   Melanoma Cousin 100   Breast cancer Cousin 61       multiple cousins   Colon polyps Cousin    Breast cancer Cousin 38   Breast cancer Cousin        father's maternal 1st cousin   Breast cancer Cousin        Father's first cousin's daughter   Cancer Other        maternal great uncle with Cancer - NOS   Cancer Other        maternal great grandmother with either colon or stomach cancer   Breast cancer Other        fathers maternal aunt   Social History   Occupational History   Occupation: PARALEGAL    Employer: [employer]  Tobacco Use   Smoking status: Former    Current packs/day: 0.00    Average packs/day: 1.5 packs/day for 11.0 years (16.5 ttl pk-yrs)    Types: Cigarettes    Start date: 10/05/1965    Quit date: 10/05/1976    Years since quitting: 47.9    Passive exposure: Past   Smokeless tobacco: Never  Vaping Use   Vaping status: Never Used  Substance and Sexual Activity   Alcohol use: Not Currently   Drug use: No   Sexual activity: Not Currently   Tobacco Counseling Counseling given: No  SDOH Screenings   Food Insecurity: No Food Insecurity (08/24/2024)  Housing: Low Risk  (08/24/2024)  Transportation Needs: No Transportation Needs (08/24/2024)  Utilities: Not At Risk (08/24/2024)  Alcohol Screen: Low Risk  (08/24/2024)  Depression (PHQ2-9): Low Risk  (08/24/2024)  Financial Resource Strain: Low Risk  (08/24/2024)  Physical Activity: Inactive (08/24/2024)  Social Connections: Moderately Isolated (08/24/2024)  Stress: No Stress Concern Present (08/24/2024)  Tobacco Use: Medium Risk (08/24/2024)  Health Literacy: Adequate Health Literacy (08/24/2024)   See flowsheets for full screening details  Depression Screen PHQ 2 & 9 Depression Scale- Over the past 2 weeks, how often have you been bothered by any of the following problems? Little  interest or pleasure in doing things: 1 Feeling down, depressed, or hopeless (PHQ Adolescent also includes...irritable): 0 PHQ-2 Total Score: 1     Goals Addressed   None    Visit info / Clinical Intake: Medicare Wellness Visit Type:: Subsequent Annual Wellness Visit Persons participating in visit:: patient Medicare Wellness Visit Mode:: In-person (required for WTM) Information given by:: patient Interpreter Needed?: No Pre-visit prep was completed: yes AWV questionnaire completed by patient prior to visit?: yes Date:: 08/21/24 Living arrangements:: (!) lives alone Patient's Overall Health Status Rating: good Typical amount of pain: none Does pain affect daily life?: no Are  you currently prescribed opioids?: no  Dietary Habits and Nutritional Risks How many meals a day?: 2 Eats fruit and vegetables daily?: yes Most meals are obtained by: eating out; preparing own meals In the last 2 weeks, have you had any of the following?: none Diabetic:: no  Functional Status Activities of Daily Living (to include ambulation/medication): Independent Ambulation: Independent Medication Administration: Independent Home Management: Needs assistance (comment) Manage your own finances?: yes Primary transportation is: driving Concerns about vision?: no *vision screening is required for WTM* Concerns about hearing?: no  Fall Screening Falls in the past year?: 0 Number of falls in past year: 0 Was there an injury with Fall?: 0 Fall Risk Category Calculator: 0 Patient Fall Risk Level: Low Fall Risk  Fall Risk Patient at Risk for Falls Due to: No Fall Risks Fall risk Follow up: Falls prevention discussed; Education provided; Falls evaluation completed  Home and Transportation Safety: All rugs have non-skid backing?: yes All stairs or steps have railings?: yes Grab bars in the bathtub or shower?: yes Have non-skid surface in bathtub or shower?: yes Good home lighting?: yes Regular  seat belt use?: yes Hospital stays in the last year:: no  Cognitive Assessment Difficulty concentrating, remembering, or making decisions? : no Will 6CIT or Mini Cog be Completed: yes 6CIT or Mini Cog Declined: patient alert, oriented, able to answer questions appropriately and recall recent events What year is it?: 0 points What month is it?: 0 points About what time is it?: 0 points Count backwards from 20 to 1: 0 points Say the months of the year in reverse: 0 points Repeat the address phrase from earlier: 0 points 6 CIT Score: 0 points  Advance Directives (For Healthcare) Does Patient Have a Medical Advance Directive?: Yes Does patient want to make changes to medical advance directive?: No - Patient declined Type of Advance Directive: Living will; Healthcare Power of Attorney Copy of Healthcare Power of Attorney in Chart?: No - copy requested Copy of Living Will in Chart?: No - copy requested Would patient like information on creating a medical advance directive?: No - Patient declined  Reviewed/Updated  Reviewed/Updated: Reviewed All (Medical, Surgical, Family, Medications, Allergies, Care Teams, Patient Goals)        Objective:    Today's Vitals   08/24/24 1046  BP: 138/80  Pulse: 68  SpO2: 96%  Weight: 182 lb (82.6 kg)  Height: 5' 4 (1.626 m)  PainSc: 0-No pain   Body mass index is 31.24 kg/m.  Current Medications (verified) Outpatient Encounter Medications as of 08/24/2024  Medication Sig   alendronate  (FOSAMAX ) 70 MG tablet Take 1 tablet (70 mg total) by mouth once a week. Take with a full glass of water on an empty stomach.   Cholecalciferol (VITAMIN D ) 50 MCG (2000 UT) tablet Take 2,000 Units by mouth daily.   etanercept  (ENBREL  MINI) 50 MG/ML injection Inject 1 mL (50 mg total) into the skin once a week.   famotidine (PEPCID) 20 MG tablet Take 20 mg by mouth at bedtime.   fluticasone (FLONASE) 50 MCG/ACT nasal spray Place into the nose 3 (three) times a  week.   folic acid  (FOLVITE ) 1 MG tablet Take 2 tablets (2 mg total) by mouth daily.   hydrocortisone (CORTEF) 10 MG tablet 3 tablets between 6AM to 8AM, 1 tablet around 2PM Orally Twice a day   loratadine (CLARITIN) 10 MG tablet Take 10 mg by mouth daily.   LORazepam  (ATIVAN ) 1 MG tablet TAKE 1 TABLET BY MOUTH  EVERY DAY AT BEDTIME AS NEEDED   losartan  (COZAAR ) 100 MG tablet Take 1 tablet (100 mg total) by mouth daily.   methotrexate  50 MG/2ML injection Inject 0.8 mLs (20 mg total) into the skin once a week.   Multiple Vitamin (MULTIVITAMIN WITH MINERALS) TABS tablet Take 1 tablet by mouth daily.   oxyCODONE  (OXY IR/ROXICODONE ) 5 MG immediate release tablet one-quarter to one-half tablet as needed Orally up to once a day   Polyethyl Glycol-Propyl Glycol (SYSTANE) 0.4-0.3 % GEL ophthalmic gel Place 1 application  into both eyes in the morning and at bedtime.   predniSONE  (DELTASONE ) 10 MG tablet Take 10 mg by mouth daily with breakfast.   rosuvastatin  (CRESTOR ) 5 MG tablet TAKE 1 TABLET BY MOUTH 3 TIMES A WEEK WITH SUPPER   TUBERCULIN SYR 1CC/27GX1/2 (B-D TB SYRINGE 1CC/27GX1/2) 27G X 1/2 1 ML MISC Inject 1 Syringe into the skin once a week.   [DISCONTINUED] ferrous sulfate 325 (65 FE) MG EC tablet Take 325 mg by mouth 2 (two) times daily.   [DISCONTINUED] HYDROcodone  bit-homatropine (HYCODAN) 5-1.5 MG/5ML syrup Take 5 mLs by mouth every 8 (eight) hours as needed for cough. Patient has chronic cough with hx of sarcoidosis   No facility-administered encounter medications on file as of 08/24/2024.   Hearing/Vision screen No results found. Immunizations and Health Maintenance Health Maintenance  Topic Date Due   Medicare Annual Wellness (AWV)  08/22/2024   Mammogram  12/06/2024   Bone Density Scan  01/07/2026   DTaP/Tdap/Td (3 - Td or Tdap) 03/19/2030   Colonoscopy  11/14/2031   Pneumococcal Vaccine: 50+ Years  Completed   Influenza Vaccine  Completed   Meningococcal B Vaccine  Aged Out    COVID-19 Vaccine  Discontinued   Hepatitis C Screening  Discontinued   Zoster Vaccines- Shingrix  Discontinued        Assessment/Plan:  This is a routine wellness examination for Magaly.  Patient Care Team: Perri Ronal PARAS, MD as PCP - General (Internal Medicine) Mealor, Eulas BRAVO, MD as Consulting Physician (Cardiology) Dolphus Reiter, MD as Consulting Physician (Rheumatology)  I have personally reviewed and noted the following in the patient's chart:   Medical and social history Use of alcohol, tobacco or illicit drugs  Current medications and supplements including opioid prescriptions. Functional ability and status Nutritional status Physical activity Advanced directives List of other physicians Hospitalizations, surgeries, and ER visits in previous 12 months Vitals Screenings to include cognitive, depression, and falls Referrals and appointments   In addition, I have reviewed and discussed with patient certain preventive protocols, quality metrics, and best practice recommendations. A written personalized care plan for preventive services as well as general preventive health recommendations were provided to patient.   Bernardine Langworthy Zelda, CMA   08/24/2024     After Visit Summary: (In Person-Printed) AVS printed and given to the patient    She is also here for health maintenance exam. Has complex medical history described above.  History of SI joint pain on the right side treated by injection at [facility] orthopedics.  Diagnosed with seronegative rheumatoid arthritis by Dr. Dolphus 08/04/2023.  Was started on Plaquenil  and initially.  History of pulmonary sarcoidosis and has been followed by Dr. Alvenia at [facility].  History of lumbosacral back pain with moderate right foraminal stenosis L4-L5.  Also has L5-S1 bilateral subarticular recess narrowing the crowns and S1 nerve roots and moderate right foraminal narrowing.  Has been diagnosed by  Dr. Conchetta Oncologist with MGUS.  History of lumbar disc disease status post lumbar disc surgery by Dr. Lang in 1997 and has some chronic numbness in the left foot from that surgery.  Remote history of ovarian cancer 1C grade 3 clear-cell status post debulking surgery March 2011 by Dr. Jerrol.  Had total abdominal hysterectomy, BSO, bilateral pelvic node dissection.  Had aortic node sampling, omentectomy, appendectomy and biopsies.  Lymph nodes were negative for malignancy.  Symptoms started in February 2011 with abdominal gas and bloating.  CT of chest showed tiny nodules that in retrospect could have been sarcoidosis.  Additional past medical history: Ectopic pregnancy 1980, benign left breast biopsy 1993, C-sections 1992 and 1995.  Ganglion cyst removed from wrist in 1975, tonsillectomy 1957, brow lift in 2008.  Colonoscopy done 2023 by Dr. Eda.  Had nonbleeding internal hemorrhoids, diverticulosis in sigmoid colon and four 1 to 3 mm polyps in the ascending colon.  Pathology showed 4 tubular adenomas and repeat study recommended in 2026.  Social history: Divorced.  Has 2 adult daughters.  Does not smoke but did smoke from the ages of 30 through 52.  Social alcohol consumption.  She previously worked as an production designer, theatre/television/film in engineer, structural.  Family history: Father died at age 96 with prostate cancer.  Brother died at age 46 of an AVM rupture.  1 sister in good health.   Vital signs reviewed.  Skin: Warm and dry.  No cervical adenopathy appreciated.  No thyromegaly.  No carotid bruits.  Chest clear.  Cardiac exam: Regular rate and rhythm without ectopy.  Abdomen no hepatosplenomegaly masses or tenderness.  Does have pitting edema both lower extremities.  Neurological exam: Affect is normal.  No gross focal deficits on brief neurological exam.  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  History of sarcoidosis followed at [facility] and is currently stable  History of ovarian cancer  History of tubular adenomas on colonoscopy with repeat study needed in 2026  Allergic rhinitis treated with Claritin 10 mg daily  Hypertension treated with losartan  and furosemide   Dependent edema treated with furosemide   GE reflux treated with Pepcid  Osteopenia treated with Fosamax   Mild hyperlipidemia treated with low-dose Crestor  5 mg daily  Anxiety treated with as needed Ativan   Plan: She will continue close follow-up with her consultants.  Continue current medications.  Return here in 1 year or as needed.  Refilled Lasix  for dependent edema.

## 2024-09-06 DIAGNOSIS — Z961 Presence of intraocular lens: Secondary | ICD-10-CM | POA: Diagnosis not present

## 2024-09-06 DIAGNOSIS — H26493 Other secondary cataract, bilateral: Secondary | ICD-10-CM | POA: Diagnosis not present

## 2024-09-14 ENCOUNTER — Other Ambulatory Visit: Payer: Self-pay | Admitting: Internal Medicine

## 2024-09-15 NOTE — Telephone Encounter (Signed)
 done

## 2024-09-18 ENCOUNTER — Other Ambulatory Visit: Payer: Self-pay

## 2024-09-18 ENCOUNTER — Emergency Department (HOSPITAL_COMMUNITY)

## 2024-09-18 ENCOUNTER — Ambulatory Visit: Admitting: Internal Medicine

## 2024-09-18 ENCOUNTER — Inpatient Hospital Stay (HOSPITAL_COMMUNITY)
Admission: EM | Admit: 2024-09-18 | Discharge: 2024-09-20 | DRG: 176 | Disposition: A | Attending: Internal Medicine | Admitting: Internal Medicine

## 2024-09-18 ENCOUNTER — Encounter: Payer: Self-pay | Admitting: Internal Medicine

## 2024-09-18 ENCOUNTER — Encounter (HOSPITAL_COMMUNITY): Payer: Self-pay

## 2024-09-18 VITALS — BP 180/90 | HR 86 | Temp 97.9°F | Ht 64.5 in

## 2024-09-18 DIAGNOSIS — K219 Gastro-esophageal reflux disease without esophagitis: Secondary | ICD-10-CM

## 2024-09-18 DIAGNOSIS — D84821 Immunodeficiency due to drugs: Secondary | ICD-10-CM | POA: Diagnosis present

## 2024-09-18 DIAGNOSIS — Z822 Family history of deafness and hearing loss: Secondary | ICD-10-CM

## 2024-09-18 DIAGNOSIS — R918 Other nonspecific abnormal finding of lung field: Secondary | ICD-10-CM | POA: Diagnosis present

## 2024-09-18 DIAGNOSIS — D869 Sarcoidosis, unspecified: Secondary | ICD-10-CM | POA: Diagnosis present

## 2024-09-18 DIAGNOSIS — I2699 Other pulmonary embolism without acute cor pulmonale: Secondary | ICD-10-CM | POA: Diagnosis present

## 2024-09-18 DIAGNOSIS — R600 Localized edema: Secondary | ICD-10-CM | POA: Diagnosis not present

## 2024-09-18 DIAGNOSIS — Z1152 Encounter for screening for COVID-19: Secondary | ICD-10-CM

## 2024-09-18 DIAGNOSIS — G4709 Other insomnia: Secondary | ICD-10-CM | POA: Diagnosis not present

## 2024-09-18 DIAGNOSIS — M0600 Rheumatoid arthritis without rheumatoid factor, unspecified site: Secondary | ICD-10-CM | POA: Diagnosis present

## 2024-09-18 DIAGNOSIS — Z833 Family history of diabetes mellitus: Secondary | ICD-10-CM | POA: Diagnosis not present

## 2024-09-18 DIAGNOSIS — Z7983 Long term (current) use of bisphosphonates: Secondary | ICD-10-CM | POA: Diagnosis not present

## 2024-09-18 DIAGNOSIS — Z950 Presence of cardiac pacemaker: Secondary | ICD-10-CM

## 2024-09-18 DIAGNOSIS — G4700 Insomnia, unspecified: Secondary | ICD-10-CM

## 2024-09-18 DIAGNOSIS — I10 Essential (primary) hypertension: Secondary | ICD-10-CM | POA: Diagnosis present

## 2024-09-18 DIAGNOSIS — Z881 Allergy status to other antibiotic agents status: Secondary | ICD-10-CM

## 2024-09-18 DIAGNOSIS — Z8249 Family history of ischemic heart disease and other diseases of the circulatory system: Secondary | ICD-10-CM

## 2024-09-18 DIAGNOSIS — G4733 Obstructive sleep apnea (adult) (pediatric): Secondary | ICD-10-CM | POA: Diagnosis present

## 2024-09-18 DIAGNOSIS — D472 Monoclonal gammopathy: Secondary | ICD-10-CM | POA: Diagnosis present

## 2024-09-18 DIAGNOSIS — E2740 Unspecified adrenocortical insufficiency: Secondary | ICD-10-CM | POA: Diagnosis present

## 2024-09-18 DIAGNOSIS — I495 Sick sinus syndrome: Secondary | ICD-10-CM | POA: Diagnosis present

## 2024-09-18 DIAGNOSIS — R0602 Shortness of breath: Secondary | ICD-10-CM | POA: Diagnosis not present

## 2024-09-18 DIAGNOSIS — R911 Solitary pulmonary nodule: Secondary | ICD-10-CM | POA: Diagnosis present

## 2024-09-18 DIAGNOSIS — I2694 Multiple subsegmental pulmonary emboli without acute cor pulmonale: Principal | ICD-10-CM | POA: Diagnosis present

## 2024-09-18 DIAGNOSIS — I82431 Acute embolism and thrombosis of right popliteal vein: Secondary | ICD-10-CM | POA: Diagnosis present

## 2024-09-18 DIAGNOSIS — Z888 Allergy status to other drugs, medicaments and biological substances status: Secondary | ICD-10-CM

## 2024-09-18 DIAGNOSIS — I2489 Other forms of acute ischemic heart disease: Secondary | ICD-10-CM | POA: Diagnosis present

## 2024-09-18 DIAGNOSIS — Z7952 Long term (current) use of systemic steroids: Secondary | ICD-10-CM

## 2024-09-18 DIAGNOSIS — R7989 Other specified abnormal findings of blood chemistry: Secondary | ICD-10-CM | POA: Diagnosis not present

## 2024-09-18 DIAGNOSIS — J9601 Acute respiratory failure with hypoxia: Secondary | ICD-10-CM

## 2024-09-18 DIAGNOSIS — Z9221 Personal history of antineoplastic chemotherapy: Secondary | ICD-10-CM | POA: Diagnosis not present

## 2024-09-18 DIAGNOSIS — Z7951 Long term (current) use of inhaled steroids: Secondary | ICD-10-CM | POA: Diagnosis not present

## 2024-09-18 DIAGNOSIS — I2609 Other pulmonary embolism with acute cor pulmonale: Secondary | ICD-10-CM | POA: Diagnosis not present

## 2024-09-18 DIAGNOSIS — Z87891 Personal history of nicotine dependence: Secondary | ICD-10-CM | POA: Diagnosis not present

## 2024-09-18 DIAGNOSIS — Z8543 Personal history of malignant neoplasm of ovary: Secondary | ICD-10-CM

## 2024-09-18 DIAGNOSIS — Z79899 Other long term (current) drug therapy: Secondary | ICD-10-CM

## 2024-09-18 DIAGNOSIS — D860 Sarcoidosis of lung: Secondary | ICD-10-CM

## 2024-09-18 DIAGNOSIS — Z90710 Acquired absence of both cervix and uterus: Secondary | ICD-10-CM

## 2024-09-18 DIAGNOSIS — E785 Hyperlipidemia, unspecified: Secondary | ICD-10-CM | POA: Diagnosis present

## 2024-09-18 DIAGNOSIS — Z91040 Latex allergy status: Secondary | ICD-10-CM

## 2024-09-18 DIAGNOSIS — G4730 Sleep apnea, unspecified: Secondary | ICD-10-CM | POA: Diagnosis present

## 2024-09-18 DIAGNOSIS — R0682 Tachypnea, not elsewhere classified: Secondary | ICD-10-CM

## 2024-09-18 DIAGNOSIS — M069 Rheumatoid arthritis, unspecified: Secondary | ICD-10-CM

## 2024-09-18 DIAGNOSIS — R0902 Hypoxemia: Secondary | ICD-10-CM

## 2024-09-18 LAB — COMPREHENSIVE METABOLIC PANEL WITH GFR
ALT: 18 U/L (ref 0–44)
AST: 35 U/L (ref 15–41)
Albumin: 3.1 g/dL — ABNORMAL LOW (ref 3.5–5.0)
Alkaline Phosphatase: 58 U/L (ref 38–126)
Anion gap: 12 (ref 5–15)
BUN: 25 mg/dL — ABNORMAL HIGH (ref 8–23)
CO2: 27 mmol/L (ref 22–32)
Calcium: 8.6 mg/dL — ABNORMAL LOW (ref 8.9–10.3)
Chloride: 105 mmol/L (ref 98–111)
Creatinine, Ser: 1.09 mg/dL — ABNORMAL HIGH (ref 0.44–1.00)
GFR, Estimated: 54 mL/min — ABNORMAL LOW (ref >60)
Glucose, Bld: 117 mg/dL — ABNORMAL HIGH (ref 70–99)
Potassium: 4.4 mmol/L (ref 3.5–5.1)
Sodium: 144 mmol/L (ref 135–145)
Total Bilirubin: 1.3 mg/dL — ABNORMAL HIGH (ref 0.0–1.2)
Total Protein: 5.9 g/dL — ABNORMAL LOW (ref 6.5–8.1)

## 2024-09-18 LAB — CBC
HCT: 39.8 % (ref 36.0–46.0)
Hemoglobin: 12.2 g/dL (ref 12.0–15.0)
MCH: 28.4 pg (ref 26.0–34.0)
MCHC: 30.7 g/dL (ref 30.0–36.0)
MCV: 92.6 fL (ref 80.0–100.0)
Platelets: 259 K/uL (ref 150–400)
RBC: 4.3 MIL/uL (ref 3.87–5.11)
RDW: 18.6 % — ABNORMAL HIGH (ref 11.5–15.5)
WBC: 12.9 K/uL — ABNORMAL HIGH (ref 4.0–10.5)
nRBC: 0 % (ref 0.0–0.2)

## 2024-09-18 LAB — BRAIN NATRIURETIC PEPTIDE: B Natriuretic Peptide: 687.1 pg/mL — ABNORMAL HIGH (ref 0.0–100.0)

## 2024-09-18 LAB — RESP PANEL BY RT-PCR (RSV, FLU A&B, COVID)  RVPGX2
Influenza A by PCR: NEGATIVE
Influenza B by PCR: NEGATIVE
Resp Syncytial Virus by PCR: NEGATIVE
SARS Coronavirus 2 by RT PCR: NEGATIVE

## 2024-09-18 LAB — TROPONIN I (HIGH SENSITIVITY)
Troponin I (High Sensitivity): 290 ng/L (ref <18)
Troponin I (High Sensitivity): 311 ng/L (ref <18)

## 2024-09-18 MED ORDER — HEPARIN (PORCINE) 25000 UT/250ML-% IV SOLN
1450.0000 [IU]/h | INTRAVENOUS | Status: DC
  Administered 2024-09-18: 22:00:00 1250 [IU]/h via INTRAVENOUS
  Administered 2024-09-19: 14:00:00 1350 [IU]/h via INTRAVENOUS
  Filled 2024-09-18 (×2): qty 250

## 2024-09-18 MED ORDER — FUROSEMIDE 10 MG/ML IJ SOLN
20.0000 mg | Freq: Once | INTRAMUSCULAR | Status: AC
  Administered 2024-09-18: 20:00:00 20 mg via INTRAVENOUS
  Filled 2024-09-18: qty 2

## 2024-09-18 MED ORDER — HEPARIN BOLUS VIA INFUSION
4500.0000 [IU] | Freq: Once | INTRAVENOUS | Status: AC
  Administered 2024-09-18: 22:00:00 4500 [IU] via INTRAVENOUS
  Filled 2024-09-18: qty 4500

## 2024-09-18 MED ORDER — IOHEXOL 350 MG/ML SOLN
75.0000 mL | Freq: Once | INTRAVENOUS | Status: AC | PRN
  Administered 2024-09-18: 21:00:00 75 mL via INTRAVENOUS

## 2024-09-18 NOTE — Patient Instructions (Addendum)
 Patient will be transported by private vehicle to Emergency Dept at [facility] for evaluation of respiratory failure.

## 2024-09-18 NOTE — Telephone Encounter (Signed)
 Patient calling stating was seen in Urgent care over the weekend due to SOB and was told she had Reactive airway disease.  Asking for an appointment to come see Bellinger.  I have nothing soon for her.  Please call her to advise.  Thanks Clarita  CB # [phone]

## 2024-09-18 NOTE — ED Provider Notes (Signed)
 [place] EMERGENCY DEPARTMENT AT [facility] Provider Note   CSN: [id number] Arrival date & time: 09/18/24  1717     Patient presents with: Shortness of Breath   Julia Solis is a 73 y.o. female here for shortness of breath.  Noted she has had shortness of breath over the last 2 weeks.  Initially attributed to a severe viral infection in August 2025.  Over the last week she has had worsening shortness of breath is not able to get up and do her ADLs without feeling breathless and having to stop.  She was seen by urgent care on Friday, 3 days PTA.  Was tachycardic however not hypoxic.  She had a chest x-ray which did not show any significant abnormality.  She was given nebulizer as well as Decadron  there and sent home on antibiotics.  She is on chronic steroids given her RA and she has known sarcoid followed by [facility].  She denies any overt chest pain however does note chest tightness specifically when she is up and doing anything.  States she cannot carry a conversation without pausing to take a deep breath.  She has some intermittent chronic bilateral lower extremity edema which she has Lasix  at home for however has not been taking this as she notes she has not had any lower extremity edema.  No history of PE or DVT.  Does have remote history of ovarian cancer.  Has a pacemaker for symptomatic bradycardia.  No fever, nausea, vomiting, back pain, abdominal pain.  No history of PE or DVT.  She has some rhinorrhea and postnasal drip.  Seen by PCP noted to be hypoxic with ambulating was sent here for a CT scan to rule out occult pneumonia or pulmonary embolism.  No PND or orthopnea   HPI     Prior to Admission medications  Medication Sig Start Date End Date Taking? Authorizing Provider  albuterol  (VENTOLIN  HFA) 108 (90 Base) MCG/ACT inhaler Inhale 2 puffs into the lungs every 4 (four) hours as needed for shortness of breath. 09/15/24 10/15/24  [provider]   alendronate  (FOSAMAX ) 70 MG tablet Take 1 tablet (70 mg total) by mouth once a week. Take with a full glass of water on an empty stomach. 07/03/24   Deveshwar, Maya, MD  BREO ELLIPTA 50-25 MCG/INH AEPB Inhale 1 puff into the lungs daily. 09/15/24 10/15/24  [provider]  Cholecalciferol (VITAMIN D ) 50 MCG (2000 UT) tablet Take 2,000 Units by mouth daily.    [provider]  doxycycline  (VIBRA -TABS) 100 MG tablet Take 100 mg by mouth daily. 09/15/24 09/22/24  [provider]  etanercept  (ENBREL  MINI) 50 MG/ML injection Inject 1 mL (50 mg total) into the skin once a week. 08/09/24   Cheryl Waddell HERO, PA-C  famotidine  (PEPCID ) 20 MG tablet Take 20 mg by mouth at bedtime.    [provider]  fluticasone (FLONASE) 50 MCG/ACT nasal spray Place into the nose 3 (three) times a week.    [provider]  folic acid  (FOLVITE ) 1 MG tablet Take 2 tablets (2 mg total) by mouth daily. 03/21/24   Cheryl Waddell HERO, PA-C  furosemide  (LASIX ) 20 MG tablet Take 1 tablet (20 mg total) by mouth daily. 08/24/24   Perri Ronal PARAS, MD  hydrocortisone  (CORTEF ) 10 MG tablet 3 tablets between 6AM to 8AM, 1 tablet around 2PM Orally Twice a day 06/28/24   [provider]  loratadine (CLARITIN) 10 MG tablet Take 10 mg by  mouth daily.    [provider]  LORazepam  (ATIVAN ) 1 MG tablet TAKE 1 TABLET BY MOUTH EVERY DAY AT BEDTIME AS NEEDED 04/18/24   Perri Ronal PARAS, MD  losartan  (COZAAR ) 100 MG tablet TAKE 1 TABLET BY MOUTH EVERY DAY 09/14/24   Perri Ronal PARAS, MD  methotrexate  50 MG/2ML injection Inject 0.8 mLs (20 mg total) into the skin once a week. 07/03/24   Dolphus Reiter, MD  Multiple Vitamin (MULTIVITAMIN WITH MINERALS) TABS tablet Take 1 tablet by mouth daily.    [provider]  oxyCODONE  (OXY IR/ROXICODONE ) 5 MG immediate release tablet one-quarter to one-half tablet as needed Orally up to once a day    [provider]  Polyethyl Glycol-Propyl  Glycol (SYSTANE) 0.4-0.3 % GEL ophthalmic gel Place 1 application  into both eyes in the morning and at bedtime.    [provider]  predniSONE  (DELTASONE ) 10 MG tablet Take 10 mg by mouth daily with breakfast.    [provider]  rosuvastatin  (CRESTOR ) 5 MG tablet TAKE 1 TABLET BY MOUTH 3 TIMES A WEEK WITH SUPPER 04/18/24   Baxley, Ronal PARAS, MD  TUBERCULIN SYR 1CC/27GX1/2 (B-D TB SYRINGE 1CC/27GX1/2) 27G X 1/2 1 ML MISC Inject 1 Syringe into the skin once a week. 04/26/24   Dolphus Reiter, MD    Allergies: Augmentin  [amoxicillin -pot clavulanate], Flomax [tamsulosin hcl], Latex, Tramadol , and Ciprofloxacin    Review of Systems  Constitutional:  Positive for activity change and fatigue.  HENT:  Positive for congestion, postnasal drip and rhinorrhea. Negative for sore throat, trouble swallowing and voice change.   Respiratory:  Positive for cough, chest tightness and shortness of breath.   Cardiovascular: Negative.   Gastrointestinal: Negative.   Genitourinary: Negative.   Musculoskeletal: Negative.   Skin: Negative.   Neurological: Negative.   All other systems reviewed and are negative.   Updated Vital Signs BP (!) 208/108   Pulse 79   Temp 97.9 F (36.6 C) (Oral)   Resp 18   Wt 82 kg   SpO2 99%   BMI 30.55 kg/m   Physical Exam Vitals and nursing note reviewed.  Constitutional:      General: She is not in acute distress.    Appearance: She is well-developed. She is not ill-appearing, toxic-appearing or diaphoretic.  HENT:     Head: Normocephalic and atraumatic.  Eyes:     Pupils: Pupils are equal, round, and reactive to light.  Cardiovascular:     Rate and Rhythm: Normal rate.     Pulses: Normal pulses.          Radial pulses are 2+ on the right side and 2+ on the left side.       Dorsalis pedis pulses are 2+ on the right side and 2+ on the left side.     Heart sounds: Normal heart sounds.  Pulmonary:     Effort: No respiratory distress.     Breath  sounds: Decreased breath sounds present.     Comments: Lungs clear bilaterally however extremely dyspneic in conversation pausing every 4-5 words to take a deep breath. Chest:     Comments: Nontender chest wall Abdominal:     General: Bowel sounds are normal. There is no distension.     Palpations: Abdomen is soft.  Musculoskeletal:        General: Normal range of motion.     Cervical back: Normal range of motion.     Right lower leg: No tenderness. No edema.  Left lower leg: No tenderness. No edema.     Comments: No bony tenderness, compartment soft, trace pitting edema to shins bilaterally  Skin:    General: Skin is warm and dry.  Neurological:     General: No focal deficit present.     Mental Status: She is alert.  Psychiatric:        Mood and Affect: Mood normal.     (all labs ordered are listed, but only abnormal results are displayed) Labs Reviewed  COMPREHENSIVE METABOLIC PANEL WITH GFR - Abnormal; Notable for the following components:      Result Value   Glucose, Bld 117 (*)    BUN 25 (*)    Creatinine, Ser 1.09 (*)    Calcium  8.6 (*)    Total Protein 5.9 (*)    Albumin 3.1 (*)    Total Bilirubin 1.3 (*)    GFR, Estimated 54 (*)    All other components within normal limits  CBC - Abnormal; Notable for the following components:   WBC 12.9 (*)    RDW 18.6 (*)    All other components within normal limits  BRAIN NATRIURETIC PEPTIDE - Abnormal; Notable for the following components:   B Natriuretic Peptide 687.1 (*)    All other components within normal limits  TROPONIN I (HIGH SENSITIVITY) - Abnormal; Notable for the following components:   Troponin I (High Sensitivity) 290 (*)    All other components within normal limits  TROPONIN I (HIGH SENSITIVITY) - Abnormal; Notable for the following components:   Troponin I (High Sensitivity) 311 (*)    All other components within normal limits  RESP PANEL BY RT-PCR (RSV, FLU A&B, COVID)  RVPGX2  HEPARIN  LEVEL  (UNFRACTIONATED)  CBC    EKG: EKG Interpretation Date/Time:  Monday September 18 2024 17:37:09 EST Ventricular Rate:  85 PR Interval:  138 QRS Duration:  86 QT Interval:  358 QTC Calculation: 426 R Axis:   107  Text Interpretation: Normal sinus rhythm Rightward axis Nonspecific T wave abnormality Abnormal ECG When compared with ECG of 27-Mar-2024 14:24, PREVIOUS ECG IS PRESENT Confirmed by Jerrol Agent ([id number]) on 09/18/2024 7:34:28 PM  Radiology: CT Angio Chest PE W and/or Wo Contrast Result Date: 09/18/2024 EXAM: CTA of the Chest with contrast for PE 09/18/2024 08:46:26 PM TECHNIQUE: CTA of the chest was performed without and with the administration of 75 mL of intravenous iohexol  (OMNIPAQUE ) 350 MG/ML injection. Multiplanar reformatted images are provided for review. MIP images are provided for review. Automated exposure control, iterative reconstruction, and/or weight based adjustment of the mA/kV was utilized to reduce the radiation dose to as low as reasonably achievable. COMPARISON: CT chest 06/02/2011. CLINICAL HISTORY: Pulmonary embolism (PE) suspected, high prob. FINDINGS: PULMONARY ARTERIES: Pulmonary arteries are adequately opacified for evaluation. Pulmonary emboli are seen within segmental branches of the bilateral upper lobes as well as within segmental and subsegmental branches of the bilateral lower lobes and right middle lobe. Main pulmonary artery is enlarged. MEDIASTINUM: The heart is moderately enlarged, a new finding. Left sided pacemaker is present. There is no acute abnormality of the thoracic aorta. The thyroid  gland is heterogeneous diffusely. Isthmus nodule measures 17 mm, new from prior. There is a 3.3 x 1.7 cm low density nodule in the right paratracheal region which has increased in size. LYMPH NODES: There are no new enlarged hilar or axillary lymph nodes identified. LUNGS AND PLEURA: Chronic appearing pleural parenchymal opacities with bronchiectasis in the bilateral  upper lobes superior unchanged from  2012. There is a new subsolid nodular density in the right lower lobe measuring 11 mm (image 6/99). Ground glass slightly nodular area measures 12 mm in the right lower lobe (image 6/104) which is new from prior. There is no pleural effusion or pneumothorax. UPPER ABDOMEN: There is a small hiatal hernia. There is a cyst in the left lobe of the liver measuring 17 mm. SOFT TISSUES AND BONES: No acute bone or soft tissue abnormality. IMPRESSION: 1. Pulmonary emboli within segmental branches of the bilateral upper lobes as well as within segmental and subsegmental branches of the bilateral lower lobes and right middle lobe. 2. New subsolid pulmonary nodules in the right lower lobe measuring 11 mm and 12 mm; recommend CT at 3-6 months to confirm persistence, and if unchanged with solid component 5 mm, then annual CT for 5 years, as per Fleischner Society Guidelines. 3. Moderate cardiomegaly. 4. Enlarged main pulmonary artery. 5. Enlarging right paratracheal lymph node. 6. Stable chronic parenchymal opacities in the bilateral upper lobes. Electronically signed by: Greig Pique MD 09/18/2024 09:12 PM EST RP Workstation: [id number]     .Critical Care  Performed by: Edie Rosebud LABOR, PA-C Authorized by: Edie Rosebud LABOR, PA-C   Critical care provider statement:    Critical care time (minutes):  35   Critical care was necessary to treat or prevent imminent or life-threatening deterioration of the following conditions:  Respiratory failure, circulatory failure and cardiac failure   Critical care was time spent personally by me on the following activities:  Development of treatment plan with patient or surrogate, discussions with consultants, evaluation of patient's response to treatment, examination of patient, ordering and review of laboratory studies, ordering and review of radiographic studies, ordering and performing treatments and interventions, pulse oximetry,  re-evaluation of patient's condition and review of old charts    Medications Ordered in the ED  heparin  ADULT infusion 100 units/mL (25000 units/250mL) (1,250 Units/hr Intravenous New Bag/Given 09/18/24 2136)  furosemide  (LASIX ) injection 20 mg (20 mg Intravenous Given 09/18/24 1935)  iohexol  (OMNIPAQUE ) 350 MG/ML injection 75 mL (75 mLs Intravenous Contrast Given 09/18/24 2047)  heparin  bolus via infusion 4,500 Units (4,500 Units Intravenous Bolus from Bag 09/18/24 5130)    73 year old multiple medical comorbidities here for evaluation of shortness of breath.  Ongoing over the last 2 weeks after viral infection however worsening over the last 5 days.  Seen initially by urgent care, negative chest x-ray given Decadron , breathing treatment, antibiotics, mildly improved for 24 hours however then worsened.  She is on chronic steroids due to RA and sarcoid.  Was seen by PCP noted to be hypoxic with ambulation at PCP subsequently sent here.  Patient has some trace pitting edema to lower extremities however no PND orthopnea.  She denies any prior history of heart failure however states she is on as needed 20 mg Lasix  for lower extremity edema.  No history of PE or DVT.  She is some congestion and rhinorrhea.  Some chest tightness without overt pain.  Patient appears very dyspneic in room specifically with talking having to stop every 5 words or so to take a breath however no hypoxia.  Will plan on labs, imaging, reassess  Labs and imaging personally viewed interpreted:  CBC leukocytosis 12.9 Metabolic panel creatinine 1.09-recent 0.7 BNP 687 EKG without ischemic changes Trop 290--311  Patient reassessed.  Given elevated BNP will plan on IV Lasix  here and CTA.  CTA with bilateral PE.  Discussed with Dr. Layman with PCCM.  Patient  is hemodynamically stable can go to the medicine team  Discussed with patient, family at bedside.  She was already started on heparin .  Will plan for medicine  admit.  Discussed with Dr. Charlton with medicine who is agreeable to evaluate patient for admission  The patient appears reasonably stabilized for admission considering the current resources, flow, and capabilities available in the ED at this time, and I doubt any other [facility] requiring further screening and/or treatment in the ED prior to admission.     Clinical Course as of 09/18/24 2339  Mon Sep 18, 2024  2322 Dr. Charlton with medicine for admission [BH]    Clinical Course User Index [BH] Janvi Ammar A, PA-C                                 Medical Decision Making Amount and/or Complexity of Data Reviewed External Data Reviewed: labs, radiology, ECG and notes. Labs: ordered. Decision-making details documented in ED Course. Radiology: ordered and independent interpretation performed. Decision-making details documented in ED Course. ECG/medicine tests: ordered and independent interpretation performed. Decision-making details documented in ED Course.  Risk OTC drugs. Prescription drug management. Parenteral controlled substances. Decision regarding hospitalization. Diagnosis or treatment significantly limited by social determinants of health.       Final diagnoses:  Elevated troponin  Multiple subsegmental pulmonary emboli without acute cor pulmonale (HCC)  Rheumatoid arthritis involving both hands, unspecified whether rheumatoid factor present (HCC)  Sarcoid  Immunosuppression due to chronic steroid use  Hypertension, unspecified type    ED Discharge Orders     None          Lane Eland A, PA-C 09/18/24 2339    Jerrol Agent, MD 09/19/24 1654

## 2024-09-18 NOTE — Progress Notes (Signed)
 PHARMACY - ANTICOAGULATION CONSULT NOTE  Pharmacy Consult for heparin  Indication: pulmonary embolus  Allergies[1]  Patient Measurements: Weight: 82 kg (180 lb 12.4 oz)  Vital Signs: Temp: 98.1 F (36.7 C) (12/15 2002) Temp Source: Oral (12/15 2002) BP: 201/73 (12/15 2002) Pulse Rate: 72 (12/15 2002)  Labs: Recent Labs    09/18/24 1802  HGB 12.2  HCT 39.8  PLT 259  CREATININE 1.09*  TROPONINIHS 290*    Estimated Creatinine Clearance: 48.8 mL/min (A) (by C-G formula based on SCr of 1.09 mg/dL (H)).   Medical History: Past Medical History:  Diagnosis Date   Arthritis 2024   Cancer [facility]) 2011   ovarian   Cataract 2024   Complication of anesthesia    GERD (gastroesophageal reflux disease)    Hearing aid worn    both ears   History of chemotherapy 2011   History of hiatal hernia    History of kidney stones    Hypertension    MGUS (monoclonal gammopathy of unknown significance)    dx by hematology per patient   Neuromuscular disorder (HCC)    left foot numbness since  eback surgery   Osteoporosis    PONV (postoperative nausea and vomiting)    Presence of permanent cardiac pacemaker    Sarcoidosis    in remission sees pumonary  once a year dr bellinger lov 08-21-2020 care everywhere   Sick sinus syndrome [facility])    Sleep apnea 2024   Wears glasses     Assessment: 73 YOF presenting with SOB, CT angio chest with PE, she is not on anticoagulation PTA, CBC wnl  Goal of Therapy:  Heparin  level 0.3-0.7 units/ml Monitor platelets by anticoagulation protocol: Yes   Plan:  Heparin  4500 units IV x 1, and gtt at 1250 units/hr F/u 8 hour heparin  level F/u long term AC plan  Dorn Poot, PharmD, [facility] Clinical Pharmacist ED Pharmacist Phone # [phone] 09/18/2024 9:22 PM      [1]  Allergies Allergen Reactions   Augmentin  [Amoxicillin -Pot Clavulanate]     Diarrhea    Flomax [Tamsulosin Hcl] Other (See Comments)    Dizziness   Latex Other (See  Comments)    Skin irritation   Tramadol  Other (See Comments)    jittery, insomnia   Ciprofloxacin Rash

## 2024-09-18 NOTE — ED Triage Notes (Signed)
 Pt complains of SOB x 1 week. Was sent from PCP to have CT scan. SOB worse with exertion.

## 2024-09-18 NOTE — ED Provider Triage Note (Signed)
 Emergency Medicine Provider Triage Evaluation Note  Julia Solis , a 73 y.o. female  was evaluated in triage.  Pt complains of shortness of breath worse with exertion ongoing for 1 week.  At this is following viral URI.  Had normal chest x-ray.  According to office visit patient was sent here for CT scan to rule out pulmonary embolism/pneumonia and also patient was desatting with ambulation.  Review of Systems  Positive: SOb Negative: Cp  Physical Exam  BP (!) 189/95 (BP Location: Left Arm)   Pulse 83   Temp 98.3 F (36.8 C)   Resp (!) 23   Wt 82 kg   SpO2 100%   BMI 30.55 kg/m  Gen:   Awake, no distress   Resp:  Normal effort  MSK:   Moves extremities without difficulty  Other:    Medical Decision Making  Medically screening exam initiated at 5:46 PM.  Appropriate orders placed.  Mickey Hebel was informed that the remainder of the evaluation will be completed by another provider, this initial triage assessment does not replace that evaluation, and the importance of remaining in the ED until their evaluation is complete.     Shermon Warren SAILOR, PA-C 09/18/24 1747

## 2024-09-18 NOTE — ED Triage Notes (Signed)
 Quick triage note : Pt to ED c/o SHOB x 1 week, progressively getting worse, here  from PCP states o2 saturations dropped with ambulation.

## 2024-09-18 NOTE — ED Notes (Signed)
 Patient able to and from bathroom independently. Patient is [facility] but her oxygen  remains stable with no intervention.   Patient was transported to CT by the nurse and informed she would be admitted. Pts daughter is at bedside.   Patients cousin is an ED physician in [place]  and is getting updates from the family.

## 2024-09-18 NOTE — Progress Notes (Addendum)
 Patient Care Team: Perri Ronal PARAS, MD as PCP - General (Internal Medicine) Mealor, Eulas BRAVO, MD as Consulting Physician (Cardiology) Dolphus Reiter, MD as Consulting Physician (Rheumatology)  Visit Date: 09/18/2024  Subjective:    Patient ID: Julia Solis , Female   DOB: 1951-06-26, 73 y.o.    MRN: [id number]   73 y.o. Female presents today for Shortness of breath. Patient has a past medical history of Hypertension, GE reflux, dependent edema. Hx of Sarcoidosis followed by Dr. Alvenia at [facility].  [facility] Friday for shortness of breath. She had been experiencing shortness of breath for approximately 2 weeks, which she attributes to a severe cold that began in 07/2024. The onset of her symptoms was gradual, and she has not previously experienced such breathlessness. At Urgent Care, Pulse  was 102, respiratory rate  was 24  and pulse ox 96% on room air. Pulse ox  was 91%  with walking.Chest x-ray reviewed: No pneumonia, pneumothorax, or pleural effusion. Stable dual chamber pacer, scattered scar in lung apices. No changes noted. Decadron  10 mg IM administered. Duo-Neb nebulization was administered, with noted improvement in breathing and airways. .Twelve-lead EKG completed: Normal sinus rhythm, no arrhythmias or evidence of ischemia.  She was prescribed Breo  Ellipta once daily, albuterol  inhaler every 4-6 hours as needed for shortness of breath, doxycycline  100 mg twice daily for 7 days. She was diagnosed with acute sinusitis and reactive airways.   Today in the office here, she is still short of breath. Walking around the office her oxygen  desaturated to 71%. Concerned is that she may have Pulmonary embolus or Pneumonia in the setting of Sarcoidosis. May also have an element of Congestive heart failure. Urgent CT of lungs, labs and Emergency evaluation is needed.   She has a history of Pulmonary sarcoidosis followed by Dr. Tawni Alvenia at [facility]. Overnight oximetry showed SpO2 dipping below 89% History of mild pulmonary hypertension. Was diagnosed in 2019 after having a renal stone and CT suggested lymphadenopathy. Had PET scan which was abnormal and subsequently diagnosed with sarcoidosis.    Has also been diagnosed with sleep apnea.  History of GE Reflux treated with Famotidine  20 mg daily.     History of Hyperlipidemia treated with Rosuvastatin  5 mg daily. 08/23/2024 Lipid panel CHOL 204, LDL 111, Otherwise WNL.     History of Insomnia treated with Lorazepam  1 mg daily.    History of Dependent edema treated with furosemide  20 mg daily.    History of hypertension treated with losartan  25 mg daily. Blood pressure normal today at 180/90.   Diagnosed with seronegative Rheumatoid arthritis by Dr. Dolphus on 08/04/23. Treated with Methotrexate  0.8 mg injected weekly and Etanercept  1 mL injections weekly.    Seen in [facility] ED on 06/03/23 for worsening dyspnea and swelling of the hands and legs. Chest X-ray showed no acute abnormality. BNP slightly elevated at 264. Discharged with prednisone  5 mg for thirty days, furosemide  20 mg daily for seven days.   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    She has a remote history of ovarian cancer 1C grade 3 clear-cell status post debulking surgery March 2011 by Dr. Jerrol. Had total abdominal hysterectomy, BSO, bilateral pelvic node dissection. Had aortic node sampling, omentectomy, appendectomy and biopsies. Lymph nodes were negative for malignancy. Symptoms started February 2011 with abdominal gas and bloating. CT of the chest at that time  showed tiny nodules thought to be benign but in retrospect could have been sarcoidosis.    Additional past medical history: Ectopic pregnancy 1980, benign left breast biopsy 1993. C-sections in 1992 in 1995. Ganglion cyst removed from wrist in 1975. Tonsillectomy 1957. Had brow lift in 2008.  Past  Medical History:  Diagnosis Date   Arthritis 2024   Cancer [facility]) 2011   ovarian   Cataract 2024   Complication of anesthesia    GERD (gastroesophageal reflux disease)    Hearing aid worn    both ears   History of chemotherapy 2011   History of hiatal hernia    History of kidney stones    Hypertension    MGUS (monoclonal gammopathy of unknown significance)    dx by hematology per patient   Neuromuscular disorder (HCC)    left foot numbness since  eback surgery   Osteoporosis    PONV (postoperative nausea and vomiting)    Presence of permanent cardiac pacemaker    Sarcoidosis    in remission sees pumonary  once a year dr bellinger lov 08-21-2020 care everywhere   Sick sinus syndrome [facility])    Sleep apnea 2024   Wears glasses      Family History  Problem Relation Age of Onset   Hypertension Mother    Hearing loss Mother    Prostate cancer Father 63   Cancer Father    Hypertension Father    Stroke Father    Colon polyps Sister    Colon cancer Maternal Grandmother        dx in her 49s   Diabetes Maternal Grandmother    Heart disease Maternal Grandfather    Hearing loss Maternal Grandfather    Colon cancer Paternal Grandmother    Heart disease Paternal Grandfather    Breast cancer Paternal Aunt 72   Colon cancer Paternal Aunt        dx in her 59s   Melanoma Cousin 65   Breast cancer Cousin 53       multiple cousins   Colon polyps Cousin    Breast cancer Cousin 49   Breast cancer Cousin        father's maternal 1st cousin   Breast cancer Cousin        Father's first cousin's daughter   Cancer Other        maternal great uncle with Cancer - NOS   Cancer Other        maternal great grandmother with either colon or stomach cancer   Breast cancer Other        fathers maternal aunt     Family History Narrative: Father died at age 27 with prostate cancer.  Brother died at age 27 of an AVM rupture.  1 sister in good health.   Social history: Divorced. Has 2 adult  daughters. Does not smoke but did smoke from the ages of 30 through 35. Social alcohol consumption.  She is retired. 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  Resides alone. Her sister is driving her to ED.     Review of Systems  Respiratory:  Positive for shortness of breath.         Objective:   Vitals: BP (!) 180/90   Pulse 86   Temp 97.9 F (36.6 C)   Ht 5' 4.5 (1.638 m)   SpO2 96%   BMI 30.76 kg/m  She looks fatigued.  She is slightly pale.  Respiratory rate is increased  to 20/min with minimal exertion. Skin: Warm and dry.  She has fine rales in right lower lobe.  Cardiac exam regular rate and rhythm.  Resting pulse oximetry is 96% decreases to 71% with minimal exertion which is concerning.  Trace lower extremity edema.  She is alert and oriented x 3.       Results:    Labs:       Component Value Date/Time   NA 142 08/22/2024 0953   NA 144 10/28/2020 1636   K 4.3 08/22/2024 0953   CL 104 08/22/2024 0953   CO2 30 08/22/2024 0953   GLUCOSE 84 08/22/2024 0953   BUN 18 08/22/2024 0953   BUN 16 10/28/2020 1636   CREATININE 0.77 08/22/2024 0953   CALCIUM  8.5 (L) 08/22/2024 0953   PROT 5.6 (L) 08/22/2024 0953   ALBUMIN 3.7 07/18/2024 1504   AST 15 08/22/2024 0953   AST 19 07/18/2024 1504   ALT 8 08/22/2024 0953   ALT 7 07/18/2024 1504   ALKPHOS 77 07/18/2024 1504   BILITOT 0.5 08/22/2024 0953   BILITOT 0.3 07/18/2024 1504   GFRNONAA >60 07/18/2024 1504   GFRNONAA 82 07/11/2020 0949   GFRAA 93 10/28/2020 1636   GFRAA 95 07/11/2020 0949     Lab Results  Component Value Date   WBC 9.0 08/22/2024   HGB 11.3 (L) 08/22/2024   HCT 36.1 08/22/2024   MCV 90.7 08/22/2024   PLT 265 08/22/2024    Lab Results  Component Value Date   CHOL 204 (H) 08/22/2024   HDL 73 08/22/2024   LDLCALC 111 (H) 08/22/2024   TRIG 101 08/22/2024   CHOLHDL 2.8 08/22/2024    Lab Results  Component Value Date   HGBA1C 5.2 08/22/2024     Lab Results   Component Value Date   TSH 3.02 08/22/2024       Assessment & Plan:   Pulmonary Sarcoidosis with acute decompensation of respiratory status-Acute respiratory failure- possible pneumonia vs CHF  Pulse ox drops significantly with walking just a few steps. Not wearing oxygen  at present time.     Significant Shortness of breath: [facility] on Friday for shortness of breath. She has been experiencing shortness of breath for approximately 2 weeks, which she attributes to a severe cold that began in  October 2025. The onset of her symptoms was gradual, and she has not previously experienced such breathlessness. At Urgent Care, Pulse 102, respiration of 24 , pulse ox 96% on room air. Pulse oc was 91% with walking.Chest x-ray showed No pneumonia, pneumothorax, or pleural effusion. Stable dual chamber pacer, scattered scar in lung apices. No changes noted. Treated with Decadron  10 mg IM , Duo-Neb nebulization was administered, with noted improvement in breathing and airways.  Had Resolution of tachypnea. It returns with exertion.Twelve-lead EKG completed: Normal sinus rhythm, no arrhythmias or evidence of ischemia.  She was prescribed Breo Ellipta once daily at Urgent Care, albuterol  inhaler every 4-6 hours as needed for shortness of breath, doxycycline  100 mg twice daily 7 days.At Urgent Care, she was diagnosed with acute sinusitis and reactive airway. Today She is still short of breath. Walking around the office her oxygen  desaturated to 61%. Concerned is that she may have pulmonary embolus or pneumonia in the setting of sarcoidosis, May also have an element of congestive heart failure. Urgent CT of lungs and emergency evaluation needed.   She has a history of Pulmonary sarcoidosis followed by Dr. Tawni  Bellinger at [facility].  History of mild pulmonary hypertension. Was diagnosed in 2019 after having a renal stone and CT suggested  lymphadenopathy. Had PET scan which was abnormal and subsequently diagnosed with sarcoidosis.    GE Reflux: treated with famotidine  20 mg daily.    Hx of sleep apnea  Hx of Rheumatoid arthritis- Currently on Enbrel  per Dr. Dolphus, Rheumatologist  Hx of Cushing's syndrome per Endocrinologist- Dr. Reyes Alexander with [facility]   Hyperlipidemia: treated with Rosuvastatin  5 mg daily. 08/23/2024 Lipid panel CHOL 204, LDL 111, Otherwise WNL.     Insomnia:  treated with Lorazepam  1 mg daily.    Dependent edema:  treated with Furosemide  20 mg daily.    Hypertension: treated with losartan  25 mg daily. Blood pressure elevated today at 180/90.   Diagnosed with Seronegative rheumatoid arthritis by Dr. Dolphus on 08/04/23. Treated with Methotrexate  0.8 mg injected weekly and Etanercept  1 mL injections weekly.    Seen in [facility] ED on 06/03/23 for worsening dyspnea and swelling of the hands and legs. Chest X-ray showed no acute abnormality. BNP slightly elevated at 264. Discharged with prednisone  5 mg for thirty days, furosemide  20 mg daily for seven days.   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    She has a remote history of Ovarian cancer 1C grade 3 clear-cell status post debulking surgery March 2011 by Dr. Jerrol. Had total abdominal hysterectomy, BSO, bilateral pelvic node dissection. Had aortic node sampling, omentectomy, appendectomy and biopsies. Lymph nodes were negative for malignancy. Symptoms started February 2011 with abdominal gas and bloating. CT of the chest at that time showed tiny nodules thought to be benign but in retrospect could have been sarcoidosis.    Additional past medical history: Ectopic pregnancy 1980, benign left breast biopsy 1993. C-sections in 1992 in 1995. Ganglion cyst removed from wrist in 1975. Tonsillectomy 1957. Had brow lift in 2008.    I,Makayla C Reid,acting as a scribe for Ronal JINNY Hailstone, MD.,have documented all relevant documentation on  the behalf of Ronal JINNY Hailstone, MD,as directed by  Ronal JINNY Hailstone, MD while in the presence of Ronal JINNY Hailstone, MD.   I, Ronal JINNY Hailstone, MD, have reviewed all documentation for this visit. The documentation on 09/18/2024 for the exam, diagnosis, procedures, and orders are all accurate and complete.

## 2024-09-19 ENCOUNTER — Inpatient Hospital Stay (HOSPITAL_COMMUNITY)

## 2024-09-19 ENCOUNTER — Ambulatory Visit (HOSPITAL_COMMUNITY): Payer: Self-pay

## 2024-09-19 DIAGNOSIS — R7989 Other specified abnormal findings of blood chemistry: Secondary | ICD-10-CM

## 2024-09-19 DIAGNOSIS — E2740 Unspecified adrenocortical insufficiency: Secondary | ICD-10-CM | POA: Diagnosis present

## 2024-09-19 DIAGNOSIS — I2699 Other pulmonary embolism without acute cor pulmonale: Secondary | ICD-10-CM

## 2024-09-19 DIAGNOSIS — I2609 Other pulmonary embolism with acute cor pulmonale: Secondary | ICD-10-CM

## 2024-09-19 LAB — ECHOCARDIOGRAM COMPLETE
Area-P 1/2: 2.29 cm2
Height: 64.5 in
S' Lateral: 2.8 cm
Weight: 2892.44 [oz_av]

## 2024-09-19 LAB — CBC
HCT: 38.3 % (ref 36.0–46.0)
Hemoglobin: 11.8 g/dL — ABNORMAL LOW (ref 12.0–15.0)
MCH: 28.6 pg (ref 26.0–34.0)
MCHC: 30.8 g/dL (ref 30.0–36.0)
MCV: 92.7 fL (ref 80.0–100.0)
Platelets: 242 K/uL (ref 150–400)
RBC: 4.13 MIL/uL (ref 3.87–5.11)
RDW: 18.7 % — ABNORMAL HIGH (ref 11.5–15.5)
WBC: 12.6 K/uL — ABNORMAL HIGH (ref 4.0–10.5)
nRBC: 0 % (ref 0.0–0.2)

## 2024-09-19 LAB — HEPARIN LEVEL (UNFRACTIONATED)
Heparin Unfractionated: 0.3 [IU]/mL (ref 0.30–0.70)
Heparin Unfractionated: 0.3 [IU]/mL (ref 0.30–0.70)

## 2024-09-19 LAB — BASIC METABOLIC PANEL WITH GFR
Anion gap: 9 (ref 5–15)
BUN: 25 mg/dL — ABNORMAL HIGH (ref 8–23)
CO2: 27 mmol/L (ref 22–32)
Calcium: 8 mg/dL — ABNORMAL LOW (ref 8.9–10.3)
Chloride: 107 mmol/L (ref 98–111)
Creatinine, Ser: 0.91 mg/dL (ref 0.44–1.00)
GFR, Estimated: >60 mL/min (ref >60)
Glucose, Bld: 106 mg/dL — ABNORMAL HIGH (ref 70–99)
Potassium: 3.3 mmol/L — ABNORMAL LOW (ref 3.5–5.1)
Sodium: 143 mmol/L (ref 135–145)

## 2024-09-19 LAB — TROPONIN I (HIGH SENSITIVITY): Troponin I (High Sensitivity): 363 ng/L (ref <18)

## 2024-09-19 MED ORDER — FOLIC ACID 1 MG PO TABS
2.0000 mg | ORAL_TABLET | Freq: Every day | ORAL | Status: DC
  Administered 2024-09-19: 22:00:00 2 mg via ORAL

## 2024-09-19 MED ORDER — OXYCODONE HCL 5 MG PO TABS: 5.0000 mg | ORAL_TABLET | ORAL | Status: DC | PRN

## 2024-09-19 MED ORDER — LORAZEPAM 1 MG PO TABS
1.0000 mg | ORAL_TABLET | Freq: Every day | ORAL | Status: DC
  Administered 2024-09-19: 23:00:00 1 mg via ORAL

## 2024-09-19 MED ORDER — ROSUVASTATIN CALCIUM 5 MG PO TABS: 5.0000 mg | ORAL_TABLET | ORAL | Status: DC

## 2024-09-19 MED ORDER — SODIUM CHLORIDE 0.9% FLUSH
3.0000 mL | Freq: Two times a day (BID) | INTRAVENOUS | Status: DC
  Administered 2024-09-20: 12:00:00 3 mL via INTRAVENOUS

## 2024-09-19 MED ORDER — LOSARTAN POTASSIUM 50 MG PO TABS: 100.0000 mg | ORAL_TABLET | Freq: Every day | ORAL | Status: DC

## 2024-09-19 MED ORDER — PREDNISONE 5 MG PO TABS
7.5000 mg | ORAL_TABLET | Freq: Every day | ORAL | Status: DC
  Administered 2024-09-19 – 2024-09-20 (×2): 7.5 mg via ORAL
  Filled 2024-09-19 (×2): qty 2

## 2024-09-19 MED ORDER — FOLIC ACID 1 MG PO TABS: 2.0000 mg | ORAL_TABLET | Freq: Every day | ORAL | Status: DC

## 2024-09-19 MED ORDER — ACETAMINOPHEN 650 MG RE SUPP: 650.0000 mg | Freq: Four times a day (QID) | RECTAL | Status: DC | PRN

## 2024-09-19 MED ORDER — HYDROCORTISONE 10 MG PO TABS: 10.0000 mg | ORAL_TABLET | ORAL | Status: DC

## 2024-09-19 MED ORDER — ACETAMINOPHEN 325 MG PO TABS: 650.0000 mg | ORAL_TABLET | Freq: Four times a day (QID) | ORAL | Status: DC | PRN

## 2024-09-19 MED ORDER — FAMOTIDINE 20 MG PO TABS
20.0000 mg | ORAL_TABLET | Freq: Every day | ORAL | Status: DC
  Administered 2024-09-19: 23:00:00 20 mg via ORAL

## 2024-09-19 MED ORDER — ONDANSETRON HCL 4 MG PO TABS: 4.0000 mg | ORAL_TABLET | Freq: Four times a day (QID) | ORAL | Status: DC | PRN

## 2024-09-19 MED ORDER — SENNOSIDES-DOCUSATE SODIUM 8.6-50 MG PO TABS: 1.0000 | ORAL_TABLET | Freq: Every evening | ORAL | Status: DC | PRN

## 2024-09-19 MED ORDER — HYDROCORTISONE 20 MG PO TABS
30.0000 mg | ORAL_TABLET | Freq: Every day | ORAL | Status: DC
  Filled 2024-09-19 (×2): qty 1

## 2024-09-19 MED ORDER — PREDNISONE 5 MG PO TABS: 5.0000 mg | ORAL_TABLET | Freq: Every day | ORAL | Status: DC

## 2024-09-19 MED ORDER — ONDANSETRON HCL 4 MG/2ML IJ SOLN: 4.0000 mg | Freq: Four times a day (QID) | INTRAMUSCULAR | Status: DC | PRN

## 2024-09-19 MED ORDER — HYDROCORTISONE 10 MG PO TABS
10.0000 mg | ORAL_TABLET | Freq: Every day | ORAL | Status: DC
  Filled 2024-09-19 (×3): qty 1

## 2024-09-19 NOTE — H&P (Signed)
 History and Physical    Julia Solis FMW:[id number] DOB: 12-01-1950 DOA: 09/18/2024  PCP: Perri Ronal PARAS, MD   Patient coming from: Home   Chief Complaint: SOB   HPI: Julia Solis is a 73 y.o. female with medical history significant for hypertension, hyperlipidemia, OSA, MGUS, sick sinus syndrome with pacemaker, insomnia, sarcoidosis in remission, and rheumatoid arthritis who presents with shortness of breath.  Patient reports that she has been short of breath for more than 1 week now.  She denies any cough, chest pain, or leg swelling associated with this.  She has not had any leg pain and denies personal or family history of VTE.  She denies any recent surgery.  She rode in a car on a 500 mile trip in September but has not had any long distance travel since then.  She denies any history of significant bleeding.  She does not fall frequently.  ED Course: Upon arrival to the ED, patient is found to be afebrile and saturating well on room air with normal RR, normal HR, and elevated BP.  Labs are most notable for creatinine 1.09, WBC 12,900, normal hemoglobin and platelets, troponin 290, and BNP 687.  CTA chest is concerning for bilateral segmental and subsegmental PE and new right lower lobe lung nodules.  Patient was given a dose of IV Lasix  in the ED and was started on IV heparin .  Pulmonology/critical care was consulted with ED PA and recommended IV heparin  and hospitalist admission.   Review of Systems:  All other systems reviewed and apart from HPI, are negative.  Past Medical History:  Diagnosis Date   Arthritis 2024   Cancer [facility]) 2011   ovarian   Cataract 2024   Complication of anesthesia    GERD (gastroesophageal reflux disease)    Hearing aid worn    both ears   History of chemotherapy 2011   History of hiatal hernia    History of kidney stones    Hypertension    MGUS (monoclonal gammopathy of unknown significance)    dx by hematology per patient    Neuromuscular disorder (HCC)    left foot numbness since  eback surgery   Osteoporosis    PONV (postoperative nausea and vomiting)    Presence of permanent cardiac pacemaker    Sarcoidosis    in remission sees pumonary  once a year dr bellinger lov 08-21-2020 care everywhere   Sick sinus syndrome [facility])    Sleep apnea 2024   Wears glasses     Past Surgical History:  Procedure Laterality Date   ABDOMINAL HYSTERECTOMY  2011   APPENDECTOMY  2011    ?with hysterectomy   BACK SURGERY  1997   lumbar   CARPAL TUNNEL RELEASE Right    CATARACT EXTRACTION Right 08/10/2023   CATARACT EXTRACTION Left 08/24/2023   CESAREAN SECTION  1982, 1985   COSMETIC SURGERY  2007   brow lift   CYSTOSCOPY WITH RETROGRADE PYELOGRAM, URETEROSCOPY AND STENT PLACEMENT Left 02/04/2021   Procedure: CYSTOSCOPY WITH RETROGRADE PYELOGRAM, URETEROSCOPY AND STENT PLACEMENT;  Surgeon: Rosalind Zachary NOVAK, MD;  Location: [facility];  Service: Urology;  Laterality: Left;  1 HR; nephrolithiasis   ECTOPIC PREGNANCY SURGERY  1980   EXTRACORPOREAL SHOCK WAVE LITHOTRIPSY Left 06/23/2018   Procedure: LEFT EXTRACORPOREAL SHOCK WAVE LITHOTRIPSY (ESWL);  Surgeon: Sherrilee Belvie CROME, MD;  Location: [facility];  Service: Urology;  Laterality: Left;   GANGLION CYST EXCISION  1975   HOLMIUM LASER APPLICATION Left 02/04/2021  Procedure: HOLMIUM LASER APPLICATION;  Surgeon: Rosalind Zachary NOVAK, MD;  Location: [facility];  Service: Urology;  Laterality: Left;   PACEMAKER IMPLANT N/A 11/13/2020   Procedure: PACEMAKER IMPLANT;  Surgeon: Kelsie Agent, MD;  Location: [facility];  Service: Cardiovascular;  Laterality: N/A;   SPINE SURGERY  1997   TONSILLECTOMY  1957   adenoids removed    Social History:   reports that she quit smoking about 47 years ago. Her smoking use included cigarettes. She started smoking about 58 years ago. She has a 16.5 pack-year smoking history. She has been exposed to tobacco  smoke. She has never used smokeless tobacco. She reports that she does not currently use alcohol. She reports that she does not use drugs.  Allergies[1]  Family History  Problem Relation Age of Onset   Hypertension Mother    Hearing loss Mother    Prostate cancer Father 47   Cancer Father    Hypertension Father    Stroke Father    Colon polyps Sister    Colon cancer Maternal Grandmother        dx in her 88s   Diabetes Maternal Grandmother    Heart disease Maternal Grandfather    Hearing loss Maternal Grandfather    Colon cancer Paternal Grandmother    Heart disease Paternal Grandfather    Breast cancer Paternal Aunt 22   Colon cancer Paternal Aunt        dx in her 44s   Melanoma Cousin 57   Breast cancer Cousin 33       multiple cousins   Colon polyps Cousin    Breast cancer Cousin 75   Breast cancer Cousin        father's maternal 1st cousin   Breast cancer Cousin        Father's first cousin's daughter   Cancer Other        maternal great uncle with Cancer - NOS   Cancer Other        maternal great grandmother with either colon or stomach cancer   Breast cancer Other        fathers maternal aunt     Prior to Admission medications  Medication Sig Start Date End Date Taking? Authorizing Provider  albuterol  (VENTOLIN  HFA) 108 (90 Base) MCG/ACT inhaler Inhale 2 puffs into the lungs every 4 (four) hours as needed for shortness of breath. 09/15/24 10/15/24  [provider]  alendronate  (FOSAMAX ) 70 MG tablet Take 1 tablet (70 mg total) by mouth once a week. Take with a full glass of water on an empty stomach. 07/03/24   Deveshwar, Maya, MD  BREO ELLIPTA 50-25 MCG/INH AEPB Inhale 1 puff into the lungs daily. 09/15/24 10/15/24  [provider]  Cholecalciferol (VITAMIN D ) 50 MCG (2000 UT) tablet Take 2,000 Units by mouth daily.    [provider]  doxycycline  (VIBRA -TABS) 100 MG tablet Take 100 mg by mouth daily. 09/15/24 09/22/24  [provider]  etanercept  (ENBREL  MINI) 50 MG/ML injection Inject 1 mL (50 mg total) into the skin once a week. 08/09/24   Cheryl Waddell HERO, PA-C  famotidine  (PEPCID ) 20 MG tablet Take 20 mg by mouth at bedtime.    [provider]  fluticasone (FLONASE) 50 MCG/ACT nasal spray Place into the nose 3 (three) times a week.    [provider]  folic acid  (FOLVITE ) 1 MG tablet Take 2 tablets (2 mg total) by mouth daily. 03/21/24   Cheryl,  Waddell HERO, PA-C  furosemide  (LASIX ) 20 MG tablet Take 1 tablet (20 mg total) by mouth daily. 08/24/24   Perri Ronal PARAS, MD  hydrocortisone  (CORTEF ) 10 MG tablet 3 tablets between 6AM to 8AM, 1 tablet around 2PM Orally Twice a day 06/28/24   [provider]  loratadine (CLARITIN) 10 MG tablet Take 10 mg by mouth daily.    [provider]  LORazepam  (ATIVAN ) 1 MG tablet TAKE 1 TABLET BY MOUTH EVERY DAY AT BEDTIME AS NEEDED 04/18/24   Perri Ronal PARAS, MD  losartan  (COZAAR ) 100 MG tablet TAKE 1 TABLET BY MOUTH EVERY DAY 09/14/24   Perri Ronal PARAS, MD  methotrexate  50 MG/2ML injection Inject 0.8 mLs (20 mg total) into the skin once a week. 07/03/24   Dolphus Reiter, MD  Multiple Vitamin (MULTIVITAMIN WITH MINERALS) TABS tablet Take 1 tablet by mouth daily.    [provider]  oxyCODONE  (OXY IR/ROXICODONE ) 5 MG immediate release tablet one-quarter to one-half tablet as needed Orally up to once a day    [provider]  Polyethyl Glycol-Propyl Glycol (SYSTANE) 0.4-0.3 % GEL ophthalmic gel Place 1 application  into both eyes in the morning and at bedtime.    [provider]  predniSONE  (DELTASONE ) 10 MG tablet Take 10 mg by mouth daily with breakfast.    [provider]  rosuvastatin  (CRESTOR ) 5 MG tablet TAKE 1 TABLET BY MOUTH 3 TIMES A WEEK WITH SUPPER 04/18/24   Baxley, Ronal PARAS, MD  TUBERCULIN SYR 1CC/27GX1/2 (B-D TB SYRINGE 1CC/27GX1/2) 27G X 1/2 1 ML MISC Inject 1 Syringe into the skin once a week. 04/26/24    Dolphus Reiter, MD    Physical Exam: Vitals:   09/18/24 2300 09/19/24 0015 09/19/24 0030 09/19/24 0117  BP: (!) 208/108 (!) 162/71 (!) 167/69   Pulse: 79 77 75   Resp: 18 (!) 21 (!) 23   Temp:      TempSrc:      SpO2: 99% 98% 98% 98%  Weight:        Constitutional: NAD, no diaphoresis   Eyes: PERTLA, lids and conjunctivae normal ENMT: Mucous membranes are moist. Posterior pharynx clear of any exudate or lesions.   Neck: supple, no masses  Respiratory: no wheezing, no crackles. No accessory muscle use.  Cardiovascular: S1 & S2 heard, regular rate and rhythm. No extremity edema.  Abdomen: No tenderness, soft. Bowel sounds active.  Musculoskeletal: no clubbing / cyanosis. No joint deformity upper and lower extremities.   Skin: no significant rashes, lesions, ulcers. Warm, dry, well-perfused. Neurologic: CN 2-12 grossly intact. Moving all extremities. Alert and oriented.  Psychiatric: Calm. Cooperative.    Labs and Imaging on Admission: I have personally reviewed following labs and imaging studies  CBC: Recent Labs  Lab 09/18/24 1802  WBC 12.9*  HGB 12.2  HCT 39.8  MCV 92.6  PLT 259   Basic Metabolic Panel: Recent Labs  Lab 09/18/24 1802  NA 144  K 4.4  CL 105  CO2 27  GLUCOSE 117*  BUN 25*  CREATININE 1.09*  CALCIUM  8.6*   GFR: Estimated Creatinine Clearance: 48.8 mL/min (A) (by C-G formula based on SCr of 1.09 mg/dL (H)). Liver Function Tests: Recent Labs  Lab 09/18/24 1802  AST 35  ALT 18  ALKPHOS 58  BILITOT 1.3*  PROT 5.9*  ALBUMIN 3.1*   No results for input(s): LIPASE, AMYLASE in the last 168 hours. No results for input(s): AMMONIA in the last 168 hours. Coagulation Profile: No  results for input(s): INR, PROTIME in the last 168 hours. Cardiac Enzymes: No results for input(s): CKTOTAL, CKMB, CKMBINDEX, TROPONINI in the last 168 hours. BNP (last 3 results) No results for input(s): PROBNP in the last 8760  hours. HbA1C: No results for input(s): HGBA1C in the last 72 hours. CBG: No results for input(s): GLUCAP in the last 168 hours. Lipid Profile: No results for input(s): CHOL, HDL, LDLCALC, TRIG, CHOLHDL, LDLDIRECT in the last 72 hours. Thyroid  Function Tests: No results for input(s): TSH, T4TOTAL, FREET4, T3FREE, THYROIDAB in the last 72 hours. Anemia Panel: No results for input(s): VITAMINB12, FOLATE, FERRITIN, TIBC, IRON, RETICCTPCT in the last 72 hours. Urine analysis:    Component Value Date/Time   BILIRUBINUR neg 08/19/2022 1137   PROTEINUR Negative 08/19/2022 1137   UROBILINOGEN 0.2 08/19/2022 1137   NITRITE neg 08/19/2022 1137   LEUKOCYTESUR Negative 08/19/2022 1137   Sepsis Labs: @LABRCNTIP (procalcitonin:4,lacticidven:4) ) Recent Results (from the past 240 hours)  Resp panel by RT-PCR (RSV, Flu A&B, Covid) Anterior Nasal Swab     Status: None   Collection Time: 09/18/24  6:02 PM   Specimen: Anterior Nasal Swab  Result Value Ref Range Status   SARS Coronavirus 2 by RT PCR NEGATIVE NEGATIVE Final   Influenza A by PCR NEGATIVE NEGATIVE Final   Influenza B by PCR NEGATIVE NEGATIVE Final    Comment: (NOTE) The Xpert Xpress SARS-CoV-2/FLU/RSV plus assay is intended as an aid in the diagnosis of influenza from Nasopharyngeal swab specimens and should not be used as a sole basis for treatment. Nasal washings and aspirates are unacceptable for Xpert Xpress SARS-CoV-2/FLU/RSV testing.  Fact Sheet for Patients: bloggercourse.com  Fact Sheet for Healthcare Providers: seriousbroker.it  This test is not yet approved or cleared by the [place]  FDA and has been authorized for detection and/or diagnosis of SARS-CoV-2 by FDA under an Emergency Use Authorization (EUA). This EUA will remain in effect (meaning this test can be used) for the duration of the COVID-19 declaration under Section  564(b)(1) of the Act, 21 U.S.C. section 360bbb-3(b)(1), unless the authorization is terminated or revoked.     Resp Syncytial Virus by PCR NEGATIVE NEGATIVE Final    Comment: (NOTE) Fact Sheet for Patients: bloggercourse.com  Fact Sheet for Healthcare Providers: seriousbroker.it  This test is not yet approved or cleared by the [place]  FDA and has been authorized for detection and/or diagnosis of SARS-CoV-2 by FDA under an Emergency Use Authorization (EUA). This EUA will remain in effect (meaning this test can be used) for the duration of the COVID-19 declaration under Section 564(b)(1) of the Act, 21 U.S.C. section 360bbb-3(b)(1), unless the authorization is terminated or revoked.  Performed at [facility], [address] [place], [place] [address]      Radiological Exams on Admission: CT Angio Chest PE W and/or Wo Contrast Result Date: 09/18/2024 EXAM: CTA of the Chest with contrast for PE 09/18/2024 08:46:26 PM TECHNIQUE: CTA of the chest was performed without and with the administration of 75 mL of intravenous iohexol  (OMNIPAQUE ) 350 MG/ML injection. Multiplanar reformatted images are provided for review. MIP images are provided for review. Automated exposure control, iterative reconstruction, and/or weight based adjustment of the mA/kV was utilized to reduce the radiation dose to as low as reasonably achievable. COMPARISON: CT chest 06/02/2011. CLINICAL HISTORY: Pulmonary embolism (PE) suspected, high prob. FINDINGS: PULMONARY ARTERIES: Pulmonary arteries are adequately opacified for evaluation. Pulmonary emboli are seen within segmental branches of the bilateral upper lobes as well as  within segmental and subsegmental branches of the bilateral lower lobes and right middle lobe. Main pulmonary artery is enlarged. MEDIASTINUM: The heart is moderately enlarged, a new finding. Left sided pacemaker is present. There is no  acute abnormality of the thoracic aorta. The thyroid  gland is heterogeneous diffusely. Isthmus nodule measures 17 mm, new from prior. There is a 3.3 x 1.7 cm low density nodule in the right paratracheal region which has increased in size. LYMPH NODES: There are no new enlarged hilar or axillary lymph nodes identified. LUNGS AND PLEURA: Chronic appearing pleural parenchymal opacities with bronchiectasis in the bilateral upper lobes superior unchanged from 2012. There is a new subsolid nodular density in the right lower lobe measuring 11 mm (image 6/99). Ground glass slightly nodular area measures 12 mm in the right lower lobe (image 6/104) which is new from prior. There is no pleural effusion or pneumothorax. UPPER ABDOMEN: There is a small hiatal hernia. There is a cyst in the left lobe of the liver measuring 17 mm. SOFT TISSUES AND BONES: No acute bone or soft tissue abnormality. IMPRESSION: 1. Pulmonary emboli within segmental branches of the bilateral upper lobes as well as within segmental and subsegmental branches of the bilateral lower lobes and right middle lobe. 2. New subsolid pulmonary nodules in the right lower lobe measuring 11 mm and 12 mm; recommend CT at 3-6 months to confirm persistence, and if unchanged with solid component 5 mm, then annual CT for 5 years, as per Fleischner Society Guidelines. 3. Moderate cardiomegaly. 4. Enlarged main pulmonary artery. 5. Enlarging right paratracheal lymph node. 6. Stable chronic parenchymal opacities in the bilateral upper lobes. Electronically signed by: Greig Pique MD 09/18/2024 09:12 PM EST RP Workstation: [id number]    EKG: Independently reviewed. Sinus rhythm, RAD.   Assessment/Plan   1. Acute pulmonary embolism  - She is hemodynamically stable and on room air in ED with elevated troponin and BNP  - Continue IV heparin , check echocardiogram and LE venous Dopplers   2. Elevated troponin  - No chest pain  - Likely d/t PE, will follow-up on echo  findings    3. RA  - Followed by Dr. Dolphus and managed with Enbrel , methotrexate , and prednisone    4. Sarcoidosis  - In remission, followed by Dr. Alvenia at [facility]    5. Adrenal insufficiency  - Continue hydrocortisone     6. Insomnia  - Continue Ativan  at bedtime   7. Hypertension  - Losartan    8. Hyperlipidemia  - Crestor    9. Lung nodule  - New RUL nodule noted on CT in ED  - Outpatient follow-up recommended    DVT prophylaxis: IV heparin   Code Status: Full  Level of Care: Level of care: Progressive Family Communication: Daughter updated in ED   Disposition Plan:  Patient is from: Home  Anticipated d/c is to: Home  Anticipated d/c date is: 09/20/24 Patient currently: Pending echocardiogram, tolerance of anticoagulation, clinical stability, and transition to oral medications  Consults called: none  Admission status: Inpatient     Evalene GORMAN Sprinkles, MD [facility]  09/19/2024, 1:19 AM       [1]  Allergies Allergen Reactions   Augmentin  [Amoxicillin -Pot Clavulanate]     Diarrhea    Flomax [Tamsulosin Hcl] Other (See Comments)    Dizziness   Latex Other (See Comments)    Skin irritation   Tramadol  Other (See Comments)    jittery, insomnia   Ciprofloxacin Rash

## 2024-09-19 NOTE — Progress Notes (Signed)
 PHARMACY - ANTICOAGULATION CONSULT NOTE  Pharmacy Consult for heparin  Indication: pulmonary embolus  Allergies[1]  Patient Measurements: Weight: 82 kg (180 lb 12.4 oz)  Vital Signs: Temp: 98.1 F (36.7 C) (12/16 1421) Temp Source: Oral (12/16 1421) BP: 153/91 (12/16 1430) Pulse Rate: 84 (12/16 1430)  Labs: Recent Labs    09/18/24 1802 09/18/24 2020 09/19/24 0405 09/19/24 0630 09/19/24 1719  HGB 12.2  --  11.8*  --   --   HCT 39.8  --  38.3  --   --   PLT 259  --  242  --   --   HEPARINUNFRC  --   --   --  0.30 0.30  CREATININE 1.09*  --  0.91  --   --   TROPONINIHS 290* 311* 363*  --   --     Estimated Creatinine Clearance: 58.5 mL/min (by C-G formula based on SCr of 0.91 mg/dL).   Medical History: Past Medical History:  Diagnosis Date   Arthritis 2024   Cancer [facility]) 2011   ovarian   Cataract 2024   Complication of anesthesia    GERD (gastroesophageal reflux disease)    Hearing aid worn    both ears   History of chemotherapy 2011   History of hiatal hernia    History of kidney stones    Hypertension    MGUS (monoclonal gammopathy of unknown significance)    dx by hematology per patient   Neuromuscular disorder (HCC)    left foot numbness since  eback surgery   Osteoporosis    PONV (postoperative nausea and vomiting)    Presence of permanent cardiac pacemaker    Sarcoidosis    in remission sees pumonary  once a year dr bellinger lov 08-21-2020 care everywhere   Sick sinus syndrome [facility])    Sleep apnea 2024   Wears glasses     Assessment: 73 YOF presenting with SOB, CT angio chest with PE, she is not on anticoagulation PTA, CBC wnl  Heparin  level remains low end therapeutic s/p rate change to 1350 units/hr  Goal of Therapy:  Heparin  level 0.3-0.7 units/ml Monitor platelets by anticoagulation protocol: Yes   Plan:  Increase heparin  gtt to 1450 units/hr F/u 8 hour heparin  level F/u plan to transition to DOAC in AM  Dorn Poot, PharmD,  [facility] Clinical Pharmacist ED Pharmacist Phone # [phone] 09/19/2024 6:51 PM        [1]  Allergies Allergen Reactions   Augmentin  [Amoxicillin -Pot Clavulanate] Nausea And Vomiting and Other (See Comments)    Diarrhea    Latex Other (See Comments)    Skin irritation   Tramadol  Other (See Comments)    jittery, insomnia   Ciprofloxacin Rash   Flomax [Tamsulosin Hcl] Other (See Comments)    Dizziness

## 2024-09-19 NOTE — ED Notes (Signed)
 MD at [facility]

## 2024-09-19 NOTE — Progress Notes (Signed)
 Progress Note    Julia Solis  FMW:[id number] DOB: June 06, 1951  DOA: 09/18/2024 PCP: Perri Ronal PARAS, MD      Brief Narrative:    Medical records reviewed and are as summarized below:  Julia Solis is a 73 y.o. female with medical history significant for hypertension, hyperlipidemia, OSA, MGUS, sick sinus syndrome with pacemaker, insomnia, sarcoidosis in remission, and rheumatoid arthritis, who presented to the hospital with shortness of breath.  Shortness of breath had been going on for about a week.  She had a long road trip in September 2025 when she traveled for about 500 miles.  No long road trips since then.  No chest pain, lower extremity pain, dizziness, palpitations, near-syncope or syncope.  Vital signs in the ED: Temperature 97.9, pulse 86, respiratory rate 23, BP 180/90, O2 sat 96% on room air.  Elevated troponins: 290, 311, 363. Elevated BNP 687.1   CTA chest: IMPRESSION: 1. Pulmonary emboli within segmental branches of the bilateral upper lobes as well as within segmental and subsegmental branches of the bilateral lower lobes and right middle lobe. 2. New subsolid pulmonary nodules in the right lower lobe measuring 11 mm and 12 mm; recommend CT at 3-6 months to confirm persistence, and if unchanged with solid component 5 mm, then annual CT for 5 years, as per Fleischner Society Guidelines. 3. Moderate cardiomegaly. 4. Enlarged main pulmonary artery. 5. Enlarging right paratracheal lymph node. 6. Stable chronic parenchymal opacities in the bilateral upper lobes.      Assessment/Plan:   Principal Problem:   Acute pulmonary embolism (HCC) Active Problems:   Hypertension   Insomnia   Seronegative rheumatoid arthritis (HCC)   Sarcoidosis   Sleep apnea   Lung nodule   Elevated troponin   Adrenal insufficiency    Body mass index is 30.55 kg/m.   Acute pulmonary embolism, right lower extremity DVT: She still feels short of breath with  minimal exertion.  Continue IV heparin  drip and monitor heparin  level per protocol.  Discussed management of pulmonary embolism for long-term anticoagulation.  Discussed risks and benefits of long-term anticoagulation.  Patient is agreeable to treatment with Eliquis .  Plan to switch IV heparin  to Eliquis  tomorrow. 2D echo is pending  Venous duplex showed DVT in right popliteal vein and acute intravascular thrombosis involving right gastrocnemius veins.   Elevated troponin: This is likely due to demand ischemia.   Adrenal insufficiency: Continue hydrocortisone  30 mg in the morning and 10 mg in the afternoon.   Rheumatoid arthritis: She takes prednisone  5 to 10 mg daily.  She normally takes 7.5 mg daily.  She is currently using 7.5 mg dose. She also takes methotrexate  and Enbrel .   New right upper lung nodule noted on CT chest: Outpatient follow-up recommended.   Comorbidities include insomnia, hypertension, hyperlipidemia, sarcoidosis in remission,    Diet Order             Diet regular Room service appropriate? Yes; Fluid consistency: Thin  Diet effective now                                  Consultants: None  Procedures: None    Medications:    famotidine   20 mg Oral QHS   folic acid   2 mg Oral Daily   hydrocortisone   10 mg Oral Q1400   hydrocortisone   30 mg Oral Q breakfast   LORazepam   1 mg Oral  QHS   losartan   100 mg Oral Daily   predniSONE   7.5 mg Oral Q breakfast   [START ON 09/20/2024] rosuvastatin   5 mg Oral Q M,W,F-1800   sodium chloride  flush  3 mL Intravenous Q12H   Continuous Infusions:  heparin  1,350 Units/hr (09/19/24 0711)     Anti-infectives (From admission, onward)    None              Family Communication/Anticipated D/C date and plan/Code Status   DVT prophylaxis:      Code Status: Full Code  Family Communication: None Disposition Plan: Plan to discharge home   Status is: Inpatient Remains  inpatient appropriate because: Acute pulmonary embolism shortness of breath       Subjective:   Interval events noted.  She complains of shortness of breath even with minimal exertion.  No chest pain or lower extremity pain.  Objective:    Vitals:   09/19/24 0600 09/19/24 0830 09/19/24 0920 09/19/24 1116  BP: (!) 143/66   (!) 157/82  Pulse: 70  81 79  Resp: 19  15 19   Temp:  98.1 F (36.7 C)    TempSrc:  Oral    SpO2: 98%  100% 97%  Weight:       No data found.  No intake or output data in the 24 hours ending 09/19/24 1201 Filed Weights   09/18/24 1731  Weight: 82 kg    Exam:  GEN: NAD SKIN: Warm and dry EYES: No pallor or icterus ENT: MMM CV: RRR PULM: CTA B ABD: soft, ND, NT, +BS CNS: AAO x 3, non focal EXT: No edema or tenderness        Data Reviewed:   I have personally reviewed following labs and imaging studies:  Labs: Labs show the following:   Basic Metabolic Panel: Recent Labs  Lab 09/18/24 1802 09/19/24 0405  NA 144 143  K 4.4 3.3*  CL 105 107  CO2 27 27  GLUCOSE 117* 106*  BUN 25* 25*  CREATININE 1.09* 0.91  CALCIUM  8.6* 8.0*   GFR Estimated Creatinine Clearance: 58.5 mL/min (by C-G formula based on SCr of 0.91 mg/dL). Liver Function Tests: Recent Labs  Lab 09/18/24 1802  AST 35  ALT 18  ALKPHOS 58  BILITOT 1.3*  PROT 5.9*  ALBUMIN 3.1*   No results for input(s): LIPASE, AMYLASE in the last 168 hours. No results for input(s): AMMONIA in the last 168 hours. Coagulation profile No results for input(s): INR, PROTIME in the last 168 hours.  CBC: Recent Labs  Lab 09/18/24 1802 09/19/24 0405  WBC 12.9* 12.6*  HGB 12.2 11.8*  HCT 39.8 38.3  MCV 92.6 92.7  PLT 259 242   Cardiac Enzymes: No results for input(s): CKTOTAL, CKMB, CKMBINDEX, TROPONINI in the last 168 hours. BNP (last 3 results) No results for input(s): PROBNP in the last 8760 hours. CBG: No results for input(s): GLUCAP in the  last 168 hours. D-Dimer: No results for input(s): DDIMER in the last 72 hours. Hgb A1c: No results for input(s): HGBA1C in the last 72 hours. Lipid Profile: No results for input(s): CHOL, HDL, LDLCALC, TRIG, CHOLHDL, LDLDIRECT in the last 72 hours. Thyroid  function studies: No results for input(s): TSH, T4TOTAL, T3FREE, THYROIDAB in the last 72 hours.  Invalid input(s): FREET3 Anemia work up: No results for input(s): VITAMINB12, FOLATE, FERRITIN, TIBC, IRON, RETICCTPCT in the last 72 hours. Sepsis Labs: Recent Labs  Lab 09/18/24 1802 09/19/24 0405  WBC 12.9* 12.6*  Microbiology Recent Results (from the past 240 hours)  Resp panel by RT-PCR (RSV, Flu A&B, Covid) Anterior Nasal Swab     Status: None   Collection Time: 09/18/24  6:02 PM   Specimen: Anterior Nasal Swab  Result Value Ref Range Status   SARS Coronavirus 2 by RT PCR NEGATIVE NEGATIVE Final   Influenza A by PCR NEGATIVE NEGATIVE Final   Influenza B by PCR NEGATIVE NEGATIVE Final    Comment: (NOTE) The Xpert Xpress SARS-CoV-2/FLU/RSV plus assay is intended as an aid in the diagnosis of influenza from Nasopharyngeal swab specimens and should not be used as a sole basis for treatment. Nasal washings and aspirates are unacceptable for Xpert Xpress SARS-CoV-2/FLU/RSV testing.  Fact Sheet for Patients: bloggercourse.com  Fact Sheet for Healthcare Providers: seriousbroker.it  This test is not yet approved or cleared by the [place]  FDA and has been authorized for detection and/or diagnosis of SARS-CoV-2 by FDA under an Emergency Use Authorization (EUA). This EUA will remain in effect (meaning this test can be used) for the duration of the COVID-19 declaration under Section 564(b)(1) of the Act, 21 U.S.C. section 360bbb-3(b)(1), unless the authorization is terminated or revoked.     Resp Syncytial Virus by PCR NEGATIVE  NEGATIVE Final    Comment: (NOTE) Fact Sheet for Patients: bloggercourse.com  Fact Sheet for Healthcare Providers: seriousbroker.it  This test is not yet approved or cleared by the [place]  FDA and has been authorized for detection and/or diagnosis of SARS-CoV-2 by FDA under an Emergency Use Authorization (EUA). This EUA will remain in effect (meaning this test can be used) for the duration of the COVID-19 declaration under Section 564(b)(1) of the Act, 21 U.S.C. section 360bbb-3(b)(1), unless the authorization is terminated or revoked.  Performed at [facility], [address] [place], [place] [address]     Procedures and diagnostic studies:  VAS US  LOWER EXTREMITY VENOUS (DVT) Result Date: 09/19/2024  Lower Venous DVT Study Patient Name:  Julia Solis [facility]  Date of Exam:   09/19/2024 Medical Rec #: [id number]          Accession #:    [id number] Date of Birth: [birth year]          Patient Gender: F Patient Age:   23 years Exam Location:  [facility] Procedure:      VAS US  LOWER EXTREMITY VENOUS (DVT) Referring Phys: TIMOTHY OPYD --------------------------------------------------------------------------------  Indications: Pulmonary embolism.  Risk Factors: Confirmed PE. Anticoagulation: Heparin . Comparison Study: No prior studies. Performing Technologist: Cordella Collet RVT  Examination Guidelines: A complete evaluation includes B-mode imaging, spectral Doppler, color Doppler, and power Doppler as needed of all accessible portions of each vessel. Bilateral testing is considered an integral part of a complete examination. Limited examinations for reoccurring indications may be performed as noted. The reflux portion of the exam is performed with the patient in reverse Trendelenburg.  +---------+---------------+---------+-----------+----------+--------------+ RIGHT    CompressibilityPhasicitySpontaneityPropertiesThrombus  Aging +---------+---------------+---------+-----------+----------+--------------+ CFV      Full           Yes      Yes                                 +---------+---------------+---------+-----------+----------+--------------+ SFJ      Full                                                        +---------+---------------+---------+-----------+----------+--------------+  FV Prox  Full                                                        +---------+---------------+---------+-----------+----------+--------------+ FV Mid   Full                                                        +---------+---------------+---------+-----------+----------+--------------+ FV DistalFull                                                        +---------+---------------+---------+-----------+----------+--------------+ PFV      Full                                                        +---------+---------------+---------+-----------+----------+--------------+ POP      Partial        Yes      Yes                  Acute          +---------+---------------+---------+-----------+----------+--------------+ PTV      Full                                                        +---------+---------------+---------+-----------+----------+--------------+ PERO     Full                                                        +---------+---------------+---------+-----------+----------+--------------+ Gastroc  Partial                                      Acute          +---------+---------------+---------+-----------+----------+--------------+   +---------+---------------+---------+-----------+----------+--------------+ LEFT     CompressibilityPhasicitySpontaneityPropertiesThrombus Aging +---------+---------------+---------+-----------+----------+--------------+ CFV      Full           Yes      Yes                                  +---------+---------------+---------+-----------+----------+--------------+ SFJ      Full                                                        +---------+---------------+---------+-----------+----------+--------------+ FV  Prox  Full                                                        +---------+---------------+---------+-----------+----------+--------------+ FV Mid   Full                                                        +---------+---------------+---------+-----------+----------+--------------+ FV DistalFull                                                        +---------+---------------+---------+-----------+----------+--------------+ PFV      Full                                                        +---------+---------------+---------+-----------+----------+--------------+ POP      Full           Yes      Yes                                 +---------+---------------+---------+-----------+----------+--------------+ PTV      Full                                                        +---------+---------------+---------+-----------+----------+--------------+ PERO     Full                                                        +---------+---------------+---------+-----------+----------+--------------+    Summary: RIGHT: - Findings consistent with acute deep vein thrombosis involving the right popliteal vein. Findings consistent with acute intramuscular thrombosis involving the right gastrocnemius veins. - No cystic structure found in the popliteal fossa.  LEFT: - There is no evidence of deep vein thrombosis in the lower extremity.  - No cystic structure found in the popliteal fossa.  *See table(s) above for measurements and observations.    Preliminary    CT Angio Chest PE W and/or Wo Contrast Result Date: 09/18/2024 EXAM: CTA of the Chest with contrast for PE 09/18/2024 08:46:26 PM TECHNIQUE: CTA of the chest was performed without and with  the administration of 75 mL of intravenous iohexol  (OMNIPAQUE ) 350 MG/ML injection. Multiplanar reformatted images are provided for review. MIP images are provided for review. Automated exposure control, iterative reconstruction, and/or weight based adjustment of the mA/kV was utilized to reduce the radiation dose to as low as reasonably achievable. COMPARISON: CT chest 06/02/2011. CLINICAL HISTORY: Pulmonary embolism (PE) suspected, high prob. FINDINGS: PULMONARY ARTERIES:  Pulmonary arteries are adequately opacified for evaluation. Pulmonary emboli are seen within segmental branches of the bilateral upper lobes as well as within segmental and subsegmental branches of the bilateral lower lobes and right middle lobe. Main pulmonary artery is enlarged. MEDIASTINUM: The heart is moderately enlarged, a new finding. Left sided pacemaker is present. There is no acute abnormality of the thoracic aorta. The thyroid  gland is heterogeneous diffusely. Isthmus nodule measures 17 mm, new from prior. There is a 3.3 x 1.7 cm low density nodule in the right paratracheal region which has increased in size. LYMPH NODES: There are no new enlarged hilar or axillary lymph nodes identified. LUNGS AND PLEURA: Chronic appearing pleural parenchymal opacities with bronchiectasis in the bilateral upper lobes superior unchanged from 2012. There is a new subsolid nodular density in the right lower lobe measuring 11 mm (image 6/99). Ground glass slightly nodular area measures 12 mm in the right lower lobe (image 6/104) which is new from prior. There is no pleural effusion or pneumothorax. UPPER ABDOMEN: There is a small hiatal hernia. There is a cyst in the left lobe of the liver measuring 17 mm. SOFT TISSUES AND BONES: No acute bone or soft tissue abnormality. IMPRESSION: 1. Pulmonary emboli within segmental branches of the bilateral upper lobes as well as within segmental and subsegmental branches of the bilateral lower lobes and right middle  lobe. 2. New subsolid pulmonary nodules in the right lower lobe measuring 11 mm and 12 mm; recommend CT at 3-6 months to confirm persistence, and if unchanged with solid component 5 mm, then annual CT for 5 years, as per Fleischner Society Guidelines. 3. Moderate cardiomegaly. 4. Enlarged main pulmonary artery. 5. Enlarging right paratracheal lymph node. 6. Stable chronic parenchymal opacities in the bilateral upper lobes. Electronically signed by: Greig Pique MD 09/18/2024 09:12 PM EST RP Workstation: [id number]               LOS: 1 day   Adrionna Delcid  [facility]   Pager on [email or web address]. If 7PM-7AM, please contact night-coverage at [email or web address]     09/19/2024, 12:01 PM

## 2024-09-19 NOTE — Progress Notes (Signed)
 PHARMACY - ANTICOAGULATION CONSULT NOTE  Pharmacy Consult for heparin  Indication: pulmonary embolus  Allergies[1]  Patient Measurements: Weight: 82 kg (180 lb 12.4 oz)  Vital Signs: Temp: 98 F (36.7 C) (12/16 0400) Temp Source: Oral (12/16 0400) BP: 143/66 (12/16 0600) Pulse Rate: 70 (12/16 0600)  Labs: Recent Labs    09/18/24 1802 09/18/24 2020 09/19/24 0405 09/19/24 0630  HGB 12.2  --  11.8*  --   HCT 39.8  --  38.3  --   PLT 259  --  242  --   HEPARINUNFRC  --   --   --  0.30  CREATININE 1.09*  --  0.91  --   TROPONINIHS 290* 311* 363*  --     Estimated Creatinine Clearance: 58.5 mL/min (by C-G formula based on SCr of 0.91 mg/dL).   Medical History: Past Medical History:  Diagnosis Date   Arthritis 2024   Cancer [facility]) 2011   ovarian   Cataract 2024   Complication of anesthesia    GERD (gastroesophageal reflux disease)    Hearing aid worn    both ears   History of chemotherapy 2011   History of hiatal hernia    History of kidney stones    Hypertension    MGUS (monoclonal gammopathy of unknown significance)    dx by hematology per patient   Neuromuscular disorder (HCC)    left foot numbness since  eback surgery   Osteoporosis    PONV (postoperative nausea and vomiting)    Presence of permanent cardiac pacemaker    Sarcoidosis    in remission sees pumonary  once a year dr bellinger lov 08-21-2020 care everywhere   Sick sinus syndrome [facility])    Sleep apnea 2024   Wears glasses     Assessment: 73 YOF presenting with SOB, CT angio chest with PE, she is not on anticoagulation PTA, CBC wnl  12/16 AM: Heparin  level therapeutic at 0.30 on 1250 units/hr.   Goal of Therapy:  Heparin  level 0.3-0.7 units/ml Monitor platelets by anticoagulation protocol: Yes   Plan:  Increase heparin  to 1350 units/hr to maintain in therapeutic range.  F/u confirmatory 8 hour heparin  level F/u long term AC plan  Rankin Sams, PharmD, BCPS, BCCCP Clinical  Pharmacist      [1]  Allergies Allergen Reactions   Augmentin  [Amoxicillin -Pot Clavulanate]     Diarrhea    Flomax [Tamsulosin Hcl] Other (See Comments)    Dizziness   Latex Other (See Comments)    Skin irritation   Tramadol  Other (See Comments)    jittery, insomnia   Ciprofloxacin Rash

## 2024-09-19 NOTE — ED Notes (Signed)
 After RN went in to give pt her 10 mg cortef ,Pt says she took her home medicine cortef  and hospitalist okayd it. Pt says she took her home medicine cortef  and hospitalist okayd it.  Ayiku MD secure chatted for clarification.

## 2024-09-19 NOTE — ED Notes (Signed)
 Pt placed on 2 L of oxygen  due to desaturating down to 86% in sleep.

## 2024-09-19 NOTE — Progress Notes (Signed)
 Echocardiogram 2D Echocardiogram has been performed.  Julia Solis 09/19/2024, 2:08 PM

## 2024-09-19 NOTE — ED Notes (Signed)
 Pt satting 88% RA while sleeping, pt declined cpap at this time as she often has discomfort with most cpap mask and request to try 2L [place] while sleeping, 95% 2L [place].

## 2024-09-19 NOTE — ED Notes (Signed)
Vascular at [facility] 

## 2024-09-19 NOTE — Progress Notes (Signed)
 Bilateral lower extremity venous duplex has been completed. Preliminary results can be found in CV Proc through chart review.  Results were given to Dr. Jens.  09/19/2024 8:58 AM Cathlyn Collet RVT

## 2024-09-19 NOTE — ED Notes (Signed)
 Assisted pt to restroom. Pt ambulated independently with use of cane from home. Pt's oxygen  saturation decreased from 98% RA to 93% RA. Once pt resting back in treatment room on stretcher oxygen  saturation returned to 98% RA within 3 minutes. Pt states she feels much better than yesterday.

## 2024-09-20 ENCOUNTER — Telehealth (HOSPITAL_COMMUNITY): Payer: Self-pay

## 2024-09-20 ENCOUNTER — Other Ambulatory Visit (HOSPITAL_COMMUNITY): Payer: Self-pay

## 2024-09-20 DIAGNOSIS — I2699 Other pulmonary embolism without acute cor pulmonale: Secondary | ICD-10-CM | POA: Diagnosis present

## 2024-09-20 LAB — CBC
HCT: 35.1 % — ABNORMAL LOW (ref 36.0–46.0)
Hemoglobin: 10.8 g/dL — ABNORMAL LOW (ref 12.0–15.0)
MCH: 28.3 pg (ref 26.0–34.0)
MCHC: 30.8 g/dL (ref 30.0–36.0)
MCV: 92.1 fL (ref 80.0–100.0)
Platelets: 212 K/uL (ref 150–400)
RBC: 3.81 MIL/uL — ABNORMAL LOW (ref 3.87–5.11)
RDW: 18.5 % — ABNORMAL HIGH (ref 11.5–15.5)
WBC: 12.1 K/uL — ABNORMAL HIGH (ref 4.0–10.5)
nRBC: 0.2 % (ref 0.0–0.2)

## 2024-09-20 LAB — BASIC METABOLIC PANEL WITH GFR
Anion gap: 9 (ref 5–15)
BUN: 20 mg/dL (ref 8–23)
CO2: 25 mmol/L (ref 22–32)
Calcium: 8.2 mg/dL — ABNORMAL LOW (ref 8.9–10.3)
Chloride: 105 mmol/L (ref 98–111)
Creatinine, Ser: 0.71 mg/dL (ref 0.44–1.00)
GFR, Estimated: >60 mL/min (ref >60)
Glucose, Bld: 95 mg/dL (ref 70–99)
Potassium: 3.2 mmol/L — ABNORMAL LOW (ref 3.5–5.1)
Sodium: 138 mmol/L (ref 135–145)

## 2024-09-20 LAB — HEPARIN LEVEL (UNFRACTIONATED): Heparin Unfractionated: 0.4 [IU]/mL (ref 0.30–0.70)

## 2024-09-20 MED ORDER — APIXABAN 5 MG PO TABS: 5.0000 mg | ORAL_TABLET | Freq: Two times a day (BID) | ORAL | Status: DC

## 2024-09-20 MED ORDER — POTASSIUM CHLORIDE CRYS ER 20 MEQ PO TBCR
40.0000 meq | EXTENDED_RELEASE_TABLET | Freq: Once | ORAL | Status: AC
  Administered 2024-09-20: 09:00:00 40 meq via ORAL
  Filled 2024-09-20: qty 2

## 2024-09-20 MED ORDER — APIXABAN 5 MG PO TABS
ORAL_TABLET | ORAL | 0 refills | Status: DC
  Filled 2024-09-20: qty 60, 24d supply, fill #0

## 2024-09-20 MED ORDER — APIXABAN 5 MG PO TABS
10.0000 mg | ORAL_TABLET | Freq: Two times a day (BID) | ORAL | Status: DC
  Administered 2024-09-20: 12:00:00 10 mg via ORAL
  Filled 2024-09-20: qty 2

## 2024-09-20 NOTE — Discharge Instructions (Signed)
-   Follow up with your hematologist - Take the blood thinner eliquis  as ordered

## 2024-09-20 NOTE — Discharge Summary (Signed)
 Physician Discharge Summary   Patient: Julia Solis MRN: [id number] DOB: 24-Apr-1951  Admit date:     09/18/2024  Discharge date: 09/20/2024  Discharge Physician: Casimer Dare   PCP: Perri Ronal PARAS, MD   Recommendations at discharge:    Follow up with primary hematologist   Discharge Diagnoses: Principal Problem:   Acute pulmonary embolism (HCC) Active Problems:   Hypertension   Insomnia   Seronegative rheumatoid arthritis (HCC)   Sarcoidosis   Sleep apnea   Lung nodule   Elevated troponin   Adrenal insufficiency   Pulmonary embolism [facility])   Hospital Course: 73 y.o. female with medical history significant for hypertension, hyperlipidemia, OSA, MGUS, sick sinus syndrome with pacemaker, insomnia, sarcoidosis in remission, and rheumatoid arthritis, who presented to the hospital with shortness of breath.  Shortness of breath had been going on for about a week.  She had a long road trip in September 2025 when she traveled for about 500 miles.  No long road trips since then.  CTA showed pulmonary emboli within segmental branches of the bilateral upper lobes, as well as within segmental and subsegmental branches, new subsolid pulmonary nodules.  Venous duplex showed DVT in the right popliteal vein.  Labs also showed elevated troponin which is likely due to the demand ischemia from her PE.  She was initially started on heparin  drip, and transition to Eliquis  which she tolerated.  TTE was completed that showed D-shaped septum suggesting RV overload.  Her PE/DVT is likely provoked in the setting of her long travel.  She will follow-up with her primary hematologist.  All her home meds including prednisone , hydrocortisone  for adrenal insufficiency were resumed.       Consultants: None Procedures performed: None  Disposition: Home Diet recommendation:  Regular diet DISCHARGE MEDICATION: Allergies as of 09/20/2024       Reactions   Augmentin  [amoxicillin -pot Clavulanate] Nausea  And Vomiting, Other (See Comments)   Diarrhea    Latex Other (See Comments)   Skin irritation   Tramadol  Other (See Comments)   jittery, insomnia   Ciprofloxacin Rash   Flomax [tamsulosin Hcl] Other (See Comments)   Dizziness        Medication List     TAKE these medications    alendronate  70 MG tablet Commonly known as: Fosamax  Take 1 tablet (70 mg total) by mouth once a week. Take with a full glass of water on an empty stomach. What changed: when to take this   apixaban  5 MG Tabs tablet Commonly known as: ELIQUIS  Take 2 tablets (10 mg total) by mouth 2 (two) times daily for 6 days, THEN 1 tablet (5 mg total) 2 (two) times daily for 24 days. Start taking on: September 20, 2024   Enbrel  Mini 50 MG/ML injection Generic drug: etanercept  Inject 1 mL (50 mg total) into the skin once a week. What changed:  when to take this additional instructions   famotidine  20 MG tablet Commonly known as: PEPCID  Take 20 mg by mouth at bedtime.   fluticasone 50 MCG/ACT nasal spray Commonly known as: FLONASE Place 1 spray into the nose every Monday, Wednesday, and Friday.   folic acid  1 MG tablet Commonly known as: FOLVITE  Take 2 tablets (2 mg total) by mouth daily. What changed: when to take this   furosemide  20 MG tablet Commonly known as: LASIX  Take 1 tablet (20 mg total) by mouth daily. What changed:  when to take this reasons to take this   hydrocortisone  10 MG tablet  Commonly known as: CORTEF  Take three tablets by mouth daily between 6 am to 8 am. Take one tablet by mouth around 2 pm daily.   Iron 325 (65 Fe) MG Tabs Take 1 tablet by mouth daily with supper.   LORazepam  1 MG tablet Commonly known as: ATIVAN  TAKE 1 TABLET BY MOUTH EVERY DAY AT BEDTIME AS NEEDED What changed: See the new instructions.   losartan  100 MG tablet Commonly known as: COZAAR  TAKE 1 TABLET BY MOUTH EVERY DAY   methotrexate  250 MG/10ML injection Inject 20 mg into the skin every Monday.  INJECT 0.8 MLS (20 MG TOTAL) INTO THE SKIN ONCE A WEEK FOR 8 DOSES. VIAL SHOULD ACCOMMODATE UP TO 12 DOSES OF MEDICATION.   multivitamin with minerals Tabs tablet Take 1 tablet by mouth daily with supper.   oxyCODONE  5 MG immediate release tablet Commonly known as: Oxy IR/ROXICODONE  Take 5 mg by mouth every 4 (four) hours as needed for moderate pain (pain score 4-6), severe pain (pain score 7-10) or breakthrough pain.   predniSONE  5 MG tablet Commonly known as: DELTASONE  Take 5-7.5 mg by mouth daily with breakfast. Take one and one-half tablets (7.5mg ) by mouth once daily in the morning with breakfast.  May take an additional one tablet mg) by mouth as needed for active symptoms.   rosuvastatin  5 MG tablet Commonly known as: CRESTOR  TAKE 1 TABLET BY MOUTH 3 TIMES A WEEK WITH SUPPER What changed: See the new instructions.   sodium chloride  0.65 % Soln nasal spray Commonly known as: OCEAN Place 1 spray into both nostrils as needed for congestion.   Systane 0.4-0.3 % Gel ophthalmic gel Generic drug: Polyethyl Glycol-Propyl Glycol Place 1 application  into both eyes at bedtime.   Systane Ultra 0.4-0.3 % Soln Generic drug: Polyethyl Glycol-Propyl Glycol Place 1 drop into both eyes every morning.   Vitamin D  50 MCG (2000 UT) tablet Take 2,000 Units by mouth daily.        Discharge Exam: Filed Weights   09/18/24 1731  Weight: 82 kg   Physical Exam Vitals and nursing note reviewed.  Constitutional:      General: She is not in acute distress. Cardiovascular:     Rate and Rhythm: Normal rate and regular rhythm.  Pulmonary:     Effort: No respiratory distress.     Breath sounds: No wheezing or rales.  Abdominal:     General: There is no distension.     Tenderness: There is no abdominal tenderness.  Musculoskeletal:     Right lower leg: No edema.     Left lower leg: No edema.      Condition at discharge: good  The results of significant diagnostics from this  hospitalization (including imaging, microbiology, ancillary and laboratory) are listed below for reference.   Imaging Studies: VAS US  LOWER EXTREMITY VENOUS (DVT) Result Date: 09/19/2024  Lower Venous DVT Study Patient Name:  Julia ANNE [facility] [place]  Date of Exam:   09/19/2024 Medical Rec #: [id number]          Accession #:    [id number] Date of Birth: January 03, 1951          Patient Gender: F Patient Age:   44 years Exam Location:  [facility] Procedure:      VAS US  LOWER EXTREMITY VENOUS (DVT) Referring Phys: TIMOTHY OPYD --------------------------------------------------------------------------------  Indications: Pulmonary embolism.  Risk Factors: Confirmed PE. Anticoagulation: Heparin . Comparison Study: No prior studies. Performing Technologist: Cordella Collet RVT  Examination Guidelines: A complete evaluation  includes B-mode imaging, spectral Doppler, color Doppler, and power Doppler as needed of all accessible portions of each vessel. Bilateral testing is considered an integral part of a complete examination. Limited examinations for reoccurring indications may be performed as noted. The reflux portion of the exam is performed with the patient in reverse Trendelenburg.  +---------+---------------+---------+-----------+----------+--------------+ RIGHT    CompressibilityPhasicitySpontaneityPropertiesThrombus Aging +---------+---------------+---------+-----------+----------+--------------+ CFV      Full           Yes      Yes                                 +---------+---------------+---------+-----------+----------+--------------+ SFJ      Full                                                        +---------+---------------+---------+-----------+----------+--------------+ FV Prox  Full                                                        +---------+---------------+---------+-----------+----------+--------------+ FV Mid   Full                                                         +---------+---------------+---------+-----------+----------+--------------+ FV DistalFull                                                        +---------+---------------+---------+-----------+----------+--------------+ PFV      Full                                                        +---------+---------------+---------+-----------+----------+--------------+ POP      Partial        Yes      Yes                  Acute          +---------+---------------+---------+-----------+----------+--------------+ PTV      Full                                                        +---------+---------------+---------+-----------+----------+--------------+ PERO     Full                                                        +---------+---------------+---------+-----------+----------+--------------+  Gastroc  Partial                                      Acute          +---------+---------------+---------+-----------+----------+--------------+   +---------+---------------+---------+-----------+----------+--------------+ LEFT     CompressibilityPhasicitySpontaneityPropertiesThrombus Aging +---------+---------------+---------+-----------+----------+--------------+ CFV      Full           Yes      Yes                                 +---------+---------------+---------+-----------+----------+--------------+ SFJ      Full                                                        +---------+---------------+---------+-----------+----------+--------------+ FV Prox  Full                                                        +---------+---------------+---------+-----------+----------+--------------+ FV Mid   Full                                                        +---------+---------------+---------+-----------+----------+--------------+ FV DistalFull                                                         +---------+---------------+---------+-----------+----------+--------------+ PFV      Full                                                        +---------+---------------+---------+-----------+----------+--------------+ POP      Full           Yes      Yes                                 +---------+---------------+---------+-----------+----------+--------------+ PTV      Full                                                        +---------+---------------+---------+-----------+----------+--------------+ PERO     Full                                                        +---------+---------------+---------+-----------+----------+--------------+  Summary: RIGHT: - Findings consistent with acute deep vein thrombosis involving the right popliteal vein. Findings consistent with acute intramuscular thrombosis involving the right gastrocnemius veins. - No cystic structure found in the popliteal fossa.  LEFT: - There is no evidence of deep vein thrombosis in the lower extremity.  - No cystic structure found in the popliteal fossa.  *See table(s) above for measurements and observations. Electronically signed by Lonni Gaskins MD on 09/19/2024 at 4:21:46 PM.    Final    ECHOCARDIOGRAM COMPLETE Result Date: 09/19/2024    ECHOCARDIOGRAM REPORT   Patient Name:   Temeka ANNE [facility] Date of Exam: 09/19/2024 Medical Rec #:  [id number]         Height:       64.5 in Accession #:    [id number]        Weight:       180.8 lb Date of Birth:  1951-08-27         BSA:          1.884 m Patient Age:    73 years          BP:           157/82 mmHg Patient Gender: F                 HR:           83 bpm. Exam Location:  Inpatient Procedure: 2D Echo, Cardiac Doppler and Color Doppler (Both Spectral and Color            Flow Doppler were utilized during procedure). Indications:    Pulmonary embolus 126.09; elevated troponin  History:        Patient has prior history of Echocardiogram examinations, most                  recent 06/09/2023. Pacemaker; Risk Factors:Hypertension, Sleep                 Apnea and Former Smoker.  Sonographer:    Merlynn Argyle Referring Phys: [id number] TIMOTHY S OPYD IMPRESSIONS  1. Left ventricular ejection fraction, by estimation, is 60 to 65%. The left ventricle has normal function. The left ventricle has no regional wall motion abnormalities. There is moderate concentric left ventricular hypertrophy. Left ventricular diastolic parameters are consistent with Grade I diastolic dysfunction (impaired relaxation). There is the interventricular septum is flattened in diastole ('D' shaped left ventricle), consistent with right ventricular volume overload.  2. Right ventricular systolic function is normal. The right ventricular size is moderately enlarged. There is moderately elevated pulmonary artery systolic pressure. The estimated right ventricular systolic pressure is 48.2 mmHg.  3. Left atrial size was mild to moderately dilated.  4. Right atrial size was moderately dilated.  5. The mitral valve is normal in structure. No evidence of mitral valve regurgitation. No evidence of mitral stenosis.  6. Tricuspid valve regurgitation is moderate.  7. The aortic valve is tricuspid. Aortic valve regurgitation is not visualized. Aortic valve sclerosis/calcification is present, without any evidence of aortic stenosis.  8. The inferior vena cava is normal in size with greater than 50% respiratory variability, suggesting right atrial pressure of 3 mmHg. Comparison(s): Prior images reviewed side by side. Compared to the previous study the right ventricle is dilated and the systolic pulmonary artery pressure is higher, but right ventricular systolic function remains preserved. FINDINGS  Left Ventricle: Left ventricular ejection fraction, by estimation, is 60 to 65%. The left ventricle has normal function. The left ventricle  has no regional wall motion abnormalities. The left ventricular internal cavity size was normal  in size. There is  moderate concentric left ventricular hypertrophy. The interventricular septum is flattened in diastole ('D' shaped left ventricle), consistent with right ventricular volume overload. Left ventricular diastolic parameters are consistent with Grade I diastolic dysfunction (impaired relaxation). Normal left ventricular filling pressure. Right Ventricle: The right ventricular size is moderately enlarged. No increase in right ventricular wall thickness. Right ventricular systolic function is normal. There is moderately elevated pulmonary artery systolic pressure. The tricuspid regurgitant  velocity is 3.36 m/s, and with an assumed right atrial pressure of 3 mmHg, the estimated right ventricular systolic pressure is 48.2 mmHg. Left Atrium: Left atrial size was mild to moderately dilated. Right Atrium: Right atrial size was moderately dilated. Pericardium: Trivial pericardial effusion is present. Mitral Valve: The mitral valve is normal in structure. No evidence of mitral valve regurgitation. No evidence of mitral valve stenosis. Tricuspid Valve: The tricuspid valve is normal in structure. Tricuspid valve regurgitation is moderate. Aortic Valve: The aortic valve is tricuspid. Aortic valve regurgitation is not visualized. Aortic valve sclerosis/calcification is present, without any evidence of aortic stenosis. Pulmonic Valve: The pulmonic valve was normal in structure. Pulmonic valve regurgitation is trivial. No evidence of pulmonic stenosis. Aorta: The aortic root is normal in size and structure. Venous: The inferior vena cava is normal in size with greater than 50% respiratory variability, suggesting right atrial pressure of 3 mmHg. IAS/Shunts: No atrial level shunt detected by color flow Doppler. Additional Comments: A device lead is visualized in the right atrium and right ventricle.  LEFT VENTRICLE PLAX 2D LVIDd:         4.20 cm   Diastology LVIDs:         2.80 cm   LV e' medial:    5.13 cm/s LV PW:          1.40 cm   LV E/e' medial:  9.5 LV IVS:        1.20 cm   LV e' lateral:   8.08 cm/s LVOT diam:     1.90 cm   LV E/e' lateral: 6.0 LV SV:         66 LV SV Index:   35 LVOT Area:     2.84 cm LV IVRT:       85 msec  RIGHT VENTRICLE             IVC RV Basal diam:  4.40 cm     IVC diam: 1.70 cm RV Mid diam:    4.10 cm RV S prime:     12.70 cm/s TAPSE (M-mode): 1.7 cm LEFT ATRIUM           Index        RIGHT ATRIUM           Index LA diam:      4.80 cm 2.55 cm/m   RA Area:     28.58 cm 15.17 cm/m LA Vol (A2C): 54.4 ml 28.87 ml/m  RA Volume:   75.18 ml  39.90 ml/m LA Vol (A4C): 45.6 ml 24.22 ml/m  AORTIC VALVE             PULMONIC VALVE LVOT Vmax:   143.00 cm/s PR End Diast Vel: 8.88 msec LVOT Vmean:  81.700 cm/s LVOT VTI:    0.232 m  AORTA Ao Root diam: 3.20 cm Ao Asc diam:  2.90 cm MITRAL VALVE  TRICUSPID VALVE MV Area (PHT): 2.29 cm    TR Peak grad:   45.2 mmHg MV Decel Time: 331 msec    TR Vmax:        336.00 cm/s MV E velocity: 48.70 cm/s MV A velocity: 92.40 cm/s  SHUNTS MV E/A ratio:  0.53        Systemic VTI:  0.23 m                            Systemic Diam: 1.90 cm Mihai Croitoru MD Electronically signed by Jerel Balding MD Signature Date/Time: 09/19/2024/4:05:16 PM    Final    CT Angio Chest PE W and/or Wo Contrast Result Date: 09/18/2024 EXAM: CTA of the Chest with contrast for PE 09/18/2024 08:46:26 PM TECHNIQUE: CTA of the chest was performed without and with the administration of 75 mL of intravenous iohexol  (OMNIPAQUE ) 350 MG/ML injection. Multiplanar reformatted images are provided for review. MIP images are provided for review. Automated exposure control, iterative reconstruction, and/or weight based adjustment of the mA/kV was utilized to reduce the radiation dose to as low as reasonably achievable. COMPARISON: CT chest 06/02/2011. CLINICAL HISTORY: Pulmonary embolism (PE) suspected, high prob. FINDINGS: PULMONARY ARTERIES: Pulmonary arteries are adequately opacified for  evaluation. Pulmonary emboli are seen within segmental branches of the bilateral upper lobes as well as within segmental and subsegmental branches of the bilateral lower lobes and right middle lobe. Main pulmonary artery is enlarged. MEDIASTINUM: The heart is moderately enlarged, a new finding. Left sided pacemaker is present. There is no acute abnormality of the thoracic aorta. The thyroid  gland is heterogeneous diffusely. Isthmus nodule measures 17 mm, new from prior. There is a 3.3 x 1.7 cm low density nodule in the right paratracheal region which has increased in size. LYMPH NODES: There are no new enlarged hilar or axillary lymph nodes identified. LUNGS AND PLEURA: Chronic appearing pleural parenchymal opacities with bronchiectasis in the bilateral upper lobes superior unchanged from 2012. There is a new subsolid nodular density in the right lower lobe measuring 11 mm (image 6/99). Ground glass slightly nodular area measures 12 mm in the right lower lobe (image 6/104) which is new from prior. There is no pleural effusion or pneumothorax. UPPER ABDOMEN: There is a small hiatal hernia. There is a cyst in the left lobe of the liver measuring 17 mm. SOFT TISSUES AND BONES: No acute bone or soft tissue abnormality. IMPRESSION: 1. Pulmonary emboli within segmental branches of the bilateral upper lobes as well as within segmental and subsegmental branches of the bilateral lower lobes and right middle lobe. 2. New subsolid pulmonary nodules in the right lower lobe measuring 11 mm and 12 mm; recommend CT at 3-6 months to confirm persistence, and if unchanged with solid component 5 mm, then annual CT for 5 years, as per Fleischner Society Guidelines. 3. Moderate cardiomegaly. 4. Enlarged main pulmonary artery. 5. Enlarging right paratracheal lymph node. 6. Stable chronic parenchymal opacities in the bilateral upper lobes. Electronically signed by: Greig Pique MD 09/18/2024 09:12 PM EST RP Workstation: [id number]     Microbiology: Results for orders placed or performed during the hospital encounter of 09/18/24  Resp panel by RT-PCR (RSV, Flu A&B, Covid) Anterior Nasal Swab     Status: None   Collection Time: 09/18/24  6:02 PM   Specimen: Anterior Nasal Swab  Result Value Ref Range Status   SARS Coronavirus 2 by RT PCR NEGATIVE NEGATIVE Final   Influenza  A by PCR NEGATIVE NEGATIVE Final   Influenza B by PCR NEGATIVE NEGATIVE Final    Comment: (NOTE) The Xpert Xpress SARS-CoV-2/FLU/RSV plus assay is intended as an aid in the diagnosis of influenza from Nasopharyngeal swab specimens and should not be used as a sole basis for treatment. Nasal washings and aspirates are unacceptable for Xpert Xpress SARS-CoV-2/FLU/RSV testing.  Fact Sheet for Patients: bloggercourse.com  Fact Sheet for Healthcare Providers: seriousbroker.it  This test is not yet approved or cleared by the [place]  FDA and has been authorized for detection and/or diagnosis of SARS-CoV-2 by FDA under an Emergency Use Authorization (EUA). This EUA will remain in effect (meaning this test can be used) for the duration of the COVID-19 declaration under Section 564(b)(1) of the Act, 21 U.S.C. section 360bbb-3(b)(1), unless the authorization is terminated or revoked.     Resp Syncytial Virus by PCR NEGATIVE NEGATIVE Final    Comment: (NOTE) Fact Sheet for Patients: bloggercourse.com  Fact Sheet for Healthcare Providers: seriousbroker.it  This test is not yet approved or cleared by the [place]  FDA and has been authorized for detection and/or diagnosis of SARS-CoV-2 by FDA under an Emergency Use Authorization (EUA). This EUA will remain in effect (meaning this test can be used) for the duration of the COVID-19 declaration under Section 564(b)(1) of the Act, 21 U.S.C. section 360bbb-3(b)(1), unless the authorization is  terminated or revoked.  Performed at [facility], [address], [place], [place] [address]     Labs: CBC: Recent Labs  Lab 09/18/24 1802 09/19/24 0405 09/20/24 0357  WBC 12.9* 12.6* 12.1*  HGB 12.2 11.8* 10.8*  HCT 39.8 38.3 35.1*  MCV 92.6 92.7 92.1  PLT 259 242 212   Basic Metabolic Panel: Recent Labs  Lab 09/18/24 1802 09/19/24 0405 09/20/24 0357  NA 144 143 138  K 4.4 3.3* 3.2*  CL 105 107 105  CO2 27 27 25   GLUCOSE 117* 106* 95  BUN 25* 25* 20  CREATININE 1.09* 0.91 0.71  CALCIUM  8.6* 8.0* 8.2*   Liver Function Tests: Recent Labs  Lab 09/18/24 1802  AST 35  ALT 18  ALKPHOS 58  BILITOT 1.3*  PROT 5.9*  ALBUMIN 3.1*   CBG: No results for input(s): GLUCAP in the last 168 hours.  Discharge time spent: 73  Signed: Casimer Dare, MD [facility] 09/20/2024

## 2024-09-20 NOTE — ED Notes (Signed)
 Pt ambualtory to and from the bathroom. Pt readjusted in bed. Reconnected to vs monitor. Call bell within reach. Pt denies any additional needs.

## 2024-09-20 NOTE — ED Notes (Signed)
 Pt leaving after paper work reviewed. Pt leaving to pick up pharmacy med then leave with daughter. Pt is in no new onset distress at this time.

## 2024-09-20 NOTE — ED Notes (Addendum)
Awaiting paperwork for discharge.

## 2024-09-20 NOTE — Telephone Encounter (Signed)
 Pharmacy Patient Advocate Encounter  Insurance verification completed.    The patient is insured through [employer]/ [employer]. Patient has Medicare and is not eligible for a copay card, but may be able to apply for patient assistance or Medicare RX Payment Plan (Patient Must reach out to their plan, if eligible for payment plan), if available.    Ran test claim for Eliquis  5mg  tablet and the current 30 day co-pay is $0.   This test claim was processed through [employer]- copay amounts may vary at other pharmacies due to [employer], or as the patient moves through the different stages of their insurance plan.

## 2024-09-21 ENCOUNTER — Other Ambulatory Visit: Payer: Self-pay | Admitting: Rheumatology

## 2024-09-21 NOTE — Telephone Encounter (Signed)
 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 weekly since April 2025.   Okay to refill Fosamax ?

## 2024-09-22 ENCOUNTER — Telehealth: Payer: Self-pay | Admitting: *Deleted

## 2024-09-22 ENCOUNTER — Telehealth: Payer: Self-pay | Admitting: Oncology

## 2024-09-22 DIAGNOSIS — Z79899 Other long term (current) drug therapy: Secondary | ICD-10-CM

## 2024-09-22 NOTE — Telephone Encounter (Signed)
 Reviewed discharge summary. Plan to further discuss at upcoming visit.

## 2024-09-22 NOTE — Addendum Note (Signed)
 Addended by: CENA ALFONSO CROME on: 09/22/2024 03:15 PM   Modules accepted: Orders

## 2024-09-22 NOTE — Telephone Encounter (Signed)
 Patient advised reviewed discharge summary. Plan to further discuss at upcoming visit.

## 2024-09-22 NOTE — Telephone Encounter (Signed)
 Called PT to schedule FU appt, day and time confirmed.

## 2024-09-22 NOTE — Telephone Encounter (Signed)
 Patient contacted the office and left message stating that she was recently in the hospital. Patient states she has a DVT and a pulmonary emboli. Patient states she was prescribed Eliquis . Patient states she has a follow up visit on 10/12/2024.

## 2024-09-25 ENCOUNTER — Encounter: Payer: Self-pay | Admitting: Internal Medicine

## 2024-09-25 ENCOUNTER — Ambulatory Visit: Admitting: Internal Medicine

## 2024-09-25 VITALS — BP 150/80 | HR 68 | Ht 64.5 in | Wt 185.0 lb

## 2024-09-25 DIAGNOSIS — E785 Hyperlipidemia, unspecified: Secondary | ICD-10-CM | POA: Diagnosis not present

## 2024-09-25 DIAGNOSIS — Z950 Presence of cardiac pacemaker: Secondary | ICD-10-CM | POA: Diagnosis not present

## 2024-09-25 DIAGNOSIS — K219 Gastro-esophageal reflux disease without esophagitis: Secondary | ICD-10-CM

## 2024-09-25 DIAGNOSIS — I2699 Other pulmonary embolism without acute cor pulmonale: Secondary | ICD-10-CM | POA: Diagnosis not present

## 2024-09-25 DIAGNOSIS — Z8679 Personal history of other diseases of the circulatory system: Secondary | ICD-10-CM

## 2024-09-25 DIAGNOSIS — M810 Age-related osteoporosis without current pathological fracture: Secondary | ICD-10-CM

## 2024-09-25 DIAGNOSIS — D860 Sarcoidosis of lung: Secondary | ICD-10-CM | POA: Diagnosis not present

## 2024-09-25 DIAGNOSIS — I10 Essential (primary) hypertension: Secondary | ICD-10-CM | POA: Diagnosis not present

## 2024-09-25 DIAGNOSIS — R600 Localized edema: Secondary | ICD-10-CM | POA: Diagnosis not present

## 2024-09-25 DIAGNOSIS — H905 Unspecified sensorineural hearing loss: Secondary | ICD-10-CM

## 2024-09-25 DIAGNOSIS — E7800 Pure hypercholesterolemia, unspecified: Secondary | ICD-10-CM

## 2024-09-25 DIAGNOSIS — G4700 Insomnia, unspecified: Secondary | ICD-10-CM | POA: Diagnosis not present

## 2024-09-25 DIAGNOSIS — R5383 Other fatigue: Secondary | ICD-10-CM

## 2024-09-25 DIAGNOSIS — Z8543 Personal history of malignant neoplasm of ovary: Secondary | ICD-10-CM

## 2024-09-25 DIAGNOSIS — M0600 Rheumatoid arthritis without rheumatoid factor, unspecified site: Secondary | ICD-10-CM

## 2024-09-25 DIAGNOSIS — R7989 Other specified abnormal findings of blood chemistry: Secondary | ICD-10-CM

## 2024-09-25 LAB — COMPREHENSIVE METABOLIC PANEL WITH GFR
AG Ratio: 1.6 (calc) (ref 1.0–2.5)
ALT: 11 U/L (ref 6–29)
AST: 17 U/L (ref 10–35)
Albumin: 3.7 g/dL (ref 3.6–5.1)
Alkaline phosphatase (APISO): 68 U/L (ref 37–153)
BUN: 16 mg/dL (ref 7–25)
CO2: 30 mmol/L (ref 20–32)
Calcium: 9.1 mg/dL (ref 8.6–10.4)
Chloride: 107 mmol/L (ref 98–110)
Creat: 0.77 mg/dL (ref 0.60–1.00)
Globulin: 2.3 g/dL (ref 1.9–3.7)
Glucose, Bld: 97 mg/dL (ref 65–99)
Potassium: 4.6 mmol/L (ref 3.5–5.3)
Sodium: 143 mmol/L (ref 135–146)
Total Bilirubin: 0.5 mg/dL (ref 0.2–1.2)
Total Protein: 6 g/dL — ABNORMAL LOW (ref 6.1–8.1)
eGFR: 82 mL/min/1.73m2

## 2024-09-25 NOTE — Progress Notes (Addendum)
 "   Patient Care Team: Ladelle Teodoro, Ronal PARAS, MD as PCP - General (Internal Medicine) Mealor, Eulas BRAVO, MD as Consulting Physician (Cardiology) Dolphus Reiter, MD as Consulting Physician (Rheumatology)  Visit Date: 09/25/2024  Subjective:    Patient ID: Julia Solis , Female   DOB: [birth year], 73 y.o.    MRN: [id number]   73 y.o. Female presents today for Hospital follow up. Patient has a past medical history of OSA, GE Reflux, Hyperlipidemia, Dependent Edema, Hypertension, seronegative rheumatoid arthritis,  On December 12 she [facility] of breath. She had been experiencing shortness of breath for approximately 2 weeks, which she attributes to a severe cold that began in 07/2024.She had a long road trip in September 2025 when she traveled for about 500 miles. No long road trips since then.  The onset of her symptoms was gradual, and she has not previously experienced such breathlessness. At Urgent Care, Pulse  was 102, respiratory rate  was 24  and pulse ox 96% on room air. Pulse ox  was 91%  with walking.Chest x-ray reviewed: No pneumonia, pneumothorax, or pleural effusion. Stable dual chamber pacer, scattered scar in lung apices. No changes noted. Decadron  10 mg IM administered. Duo-Neb nebulization was administered, with noted improvement in breathing and airways.Twelve-lead EKG completed: Normal sinus rhythm, no arrhythmias or evidence of ischemia.  She was prescribed Breo  Ellipta once daily, albuterol  inhaler every 4-6 hours as needed for shortness of breath, doxycycline  100 mg twice daily for 7 days. She was diagnosed with acute sinusitis and reactive airways.   On December 15 she presented to this office still short of breath. Walking around the office she desaturated to 71%. She was shortly sent to the hospital after her visit.  At the hospital, CTA showed pulmonary emboli within segmental branches of the bilateral upper lobes, as well  as within segmental and subsegmental branches, new subsolid pulmonary nodules.  Venous duplex showed DVT in the right popliteal vein.  Labs also showed elevated troponin which is likely due to the demand ischemia from her PE. Troponin was 290 and 311 on 09/18/2024 and 363 on 09/19/2024. 09/18/2024 ECG Left ejection fraction 60-65%. moderate concentric LVH. Evidence of right ventricular volume overload. Mitral valve normal. Tricuspid valve regurgitation moderate. Left atrium mild to moderately dilated.  She was initially started on heparin  drip, and transition to Eliquis  which she tolerated.  TTE was completed that showed D-shaped septum suggesting RV overload.  Her PE/DVT is likely provoked in the setting of her long travel.  She was prescribed Eliquis  5 mg daily upon discharge.     She has an appointment with with pulmonology, rheumatology and oncology the week of October 09, 2024. She said that she still has some shortness of breath but only on exertion.     She has a history of Pulmonary Sarcoidosis followed by Dr. Tawni Never at [facility].  In the past, Overnight oximetry has showen SpO2 dipping below 89%. History of mild pulmonary hypertension. Was diagnosed in 2019 after having a renal stone and CT suggested lymphadenopathy. Had PET scan which was abnormal and subsequently diagnosed with sarcoidosis.     Has also been diagnosed with sleep apnea.   History of GE Reflux treated with Famotidine  20 mg daily.     History of Hyperlipidemia treated with Rosuvastatin  5 mg daily. 08/23/2024 Lipid panel CHOL 204, LDL 111, Otherwise WNL.     History of Insomnia treated  with Lorazepam  1 mg daily.    History of Dependent edema treated with furosemide  20 mg daily. She said that it has not been taking it frequently. Recommenced that she take it at least three times a week. She has been having some lower extremity edema.    History of hypertension treated with losartan  25 mg  daily. Blood pressure normal today at .    Diagnosed with seronegative Rheumatoid arthritis by Dr. Dolphus on 08/04/23. Treated with Methotrexate  0.8 mg injected weekly and Etanercept  1 mL injections weekly.     Seen in [facility] ED on 06/03/23 for worsening dyspnea and swelling of the hands and legs. Chest X-ray showed no acute abnormality. BNP slightly elevated at 264. Discharged with prednisone  5 mg for thirty days, furosemide  20 mg daily for seven days.    Past Medical History:  Diagnosis Date   Arthritis 2024   Cancer [facility]) 2011   ovarian   Cataract 2024   Complication of anesthesia    GERD (gastroesophageal reflux disease)    Hearing aid worn    both ears   History of chemotherapy 2011   History of hiatal hernia    History of kidney stones    Hypertension    MGUS (monoclonal gammopathy of unknown significance)    dx by hematology per patient   Neuromuscular disorder (HCC)    left foot numbness since  eback surgery   Osteoporosis    PONV (postoperative nausea and vomiting)    Presence of permanent cardiac pacemaker    Sarcoidosis    in remission sees pumonary  once a year dr bellinger lov 08-21-2020 [facility]   Sick sinus syndrome [facility])    Sleep apnea 2024   Wears glasses      Family History  Problem Relation Age of Onset   Hypertension Mother    Hearing loss Mother    Prostate cancer Father 93   Cancer Father    Hypertension Father    Stroke Father    Colon polyps Sister    Colon cancer Maternal Grandmother        dx in her 75s   Diabetes Maternal Grandmother    Heart disease Maternal Grandfather    Hearing loss Maternal Grandfather    Colon cancer Paternal Grandmother    Heart disease Paternal Grandfather    Breast cancer Paternal Aunt 26   Colon cancer Paternal Aunt        dx in her 53s   Melanoma Cousin 54   Breast cancer Cousin 68       multiple cousins   Colon polyps Cousin    Breast cancer Cousin 30   Breast cancer Cousin        father's  maternal 1st cousin   Breast cancer Cousin        Father's first cousin's daughter   Cancer Other        maternal great uncle with Cancer - NOS   Cancer Other        maternal great grandmother with either colon or stomach cancer   Breast cancer Other        fathers maternal aunt  Family History Narrative: Father died at age 84 with prostate cancer.  Brother died at age 76 of an AVM rupture.  1 sister in good health.   Social history: Divorced. Has 2 adult daughters. Does not smoke but did smoke from the ages of 45 through 36. Social alcohol consumption.  She is retired. 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  Resides alone. Her sister is driving her to ED      Review of Systems  Respiratory:  Positive for shortness of breath.   Cardiovascular:  Positive for leg swelling.        Objective:   Vitals: BP (!) 150/90   Pulse 68   Ht 5' 4.5 (1.638 m)   Wt 185 lb (83.9 kg)   SpO2 91%   BMI 31.26 kg/m    Physical Exam Vitals and nursing note reviewed.  Constitutional:      General: She is not in acute distress.    Appearance: Normal appearance. She is not toxic-appearing.  HENT:     Head: Normocephalic and atraumatic.  Neck:     Vascular: No carotid bruit.  Cardiovascular:     Rate and Rhythm: Normal rate and regular rhythm. No extrasystoles are present.    Pulses: Normal pulses.     Heart sounds: Normal heart sounds. No murmur heard.    No friction rub. No gallop.  Pulmonary:     Effort: Pulmonary effort is normal. No respiratory distress.     Breath sounds: Normal breath sounds. No wheezing or rales.  Musculoskeletal:     Right foot: Swelling present.     Left foot: Swelling present.     Comments: Trace to 1+ pitting edema of the ankles.   Skin:    General: Skin is warm and dry.  Neurological:     Mental Status: She is alert and oriented to person, place, and time. Mental status is at baseline.  Psychiatric:        Mood and Affect: Mood  normal.        Behavior: Behavior normal.        Thought Content: Thought content normal.        Judgment: Judgment normal.       Results:    Labs:       Component Value Date/Time   NA 138 09/20/2024 0357   NA 144 10/28/2020 1636   K 3.2 (L) 09/20/2024 0357   CL 105 09/20/2024 0357   CO2 25 09/20/2024 0357   GLUCOSE 95 09/20/2024 0357   BUN 20 09/20/2024 0357   BUN 16 10/28/2020 1636   CREATININE 0.71 09/20/2024 0357   CREATININE 0.77 08/22/2024 0953   CALCIUM  8.2 (L) 09/20/2024 0357   PROT 5.9 (L) 09/18/2024 1802   ALBUMIN 3.1 (L) 09/18/2024 1802   AST 35 09/18/2024 1802   AST 19 07/18/2024 1504   ALT 18 09/18/2024 1802   ALT 7 07/18/2024 1504   ALKPHOS 58 09/18/2024 1802   BILITOT 1.3 (H) 09/18/2024 1802   BILITOT 0.3 07/18/2024 1504   GFRNONAA >60 09/20/2024 0357   GFRNONAA >60 07/18/2024 1504   GFRNONAA 82 07/11/2020 0949   GFRAA 93 10/28/2020 1636   GFRAA 95 07/11/2020 0949     Lab Results  Component Value Date   WBC 12.1 (H) 09/20/2024   HGB 10.8 (L) 09/20/2024   HCT 35.1 (L) 09/20/2024   MCV 92.1 09/20/2024   PLT 212 09/20/2024    Lab Results  Component Value Date   CHOL 204 (H) 08/22/2024   HDL 73 08/22/2024   LDLCALC 111 (H) 08/22/2024   TRIG 101 08/22/2024   CHOLHDL 2.8 08/22/2024    Lab Results  Component Value Date   HGBA1C 5.2 08/22/2024     Lab Results  Component Value Date   TSH 3.02 08/22/2024  Assessment & Plan:   Orders Placed This Encounter  Procedures   Comprehensive metabolic panel with GFR   Ambulatory referral to Cardiology    Referral Priority:   Urgent    Referral Type:   Consultation    Referral Reason:   Specialty Services Required    Number of Visits Requested:   1    Acute Pulmonary Embolism: On December 12 she presented to [facility] center for shortness of breath. She had been experiencing shortness of breath for approximately 2 weeks, which she attributes to a severe cold that  began in 07/2024.She had a long road trip in September 2025 when she traveled for about 500 miles. No long road trips since then.  The onset of her symptoms was gradual, and she has not previously experienced such breathlessness. At Urgent Care, Pulse  was 102, respiratory rate  was 24  and pulse ox 96% on room air. Pulse ox  was 91%  with walking.Chest x-ray reviewed: No pneumonia, pneumothorax, or pleural effusion. Stable dual chamber pacer, scattered scar in lung apices. No changes noted. Decadron  10 mg IM administered. Duo-Neb nebulization was administered, with noted improvement in breathing and airways.Twelve-lead EKG completed: Normal sinus rhythm, no arrhythmias or evidence of ischemia.  She was prescribed Breo  Ellipta once daily, albuterol  inhaler every 4-6 hours as needed for shortness of breath, doxycycline  100 mg twice daily for 7 days. She was diagnosed with acute sinusitis and reactive airways.   On December 15 she presented to this office still short of breath. Walking around the office she desaturated to 71%. She was shortly sent to the hospital after her visit.  At the hospital, CTA showed pulmonary emboli within segmental branches of the bilateral upper lobes, as well as within segmental and subsegmental branches, new subsolid pulmonary nodules.  Venous duplex showed DVT in the right popliteal vein.  Labs also showed elevated troponin which is likely due to the demand ischemia from her PE. Troponin was 290 and 311 on 09/18/2024 and 363 on 09/19/2024. 09/18/2024 ECG Left ejection fraction 60-65%. moderate concentric LVH. Evidence of right ventricular volume overload. Mitral valve normal. Tricuspid valve regurgitation moderate. Left atrium mild to moderately dilated.  She was initially started on heparin  drip, and transition to Eliquis  which she tolerated.  TTE was completed that showed D-shaped septum suggesting RV overload.  Her PE/DVT is likely provoked in the setting of her long travel.  She  was prescribed Eliquis  5 mg daily upon discharge.   Pulmonary will decide how long she needs anticoagulation.   She has an appointment with with Pulmonology, Rheumatology and Oncology the week of October 09, 2024. She said that she still has some shortness of breath but only on exertion.     Pulmonary sarcoidosis: followed by Dr. Tawni Never at [facility]. Overnight oximetry showed SpO2 dipping below 89% History of mild pulmonary hypertension. Was diagnosed in 2019 after having a renal stone and CT suggested lymphadenopathy. Had PET scan which was abnormal and subsequently diagnosed with sarcoidosis.     Has also been diagnosed with sleep apnea.   GE Reflux: treated with Famotidine  20 mg daily.     Hyperlipidemia treated with Rosuvastatin  5 mg daily. 08/23/2024 Lipid panel CHOL 204, LDL 111, Otherwise WNL.     Insomnia: treated with Lorazepam  1 mg daily.    Dependent edema: treated with furosemide  20 mg daily. She said that it has not been taking it frequently. Recommenced that  she take it at least three times a week. She has been having some lower extremity edema.  Referred to Cardiology.  CMP collected today.     Hypertension: treated with losartan  25 mg daily. Blood pressure normal today at .    Diagnosed with seronegative Rheumatoid arthritis by Dr. Dolphus on 08/04/23. Treated with Methotrexate  0.8 mg injected weekly and Etanercept  1 mL injections weekly.     Seen in [facility] ED on 06/03/23 for worsening dyspnea and swelling of the hands and legs. Chest X-ray showed no acute abnormality. BNP slightly elevated at 264. Discharged with prednisone  5 mg for thirty days, furosemide  20 mg daily for seven days.      I,Makayla C Reid,acting as a scribe for Ronal JINNY Hailstone, MD.,have documented all relevant documentation on the behalf of Ronal JINNY Hailstone, MD,as directed by  Ronal JINNY Hailstone, MD while in the presence of Ronal JINNY Hailstone, MD.   I, Ronal JINNY Hailstone, MD, have  reviewed all documentation for this visit. The documentation on 09/25/2024 for the exam, diagnosis, procedures, and orders are all accurate and complete.   The patient was in the office for this visit one hour and 15 minutes. Time was spent with chart review including labs and hospital discharge summary, interviewing patient, obtaining a C-met lab, and interpreting C-met results and conveying result to patient   as well as medical decision making. This is a  very high complexity patient.   "

## 2024-09-26 ENCOUNTER — Ambulatory Visit: Payer: Self-pay | Admitting: Internal Medicine

## 2024-09-26 ENCOUNTER — Other Ambulatory Visit (HOSPITAL_COMMUNITY): Payer: Self-pay

## 2024-09-28 NOTE — Progress Notes (Unsigned)
 "  Office Visit Note  Patient: Julia Solis             Date of Birth: [birth year]           MRN: [id number]             PCP: Perri Ronal PARAS, MD Referring: Perri Ronal PARAS, MD Visit Date: 10/12/2024 Occupation: Data Unavailable  Subjective:  No chief complaint on file.   History of Present Illness: Julia Solis is a 73 y.o. female ***   Fever of unknown origin; Nightly-as high as 103.5 per patient. Associated symptoms: Body aches and sore throat.  No fever during the daily time.  Persistently elevated inflammatory markers.  ENT has rule out infection. Chest x-ray did not reveal any acute abnormalities on 07/05/2024. RA is improving on current treatment regimen.  Not explained by insufficient adrenal replacement therapy per Dr. Faythe.  Plan [facility] for [facility] opinion.    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  She remains under the care of pulmonology yearly.  Chest x-ray December 2024 showed no active disease. CXR ordered by Dr. Faythe on 07/05/24--no acute abnormalities.  Chronic mild scarring in the bilateral upper lobes noted. Plan refer the patient to [facility] for an urgent referral as a second opinion.    Dry eye syndrome of both eyes: She is using systane eye drops for dry eyes.    Age-related osteoporosis without current pathological fracture - DEXA 01/08/2024:The BMD measured at [facility] L1-L4 is 0.834 g/cm2 with a T-score of -2.9.  She has been taking Fosamax  70 mg by mouth. weekly since April 2025.      Activities of Daily Living:  Patient reports morning stiffness for *** {minute/hour:19697}.   Patient {ACTIONS;DENIES/REPORTS:21021675::Denies} nocturnal pain.  Difficulty dressing/grooming: {ACTIONS;DENIES/REPORTS:21021675::Denies} Difficulty climbing stairs: {ACTIONS;DENIES/REPORTS:21021675::Denies} Difficulty getting out of chair: {ACTIONS;DENIES/REPORTS:21021675::Denies} Difficulty using hands for  taps, buttons, cutlery, and/or writing: {ACTIONS;DENIES/REPORTS:21021675::Denies}  No Rheumatology ROS completed.   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/23/2023   Monoclonal gammopathy of unknown significance 08/03/2023   Primary osteoarthritis involving multiple joints 08/03/2023   Edema 06/26/2023   Hand arthritis 04/27/2023   Symptomatic sinus bradycardia 03/20/2022   Pacemaker 03/20/2022   Sarcoidosis 10/31/2018   Dry eye syndrome 10/16/2015   NS (nuclear sclerosis) 10/16/2015   Dystrophy, 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   Dry eye syndrome of both lacrimal glands 10/16/2015   Elevated LDL cholesterol level 02/26/2015   Back pain 02/20/2014   Insomnia 02/20/2014   Allergic rhinitis 02/20/2014   Osteopenia 07/07/2012   Fatigue 08/17/2011   Nonspecific (abnormal) findings on radiological and other examination of body structure 06/24/2010   ABNORMAL LUNG XRAY 06/24/2010   History of ovarian cancer 06/23/2010   Hypertension 06/23/2010    Past Medical History:  Diagnosis Date   Arthritis 2024   Cancer [facility]) 2011   ovarian   Cataract 2024   Complication of anesthesia    GERD (gastroesophageal reflux disease)    Hearing aid worn    both ears   History of chemotherapy 2011  History of hiatal hernia    History of kidney stones    Hypertension    MGUS (monoclonal gammopathy of unknown significance)    dx by hematology per patient   Neuromuscular disorder (HCC)    left foot numbness since  eback surgery   Osteoporosis    PONV (postoperative nausea and  vomiting)    Presence of permanent cardiac pacemaker    Sarcoidosis    in remission sees pumonary  once a year dr bellinger lov 08-21-2020 care everywhere   Sick sinus syndrome [facility])    Sleep apnea 2024   Wears glasses     Family History  Problem Relation Age of Onset   Hypertension Mother    Hearing loss Mother    Prostate cancer Father 69   Cancer Father    Hypertension Father    Stroke Father    Colon polyps Sister    Colon cancer Maternal Grandmother        dx in her 53s   Diabetes Maternal Grandmother    Heart disease Maternal Grandfather    Hearing loss Maternal Grandfather    Colon cancer Paternal Grandmother    Heart disease Paternal Grandfather    Breast cancer Paternal Aunt 88   Colon cancer Paternal Aunt        dx in her 33s   Melanoma Cousin 61   Breast cancer Cousin 75       multiple cousins   Colon polyps Cousin    Breast cancer Cousin 71   Breast cancer Cousin        father's maternal 1st cousin   Breast cancer Cousin        Father's first cousin's daughter   Cancer Other        maternal great uncle with Cancer - NOS   Cancer Other        maternal great grandmother with either colon or stomach cancer   Breast cancer Other        fathers maternal aunt   Past Surgical History:  Procedure Laterality Date   ABDOMINAL HYSTERECTOMY  2011   APPENDECTOMY  2011    ?with hysterectomy   BACK SURGERY  1997   lumbar   CARPAL TUNNEL RELEASE Right    CATARACT EXTRACTION Right 08/10/2023   CATARACT EXTRACTION Left 08/24/2023   CESAREAN SECTION  1982, 1985   COSMETIC SURGERY  2007   brow lift   CYSTOSCOPY WITH RETROGRADE PYELOGRAM, URETEROSCOPY AND STENT PLACEMENT Left 02/04/2021   Procedure: CYSTOSCOPY WITH RETROGRADE PYELOGRAM, URETEROSCOPY AND STENT PLACEMENT;  Surgeon: Rosalind Zachary NOVAK, MD;  Location: [facility];  Service: Urology;  Laterality: Left;  1 HR; nephrolithiasis   ECTOPIC PREGNANCY SURGERY  1980   EXTRACORPOREAL SHOCK WAVE  LITHOTRIPSY Left 06/23/2018   Procedure: LEFT EXTRACORPOREAL SHOCK WAVE LITHOTRIPSY (ESWL);  Surgeon: Sherrilee Belvie CROME, MD;  Location: [facility];  Service: Urology;  Laterality: Left;   GANGLION CYST EXCISION  1975   HOLMIUM LASER APPLICATION Left 02/04/2021   Procedure: HOLMIUM LASER APPLICATION;  Surgeon: Rosalind Zachary NOVAK, MD;  Location: [facility];  Service: Urology;  Laterality: Left;   PACEMAKER IMPLANT N/A 11/13/2020   Procedure: PACEMAKER IMPLANT;  Surgeon: Kelsie Agent, MD;  Location: [facility];  Service: Cardiovascular;  Laterality: N/A;   SPINE SURGERY  1997   TONSILLECTOMY  1957   adenoids removed   Social History[1] Social History   Social History Narrative   Not on file  Immunization History  Administered Date(s) Administered   Fluad Quad(high Dose 65+) 06/22/2019, 06/15/2022   Fluad Trivalent(High Dose 65+) 07/06/2023   INFLUENZA, HIGH DOSE SEASONAL PF 06/22/2019, 07/01/2020, 06/17/2021, 12/16/2021, 07/17/2024   Influenza Nasal 07/06/2023   Influenza Split 07/07/2011, 07/07/2012, 07/04/2013   Influenza,inj,Quad PF,6+ Mos 07/09/2018   Influenza-Unspecified 07/05/2014, 06/26/2015, 07/05/2016, 07/29/2017   Moderna Covid-19 Fall Seasonal Vaccine 27yrs & older 06/23/2022, 07/06/2023   Moderna SARS-COV2 Booster Vaccination 07/17/2024   Moderna Sars-Covid-2 Vaccination 11/07/2019, 12/07/2019, 07/29/2020, 07/06/2023   PFIZER Comirnaty(Gray Top)Covid-19 Tri-Sucrose Vaccine 01/11/2021   Pfizer Covid-19 Vaccine Bivalent Booster 45yrs & up 06/17/2021, 01/27/2022   Pneumococcal Conjugate-13 02/26/2015, 04/19/2017   Pneumococcal Polysaccharide-23 10/05/2009, 07/11/2018, 12/16/2021   Respiratory Syncytial Virus Vaccine,Recomb Aduvanted(Arexvy) 07/16/2022   Tdap 06/05/2009, 03/19/2020   Unspecified SARS-COV-2 Vaccination 06/23/2022   Zoster Recombinant(Shingrix) 07/20/2023   Zoster, Live 10/05/2009     Objective: Vital Signs: There were no vitals  taken for this visit.   Physical Exam   Musculoskeletal Exam: ***  CDAI Exam: CDAI Score: -- Patient Global: --; Provider Global: -- Swollen: --; Tender: -- Joint Exam 10/12/2024   No joint exam has been documented for this visit   There is currently no information documented on the homunculus. Go to the Rheumatology activity and complete the homunculus joint exam.  Investigation: No additional findings.  Imaging: VAS US  LOWER EXTREMITY VENOUS (DVT) Result Date: 09/19/2024  Lower Venous DVT Study Patient Name:  Ishanvi ANNE [facility]  Date of Exam:   09/19/2024 Medical Rec #: [id number]          Accession #:    [id number] Date of Birth: Jul 30, 1951          Patient Gender: F Patient Age:   26 years Exam Location:  [facility] Procedure:      VAS US  LOWER EXTREMITY VENOUS (DVT) Referring Phys: TIMOTHY OPYD --------------------------------------------------------------------------------  Indications: Pulmonary embolism.  Risk Factors: Confirmed PE. Anticoagulation: Heparin . Comparison Study: No prior studies. Performing Technologist: Cordella Collet RVT  Examination Guidelines: A complete evaluation includes B-mode imaging, spectral Doppler, color Doppler, and power Doppler as needed of all accessible portions of each vessel. Bilateral testing is considered an integral part of a complete examination. Limited examinations for reoccurring indications may be performed as noted. The reflux portion of the exam is performed with the patient in reverse Trendelenburg.  +---------+---------------+---------+-----------+----------+--------------+ RIGHT    CompressibilityPhasicitySpontaneityPropertiesThrombus Aging +---------+---------------+---------+-----------+----------+--------------+ CFV      Full           Yes      Yes                                 +---------+---------------+---------+-----------+----------+--------------+ SFJ      Full                                                         +---------+---------------+---------+-----------+----------+--------------+ FV Prox  Full                                                        +---------+---------------+---------+-----------+----------+--------------+ FV Mid   Full                                                        +---------+---------------+---------+-----------+----------+--------------+  FV DistalFull                                                        +---------+---------------+---------+-----------+----------+--------------+ PFV      Full                                                        +---------+---------------+---------+-----------+----------+--------------+ POP      Partial        Yes      Yes                  Acute          +---------+---------------+---------+-----------+----------+--------------+ PTV      Full                                                        +---------+---------------+---------+-----------+----------+--------------+ PERO     Full                                                        +---------+---------------+---------+-----------+----------+--------------+ Gastroc  Partial                                      Acute          +---------+---------------+---------+-----------+----------+--------------+   +---------+---------------+---------+-----------+----------+--------------+ LEFT     CompressibilityPhasicitySpontaneityPropertiesThrombus Aging +---------+---------------+---------+-----------+----------+--------------+ CFV      Full           Yes      Yes                                 +---------+---------------+---------+-----------+----------+--------------+ SFJ      Full                                                        +---------+---------------+---------+-----------+----------+--------------+ FV Prox  Full                                                         +---------+---------------+---------+-----------+----------+--------------+ FV Mid   Full                                                        +---------+---------------+---------+-----------+----------+--------------+ FV  DistalFull                                                        +---------+---------------+---------+-----------+----------+--------------+ PFV      Full                                                        +---------+---------------+---------+-----------+----------+--------------+ POP      Full           Yes      Yes                                 +---------+---------------+---------+-----------+----------+--------------+ PTV      Full                                                        +---------+---------------+---------+-----------+----------+--------------+ PERO     Full                                                        +---------+---------------+---------+-----------+----------+--------------+     Summary: RIGHT: - Findings consistent with acute deep vein thrombosis involving the right popliteal vein. Findings consistent with acute intramuscular thrombosis involving the right gastrocnemius veins. - No cystic structure found in the popliteal fossa.  LEFT: - There is no evidence of deep vein thrombosis in the lower extremity.  - No cystic structure found in the popliteal fossa.  *See table(s) above for measurements and observations. Electronically signed by Lonni Gaskins MD on 09/19/2024 at 4:21:46 PM.    Final    ECHOCARDIOGRAM COMPLETE Result Date: 09/19/2024    ECHOCARDIOGRAM REPORT   Patient Name:   Forever ANNE [facility] Date of Exam: 09/19/2024 Medical Rec #:  [id number]         Height:       64.5 in Accession #:    [id number]        Weight:       180.8 lb Date of Birth:  [birth year]         BSA:          1.884 m Patient Age:    73 years          BP:           157/82 mmHg Patient Gender: F                 HR:           83 bpm. Exam  Location:  Inpatient Procedure: 2D Echo, Cardiac Doppler and Color Doppler (Both Spectral and Color            Flow Doppler were utilized during procedure). Indications:    Pulmonary embolus 126.09; elevated troponin  History:        Patient has prior  history of Echocardiogram examinations, most                 recent 06/09/2023. Pacemaker; Risk Factors:Hypertension, Sleep                 Apnea and Former Smoker.  Sonographer:    Merlynn Argyle Referring Phys: [id number] TIMOTHY S OPYD IMPRESSIONS  1. Left ventricular ejection fraction, by estimation, is 60 to 65%. The left ventricle has normal function. The left ventricle has no regional wall motion abnormalities. There is moderate concentric left ventricular hypertrophy. Left ventricular diastolic parameters are consistent with Grade I diastolic dysfunction (impaired relaxation). There is the interventricular septum is flattened in diastole ('D' shaped left ventricle), consistent with right ventricular volume overload.  2. Right ventricular systolic function is normal. The right ventricular size is moderately enlarged. There is moderately elevated pulmonary artery systolic pressure. The estimated right ventricular systolic pressure is 48.2 mmHg.  3. Left atrial size was mild to moderately dilated.  4. Right atrial size was moderately dilated.  5. The mitral valve is normal in structure. No evidence of mitral valve regurgitation. No evidence of mitral stenosis.  6. Tricuspid valve regurgitation is moderate.  7. The aortic valve is tricuspid. Aortic valve regurgitation is not visualized. Aortic valve sclerosis/calcification is present, without any evidence of aortic stenosis.  8. The inferior vena cava is normal in size with greater than 50% respiratory variability, suggesting right atrial pressure of 3 mmHg. Comparison(s): Prior images reviewed side by side. Compared to the previous study the right ventricle is dilated and the systolic pulmonary artery pressure is higher,  but right ventricular systolic function remains preserved. FINDINGS  Left Ventricle: Left ventricular ejection fraction, by estimation, is 60 to 65%. The left ventricle has normal function. The left ventricle has no regional wall motion abnormalities. The left ventricular internal cavity size was normal in size. There is  moderate concentric left ventricular hypertrophy. The interventricular septum is flattened in diastole ('D' shaped left ventricle), consistent with right ventricular volume overload. Left ventricular diastolic parameters are consistent with Grade I diastolic dysfunction (impaired relaxation). Normal left ventricular filling pressure. Right Ventricle: The right ventricular size is moderately enlarged. No increase in right ventricular wall thickness. Right ventricular systolic function is normal. There is moderately elevated pulmonary artery systolic pressure. The tricuspid regurgitant  velocity is 3.36 m/s, and with an assumed right atrial pressure of 3 mmHg, the estimated right ventricular systolic pressure is 48.2 mmHg. Left Atrium: Left atrial size was mild to moderately dilated. Right [facility]: Right atrial size was moderately dilated. Pericardium: Trivial pericardial effusion is present. Mitral Valve: The mitral valve is normal in structure. No evidence of mitral valve regurgitation. No evidence of mitral valve stenosis. Tricuspid Valve: The tricuspid valve is normal in structure. Tricuspid valve regurgitation is moderate. Aortic Valve: The aortic valve is tricuspid. Aortic valve regurgitation is not visualized. Aortic valve sclerosis/calcification is present, without any evidence of aortic stenosis. Pulmonic Valve: The pulmonic valve was normal in structure. Pulmonic valve regurgitation is trivial. No evidence of pulmonic stenosis. Aorta: The aortic root is normal in size and structure. Venous: The inferior vena cava is normal in size with greater than 50% respiratory variability, suggesting  right atrial pressure of 3 mmHg. IAS/Shunts: No atrial level shunt detected by color flow Doppler. Additional Comments: A device lead is visualized in the right atrium and right ventricle.  LEFT VENTRICLE PLAX 2D LVIDd:         4.20 cm  Diastology LVIDs:         2.80 cm   LV e' medial:    5.13 cm/s LV PW:         1.40 cm   LV E/e' medial:  9.5 LV IVS:        1.20 cm   LV e' lateral:   8.08 cm/s LVOT diam:     1.90 cm   LV E/e' lateral: 6.0 LV SV:         66 LV SV Index:   35 LVOT Area:     2.84 cm LV IVRT:       85 msec  RIGHT VENTRICLE             IVC RV Basal diam:  4.40 cm     IVC diam: 1.70 cm RV Mid diam:    4.10 cm RV S prime:     12.70 cm/s TAPSE (M-mode): 1.7 cm LEFT ATRIUM           Index        RIGHT ATRIUM           Index LA diam:      4.80 cm 2.55 cm/m   RA Area:     28.58 cm 15.17 cm/m LA Vol (A2C): 54.4 ml 28.87 ml/m  RA Volume:   75.18 ml  39.90 ml/m LA Vol (A4C): 45.6 ml 24.22 ml/m  AORTIC VALVE             PULMONIC VALVE LVOT Vmax:   143.00 cm/s PR End Diast Vel: 8.88 msec LVOT Vmean:  81.700 cm/s LVOT VTI:    0.232 m  AORTA Ao Root diam: 3.20 cm Ao Asc diam:  2.90 cm MITRAL VALVE               TRICUSPID VALVE MV Area (PHT): 2.29 cm    TR Peak grad:   45.2 mmHg MV Decel Time: 331 msec    TR Vmax:        336.00 cm/s MV E velocity: 48.70 cm/s MV A velocity: 92.40 cm/s  SHUNTS MV E/A ratio:  0.53        Systemic VTI:  0.23 m                            Systemic Diam: 1.90 cm Mihai Croitoru MD Electronically signed by Jerel Balding MD Signature Date/Time: 09/19/2024/4:05:16 PM    Final    CT Angio Chest PE W and/or Wo Contrast Result Date: 09/18/2024 EXAM: CTA of the Chest with contrast for PE 09/18/2024 08:46:26 PM TECHNIQUE: CTA of the chest was performed without and with the administration of 75 mL of intravenous iohexol  (OMNIPAQUE ) 350 MG/ML injection. Multiplanar reformatted images are provided for review. MIP images are provided for review. Automated exposure control, iterative  reconstruction, and/or weight based adjustment of the mA/kV was utilized to reduce the radiation dose to as low as reasonably achievable. COMPARISON: CT chest 06/02/2011. CLINICAL HISTORY: Pulmonary embolism (PE) suspected, high prob. FINDINGS: PULMONARY ARTERIES: Pulmonary arteries are adequately opacified for evaluation. Pulmonary emboli are seen within segmental branches of the bilateral upper lobes as well as within segmental and subsegmental branches of the bilateral lower lobes and right middle lobe. Main pulmonary artery is enlarged. MEDIASTINUM: The heart is moderately enlarged, a new finding. Left sided pacemaker is present. There is no acute abnormality of the thoracic aorta. The thyroid  gland is heterogeneous diffusely. Isthmus nodule measures 17  mm, new from prior. There is a 3.3 x 1.7 cm low density nodule in the right paratracheal region which has increased in size. LYMPH NODES: There are no new enlarged hilar or axillary lymph nodes identified. LUNGS AND PLEURA: Chronic appearing pleural parenchymal opacities with bronchiectasis in the bilateral upper lobes superior unchanged from 2012. There is a new subsolid nodular density in the right lower lobe measuring 11 mm (image 6/99). Ground glass slightly nodular area measures 12 mm in the right lower lobe (image 6/104) which is new from prior. There is no pleural effusion or pneumothorax. UPPER ABDOMEN: There is a small hiatal hernia. There is a cyst in the left lobe of the liver measuring 17 mm. SOFT TISSUES AND BONES: No acute bone or soft tissue abnormality. IMPRESSION: 1. Pulmonary emboli within segmental branches of the bilateral upper lobes as well as within segmental and subsegmental branches of the bilateral lower lobes and right middle lobe. 2. New subsolid pulmonary nodules in the right lower lobe measuring 11 mm and 12 mm; recommend CT at 3-6 months to confirm persistence, and if unchanged with solid component 5 mm, then annual CT for 5  years, as per Fleischner Society Guidelines. 3. Moderate cardiomegaly. 4. Enlarged main pulmonary artery. 5. Enlarging right paratracheal lymph node. 6. Stable chronic parenchymal opacities in the bilateral upper lobes. Electronically signed by: Greig Pique MD 09/18/2024 09:12 PM EST RP Workstation: [id number]    Recent Labs: Lab Results  Component Value Date   WBC 12.1 (H) 09/20/2024   HGB 10.8 (L) 09/20/2024   PLT 212 09/20/2024   NA 143 09/25/2024   K 4.6 09/25/2024   CL 107 09/25/2024   CO2 30 09/25/2024   GLUCOSE 97 09/25/2024   BUN 16 09/25/2024   CREATININE 0.77 09/25/2024   BILITOT 0.5 09/25/2024   ALKPHOS 58 09/18/2024   AST 17 09/25/2024   ALT 11 09/25/2024   PROT 6.0 (L) 09/25/2024   ALBUMIN 3.1 (L) 09/18/2024   CALCIUM  9.1 09/25/2024   GFRAA 93 10/28/2020   QFTBGOLDPLUS NEGATIVE 06/29/2024    Speciality Comments: PLQ EYE EXAM 09/22/2023 normal [facility] f/u 12 months.  Procedures:  No procedures performed Allergies: Augmentin  [amoxicillin -pot clavulanate], Latex, Tramadol , Ciprofloxacin, and Flomax [tamsulosin hcl]   Assessment / Plan:     Visit Diagnoses: Seronegative rheumatoid arthritis (HCC)  High risk medication use  Positive ANA (antinuclear antibody)  Current chronic use of systemic steroids  Sarcoidosis  Dry eye syndrome of both eyes  Age-related osteoporosis without current pathological fracture  Other fatigue  Other eczema  Malignant neoplasm of ovary, unspecified laterality (HCC)  Pacemaker  MGUS (monoclonal gammopathy of unknown significance)  Symptomatic sinus bradycardia  Obstructive sleep apnea  Pulmonary hypertension (HCC)  Salzmann's nodular dystrophy of both eyes  Iatrogenic Cushing's syndrome  Orders: No orders of the defined types were placed in this encounter.  No orders of the defined types were placed in this encounter.   Face-to-face time spent with patient was *** minutes. Greater than  50% of time was spent in counseling and coordination of care.  Follow-Up Instructions: No follow-ups on file.   Waddell CHRISTELLA Craze, PA-C  Note - This record has been created using Dragon software.  Chart creation errors have been sought, but may not always  have been located. Such creation errors do not reflect on  the standard of medical care.     [1]  Social History Tobacco Use   Smoking status: Former    Current  packs/day: 0.00    Average packs/day: 1.5 packs/day for 11.0 years (16.5 ttl pk-yrs)    Types: Cigarettes    Start date: 10/05/1965    Quit date: 10/05/1976    Years since quitting: 48.0    Passive exposure: Past   Smokeless tobacco: Never  Vaping Use   Vaping status: Never Used  Substance Use Topics   Alcohol use: Not Currently   Drug use: No   "

## 2024-09-30 ENCOUNTER — Encounter: Payer: Self-pay | Admitting: Internal Medicine

## 2024-09-30 NOTE — Patient Instructions (Addendum)
 She has multiple appt with Pulmonary, Cardiology, Rheumatology and Oncology in the near future. I will continue close follow up as needed. Annual wellness visit is due in November.

## 2024-10-03 ENCOUNTER — Ambulatory Visit: Admitting: Internal Medicine

## 2024-10-09 ENCOUNTER — Inpatient Hospital Stay: Attending: Oncology

## 2024-10-09 ENCOUNTER — Ambulatory Visit: Attending: Internal Medicine | Admitting: Internal Medicine

## 2024-10-09 ENCOUNTER — Encounter: Payer: Self-pay | Admitting: Oncology

## 2024-10-09 ENCOUNTER — Inpatient Hospital Stay: Admitting: Oncology

## 2024-10-09 VITALS — BP 158/73 | HR 80 | Temp 98.5°F | Resp 18 | Ht 64.5 in | Wt 181.9 lb

## 2024-10-09 VITALS — BP 125/62 | HR 70 | Ht 64.0 in | Wt 181.0 lb

## 2024-10-09 DIAGNOSIS — I2489 Other forms of acute ischemic heart disease: Secondary | ICD-10-CM

## 2024-10-09 DIAGNOSIS — I2699 Other pulmonary embolism without acute cor pulmonale: Secondary | ICD-10-CM

## 2024-10-09 DIAGNOSIS — D869 Sarcoidosis, unspecified: Secondary | ICD-10-CM

## 2024-10-09 DIAGNOSIS — I2609 Other pulmonary embolism with acute cor pulmonale: Secondary | ICD-10-CM

## 2024-10-09 DIAGNOSIS — D472 Monoclonal gammopathy: Secondary | ICD-10-CM

## 2024-10-09 DIAGNOSIS — Z950 Presence of cardiac pacemaker: Secondary | ICD-10-CM

## 2024-10-09 DIAGNOSIS — Z8543 Personal history of malignant neoplasm of ovary: Secondary | ICD-10-CM

## 2024-10-09 DIAGNOSIS — I495 Sick sinus syndrome: Secondary | ICD-10-CM | POA: Diagnosis not present

## 2024-10-09 DIAGNOSIS — M0600 Rheumatoid arthritis without rheumatoid factor, unspecified site: Secondary | ICD-10-CM

## 2024-10-09 LAB — CMP (CANCER CENTER ONLY)
ALT: 9 U/L (ref 0–44)
AST: 19 U/L (ref 15–41)
Albumin: 3.8 g/dL (ref 3.5–5.0)
Alkaline Phosphatase: 78 U/L (ref 38–126)
Anion gap: 11 (ref 5–15)
BUN: 18 mg/dL (ref 8–23)
CO2: 30 mmol/L (ref 22–32)
Calcium: 9.1 mg/dL (ref 8.9–10.3)
Chloride: 101 mmol/L (ref 98–111)
Creatinine: 0.83 mg/dL (ref 0.44–1.00)
GFR, Estimated: >60 mL/min
Glucose, Bld: 108 mg/dL — ABNORMAL HIGH (ref 70–99)
Potassium: 3.2 mmol/L — ABNORMAL LOW (ref 3.5–5.1)
Sodium: 142 mmol/L (ref 135–145)
Total Bilirubin: 0.6 mg/dL (ref 0.0–1.2)
Total Protein: 6.8 g/dL (ref 6.5–8.1)

## 2024-10-09 LAB — CBC WITH DIFFERENTIAL (CANCER CENTER ONLY)
Abs Immature Granulocytes: 0.09 K/uL — ABNORMAL HIGH (ref 0.00–0.07)
Basophils Absolute: 0 K/uL (ref 0.0–0.1)
Basophils Relative: 0 %
Eosinophils Absolute: 0.2 K/uL (ref 0.0–0.5)
Eosinophils Relative: 1 %
HCT: 39.7 % (ref 36.0–46.0)
Hemoglobin: 12.5 g/dL (ref 12.0–15.0)
Immature Granulocytes: 1 %
Lymphocytes Relative: 5 %
Lymphs Abs: 0.9 K/uL (ref 0.7–4.0)
MCH: 29.1 pg (ref 26.0–34.0)
MCHC: 31.5 g/dL (ref 30.0–36.0)
MCV: 92.3 fL (ref 80.0–100.0)
Monocytes Absolute: 0.7 K/uL (ref 0.1–1.0)
Monocytes Relative: 4 %
Neutro Abs: 15.4 K/uL — ABNORMAL HIGH (ref 1.7–7.7)
Neutrophils Relative %: 89 %
Platelet Count: 249 K/uL (ref 150–400)
RBC: 4.3 MIL/uL (ref 3.87–5.11)
RDW: 19.1 % — ABNORMAL HIGH (ref 11.5–15.5)
WBC Count: 17.3 K/uL — ABNORMAL HIGH (ref 4.0–10.5)
nRBC: 0 % (ref 0.0–0.2)

## 2024-10-09 LAB — IRON AND TIBC
Iron: 26 ug/dL — ABNORMAL LOW (ref 28–170)
Saturation Ratios: 10 % — ABNORMAL LOW (ref 10.4–31.8)
TIBC: 272 ug/dL (ref 250–450)
UIBC: 246 ug/dL

## 2024-10-09 LAB — FERRITIN: Ferritin: 114 ng/mL (ref 11–307)

## 2024-10-09 LAB — LACTATE DEHYDROGENASE: LDH: 235 U/L (ref 105–235)

## 2024-10-09 LAB — D-DIMER, QUANTITATIVE: D-Dimer, Quant: 0.68 ug{FEU}/mL — ABNORMAL HIGH (ref 0.00–0.50)

## 2024-10-09 MED ORDER — APIXABAN 5 MG PO TABS: 5.0000 mg | ORAL_TABLET | Freq: Two times a day (BID) | ORAL | 5 refills | Status: AC

## 2024-10-09 NOTE — Assessment & Plan Note (Addendum)
 Faint IgG lambda detected on serum immunoelectrophoresis on routine blood work done at her rheumatologist office.   -Discussed diagnosis, implications, plan of care. 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    -Discussed small risk of progression to smoldering myeloma or actual multiple myeloma and hence continued monitoring is recommended.    -To complete workup, on her initial consultation with us  on 08/03/2023, we obtained serum free light chains, repeat SPEP and obtain 24-hour urine protein electrophoresis and immunoelectrophoresis.  SPEP showed no evidence of M spike.  IFE showed faint IgG band.  Serum free kappa and lambda were close to normal with normal ratio.  24-hour urine protein electrophoresis also came back unremarkable.  No CRAB features.   -Myeloma labs on 01/20/2024 showed no evidence of M spike on SPE. IFE showed IgG lambda monoclonal protein, faint amount.  Quantitative immunoglobulins were within normal limits.  Serum free kappa was 14.9 mg/L, serum free lambda 21.4 mg/L, ratio normal at 0.70.  Creatinine normal at 0.89, calcium  normal at 9.3.  Hemoglobin 12.8.  - Recent myeloma labs from 07/18/2024 showed small amount of M protein, too small to quantify.  IFE showed it to be IgG and lambda type.  Serum free kappa normal.  Serum free lambda slightly increased at 28.6 mg/L.  Ratio normal at 0.56.  Mild leukocytosis from prednisone  use noted.  Hemoglobin close to her baseline at 11.3.  Creatinine and calcium  are normal.  Repeat myeloma labs are pending from today.  -Clinical picture remains consistent with MGUS.  Patient was provided reassurance.  Will continue surveillance.

## 2024-10-09 NOTE — Progress Notes (Signed)
 "  [facility]  HEMATOLOGY CLINIC PROGRESS NOTE  PATIENT NAME: Julia Solis   MR#: [id number] DOB: 12/02/1950  Patient Care Team: Perri Ronal PARAS, MD as PCP - General (Internal Medicine) Mealor, Eulas BRAVO, MD as Consulting Physician (Cardiology) Dolphus Reiter, MD as Consulting Physician (Rheumatology) Mona Vinie BROCKS, MD as Consulting Physician (Cardiology) Cheryl Waddell CHRISTELLA DEVONNA as Physician Assistant (Physician Assistant) Alvenia Tawni Ee, MD as Referring Physician (Pulmonary Disease)  Date of visit: 10/09/2024   ASSESSMENT & PLAN:   Julia Solis is a 74 y.o. lady with a past medical history of sarcoidosis, pulmonary hypertension, sick sinus syndrome, hypertension, osteoporosis, renal stones, history of stage I ovarian cancer diagnosed in 2011, in remission, degenerative joint disease, was referred to our service in October 2024 for evaluation of monoclonal gammopathy.  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  Monoclonal gammopathy of unknown significance Faint IgG lambda detected on serum immunoelectrophoresis on routine blood work done at her rheumatologist office.   -Discussed diagnosis, implications, plan of care. 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    -Discussed small risk of progression to smoldering myeloma or actual multiple myeloma and hence continued monitoring is recommended.    -To complete workup, on her initial consultation with us  on 08/03/2023, we obtained serum free light chains, repeat SPEP and obtain 24-hour urine protein electrophoresis and immunoelectrophoresis.  SPEP showed no evidence of M spike.  IFE showed faint IgG band.  Serum free kappa and lambda were close to normal with normal ratio.  24-hour urine protein electrophoresis also came back unremarkable.  No CRAB  features.   -Myeloma labs on 01/20/2024 showed no evidence of M spike on SPE. IFE showed IgG lambda monoclonal protein, faint amount.  Quantitative immunoglobulins were within normal limits.  Serum free kappa was 14.9 mg/L, serum free lambda 21.4 mg/L, ratio normal at 0.70.  Creatinine normal at 0.89, calcium  normal at 9.3.  Hemoglobin 12.8.  - Recent myeloma labs from 07/18/2024 showed small amount of M protein, too small to quantify.  IFE showed it to be IgG and lambda type.  Serum free kappa normal.  Serum free lambda slightly increased at 28.6 mg/L.  Ratio normal at 0.56.  Mild leukocytosis from prednisone  use noted.  Hemoglobin close to her baseline at 11.3.  Creatinine and calcium  are normal.  Repeat myeloma labs are pending from today.  -Clinical picture remains consistent with MGUS.  Patient was provided reassurance.  Will continue surveillance.    History of ovarian cancer - Stage 1 disease, diagnosed in early 2011. S/p optimal surgical debulking (TAH/BSO, omentectomy, pelvic and periaortic LND) in March 2011 and 6 cycles of carbo/taxol in July 2011. She was undergoing surveillance since that time with no evidence of recurrence.  - Last seen by her oncologist at outside facility in January 2021.  CA-125 was 11, normal.  She was discharged from their office. -Clinically no signs or symptoms to suggest disease recurrence.  Routine imaging is not indicated as per NCCN guidelines.    - She gets annual CT of the chest for sarcoidosis monitoring at her pulmonologist office.  Recent CT chest, abdomen and pelvis on 07/19/2024 showed no concerning findings.  Seronegative rheumatoid arthritis (HCC) Persistent systemic symptoms including fevers, body pain, and sore throat since July. Symptoms improve with increased steroid dose but recur upon tapering. Current treatment includes methotrexate   and Enbrel .  Seronegative rheumatoid arthritis managed with methotrexate , etanercept , and oral  corticosteroids. Etiology of inflammatory symptoms remains unclear; differential includes sarcoidosis and other autoimmune conditions. Steroid dosing is titrated based on symptoms. Ongoing evaluation by pulmonology, rheumatology, and endocrinology. - Continued methotrexate  and etanercept . - Continued current corticosteroid regimen with symptom-based titration. - Ongoing evaluation by pulmonology, rheumatology, and endocrinology.  Sarcoidosis Sarcoidosis appears well-managed with no active disease. Recent CT chest, abdomen and pelvis on 07/19/2024 shows stable changes and improved lymph node in the right underarm area. No current concerns for cancer.  Bilateral pulmonary embolism (HCC) Bilateral pulmonary emboli involving both upper and lower lobes with unclear etiology, not provoked by recent travel, diagnosed on 09/18/2024.   She remains clinically stable on apixaban . Ongoing evaluation for prothrombotic conditions is indicated. Bleeding risk with anticoagulation is a consideration for upcoming procedures. - Prescribed apixaban  5 mg twice daily. - Recommended continuation of apixaban  for at least six months. - Ordered additional laboratory evaluation for underlying thrombophilia. - Planned repeat imaging prior to next follow-up. - Scheduled follow-up in six months. - Advised to defer colonoscopy for at least three months due to increased bleeding risk on apixaban . - Cleared for in-office laser eye procedure and dental cleaning while on apixaban ; advised caution with more invasive dental procedures. - Advised that driving clearance should be determined by cardiology based on cardiac evaluation.   Iron deficiency anemia, mixed picture Iron deficiency anemia is well controlled on current therapy. Hemoglobin improved to 12.5 g/dL. Platelet count is normal. Leukocytosis likely secondary to corticosteroid therapy. - Continued oral iron supplementation once daily. - Monitored hemoglobin and  complete blood count.  I spent a total of 42 minutes during this encounter with the patient including review of chart and various tests results, discussions about plan of care and coordination of care plan.  I reviewed lab results and outside records for this visit and discussed relevant results with the patient. Diagnosis, plan of care and treatment options were also discussed in detail with the patient. Opportunity provided to ask questions and answers provided to her apparent satisfaction. Provided instructions to call our clinic with any problems, questions or concerns prior to return visit. I recommended to continue follow-up with PCP and sub-specialists. She verbalized understanding and agreed with the plan. No barriers to learning was detected.  Julia Patten, MD  10/09/2024 5:24 PM  [facility] - A DEPT OF [facility] [id number] [place] [address] Dept: [phone] Dept Fax: [phone]   CHIEF COMPLAINT/ REASON FOR VISIT:  Follow-up for history of IgG lambda MGUS. Newly diagnosed bilateral pulmonary embolism and RLE DVT in December 2025.   INTERVAL HISTORY:  Discussed the use of AI scribe software for clinical note transcription with the patient, who gave verbal consent to proceed.  History of Present Illness  Julia Solis is a 74 year old female with bilateral pulmonary emboli who presents for hematology follow-up and anticoagulation management.  She was recently hospitalized in December 2025 for bilateral pulmonary emboli involving both upper and lower lobes, with a right lower extremity deep vein thrombosis identified below the knee on the posterior aspect. Her primary symptom was severe dyspnea. She denies right leg pain or swelling, recent prolonged immobility, or long-distance travel prior to the event. A CT scan in October 2025 was unremarkable.  She is currently taking apixaban  5 mg twice  daily and requires a prescription renewal, as she has only a few days  of medication remaining. She is scheduled to see cardiology today for evaluation of elevated troponin levels and has upcoming appointments with pulmonology, rheumatology, and endocrinology. She expresses uncertainty regarding which provider should manage her anticoagulation and driving restrictions. She inquires about the safety of undergoing an in-office laser eye procedure and dental cleaning while on apixaban , and is considering colonoscopy but is aware of the increased bleeding risk associated with anticoagulation.  She is undergoing surveillance for monoclonal gammopathy of undetermined significance, with recent blood draws for myeloma markers. She is being treated for iron deficiency anemia with oral iron, and her hemoglobin has improved to 12.5 g/dL. Platelet count is normal, and she has an elevated white blood cell count.  She is managed by rheumatology and pulmonology for autoimmune disease with methotrexate , etanercept , and ongoing oral steroids. Attempts to taper steroids result in fever, pain, and pharyngitis, which resolve with increased steroid dosing. She is seeking a second opinion regarding her autoimmune diagnosis in February. She continues to feel generally unwell and desires improved coordination among her multiple specialists.   SUMMARY OF HEMATOLOGIC HISTORY:  74 y.o. lady with a past medical history of sarcoidosis, pulmonary hypertension, sick sinus syndrome, hypertension, osteoporosis, renal stones, history of stage I ovarian cancer diagnosed in 2011, in remission, degenerative joint disease, was referred to our service for evaluation of monoclonal gammopathy.     Discussed the use of AI scribe software for clinical note transcription with the patient, who gave verbal consent to proceed.    She was recently referred to a rheumatologist by her PCP.  Her rheumatologist pursued workup for suspected polymyalgia  rheumatica (PMR) or rheumatoid arthritis (RA).  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  Immunoelectrophoresis showed faint IgG lambda monoclonal protein.  Given this finding, she was referred to us  for further evaluation.     To complete workup, on her initial consultation with us  on 08/03/2023, we obtained serum free light chains, repeat SPEP and obtain 24-hour urine protein electrophoresis and immunoelectrophoresis.  SPEP showed no evidence of M spike.  IFE showed faint IgG band.  Serum free kappa and lambda were close to normal with normal ratio.  24-hour urine protein electrophoresis also came back unremarkable.  No CRAB features.    Myeloma labs on 10/22/2023 showed 0.3 g/dL of M spike on SPEP, IFE showed it to be IgG lambda.  Quantitative immunoglobulins are within normal limits.  Serum free kappa was 24.9 mg/L, serum free lambda 25.8 mg/L, ratio normal at 0.97.  Repeated myeloma labs in April 2025 and in October 2025.  Overall stable M protein, sometimes too small to be measured.  Clinical picture remains consistent with MGUS.  Patient was provided reassurance.  Will continue surveillance.  Initially we rechecked labs in 3 months.  Since labs are stable overall, we will space her clinic intervals to every 6 to 12 months.  She was hospitalized on 09/18/2024 with complaints of shortness of breath and was found to have bilateral pulmonary emboli.  Initially she was on heparin  drip, later transitioned to Eliquis .  Though she had a long road trip in September 2025, no immediate antecedent provoking factors otherwise.  Pursued thrombophilia workup in clinic on 10/09/2024.    I have reviewed the past medical history, past surgical history, social history and family history with the patient and they are unchanged from previous note.  ALLERGIES: She is allergic to augmentin  [amoxicillin -pot clavulanate], latex, tramadol , ciprofloxacin, and flomax [tamsulosin  hcl].  MEDICATIONS:  Current  Outpatient Medications  Medication Sig Dispense Refill   alendronate  (FOSAMAX ) 70 MG tablet TAKE 1 TABLET BY MOUTH ONCE A WEEK. TAKE WITH A FULL GLASS OF WATER ON AN EMPTY STOMACH. 12 tablet 0   apixaban  (ELIQUIS ) 5 MG TABS tablet Take 1 tablet (5 mg total) by mouth 2 (two) times daily. 60 tablet 5   Cholecalciferol (VITAMIN D ) 50 MCG (2000 UT) tablet Take 2,000 Units by mouth daily.     etanercept  (ENBREL  MINI) 50 MG/ML injection Inject 1 mL (50 mg total) into the skin once a week. (Patient taking differently: Inject 50 mg into the skin every Wednesday. Inject 50mg  subcutaneously once per week on Wednesday late morning.) 12 mL 0   famotidine  (PEPCID ) 20 MG tablet Take 20 mg by mouth at bedtime.     Ferrous Sulfate (IRON) 325 (65 Fe) MG TABS Take 1 tablet by mouth daily with supper.     fluticasone (FLONASE) 50 MCG/ACT nasal spray Place 1 spray into the nose every Monday, Wednesday, and Friday.     folic acid  (FOLVITE ) 1 MG tablet Take 2 tablets (2 mg total) by mouth daily. (Patient taking differently: Take 2 mg by mouth daily with supper.) 180 tablet 3   furosemide  (LASIX ) 20 MG tablet Take 1 tablet (20 mg total) by mouth daily. (Patient taking differently: Take 20 mg by mouth daily as needed for edema or fluid.) 30 tablet 1   hydrocortisone  (CORTEF ) 10 MG tablet Take three tablets by mouth daily between 6 am to 8 am. Take one tablet by mouth around 2 pm daily.     LORazepam  (ATIVAN ) 1 MG tablet TAKE 1 TABLET BY MOUTH EVERY DAY AT BEDTIME AS NEEDED (Patient taking differently: Take 0.5-1 mg by mouth at bedtime. Take one-half (0.5mg ) to one whole (1mg ) tablet by mouth at bedtime for sleep.) 90 tablet 1   losartan  (COZAAR ) 100 MG tablet TAKE 1 TABLET BY MOUTH EVERY DAY 90 tablet 1   methotrexate  250 MG/10ML injection Inject 20 mg into the skin every Monday. INJECT 0.8 MLS (20 MG TOTAL) INTO THE SKIN ONCE A WEEK FOR 8 DOSES. VIAL SHOULD ACCOMMODATE UP TO 12 DOSES OF  MEDICATION.     Multiple Vitamin (MULTIVITAMIN WITH MINERALS) TABS tablet Take 1 tablet by mouth daily with supper.     oxyCODONE  (OXY IR/ROXICODONE ) 5 MG immediate release tablet Take 5 mg by mouth every 4 (four) hours as needed for moderate pain (pain score 4-6), severe pain (pain score 7-10) or breakthrough pain.     Polyethyl Glycol-Propyl Glycol (SYSTANE ULTRA) 0.4-0.3 % SOLN Place 1 drop into both eyes every morning.     Polyethyl Glycol-Propyl Glycol (SYSTANE) 0.4-0.3 % GEL ophthalmic gel Place 1 application  into both eyes at bedtime.     predniSONE  (DELTASONE ) 5 MG tablet Take 5-7.5 mg by mouth daily with breakfast. Take one and one-half tablets (7.5mg ) by mouth once daily in the morning with breakfast.  May take an additional one tablet mg) by mouth as needed for active symptoms.     rosuvastatin  (CRESTOR ) 5 MG tablet TAKE 1 TABLET BY MOUTH 3 TIMES A WEEK WITH SUPPER (Patient taking differently: Take 5 mg by mouth every Monday, Wednesday, and Friday. TAKE 1 TABLET BY MOUTH 3 TIMES A WEEK ON M-W-F WITH SUPPER) 36 tablet 2   sodium chloride  (OCEAN) 0.65 % SOLN nasal spray Place 1 spray into both nostrils as needed for congestion.     No current facility-administered medications for this visit.  REVIEW OF SYSTEMS:    ROS  All other pertinent systems were reviewed with the patient and are negative.  PHYSICAL EXAMINATION:   Onc Performance Status - 10/09/24 1109       ECOG Perf Status   ECOG Perf Status Ambulatory and capable of all selfcare but unable to carry out any work activities.  Up and about more than 50% of waking hours      KPS SCALE   KPS % SCORE Cares for self, unable to carry on normal activity or to do active work           Vitals:   10/09/24 1101 10/09/24 1109  BP: (!) 158/73   Pulse:    Resp:    Temp:  98.5 F (36.9 C)  SpO2:      Filed Weights   10/09/24 1058  Weight: 181 lb 14.4 oz (82.5 kg)     Physical Exam Constitutional:      General:  She is not in acute distress.    Appearance: Normal appearance.  HENT:     Head: Normocephalic and atraumatic.  Eyes:     General: No scleral icterus.    Conjunctiva/sclera: Conjunctivae normal.  Cardiovascular:     Rate and Rhythm: Normal rate and regular rhythm.     Heart sounds: Normal heart sounds.  Pulmonary:     Effort: Pulmonary effort is normal.     Breath sounds: Normal breath sounds.  Abdominal:     General: There is no distension.  Musculoskeletal:     Right lower leg: No edema.     Left lower leg: No edema.  Neurological:     General: No focal deficit present.     Mental Status: She is alert and oriented to person, place, and time.  Psychiatric:        Mood and Affect: Mood normal.        Behavior: Behavior normal.        Thought Content: Thought content normal.     LABORATORY DATA:   I have reviewed the data as listed.  Results for orders placed or performed in visit on 10/09/24  Ferritin  Result Value Ref Range   Ferritin 114 11 - 307 ng/mL  Iron and TIBC  Result Value Ref Range   Iron 26 (L) 28 - 170 ug/dL   TIBC 727 749 - 549 ug/dL   Saturation Ratios 10 (L) 10.4 - 31.8 %   UIBC 246 ug/dL  Lactate dehydrogenase  Result Value Ref Range   LDH 235 105 - 235 U/L  CMP (Cancer Center only)  Result Value Ref Range   Sodium 142 135 - 145 mmol/L   Potassium 3.2 (L) 3.5 - 5.1 mmol/L   Chloride 101 98 - 111 mmol/L   CO2 30 22 - 32 mmol/L   Glucose, Bld 108 (H) 70 - 99 mg/dL   BUN 18 8 - 23 mg/dL   Creatinine 9.16 9.55 - 1.00 mg/dL   Calcium  9.1 8.9 - 10.3 mg/dL   Total Protein 6.8 6.5 - 8.1 g/dL   Albumin 3.8 3.5 - 5.0 g/dL   AST 19 15 - 41 U/L   ALT 9 0 - 44 U/L   Alkaline Phosphatase 78 38 - 126 U/L   Total Bilirubin 0.6 0.0 - 1.2 mg/dL   GFR, Estimated >39 >39 mL/min   Anion gap 11 5 - 15  CBC with Differential (Cancer Center Only)  Result Value Ref Range   WBC Count 17.3 (  H) 4.0 - 10.5 K/uL   RBC 4.30 3.87 - 5.11 MIL/uL   Hemoglobin 12.5  12.0 - 15.0 g/dL   HCT 60.2 63.9 - 53.9 %   MCV 92.3 80.0 - 100.0 fL   MCH 29.1 26.0 - 34.0 pg   MCHC 31.5 30.0 - 36.0 g/dL   RDW 80.8 (H) 88.4 - 84.4 %   Platelet Count 249 150 - 400 K/uL   nRBC 0.0 0.0 - 0.2 %   Neutrophils Relative % 89 %   Neutro Abs 15.4 (H) 1.7 - 7.7 K/uL   Lymphocytes Relative 5 %   Lymphs Abs 0.9 0.7 - 4.0 K/uL   Monocytes Relative 4 %   Monocytes Absolute 0.7 0.1 - 1.0 K/uL   Eosinophils Relative 1 %   Eosinophils Absolute 0.2 0.0 - 0.5 K/uL   Basophils Relative 0 %   Basophils Absolute 0.0 0.0 - 0.1 K/uL   Immature Granulocytes 1 %   Abs Immature Granulocytes 0.09 (H) 0.00 - 0.07 K/uL  D-dimer, quantitative  Result Value Ref Range   D-Dimer, Quant 0.68 (H) 0.00 - 0.50 ug/mL-FEU      RADIOGRAPHIC STUDIES:  No recent pertinent imaging studies available to review.  Orders Placed This Encounter  Procedures   D-dimer, quantitative    Standing Status:   Future    Number of Occurrences:   1    Expiration Date:   10/09/2025   Beta-2-glycoprotein i abs, IgG/M/A    Standing Status:   Future    Number of Occurrences:   1    Expiration Date:   10/09/2025   Cardiolipin antibodies, IgG, IgM, IgA    Standing Status:   Future    Number of Occurrences:   1    Expiration Date:   10/09/2025   Lupus anticoagulant panel    Standing Status:   Future    Number of Occurrences:   1    Expiration Date:   10/09/2025   Prothrombin gene mutation    Standing Status:   Future    Number of Occurrences:   1    Expiration Date:   10/09/2025   Factor 5 leiden    Standing Status:   Future    Number of Occurrences:   1    Expiration Date:   10/09/2025     Future Appointments  Date Time Provider Department Center  10/12/2024  2:10 PM Cheryl Waddell HERO, PA-C [facility] None  11/02/2024  2:30 PM Arletta Camie FORBES DEVONNA [facility]  11/08/2024  2:00 PM [facility] H&V  11/09/2024  7:05 AM [facility] HVT DEVICE REMOTES [facility] H&V  11/22/2024 11:20 AM Janene Boer, PA [facility] H&V   01/02/2025  1:10 PM Karis Clunes, MD [facility] None  02/08/2025  7:05 AM [facility] HVT DEVICE REMOTES [facility] H&V  04/02/2025 11:30 AM [facility] PHLEBOTOMIST [facility] None  04/02/2025 11:45 AM Johonna Binette, MD [facility] None  05/10/2025  7:05 AM [facility] HVT DEVICE REMOTES [facility] H&V  07/17/2025  1:00 PM [facility] PHLEBOTOMIST [facility] None  07/31/2025 11:45 AM Madason Rauls, MD [facility] None  08/09/2025  7:05 AM [facility] HVT DEVICE REMOTES [facility] H&V  08/27/2025  9:30 AM [facility] [address]  09/03/2025  2:00 PM Baxley, Ronal PARAS, MD [facility] [address]     This document was completed utilizing speech recognition software. Grammatical errors, random word insertions, pronoun errors, and incomplete sentences are an occasional consequence of this system due to software limitations, ambient noise, and hardware issues. Any formal  questions or concerns about the content, text or information contained within the body of this dictation should be directly addressed to the provider for clarification.  "

## 2024-10-09 NOTE — Progress Notes (Signed)
 "   OFFICE CONSULT NOTE  Chief Complaint:  Evaluate elevated troponin  Primary Care Physician: Perri Ronal PARAS, MD  HPI:  Julia Solis is a 74 y.o. female who is being seen today for the evaluation of elevated troponin at the request of Baxley, Ronal PARAS, MD. This is a pleasant 74 year old female kindly referred by Dr. Perri for evaluation of elevated troponin.  She was recently hospitalized with multiple subsegmental pulmonary emboli in December and was heparinized and switched to Eliquis .  She was seen earlier today by hematology who is undertaking a hypercoagulable workup.  There was no clear proceeding event prior to her pulmonary emboli.  She does have a remote history of ovarian cancer and other medical problems including rheumatoid arthritis, history of sick sinus syndrome status post pacemaker (followed by Dr. Nancey), sarcoidosis, sleep apnea, MGUS, adrenal insufficiency and other medical problems.  When she was diagnosed with acute PE she underwent an echocardiogram for elevated troponins.  Her hospital troponins levels were 290, 311 and 363.  BNP was also elevated at 687.  Echo showed normal LVEF with signs of RV volume overload, normal RV systolic function and moderate RV enlargement with an RVSP of 48.2 mmHg.  There was mild to moderate biatrial enlargement and moderate tricuspid regurgitation.  These findings were new compared to a prior echo in 2024.  She was recently started on diuretics by her primary care provider for some lower extremity edema and reports some improvement in her swelling.  PMHx:  Past Medical History:  Diagnosis Date   Arthritis 2024   Cancer [facility]) 2011   ovarian   Cataract 2024   Complication of anesthesia    GERD (gastroesophageal reflux disease)    Hearing aid worn    both ears   History of chemotherapy 2011   History of hiatal hernia    History of kidney stones    Hypertension    MGUS (monoclonal gammopathy of unknown significance)    dx by  hematology per patient   Neuromuscular disorder (HCC)    left foot numbness since  eback surgery   Osteoporosis    PONV (postoperative nausea and vomiting)    Presence of permanent cardiac pacemaker    Sarcoidosis    in remission sees pumonary  once a year dr bellinger lov 08-21-2020 care everywhere   Sick sinus syndrome [facility])    Sleep apnea 2024   Wears glasses     Past Surgical History:  Procedure Laterality Date   ABDOMINAL HYSTERECTOMY  2011   APPENDECTOMY  2011    ?with hysterectomy   BACK SURGERY  1997   lumbar   CARPAL TUNNEL RELEASE Right    CATARACT EXTRACTION Right 08/10/2023   CATARACT EXTRACTION Left 08/24/2023   CESAREAN SECTION  1982, 1985   COSMETIC SURGERY  2007   brow lift   CYSTOSCOPY WITH RETROGRADE PYELOGRAM, URETEROSCOPY AND STENT PLACEMENT Left 02/04/2021   Procedure: CYSTOSCOPY WITH RETROGRADE PYELOGRAM, URETEROSCOPY AND STENT PLACEMENT;  Surgeon: Rosalind Zachary NOVAK, MD;  Location: [facility];  Service: Urology;  Laterality: Left;  1 HR; nephrolithiasis   ECTOPIC PREGNANCY SURGERY  1980   EXTRACORPOREAL SHOCK WAVE LITHOTRIPSY Left 06/23/2018   Procedure: LEFT EXTRACORPOREAL SHOCK WAVE LITHOTRIPSY (ESWL);  Surgeon: Sherrilee Belvie CROME, MD;  Location: [facility];  Service: Urology;  Laterality: Left;   GANGLION CYST EXCISION  1975   HOLMIUM LASER APPLICATION Left 02/04/2021   Procedure: HOLMIUM LASER APPLICATION;  Surgeon: Rosalind Zachary NOVAK, MD;  Location: [facility];  Service: Urology;  Laterality: Left;   PACEMAKER IMPLANT N/A 11/13/2020   Procedure: PACEMAKER IMPLANT;  Surgeon: Kelsie Agent, MD;  Location: [facility];  Service: Cardiovascular;  Laterality: N/A;   SPINE SURGERY  1997   TONSILLECTOMY  1957   adenoids removed    FAMHx:  Family History  Problem Relation Age of Onset   Hypertension Mother    Hearing loss Mother    Prostate cancer Father 26   Cancer Father    Hypertension Father    Stroke Father     Colon polyps Sister    Colon cancer Maternal Grandmother        dx in her 92s   Diabetes Maternal Grandmother    Heart disease Maternal Grandfather    Hearing loss Maternal Grandfather    Colon cancer Paternal Grandmother    Heart disease Paternal Grandfather    Breast cancer Paternal Aunt 54   Colon cancer Paternal Aunt        dx in her 59s   Melanoma Cousin 92   Breast cancer Cousin 8       multiple cousins   Colon polyps Cousin    Breast cancer Cousin 29   Breast cancer Cousin        father's maternal 1st cousin   Breast cancer Cousin        Father's first cousin's daughter   Cancer Other        maternal great uncle with Cancer - NOS   Cancer Other        maternal great grandmother with either colon or stomach cancer   Breast cancer Other        fathers maternal aunt    SOCHx:   reports that she quit smoking about 48 years ago. Her smoking use included cigarettes. She started smoking about 59 years ago. She has a 16.5 pack-year smoking history. She has been exposed to tobacco smoke. She has never used smokeless tobacco. She reports that she does not currently use alcohol. She reports that she does not use drugs.  ALLERGIES:  Allergies[1]  ROS: Pertinent items noted in HPI and remainder of comprehensive ROS otherwise negative.  HOME MEDS: Medications Ordered Prior to Encounter[2]  LABS/IMAGING: Results for orders placed or performed in visit on 10/09/24 (from the past 48 hours)  Iron and TIBC     Status: Abnormal   Collection Time: 10/09/24 10:44 AM  Result Value Ref Range   Iron 26 (L) 28 - 170 ug/dL   TIBC 727 749 - 549 ug/dL   Saturation Ratios 10 (L) 10.4 - 31.8 %   UIBC 246 ug/dL    Comment: Performed at [facility], [address], [place], [place] [address]  Lactate dehydrogenase     Status: None   Collection Time: 10/09/24 10:44 AM  Result Value Ref Range   LDH 235 105 - 235 U/L    Comment: Performed at [facility], [address], [place], [place] [address]  CMP (Cancer Center only)     Status: Abnormal   Collection Time: 10/09/24 10:44 AM  Result Value Ref Range   Sodium 142 135 - 145 mmol/L   Potassium 3.2 (L) 3.5 - 5.1 mmol/L   Chloride 101 98 - 111 mmol/L   CO2 30 22 - 32 mmol/L   Glucose, Bld 108 (H) 70 - 99 mg/dL    Comment: Glucose reference range applies only to samples taken after  fasting for at least 8 hours.   BUN 18 8 - 23 mg/dL   Creatinine 9.16 9.55 - 1.00 mg/dL   Calcium  9.1 8.9 - 10.3 mg/dL   Total Protein 6.8 6.5 - 8.1 g/dL   Albumin 3.8 3.5 - 5.0 g/dL   AST 19 15 - 41 U/L   ALT 9 0 - 44 U/L   Alkaline Phosphatase 78 38 - 126 U/L   Total Bilirubin 0.6 0.0 - 1.2 mg/dL   GFR, Estimated >39 >39 mL/min    Comment: (NOTE) Calculated using the CKD-EPI Creatinine Equation (2021)    Anion gap 11 5 - 15    Comment: Performed at [facility], [address], [place], [place] [address]  CBC with Differential (Cancer Center Only)     Status: Abnormal   Collection Time: 10/09/24 10:44 AM  Result Value Ref Range   WBC Count 17.3 (H) 4.0 - 10.5 K/uL   RBC 4.30 3.87 - 5.11 MIL/uL   Hemoglobin 12.5 12.0 - 15.0 g/dL   HCT 60.2 63.9 - 53.9 %   MCV 92.3 80.0 - 100.0 fL   MCH 29.1 26.0 - 34.0 pg   MCHC 31.5 30.0 - 36.0 g/dL   RDW 80.8 (H) 88.4 - 84.4 %   Platelet Count 249 150 - 400 K/uL   nRBC 0.0 0.0 - 0.2 %   Neutrophils Relative % 89 %   Neutro Abs 15.4 (H) 1.7 - 7.7 K/uL   Lymphocytes Relative 5 %   Lymphs Abs 0.9 0.7 - 4.0 K/uL   Monocytes Relative 4 %   Monocytes Absolute 0.7 0.1 - 1.0 K/uL   Eosinophils Relative 1 %   Eosinophils Absolute 0.2 0.0 - 0.5 K/uL   Basophils Relative 0 %   Basophils Absolute 0.0 0.0 - 0.1 K/uL   Immature Granulocytes 1 %   Abs Immature Granulocytes 0.09 (H) 0.00 - 0.07 K/uL    Comment: Performed at [facility], [address], [place], [place] [address]  Ferritin     Status: None   Collection Time:  10/09/24 10:45 AM  Result Value Ref Range   Ferritin 114 11 - 307 ng/mL    Comment: Performed at [facility], [address] [place], [place] [address]  D-dimer, quantitative     Status: Abnormal   Collection Time: 10/09/24 12:00 PM  Result Value Ref Range   D-Dimer, Quant 0.68 (H) 0.00 - 0.50 ug/mL-FEU    Comment: (NOTE) At the manufacturer cut-off value of 0.5 g/mL FEU, this assay has a negative predictive value of 95-100%.This assay is intended for use in conjunction with a clinical pretest probability (PTP) assessment model to exclude pulmonary embolism (PE) and deep venous thrombosis (DVT) in outpatients suspected of PE or DVT. Results should be correlated with clinical presentation. Performed at [facility], [address], [place], [place] [address]    No results found.  LIPID PANEL:    Component Value Date/Time   CHOL 204 (H) 08/22/2024 0953   TRIG 101 08/22/2024 0953   HDL 73 08/22/2024 0953   CHOLHDL 2.8 08/22/2024 0953   VLDL 16 04/12/2017 1036   LDLCALC 111 (H) 08/22/2024 0953    No results found for: LIPOA    WEIGHTS: Wt Readings from Last 3 Encounters:  10/09/24 181 lb (82.1 kg)  10/09/24 181 lb 14.4 oz (82.5 kg)  09/25/24 185 lb (83.9 kg)    VITALS: BP 125/62 (BP Location: Left Arm)   Pulse 70  Ht 5' 4 (1.626 m)   Wt 181 lb (82.1 kg)   SpO2 97%   BMI 31.07 kg/m   EXAM: General appearance: alert and no distress Neck: no carotid bruit, no JVD, and thyroid  not enlarged, symmetric, no tenderness/mass/nodules Lungs: clear to auscultation bilaterally Heart: regular rate and rhythm Abdomen: soft, non-tender; bowel sounds normal; no masses,  no organomegaly Extremities: extremities normal, atraumatic, no cyanosis or edema Pulses: 2+ and symmetric Skin: Skin color, texture, turgor normal. No rashes or lesions Neurologic: Grossly normal Psych: Pleasant  EKG:  ER EKG from 09/19/2024 reviewed,  demonstrating normal sinus rhythm with rightward axis- personally reviewed  ASSESSMENT: Recent acute multiple subsegmental pulmonary emboli Signs of acute right heart strain, likely causative of elevated troponin History of pacemaker for sick sinus syndrome  PLAN: 1.   Julia Solis had multiple subsegmental pulmonary emboli with associated shortness of breath and severe hypoxemia with an SpO2 of 61% initially.  She reports improvement in her symptoms and now is on Eliquis  with a hypercoagulable workup undergoing.  SpO2 today is 97% on room air.  Her edema has improved on diuretic which I would recommend continuing.  She is probably not completely cleared her pulmonary emboli at this point.  I would advise a repeat limited echo in about a month to see if her RVSP has improved as well as her RV volume overload.  I do not suspect MI as a cause of her elevated troponin as they are fairly flat although moderately elevated.  Is most likely due to right heart strain in the setting of pulmonary emboli.  She is denied any anginal symptoms.  Thanks again for the kind referral.  Plan follow-up with me or APP in about 4 to 6 weeks after echo.  Vinie KYM Maxcy, MD, [facility], FNLA, FACP  [place]  [facility]  Medical Director of the [facility]  Card[facility]ovascular Risk Reduction Clinic Diplomate of the American Board of Clinical Lipidology Attending Cardiologist  Direct Dial: [phone]  Fax: [phone]  Website:  [email or web address]  Vinie JAYSON Maxcy 10/09/2024, 8:26 PM     [1]  Allergies Allergen Reactions   Augmentin  [Amoxicillin -Pot Clavulanate] Nausea And Vomiting and Other (See Comments)    Diarrhea    Latex Other (See Comments)    Skin irritation   Tramadol  Other (See Comments)    jittery, insomnia   Ciprofloxacin Rash   Flomax [Tamsulosin Hcl] Other (See Comments)    Dizziness  [2]  Current Outpatient Medications on File Prior to Visit  Medication Sig Dispense Refill    alendronate  (FOSAMAX ) 70 MG tablet TAKE 1 TABLET BY MOUTH ONCE A WEEK. TAKE WITH A FULL GLASS OF WATER ON AN EMPTY STOMACH. 12 tablet 0   apixaban  (ELIQUIS ) 5 MG TABS tablet Take 1 tablet (5 mg total) by mouth 2 (two) times daily. 60 tablet 5   Cholecalciferol (VITAMIN D ) 50 MCG (2000 UT) tablet Take 2,000 Units by mouth daily.     etanercept  (ENBREL  MINI) 50 MG/ML injection Inject 1 mL (50 mg total) into the skin once a week. (Patient taking differently: Inject 50 mg into the skin every Wednesday. Inject 50mg  subcutaneously once per week on Wednesday late morning.) 12 mL 0   famotidine  (PEPCID ) 20 MG tablet Take 20 mg by mouth at bedtime.     Ferrous Sulfate (IRON) 325 (65 Fe) MG TABS Take 1 tablet by mouth daily with supper.     fluticasone (FLONASE) 50 MCG/ACT nasal spray Place 1 spray  into the nose every Monday, Wednesday, and Friday.     folic acid  (FOLVITE ) 1 MG tablet Take 2 tablets (2 mg total) by mouth daily. (Patient taking differently: Take 2 mg by mouth daily with supper.) 180 tablet 3   furosemide  (LASIX ) 20 MG tablet Take 1 tablet (20 mg total) by mouth daily. (Patient taking differently: Take 20 mg by mouth daily as needed for edema or fluid.) 30 tablet 1   hydrocortisone  (CORTEF ) 10 MG tablet Take three tablets by mouth daily between 6 am to 8 am. Take one tablet by mouth around 2 pm daily.     LORazepam  (ATIVAN ) 1 MG tablet TAKE 1 TABLET BY MOUTH EVERY DAY AT BEDTIME AS NEEDED (Patient taking differently: Take 0.5-1 mg by mouth at bedtime. Take one-half (0.5mg ) to one whole (1mg ) tablet by mouth at bedtime for sleep.) 90 tablet 1   losartan  (COZAAR ) 100 MG tablet TAKE 1 TABLET BY MOUTH EVERY DAY 90 tablet 1   methotrexate  250 MG/10ML injection Inject 20 mg into the skin every Monday. INJECT 0.8 MLS (20 MG TOTAL) INTO THE SKIN ONCE A WEEK FOR 8 DOSES. VIAL SHOULD ACCOMMODATE UP TO 12 DOSES OF MEDICATION.     Multiple Vitamin (MULTIVITAMIN WITH MINERALS) TABS tablet Take 1 tablet by  mouth daily with supper.     oxyCODONE  (OXY IR/ROXICODONE ) 5 MG immediate release tablet Take 5 mg by mouth every 4 (four) hours as needed for moderate pain (pain score 4-6), severe pain (pain score 7-10) or breakthrough pain.     Polyethyl Glycol-Propyl Glycol (SYSTANE ULTRA) 0.4-0.3 % SOLN Place 1 drop into both eyes every morning.     Polyethyl Glycol-Propyl Glycol (SYSTANE) 0.4-0.3 % GEL ophthalmic gel Place 1 application  into both eyes at bedtime.     predniSONE  (DELTASONE ) 5 MG tablet Take 5-7.5 mg by mouth daily with breakfast. Take one and one-half tablets (7.5mg ) by mouth once daily in the morning with breakfast.  May take an additional one tablet mg) by mouth as needed for active symptoms.     rosuvastatin  (CRESTOR ) 5 MG tablet TAKE 1 TABLET BY MOUTH 3 TIMES A WEEK WITH SUPPER (Patient taking differently: Take 5 mg by mouth every Monday, Wednesday, and Friday. TAKE 1 TABLET BY MOUTH 3 TIMES A WEEK ON M-W-F WITH SUPPER) 36 tablet 2   sodium chloride  (OCEAN) 0.65 % SOLN nasal spray Place 1 spray into both nostrils as needed for congestion.     No current facility-administered medications on file prior to visit.   "

## 2024-10-09 NOTE — Assessment & Plan Note (Signed)
-   Stage 1 disease, diagnosed in early 2011. S/p optimal surgical debulking (TAH/BSO, omentectomy, pelvic and periaortic LND) in March 2011 and 6 cycles of carbo/taxol in July 2011. She was undergoing surveillance since that time with no evidence of recurrence.  - Last seen by her oncologist at outside facility in January 2021.  CA-125 was 11, normal.  She was discharged from their office. -Clinically no signs or symptoms to suggest disease recurrence.  Routine imaging is not indicated as per NCCN guidelines.    - She gets annual CT of the chest for sarcoidosis monitoring at her pulmonologist office.  Recent CT chest, abdomen and pelvis on 07/19/2024 showed no concerning findings.

## 2024-10-09 NOTE — Assessment & Plan Note (Addendum)
 Persistent systemic symptoms including fevers, body pain, and sore throat since July. Symptoms improve with increased steroid dose but recur upon tapering. Current treatment includes methotrexate  and Enbrel .  Seronegative rheumatoid arthritis managed with methotrexate , etanercept , and oral corticosteroids. Etiology of inflammatory symptoms remains unclear; differential includes sarcoidosis and other autoimmune conditions. Steroid dosing is titrated based on symptoms. Ongoing evaluation by pulmonology, rheumatology, and endocrinology. - Continued methotrexate  and etanercept . - Continued current corticosteroid regimen with symptom-based titration. - Ongoing evaluation by pulmonology, rheumatology, and endocrinology.

## 2024-10-09 NOTE — Assessment & Plan Note (Signed)
 Sarcoidosis appears well-managed with no active disease. Recent CT chest, abdomen and pelvis on 07/19/2024 shows stable changes and improved lymph node in the right underarm area. No current concerns for cancer.

## 2024-10-09 NOTE — Patient Instructions (Signed)
 Medication Instructions:  NO CHANGES  *If you need a refill on your cardiac medications before your next appointment, please call your pharmacy*  Testing/Procedures: Your physician has requested that you have a limited echocardiogram in 1 month. Echocardiography is a painless test that uses sound waves to create images of your heart. It provides your doctor with information about the size and shape of your heart and how well your hearts chambers and valves are working. This procedure takes approximately one hour. There are no restrictions for this procedure. Please do NOT wear cologne, perfume, aftershave, or lotions (deodorant is allowed). Please arrive 15 minutes prior to your appointment time.  Please note: We ask at that you not bring children with you during ultrasound (echo/ vascular) testing. Due to room size and safety concerns, children are not allowed in the ultrasound rooms during exams. Our front office staff cannot provide observation of children in our lobby area while testing is being conducted. An adult accompanying a patient to their appointment will only be allowed in the ultrasound room at the discretion of the ultrasound technician under special circumstances. We apologize for any inconvenience.   Follow-Up: At [facility], you and your health needs are our priority.  As part of our continuing mission to provide you with exceptional heart care, our providers are all part of one team.  This team includes your primary Cardiologist (physician) and Advanced Practice Providers or APPs (Physician Assistants and Nurse Practitioners) who all work together to provide you with the care you need, when you need it.  Your next appointment:    4-6 weeks with Dr. Mona or NP/PA -- after echo  We recommend signing up for the patient portal called MyChart.  Sign up information is provided on this After Visit Summary.  MyChart is used to connect with patients for Virtual Visits  (Telemedicine).  Patients are able to view lab/test results, encounter notes, upcoming appointments, etc.  Non-urgent messages can be sent to your provider as well.   To learn more about what you can do with MyChart, go to [email or web address].   Other Instructions

## 2024-10-09 NOTE — Assessment & Plan Note (Signed)
 Bilateral pulmonary emboli involving both upper and lower lobes with unclear etiology, not provoked by recent travel, diagnosed on 09/18/2024.   She remains clinically stable on apixaban . Ongoing evaluation for prothrombotic conditions is indicated. Bleeding risk with anticoagulation is a consideration for upcoming procedures. - Prescribed apixaban  5 mg twice daily. - Recommended continuation of apixaban  for at least six months. - Ordered additional laboratory evaluation for underlying thrombophilia. - Planned repeat imaging prior to next follow-up. - Scheduled follow-up in six months. - Advised to defer colonoscopy for at least three months due to increased bleeding risk on apixaban . - Cleared for in-office laser eye procedure and dental cleaning while on apixaban ; advised caution with more invasive dental procedures. - Advised that driving clearance should be determined by cardiology based on cardiac evaluation.

## 2024-10-10 LAB — KAPPA/LAMBDA LIGHT CHAINS
Kappa free light chain: 12.4 mg/L (ref 3.3–19.4)
Kappa, lambda light chain ratio: 0.49 (ref 0.26–1.65)
Lambda free light chains: 25.2 mg/L (ref 5.7–26.3)

## 2024-10-11 LAB — MULTIPLE MYELOMA PANEL, SERUM
Albumin SerPl Elph-Mcnc: 3.1 g/dL (ref 2.9–4.4)
Albumin/Glob SerPl: 1.2 (ref 0.7–1.7)
Alpha 1: 0.3 g/dL (ref 0.0–0.4)
Alpha2 Glob SerPl Elph-Mcnc: 0.8 g/dL (ref 0.4–1.0)
B-Globulin SerPl Elph-Mcnc: 1.1 g/dL (ref 0.7–1.3)
Gamma Glob SerPl Elph-Mcnc: 0.6 g/dL (ref 0.4–1.8)
Globulin, Total: 2.7 g/dL (ref 2.2–3.9)
IgA: 92 mg/dL (ref 64–422)
IgG (Immunoglobin G), Serum: 793 mg/dL (ref 586–1602)
IgM (Immunoglobulin M), Srm: 103 mg/dL (ref 26–217)
Total Protein ELP: 5.8 g/dL — ABNORMAL LOW (ref 6.0–8.5)

## 2024-10-11 LAB — CARDIOLIPIN ANTIBODIES, IGG, IGM, IGA
Anticardiolipin IgA: <9 U/mL (ref 0–11)
Anticardiolipin IgG: <9 GPL U/mL (ref 0–14)
Anticardiolipin IgM: <9 [MPL'U]/mL (ref 0–12)

## 2024-10-11 NOTE — Progress Notes (Signed)
 "  Office Visit Note  Patient: Julia Solis             Date of Birth: 30-Mar-1951           MRN: [id number]             PCP: Perri Ronal PARAS, MD Referring: Perri Ronal PARAS, MD Visit Date: 10/12/2024 Occupation: Data Unavailable  Subjective:  Medication management  History of Present Illness: Julia Solis is a 74 y.o. female with rheumatoid arthritis.  She returns today after her last visit on July 12, 2024.  She states in the first week of December she started having some shortness of breath.  She was evaluated by her PCP and was sent to the emergency room.  Fair she had extensive workup and was diagnosed with bilateral pulmonary embolism and a DVT.  She was evaluated by cardiology, pulmonologist and hematologist.  She was advised to stay on Eliquis .  She is on hydrocortisone  40 mg a day as prescribed by Dr. Faythe.  She states her pulmonologist also added prednisone  7.5 mg p.o. daily as she felt her symptoms could be coming from sarcoidosis.  She will have a repeat echocardiogram by the cardiologist.  She continues to have intermittent fevers.  She states when she cuts down on the prednisone  her fever comes back.  The last episode was a week ago.  She continues to have some discomfort in her hands.  She describes pain on the scale of 0-10 about 2.    Activities of Daily Living:  Patient reports morning stiffness for many hours.   Patient Reports nocturnal pain.  Difficulty dressing/grooming: Denies Difficulty climbing stairs: Reports Difficulty getting out of chair: Denies Difficulty using hands for taps, buttons, cutlery, and/or writing: Reports  Review of Systems  Constitutional:  Positive for fatigue.  HENT:  Negative for mouth sores and mouth dryness.   Eyes:  Positive for dryness.  Respiratory:  Positive for shortness of breath.   Cardiovascular:  Negative for chest pain and palpitations.  Gastrointestinal:  Positive for constipation. Negative for blood in stool and  diarrhea.  Endocrine: Positive for increased urination.  Genitourinary:  Negative for involuntary urination.  Musculoskeletal:  Positive for joint pain, gait problem, joint pain, myalgias, muscle weakness, morning stiffness, muscle tenderness and myalgias. Negative for joint swelling.  Skin:  Negative for color change, rash, hair loss and sensitivity to sunlight.  Allergic/Immunologic: Positive for susceptible to infections.  Neurological:  Negative for dizziness and headaches.  Hematological:  Negative for swollen glands.  Psychiatric/Behavioral:  Positive for sleep disturbance. Negative for depressed mood. The patient is not nervous/anxious.     PMFS History:  Patient Active Problem List   Diagnosis Date Noted   Bilateral pulmonary embolism (HCC) 09/20/2024   Adrenal insufficiency 09/19/2024   Acute pulmonary embolism (HCC) 09/18/2024   Lung nodule 09/18/2024   Elevated troponin 09/18/2024   Sleep apnea    Chronic rhinitis 07/05/2024   Acute recurrent maxillary sinusitis 12/14/2023   Deviated nasal septum 12/14/2023   Hypertrophy of nasal turbinates 12/14/2023   Seronegative rheumatoid arthritis (HCC) 08/23/2023   Monoclonal gammopathy of unknown significance 08/03/2023   Primary osteoarthritis involving multiple joints 08/03/2023   Edema 06/26/2023   Hand arthritis 04/27/2023   Symptomatic sinus bradycardia 03/20/2022   Pacemaker 03/20/2022   Sarcoidosis 10/31/2018   Dry eye syndrome 10/16/2015   NS (nuclear sclerosis) 10/16/2015   Dystrophy, 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   Dry eye syndrome of both lacrimal glands 10/16/2015  Elevated LDL cholesterol level 02/26/2015   Back pain 02/20/2014   Insomnia 02/20/2014   Allergic rhinitis 02/20/2014   Osteopenia 07/07/2012   Fatigue 08/17/2011   Nonspecific (abnormal) findings on radiological and other examination of body structure 06/24/2010   ABNORMAL LUNG XRAY 06/24/2010   History of ovarian cancer 06/23/2010    Hypertension 06/23/2010    Past Medical History:  Diagnosis Date   Arthritis 2024   Cancer [facility]) 2011   ovarian   Cataract 2024   Complication of anesthesia    DVT (deep venous thrombosis) (HCC)    GERD (gastroesophageal reflux disease)    Hearing aid worn    both ears   History of chemotherapy 2011   History of hiatal hernia    History of kidney stones    Hypertension    MGUS (monoclonal gammopathy of unknown significance)    dx by hematology per patient   Neuromuscular disorder (HCC)    left foot numbness since  eback surgery   Osteoporosis    PONV (postoperative nausea and vomiting)    Presence of permanent cardiac pacemaker    Pulmonary embolism (HCC)    Sarcoidosis    in remission sees pumonary  once a year dr bellinger lov 08-21-2020 care everywhere   Sick sinus syndrome [facility])    Sleep apnea 2024   Wears glasses     Family History  Problem Relation Age of Onset   Hypertension Mother    Hearing loss Mother    Prostate cancer Father 50   Cancer Father    Hypertension Father    Stroke Father    Colon polyps Sister    Colon cancer Maternal Grandmother        dx in her 93s   Diabetes Maternal Grandmother    Heart disease Maternal Grandfather    Hearing loss Maternal Grandfather    Colon cancer Paternal Grandmother    Heart disease Paternal Grandfather    Breast cancer Paternal Aunt 98   Colon cancer Paternal Aunt        dx in her 40s   Melanoma Cousin 59   Breast cancer Cousin 66       multiple cousins   Colon polyps Cousin    Breast cancer Cousin 46   Breast cancer Cousin        father's maternal 1st cousin   Breast cancer Cousin        Father's first cousin's daughter   Cancer Other        maternal great uncle with Cancer - NOS   Cancer Other        maternal great grandmother with either colon or stomach cancer   Breast cancer Other        fathers maternal aunt   Past Surgical History:  Procedure Laterality Date   ABDOMINAL HYSTERECTOMY  2011    APPENDECTOMY  2011    ?with hysterectomy   BACK SURGERY  1997   lumbar   CARPAL TUNNEL RELEASE Right    CATARACT EXTRACTION Right 08/10/2023   CATARACT EXTRACTION Left 08/24/2023   CESAREAN SECTION  1982, 1985   COSMETIC SURGERY  2007   brow lift   CYSTOSCOPY WITH RETROGRADE PYELOGRAM, URETEROSCOPY AND STENT PLACEMENT Left 02/04/2021   Procedure: CYSTOSCOPY WITH RETROGRADE PYELOGRAM, URETEROSCOPY AND STENT PLACEMENT;  Surgeon: Rosalind Zachary NOVAK, MD;  Location: [facility];  Service: Urology;  Laterality: Left;  1 HR; nephrolithiasis   ECTOPIC PREGNANCY SURGERY  1980   EXTRACORPOREAL SHOCK  WAVE LITHOTRIPSY Left 06/23/2018   Procedure: LEFT EXTRACORPOREAL SHOCK WAVE LITHOTRIPSY (ESWL);  Surgeon: Sherrilee Belvie CROME, MD;  Location: [facility];  Service: Urology;  Laterality: Left;   GANGLION CYST EXCISION  1975   HOLMIUM LASER APPLICATION Left 02/04/2021   Procedure: HOLMIUM LASER APPLICATION;  Surgeon: Rosalind Zachary NOVAK, MD;  Location: [facility];  Service: Urology;  Laterality: Left;   PACEMAKER IMPLANT N/A 11/13/2020   Procedure: PACEMAKER IMPLANT;  Surgeon: Kelsie Agent, MD;  Location: [facility];  Service: Cardiovascular;  Laterality: N/A;   SPINE SURGERY  1997   TONSILLECTOMY  1957   adenoids removed   Social History[1] Social History   Social History Narrative   Not on file     Immunization History  Administered Date(s) Administered   Fluad Quad(high Dose 65+) 06/22/2019, 06/15/2022   Fluad Trivalent(High Dose 65+) 07/06/2023   INFLUENZA, HIGH DOSE SEASONAL PF 06/22/2019, 07/01/2020, 06/17/2021, 12/16/2021, 07/17/2024   Influenza Nasal 07/06/2023   Influenza Split 07/07/2011, 07/07/2012, 07/04/2013   Influenza,inj,Quad PF,6+ Mos 07/09/2018   Influenza-Unspecified 07/05/2014, 06/26/2015, 07/05/2016, 07/29/2017   Moderna Covid-19 Fall Seasonal Vaccine 27yrs & older 06/23/2022, 07/06/2023   Moderna SARS-COV2 Booster Vaccination 07/17/2024    Moderna Sars-Covid-2 Vaccination 11/07/2019, 12/07/2019, 07/29/2020, 07/06/2023   PFIZER Comirnaty(Gray Top)Covid-19 Tri-Sucrose Vaccine 01/11/2021   Pfizer Covid-19 Vaccine Bivalent Booster 75yrs & up 06/17/2021, 01/27/2022   Pneumococcal Conjugate-13 02/26/2015, 04/19/2017   Pneumococcal Polysaccharide-23 10/05/2009, 07/11/2018, 12/16/2021   Respiratory Syncytial Virus Vaccine,Recomb Aduvanted(Arexvy) 07/16/2022   Tdap 06/05/2009, 03/19/2020   Unspecified SARS-COV-2 Vaccination 06/23/2022   Zoster Recombinant(Shingrix) 07/20/2023   Zoster, Live 10/05/2009     Objective: Vital Signs: BP 136/74   Pulse 73   Temp 98.2 F (36.8 C)   Resp 14   Ht 5' 4 (1.626 m)   Wt 182 lb 3.2 oz (82.6 kg)   BMI 31.27 kg/m    Physical Exam Vitals and nursing note reviewed.  Constitutional:      Appearance: She is well-developed.  HENT:     Head: Normocephalic and atraumatic.  Eyes:     Conjunctiva/sclera: Conjunctivae normal.  Cardiovascular:     Rate and Rhythm: Normal rate and regular rhythm.     Heart sounds: Normal heart sounds.  Pulmonary:     Effort: Pulmonary effort is normal.     Breath sounds: Normal breath sounds.  Abdominal:     General: Bowel sounds are normal.     Palpations: Abdomen is soft.  Musculoskeletal:     Cervical back: Normal range of motion.  Lymphadenopathy:     Cervical: No cervical adenopathy.  Skin:    General: Skin is warm and dry.     Capillary Refill: Capillary refill takes less than 2 seconds.  Neurological:     Mental Status: She is alert and oriented to person, place, and time.  Psychiatric:        Behavior: Behavior normal.      Musculoskeletal Exam: Cervical spine was in good range of motion.  Thoracic kyphosis without any tenderness was noted.  There was no discomfort range of motion of her lumbar spine.  Shoulders, elbows, wrist joints, MCPs PIPs and DIPs were in good range of motion with no synovitis.  Hip joints and knee joints in good  range of motion with no synovitis.  There was no tenderness over ankles or MTPs.  CDAI Exam: CDAI Score: -- Patient Global: 0 / 100; Provider Global: 0 / 100 Swollen: --; Tender: -- Joint  Exam 10/12/2024   No joint exam has been documented for this visit   There is currently no information documented on the homunculus. Go to the Rheumatology activity and complete the homunculus joint exam.  Investigation: No additional findings.  Imaging: VAS US  LOWER EXTREMITY VENOUS (DVT) Result Date: 09/19/2024  Lower Venous DVT Study Patient Name:  Loria ANNE [facility]  Date of Exam:   09/19/2024 Medical Rec #: [id number]          Accession #:    [id number] Date of Birth: 09-Feb-1951          Patient Gender: F Patient Age:   49 years Exam Location:  [facility] Procedure:      VAS US  LOWER EXTREMITY VENOUS (DVT) Referring Phys: TIMOTHY OPYD --------------------------------------------------------------------------------  Indications: Pulmonary embolism.  Risk Factors: Confirmed PE. Anticoagulation: Heparin . Comparison Study: No prior studies. Performing Technologist: Cordella Collet RVT  Examination Guidelines: A complete evaluation includes B-mode imaging, spectral Doppler, color Doppler, and power Doppler as needed of all accessible portions of each vessel. Bilateral testing is considered an integral part of a complete examination. Limited examinations for reoccurring indications may be performed as noted. The reflux portion of the exam is performed with the patient in reverse Trendelenburg.  +---------+---------------+---------+-----------+----------+--------------+ RIGHT    CompressibilityPhasicitySpontaneityPropertiesThrombus Aging +---------+---------------+---------+-----------+----------+--------------+ CFV      Full           Yes      Yes                                 +---------+---------------+---------+-----------+----------+--------------+ SFJ      Full                                                         +---------+---------------+---------+-----------+----------+--------------+ FV Prox  Full                                                        +---------+---------------+---------+-----------+----------+--------------+ FV Mid   Full                                                        +---------+---------------+---------+-----------+----------+--------------+ FV DistalFull                                                        +---------+---------------+---------+-----------+----------+--------------+ PFV      Full                                                        +---------+---------------+---------+-----------+----------+--------------+ POP      Partial  Yes      Yes                  Acute          +---------+---------------+---------+-----------+----------+--------------+ PTV      Full                                                        +---------+---------------+---------+-----------+----------+--------------+ PERO     Full                                                        +---------+---------------+---------+-----------+----------+--------------+ Gastroc  Partial                                      Acute          +---------+---------------+---------+-----------+----------+--------------+   +---------+---------------+---------+-----------+----------+--------------+ LEFT     CompressibilityPhasicitySpontaneityPropertiesThrombus Aging +---------+---------------+---------+-----------+----------+--------------+ CFV      Full           Yes      Yes                                 +---------+---------------+---------+-----------+----------+--------------+ SFJ      Full                                                        +---------+---------------+---------+-----------+----------+--------------+ FV Prox  Full                                                         +---------+---------------+---------+-----------+----------+--------------+ FV Mid   Full                                                        +---------+---------------+---------+-----------+----------+--------------+ FV DistalFull                                                        +---------+---------------+---------+-----------+----------+--------------+ PFV      Full                                                        +---------+---------------+---------+-----------+----------+--------------+ POP      Full  Yes      Yes                                 +---------+---------------+---------+-----------+----------+--------------+ PTV      Full                                                        +---------+---------------+---------+-----------+----------+--------------+ PERO     Full                                                        +---------+---------------+---------+-----------+----------+--------------+     Summary: RIGHT: - Findings consistent with acute deep vein thrombosis involving the right popliteal vein. Findings consistent with acute intramuscular thrombosis involving the right gastrocnemius veins. - No cystic structure found in the popliteal fossa.  LEFT: - There is no evidence of deep vein thrombosis in the lower extremity.  - No cystic structure found in the popliteal fossa.  *See table(s) above for measurements and observations. Electronically signed by Lonni Gaskins MD on 09/19/2024 at 4:21:46 PM.    Final    ECHOCARDIOGRAM COMPLETE Result Date: 09/19/2024    ECHOCARDIOGRAM REPORT   Patient Name:   Chellsie ANNE [facility] Date of Exam: 09/19/2024 Medical Rec #:  [id number]         Height:       64.5 in Accession #:    [id number]        Weight:       180.8 lb Date of Birth:  08-Jan-1951         BSA:          1.884 m Patient Age:    73 years          BP:           157/82 mmHg Patient Gender: F                 HR:           83 bpm. Exam  Location:  Inpatient Procedure: 2D Echo, Cardiac Doppler and Color Doppler (Both Spectral and Color            Flow Doppler were utilized during procedure). Indications:    Pulmonary embolus 126.09; elevated troponin  History:        Patient has prior history of Echocardiogram examinations, most                 recent 06/09/2023. Pacemaker; Risk Factors:Hypertension, Sleep                 Apnea and Former Smoker.  Sonographer:    Merlynn Argyle Referring Phys: [id number] TIMOTHY S OPYD IMPRESSIONS  1. Left ventricular ejection fraction, by estimation, is 60 to 65%. The left ventricle has normal function. The left ventricle has no regional wall motion abnormalities. There is moderate concentric left ventricular hypertrophy. Left ventricular diastolic parameters are consistent with Grade I diastolic dysfunction (impaired relaxation). There is the interventricular septum is flattened in diastole ('D' shaped left ventricle), consistent with right ventricular volume overload.  2. Right ventricular systolic function is normal. The right  ventricular size is moderately enlarged. There is moderately elevated pulmonary artery systolic pressure. The estimated right ventricular systolic pressure is 48.2 mmHg.  3. Left atrial size was mild to moderately dilated.  4. Right atrial size was moderately dilated.  5. The mitral valve is normal in structure. No evidence of mitral valve regurgitation. No evidence of mitral stenosis.  6. Tricuspid valve regurgitation is moderate.  7. The aortic valve is tricuspid. Aortic valve regurgitation is not visualized. Aortic valve sclerosis/calcification is present, without any evidence of aortic stenosis.  8. The inferior vena cava is normal in size with greater than 50% respiratory variability, suggesting right atrial pressure of 3 mmHg. Comparison(s): Prior images reviewed side by side. Compared to the previous study the right ventricle is dilated and the systolic pulmonary artery pressure is higher,  but right ventricular systolic function remains preserved. FINDINGS  Left Ventricle: Left ventricular ejection fraction, by estimation, is 60 to 65%. The left ventricle has normal function. The left ventricle has no regional wall motion abnormalities. The left ventricular internal cavity size was normal in size. There is  moderate concentric left ventricular hypertrophy. The interventricular septum is flattened in diastole ('D' shaped left ventricle), consistent with right ventricular volume overload. Left ventricular diastolic parameters are consistent with Grade I diastolic dysfunction (impaired relaxation). Normal left ventricular filling pressure. Right Ventricle: The right ventricular size is moderately enlarged. No increase in right ventricular wall thickness. Right ventricular systolic function is normal. There is moderately elevated pulmonary artery systolic pressure. The tricuspid regurgitant  velocity is 3.36 m/s, and with an assumed right atrial pressure of 3 mmHg, the estimated right ventricular systolic pressure is 48.2 mmHg. Left Atrium: Left atrial size was mild to moderately dilated. Right Atrium: Right atrial size was moderately dilated. Pericardium: Trivial pericardial effusion is present. Mitral Valve: The mitral valve is normal in structure. No evidence of mitral valve regurgitation. No evidence of mitral valve stenosis. Tricuspid Valve: The tricuspid valve is normal in structure. Tricuspid valve regurgitation is moderate. Aortic Valve: The aortic valve is tricuspid. Aortic valve regurgitation is not visualized. Aortic valve sclerosis/calcification is present, without any evidence of aortic stenosis. Pulmonic Valve: The pulmonic valve was normal in structure. Pulmonic valve regurgitation is trivial. No evidence of pulmonic stenosis. Aorta: The aortic root is normal in size and structure. Venous: The inferior vena cava is normal in size with greater than 50% respiratory variability, suggesting  right atrial pressure of 3 mmHg. IAS/Shunts: No atrial level shunt detected by color flow Doppler. Additional Comments: A device lead is visualized in the right atrium and right ventricle.  LEFT VENTRICLE PLAX 2D LVIDd:         4.20 cm   Diastology LVIDs:         2.80 cm   LV e' medial:    5.13 cm/s LV PW:         1.40 cm   LV E/e' medial:  9.5 LV IVS:        1.20 cm   LV e' lateral:   8.08 cm/s LVOT diam:     1.90 cm   LV E/e' lateral: 6.0 LV SV:         66 LV SV Index:   35 LVOT Area:     2.84 cm LV IVRT:       85 msec  RIGHT VENTRICLE             IVC RV Basal diam:  4.40 cm     IVC  diam: 1.70 cm RV Mid diam:    4.10 cm RV S prime:     12.70 cm/s TAPSE (M-mode): 1.7 cm LEFT ATRIUM           Index        RIGHT ATRIUM           Index LA diam:      4.80 cm 2.55 cm/m   RA Area:     28.58 cm 15.17 cm/m LA Vol (A2C): 54.4 ml 28.87 ml/m  RA Volume:   75.18 ml  39.90 ml/m LA Vol (A4C): 45.6 ml 24.22 ml/m  AORTIC VALVE             PULMONIC VALVE LVOT Vmax:   143.00 cm/s PR End Diast Vel: 8.88 msec LVOT Vmean:  81.700 cm/s LVOT VTI:    0.232 m  AORTA Ao Root diam: 3.20 cm Ao Asc diam:  2.90 cm MITRAL VALVE               TRICUSPID VALVE MV Area (PHT): 2.29 cm    TR Peak grad:   45.2 mmHg MV Decel Time: 331 msec    TR Vmax:        336.00 cm/s MV E velocity: 48.70 cm/s MV A velocity: 92.40 cm/s  SHUNTS MV E/A ratio:  0.53        Systemic VTI:  0.23 m                            Systemic Diam: 1.90 cm Mihai Croitoru MD Electronically signed by Jerel Balding MD Signature Date/Time: 09/19/2024/4:05:16 PM    Final    CT Angio Chest PE W and/or Wo Contrast Result Date: 09/18/2024 EXAM: CTA of the Chest with contrast for PE 09/18/2024 08:46:26 PM TECHNIQUE: CTA of the chest was performed without and with the administration of 75 mL of intravenous iohexol  (OMNIPAQUE ) 350 MG/ML injection. Multiplanar reformatted images are provided for review. MIP images are provided for review. Automated exposure control, iterative  reconstruction, and/or weight based adjustment of the mA/kV was utilized to reduce the radiation dose to as low as reasonably achievable. COMPARISON: CT chest 06/02/2011. CLINICAL HISTORY: Pulmonary embolism (PE) suspected, high prob. FINDINGS: PULMONARY ARTERIES: Pulmonary arteries are adequately opacified for evaluation. Pulmonary emboli are seen within segmental branches of the bilateral upper lobes as well as within segmental and subsegmental branches of the bilateral lower lobes and right middle lobe. Main pulmonary artery is enlarged. MEDIASTINUM: The heart is moderately enlarged, a new finding. Left sided pacemaker is present. There is no acute abnormality of the thoracic aorta. The thyroid  gland is heterogeneous diffusely. Isthmus nodule measures 17 mm, new from prior. There is a 3.3 x 1.7 cm low density nodule in the right paratracheal region which has increased in size. LYMPH NODES: There are no new enlarged hilar or axillary lymph nodes identified. LUNGS AND PLEURA: Chronic appearing pleural parenchymal opacities with bronchiectasis in the bilateral upper lobes superior unchanged from 2012. There is a new subsolid nodular density in the right lower lobe measuring 11 mm (image 6/99). Ground glass slightly nodular area measures 12 mm in the right lower lobe (image 6/104) which is new from prior. There is no pleural effusion or pneumothorax. UPPER ABDOMEN: There is a small hiatal hernia. There is a cyst in the left lobe of the liver measuring 17 mm. SOFT TISSUES AND BONES: No acute bone or soft tissue abnormality. IMPRESSION: 1. Pulmonary emboli  within segmental branches of the bilateral upper lobes as well as within segmental and subsegmental branches of the bilateral lower lobes and right middle lobe. 2. New subsolid pulmonary nodules in the right lower lobe measuring 11 mm and 12 mm; recommend CT at 3-6 months to confirm persistence, and if unchanged with solid component 5 mm, then annual CT for 5  years, as per Fleischner Society Guidelines. 3. Moderate cardiomegaly. 4. Enlarged main pulmonary artery. 5. Enlarging right paratracheal lymph node. 6. Stable chronic parenchymal opacities in the bilateral upper lobes. Electronically signed by: Greig Pique MD 09/18/2024 09:12 PM EST RP Workstation: [id number]    Recent Labs: Lab Results  Component Value Date   WBC 17.3 (H) 10/09/2024   HGB 12.5 10/09/2024   PLT 249 10/09/2024   NA 142 10/09/2024   K 3.2 (L) 10/09/2024   CL 101 10/09/2024   CO2 30 10/09/2024   GLUCOSE 108 (H) 10/09/2024   BUN 18 10/09/2024   CREATININE 0.83 10/09/2024   BILITOT 0.6 10/09/2024   ALKPHOS 78 10/09/2024   AST 19 10/09/2024   ALT 9 10/09/2024   PROT 6.8 10/09/2024   ALBUMIN 3.8 10/09/2024   CALCIUM  9.1 10/09/2024   GFRAA 93 10/28/2020   QFTBGOLDPLUS NEGATIVE 06/29/2024    Speciality Comments: PLQ EYE EXAM 09/22/2023 normal [facility] [facility] f[facility]u [facility] months.  Procedures:  No procedures performed Allergies: Augmentin  [amoxicillin -pot clavulanate], Latex, Tramadol , Ciprofloxacin, and Flomax [tamsulosin hcl]   Assessment / Plan:     Visit Diagnoses: Seronegative rheumatoid arthritis (HCC) - RF negative, anti-CCP negative, positive ANA, synovitis and tenosynovitis noted on the ultrasound: -Patient had no synovitis on the examination.  She has been doing well on the combination of Enbrel  and methotrexate .  She requested refill on Enbrel  and methotrexate  today.  She continues to be on prednisone  7.5 mg p.o. daily not because of rheumatoid arthritis but due to chronic fever and myalgias.  Plan: etanercept  (ENBREL  MINI) 50 MG/ML injection.  Patient will be transferring care to [facility].  She has had no benefit with the treatment so far.  Positive ANA (antinuclear antibody) - Low titer ANA, all autoimmune workup negative.  High risk medication use - Enbrel  50 mg subcu weekly, methotrexate  0.8 mL subcu weekly and folic acid  2 mg p.o. daily.   Plaquenil  eye exam September 21, 2024 -she had labs done recently which showed elevated WBC count of 17.3 due to chronic prednisone  use and CMP was normal.  She was advised to get labs every 3 months.  Information regarding mineralization was placed in the AVS.  She was advised to hold Enbrel  and methotrexate  if she develops an infection.  Plan: etanercept  (ENBREL  MINI) 50 MG/ML injection  Current chronic use of systemic steroids-she continues to be on hydrocortisone  40 mg p.o. daily by endocrinology and 7.5 mg p.o. daily by pulmonology.  Fever of unknown origin - Recurrent use of prednisone  since February 2024.  Diagnosed with iatrogenic Cushing's syndrome by Dr. Faythe.  Patient states as soon as she tapers prednisone  her fevers come back.  She goes back to taking prednisone .  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  She was recently evaluated by her pulmonologist and was advised to stay on prednisone .  Bilateral pulmonary embolism (HCC)-recently diagnosed with bilateral pulmonary embolism and DVT.  She is on Eliquis .  She was evaluated by pulmonology, cardiology and hematology.  She was evaluated by Dr. Mona who mentioned acute right heart strain causing elevated troponin.  He  plans to repeat echocardiogram.  Dry eye syndrome of both eyes-she has been using over-the-counter products which has been helpful.  Age-related osteoporosis without current pathological fracture-DEXA scan January 08, 2024 showed BMD 0.834 g/cm and T-score -2.9 in the AP lumbar region.  She is on Fosamax  70 mg p.o. weekly.  Prescription refill for Fosamax  was given.  Other fatigue-continues to have fatigue.  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  Symptomatic sinus bradycardia  Obstructive  sleep apnea  Pulmonary hypertension (HCC)  Salzmann's nodular dystrophy of both eyes  Iatrogenic Cushing's syndrome  Abnormal SPEP  COVID-19 virus infection   Orders: No orders of the defined types were placed in this encounter.  Meds ordered this encounter  Medications   methotrexate  250 MG/10ML injection    Sig: Inject 0.8 mLs (20 mg total) into the skin every Monday. INJECT 0.8 MLS (20 MG TOTAL) INTO THE SKIN ONCE A WEEK FOR 8 DOSES. VIAL SHOULD ACCOMMODATE UP TO 12 DOSES OF MEDICATION.    Dispense:  10 mL    Refill:  0   etanercept  (ENBREL  MINI) 50 MG/ML injection    Sig: Inject 1 mL (50 mg total) into the skin every Wednesday. Inject 50mg  subcutaneously once per week on Wednesday late morning.    Dispense:  4 mL    Refill:  2   alendronate  (FOSAMAX ) 70 MG tablet    Sig: Take 1 tablet (70 mg total) by mouth once a week. Take with a full glass of water on an empty stomach.    Dispense:  12 tablet    Refill:  0    Follow-Up Instructions: Return if symptoms worsen or fail to improve, for Rheumatoid arthritis.   Maya Nash, MD  Note - This record has been created using Animal nutritionist.  Chart creation errors have been sought, but may not always  have been located. Such creation errors do not reflect on  the standard of medical care.     [1]  Social History Tobacco Use   Smoking status: Former    Current packs/day: 0.00    Average packs/day: 1.5 packs/day for 11.0 years (16.5 ttl pk-yrs)    Types: Cigarettes    Start date: 10/05/1965    Quit date: 10/05/1976    Years since quitting: 48.0    Passive exposure: Past   Smokeless tobacco: Never  Vaping Use   Vaping status: Never Used  Substance Use Topics   Alcohol use: Not Currently   Drug use: No   "

## 2024-10-12 ENCOUNTER — Other Ambulatory Visit (HOSPITAL_COMMUNITY): Payer: Self-pay

## 2024-10-12 ENCOUNTER — Ambulatory Visit: Attending: Rheumatology | Admitting: Rheumatology

## 2024-10-12 ENCOUNTER — Other Ambulatory Visit: Payer: Self-pay

## 2024-10-12 ENCOUNTER — Ambulatory Visit: Admitting: Physician Assistant

## 2024-10-12 ENCOUNTER — Encounter: Payer: Self-pay | Admitting: Rheumatology

## 2024-10-12 VITALS — BP 136/74 | HR 73 | Temp 98.2°F | Resp 14 | Ht 64.0 in | Wt 182.2 lb

## 2024-10-12 DIAGNOSIS — M810 Age-related osteoporosis without current pathological fracture: Secondary | ICD-10-CM

## 2024-10-12 DIAGNOSIS — C569 Malignant neoplasm of unspecified ovary: Secondary | ICD-10-CM

## 2024-10-12 DIAGNOSIS — Z7952 Long term (current) use of systemic steroids: Secondary | ICD-10-CM | POA: Diagnosis not present

## 2024-10-12 DIAGNOSIS — I2699 Other pulmonary embolism without acute cor pulmonale: Secondary | ICD-10-CM

## 2024-10-12 DIAGNOSIS — E242 Drug-induced Cushing's syndrome: Secondary | ICD-10-CM

## 2024-10-12 DIAGNOSIS — G4733 Obstructive sleep apnea (adult) (pediatric): Secondary | ICD-10-CM

## 2024-10-12 DIAGNOSIS — H18453 Nodular corneal degeneration, bilateral: Secondary | ICD-10-CM

## 2024-10-12 DIAGNOSIS — Z79899 Other long term (current) drug therapy: Secondary | ICD-10-CM | POA: Diagnosis not present

## 2024-10-12 DIAGNOSIS — L308 Other specified dermatitis: Secondary | ICD-10-CM

## 2024-10-12 DIAGNOSIS — H04123 Dry eye syndrome of bilateral lacrimal glands: Secondary | ICD-10-CM

## 2024-10-12 DIAGNOSIS — R778 Other specified abnormalities of plasma proteins: Secondary | ICD-10-CM

## 2024-10-12 DIAGNOSIS — Z950 Presence of cardiac pacemaker: Secondary | ICD-10-CM

## 2024-10-12 DIAGNOSIS — I272 Pulmonary hypertension, unspecified: Secondary | ICD-10-CM

## 2024-10-12 DIAGNOSIS — D869 Sarcoidosis, unspecified: Secondary | ICD-10-CM

## 2024-10-12 DIAGNOSIS — U071 COVID-19: Secondary | ICD-10-CM

## 2024-10-12 DIAGNOSIS — R7689 Other specified abnormal immunological findings in serum: Secondary | ICD-10-CM

## 2024-10-12 DIAGNOSIS — R001 Bradycardia, unspecified: Secondary | ICD-10-CM

## 2024-10-12 DIAGNOSIS — R509 Fever, unspecified: Secondary | ICD-10-CM

## 2024-10-12 DIAGNOSIS — M0600 Rheumatoid arthritis without rheumatoid factor, unspecified site: Secondary | ICD-10-CM | POA: Diagnosis not present

## 2024-10-12 DIAGNOSIS — R5383 Other fatigue: Secondary | ICD-10-CM

## 2024-10-12 DIAGNOSIS — D472 Monoclonal gammopathy: Secondary | ICD-10-CM

## 2024-10-12 LAB — PTT-LA MIX: PTT-LA Mix: 48.8 s — ABNORMAL HIGH (ref 0.0–40.5)

## 2024-10-12 LAB — DRVVT CONFIRM: dRVVT Confirm: 0.8 ratio (ref 0.8–1.2)

## 2024-10-12 LAB — PROTHROMBIN GENE MUTATION

## 2024-10-12 LAB — LUPUS ANTICOAGULANT PANEL
DRVVT: 114.4 s — ABNORMAL HIGH (ref 0.0–47.0)
PTT Lupus Anticoagulant: 50.1 s — ABNORMAL HIGH (ref 0.0–43.5)

## 2024-10-12 LAB — BETA-2-GLYCOPROTEIN I ABS, IGG/M/A
Beta-2 Glyco I IgG: <9 GPI IgG units (ref 0–20)
Beta-2-Glycoprotein I IgA: <9 GPI IgA units (ref 0–25)
Beta-2-Glycoprotein I IgM: <9 GPI IgM units (ref 0–32)

## 2024-10-12 LAB — HEXAGONAL PHASE PHOSPHOLIPID: Hexagonal Phase Phospholipid: 6 s (ref 0–11)

## 2024-10-12 LAB — DRVVT MIX: dRVVT Mix: 76.6 s — ABNORMAL HIGH (ref 0.0–40.4)

## 2024-10-12 MED ORDER — ENBREL MINI 50 MG/ML ~~LOC~~ SOCT
50.0000 mg | SUBCUTANEOUS | 2 refills | Status: AC
  Filled 2024-10-12 – 2024-11-08 (×2): qty 4, 28d supply, fill #0

## 2024-10-12 MED ORDER — METHOTREXATE SODIUM CHEMO INJECTION 250 MG/10ML: 20.0000 mg | INTRAMUSCULAR | 0 refills | Status: AC

## 2024-10-12 MED ORDER — ALENDRONATE SODIUM 70 MG PO TABS: 70.0000 mg | ORAL_TABLET | ORAL | 0 refills | Status: AC

## 2024-10-12 NOTE — Patient Instructions (Signed)

## 2024-10-13 LAB — FACTOR 5 LEIDEN

## 2024-10-19 ENCOUNTER — Telehealth: Payer: Self-pay | Admitting: Rheumatology

## 2024-10-19 DIAGNOSIS — M0600 Rheumatoid arthritis without rheumatoid factor, unspecified site: Secondary | ICD-10-CM

## 2024-10-19 NOTE — Telephone Encounter (Signed)
 Patient called stating she went to her appointment today with Dr. Ziolkowska and was not able to be seen.  Patient states she was told she needs to be referred to [facility].

## 2024-10-19 NOTE — Telephone Encounter (Signed)
 Patient states she was advised by Dr. Ziolkowska that she was at the wrong place and needs to be seen at the academic center at [facility]. Discussed with Dr. Dolphus and placing new rheumatology referral.

## 2024-10-23 ENCOUNTER — Telehealth: Payer: Self-pay | Admitting: Pharmacist

## 2024-10-23 NOTE — Telephone Encounter (Addendum)
 Submitted a Prior Authorization renewal request to [facility] ADVANTAGE/RX ADVANCE for ENBREL  via CoverMyMeds. Will update once we receive a response.  Key: [id number]    Prior Authorization duplicate/approved  Will need to submit once PA is expired on 11/08/2024  ----- Message from Nurse Alfonso DEL sent at 10/20/2024  1:07 PM EST ----- Patient contacted the office to request we start working on her Enbrel  PA as it runs out in February 2026. Patient requested a call to let her know that it is being worked on.

## 2024-10-25 ENCOUNTER — Telehealth: Payer: Self-pay | Admitting: *Deleted

## 2024-10-25 NOTE — Telephone Encounter (Signed)
 Patient advised she had labs in January. Her next labs will be in April and every 3 months. Patient advised that we will request an urgent referral to [facility].

## 2024-10-25 NOTE — Telephone Encounter (Signed)
 Patient contacted the office and left message stating she has gotten an appointment with [facility] but it is not until 06/07/2025. Patient states she is on the cancellation list. Patient was last seen in our office on October. Patient would like to know when you would like her to follow up and when she should have labs. She had a CBC and CMP on 10/09/2024 and TB Gold was in September 2025. Please advise.

## 2024-10-25 NOTE — Telephone Encounter (Signed)
 She had labs in January.  Her next labs will be in April and every 3 months.  Please notify patient that we will request an urgent referral to [facility].

## 2024-11-01 ENCOUNTER — Other Ambulatory Visit (HOSPITAL_BASED_OUTPATIENT_CLINIC_OR_DEPARTMENT_OTHER): Payer: Self-pay | Admitting: Internal Medicine

## 2024-11-01 ENCOUNTER — Encounter: Payer: Self-pay | Admitting: Rheumatology

## 2024-11-01 ENCOUNTER — Encounter (HOSPITAL_BASED_OUTPATIENT_CLINIC_OR_DEPARTMENT_OTHER): Payer: Self-pay | Admitting: Internal Medicine

## 2024-11-01 DIAGNOSIS — M810 Age-related osteoporosis without current pathological fracture: Secondary | ICD-10-CM

## 2024-11-02 ENCOUNTER — Ambulatory Visit: Admitting: Gastroenterology

## 2024-11-02 ENCOUNTER — Encounter: Payer: Self-pay | Admitting: Gastroenterology

## 2024-11-02 VITALS — Ht 64.0 in

## 2024-11-02 DIAGNOSIS — Z7901 Long term (current) use of anticoagulants: Secondary | ICD-10-CM | POA: Diagnosis not present

## 2024-11-02 DIAGNOSIS — Z86711 Personal history of pulmonary embolism: Secondary | ICD-10-CM

## 2024-11-02 DIAGNOSIS — I2699 Other pulmonary embolism without acute cor pulmonale: Secondary | ICD-10-CM

## 2024-11-02 DIAGNOSIS — Z01818 Encounter for other preprocedural examination: Secondary | ICD-10-CM

## 2024-11-02 DIAGNOSIS — Z860101 Personal history of adenomatous and serrated colon polyps: Secondary | ICD-10-CM | POA: Diagnosis not present

## 2024-11-02 NOTE — Progress Notes (Signed)
 Julia Solis                                          MRN: [id number]   11/02/2024   The VBCI Quality Team Specialist reviewed this patient medical record for the purposes of chart review for care gap closure. The following were reviewed: chart review for care gap closure-controlling blood pressure.    VBCI Quality Team

## 2024-11-02 NOTE — Progress Notes (Signed)
 "  Bliss Behnke [id number] 06-Apr-1951   Chief Complaint: Discuss colonoscopy  Referring Provider: Perri Ronal PARAS, MD Primary GI MD: Sampson (previous Dr. Eda)  HPI: Shriya Aker is a 74 y.o. female with past medical history of arthritis, GERD, DVT, PE, sleep apnea, HTN, MGUS, osteoporosis, sick sinus syndrome, presence of cardiac pacemaker, chronic anticoagulation on Eliquis , sarcoidosis in remission, hysterectomy, appendectomy who presents today to discuss colonoscopy.  Patient seen by cardiology 10/09/2024 for evaluation of elevated troponin.  Patient recently hospitalized with multiple subsegmental pulmonary emboli in December, was heparinized and switched to Eliquis .  Echo showed normal LVEF with signs of RV volume overload, normal RV systolic function and moderate RV enlargement within RVSP of 48.2 mmHg.  Findings were new compared to prior echo in 2024.  She is undergoing hypercoagulable workup with hematology.  Cardiology planning for repeat echocardiogram, scheduled to be completed 11/08/2024.  Family history of colon polyps (sister). Family history of colon cancer (grandmother).  Discussed the use of AI scribe software for clinical note transcription with the patient, who gave verbal consent to proceed.  History of Present Illness Hallelujah Wysong is a 74 year old female with a history of multiple colon polyps, pulmonary embolism on chronic anticoagulation, and active rheumatoid arthritis who presents for consultation regarding timing and preparation for her next screening colonoscopy.  Colorectal Cancer Surveillance: - Due for surveillance colonoscopy following prior removal of four tubular adenomas - Seeking guidance on timing and preparation for colonoscopy - Considering scheduling colonoscopy after April visitor and May car trip - Experienced severe gas pain after last colonoscopy, requiring simethicone; wishes to avoid similar discomfort - Prefers Miralax for  bowel preparation; stimulant laxatives such as Senokot have caused significant abdominal pain and cramping in the past - Open to other bowel preps as long as it does not include Senokot  Anticoagulation Management: - On Eliquis  for multiple pulmonary emboli - Hematology advised to remain on anticoagulation at least until end of March, with possible indefinite continuation - No prior use of Eliquis  before current regimen  Gastrointestinal Symptoms: - No abdominal pain, nausea, vomiting, blood in stool, or changes in stool color - Mild constipation occurred with two iron pills daily, resolved with reduction to one pill  Gastroesophageal Reflux Disease: - Takes famotidine  nightly - Acid reflux is well controlled  Rheumatoid Arthritis: - Active disease managed with steroids, methotrexate , and Enbrel  - Attempts to taper steroids have resulted in fevers and other symptoms - Occasional low-grade fevers even without steroid changes - Referral to academic center scheduled for September for further management  Iron Deficiency Anemia: - Takes iron supplementation for previously low hemoglobin - Constipation with higher dose resolved with dose reduction    Previous GI Procedures/Imaging   Colonoscopy 11/13/2021 - Non-bleeding internal hemorrhoids.  - Diverticulosis in the sigmoid colon.  - Four 1 to 3 mm polyps in the ascending colon, removed with a cold snare. Resected and retrieved. - The examination was otherwise normal on direct and retroflexion views. - Recall 3 years Path: Surgical [P], colon, ascending, polyp (4)  - TUBULAR ADENOMA(S) - NEGATIVE FOR HIGH-GRADE DYSPLASIA OR MALIGNANCY  Past Medical History:  Diagnosis Date   Arthritis 2024   Cancer [facility]) 2011   ovarian   Cataract 2024   Complication of anesthesia    DVT (deep venous thrombosis) (HCC)    GERD (gastroesophageal reflux disease)    Hearing aid worn    both ears   History of chemotherapy 2011  History of hiatal  hernia    History of kidney stones    Hypertension    MGUS (monoclonal gammopathy of unknown significance)    dx by hematology per patient   Neuromuscular disorder (HCC)    left foot numbness since  eback surgery   Osteoporosis    PONV (postoperative nausea and vomiting)    Presence of permanent cardiac pacemaker    Pulmonary embolism (HCC)    Sarcoidosis    in remission sees pumonary  once a year dr bellinger lov 08-21-2020 care everywhere   Sick sinus syndrome [facility])    Sleep apnea 2024   Wears glasses     Past Surgical History:  Procedure Laterality Date   ABDOMINAL HYSTERECTOMY  2011   APPENDECTOMY  2011    ?with hysterectomy   BACK SURGERY  1997   lumbar   CARPAL TUNNEL RELEASE Right    CATARACT EXTRACTION Right 08/10/2023   CATARACT EXTRACTION Left 08/24/2023   CESAREAN SECTION  1982, 1985   COSMETIC SURGERY  2007   brow lift   CYSTOSCOPY WITH RETROGRADE PYELOGRAM, URETEROSCOPY AND STENT PLACEMENT Left 02/04/2021   Procedure: CYSTOSCOPY WITH RETROGRADE PYELOGRAM, URETEROSCOPY AND STENT PLACEMENT;  Surgeon: Rosalind Zachary NOVAK, MD;  Location: [facility];  Service: Urology;  Laterality: Left;  1 HR; nephrolithiasis   ECTOPIC PREGNANCY SURGERY  1980   EXTRACORPOREAL SHOCK WAVE LITHOTRIPSY Left 06/23/2018   Procedure: LEFT EXTRACORPOREAL SHOCK WAVE LITHOTRIPSY (ESWL);  Surgeon: Sherrilee Belvie CROME, MD;  Location: [facility];  Service: Urology;  Laterality: Left;   GANGLION CYST EXCISION  1975   HOLMIUM LASER APPLICATION Left 02/04/2021   Procedure: HOLMIUM LASER APPLICATION;  Surgeon: Rosalind Zachary NOVAK, MD;  Location: [facility];  Service: Urology;  Laterality: Left;   PACEMAKER IMPLANT N/A 11/13/2020   Procedure: PACEMAKER IMPLANT;  Surgeon: Kelsie Agent, MD;  Location: [facility];  Service: Cardiovascular;  Laterality: N/A;   SPINE SURGERY  1997   TONSILLECTOMY  1957   adenoids removed    Current Outpatient Medications  Medication  Sig Dispense Refill   alendronate  (FOSAMAX ) 70 MG tablet Take 1 tablet (70 mg total) by mouth once a week. Take with a full glass of water on an empty stomach. 12 tablet 0   apixaban  (ELIQUIS ) 5 MG TABS tablet Take 1 tablet (5 mg total) by mouth 2 (two) times daily. 60 tablet 5   Cholecalciferol (VITAMIN D ) 50 MCG (2000 UT) tablet Take 2,000 Units by mouth daily.     etanercept  (ENBREL  MINI) 50 MG/ML injection Inject 1 mL (50 mg total) into the skin every Wednesday. Inject 50mg  subcutaneously once per week on Wednesday late morning. 4 mL 2   famotidine  (PEPCID ) 20 MG tablet Take 20 mg by mouth at bedtime.     Ferrous Sulfate (IRON) 325 (65 Fe) MG TABS Take 1 tablet by mouth daily with supper.     fluticasone (FLONASE) 50 MCG/ACT nasal spray Place 1 spray into the nose every Monday, Wednesday, and Friday.     folic acid  (FOLVITE ) 1 MG tablet Take 2 tablets (2 mg total) by mouth daily. (Patient taking differently: Take 2 mg by mouth daily with supper.) 180 tablet 3   furosemide  (LASIX ) 20 MG tablet Take 1 tablet (20 mg total) by mouth daily. 30 tablet 1   hydrocortisone  (CORTEF ) 10 MG tablet Take three tablets by mouth daily between 6 am to 8 am. Take one tablet by mouth around 2 pm daily.  LORazepam  (ATIVAN ) 1 MG tablet TAKE 1 TABLET BY MOUTH EVERY DAY AT BEDTIME AS NEEDED (Patient taking differently: Take 0.5-1 mg by mouth at bedtime. Take one-half (0.5mg ) to one whole (1mg ) tablet by mouth at bedtime for sleep.) 90 tablet 1   losartan  (COZAAR ) 100 MG tablet TAKE 1 TABLET BY MOUTH EVERY DAY 90 tablet 1   methotrexate  250 MG/10ML injection Inject 0.8 mLs (20 mg total) into the skin every Monday. INJECT 0.8 MLS (20 MG TOTAL) INTO THE SKIN ONCE A WEEK FOR 8 DOSES. VIAL SHOULD ACCOMMODATE UP TO 12 DOSES OF MEDICATION. 10 mL 0   Multiple Vitamin (MULTIVITAMIN WITH MINERALS) TABS tablet Take 1 tablet by mouth daily with supper.     oxyCODONE  (OXY IR/ROXICODONE ) 5 MG immediate release tablet Take 5 mg by  mouth every 4 (four) hours as needed for moderate pain (pain score 4-6), severe pain (pain score 7-10) or breakthrough pain.     Polyethyl Glycol-Propyl Glycol (SYSTANE ULTRA) 0.4-0.3 % SOLN Place 1 drop into both eyes every morning.     Polyethyl Glycol-Propyl Glycol (SYSTANE) 0.4-0.3 % GEL ophthalmic gel Place 1 application  into both eyes at bedtime.     predniSONE  (DELTASONE ) 5 MG tablet Take 5-7.5 mg by mouth daily with breakfast. Take one and one-half tablets (7.5mg ) by mouth once daily in the morning with breakfast.  May take an additional one tablet mg) by mouth as needed for active symptoms.     rosuvastatin  (CRESTOR ) 5 MG tablet TAKE 1 TABLET BY MOUTH 3 TIMES A WEEK WITH SUPPER (Patient taking differently: Take 5 mg by mouth every Monday, Wednesday, and Friday. TAKE 1 TABLET BY MOUTH 3 TIMES A WEEK ON M-W-F WITH SUPPER) 36 tablet 2   sodium chloride  (OCEAN) 0.65 % SOLN nasal spray Place 1 spray into both nostrils as needed for congestion.     No current facility-administered medications for this visit.    Allergies as of 11/02/2024 - Review Complete 10/12/2024  Allergen Reaction Noted   Augmentin  [amoxicillin -pot clavulanate] Nausea And Vomiting and Other (See Comments) 10/25/2023   Latex Other (See Comments)    Tramadol  Other (See Comments) 02/05/2021   Ciprofloxacin Rash    Flomax [tamsulosin hcl] Other (See Comments) 06/17/2018    Family History  Problem Relation Age of Onset   Hypertension Mother    Hearing loss Mother    Prostate cancer Father 1   Cancer Father    Hypertension Father    Stroke Father    Colon polyps Sister    Colon cancer Maternal Grandmother        dx in her 46s   Diabetes Maternal Grandmother    Heart disease Maternal Grandfather    Hearing loss Maternal Grandfather    Colon cancer Paternal Grandmother    Heart disease Paternal Grandfather    Breast cancer Paternal Aunt 18   Colon cancer Paternal Aunt        dx in her 40s   Melanoma Cousin 20    Breast cancer Cousin 5       multiple cousins   Colon polyps Cousin    Breast cancer Cousin 85   Breast cancer Cousin        father's maternal 1st cousin   Breast cancer Cousin        Father's first cousin's daughter   Cancer Other        maternal great uncle with Cancer - NOS   Cancer Other  maternal great grandmother with either colon or stomach cancer   Breast cancer Other        fathers maternal aunt    Social History[1]   Review of Systems:    Constitutional: No weight loss, fever, chills Cardiovascular: No chest pain Respiratory: No SOB  Gastrointestinal: See HPI and otherwise negative   Physical Exam:  Vital signs: Ht 5' 4 (1.626 m)   BMI 31.27 kg/m   Wt Readings from Last 3 Encounters:  10/12/24 182 lb 3.2 oz (82.6 kg)  10/09/24 181 lb (82.1 kg)  10/09/24 181 lb 14.4 oz (82.5 kg)     Constitutional: Pleasant, well-appearing female in NAD, alert and cooperative Head:  Normocephalic and atraumatic.  Respiratory: Respirations even and unlabored. Lungs clear to auscultation bilaterally.  No wheezes, crackles, or rhonchi.  Cardiovascular:  Regular rate and rhythm. No murmurs. No peripheral edema. Gastrointestinal:  Soft, nondistended, nontender. No rebound or guarding. Normal bowel sounds. No appreciable masses or hepatomegaly. Rectal:  Not performed.  Neurologic:  Alert and oriented x4;  grossly normal neurologically.  Skin:   Dry and intact without significant lesions or rashes. Psychiatric: Oriented to person, place and time. Demonstrates good judgement and reason without abnormal affect or behaviors.   Echocardiogram 09/19/2024 1. Left ventricular ejection fraction, by estimation, is 60 to 65% . The left ventricle has normal function. The left ventricle has no regional wall motion abnormalities. There is moderate concentric left ventricular hypertrophy. Left ventricular diastolic parameters are consistent with Grade I diastolic dysfunction ( impaired  relaxation) . There is the interventricular septum is flattened in diastole ('D' shaped left ventricle) , consistent with right ventricular volume overload.  2. Right ventricular systolic function is normal. The right ventricular size is moderately enlarged. There is moderately elevated pulmonary artery systolic pressure. The estimated right ventricular systolic pressure is 48. 2 mmHg.  3. Left atrial size was mild to moderately dilated.  4. Right atrial size was moderately dilated.  5. The mitral valve is normal in structure. No evidence of mitral valve regurgitation. No evidence of mitral stenosis.  6. Tricuspid valve regurgitation is moderate.  7. The aortic valve is tricuspid. Aortic valve regurgitation is not visualized. Aortic valve sclerosis/ calcification is present, without any evidence of aortic stenosis.  8. The inferior vena cava is normal in size with greater than 50% respiratory variability, suggesting right atrial pressure of 3 mmHg.  Assessment/Plan:   Assessment & Plan History of adenomatous colon polyps History of pulmonary embolism on chronic anticoagulation Patient seen today to discuss surveillance colonoscopy.  Last procedure done 11/13/2021 revealed 4 tubular adenomas and was advised to have recall in 3 years.  She was recently hospitalized for multiple subsegmental pulmonary emboli in December and was started on Eliquis .  Currently undergoing further workup with hematology as well as cardiology with plan for echocardiogram 11/08/2024. States she has been advised by hematologist that Eliquis  could be held for procedure beginning in March, however she would like to postpone colonoscopy little longer than this due to busy schedule in April and May.  4 tubular adenomas <10 mm, surveillance interval of 3-5 years appropriate, patient asymptomatic from a GI standpoint and likely low risk to postpone routine surveillance colonoscopy in light of recent PE and chronic anticoagulation.  Discussed this with patient today.   Will plan to have her follow-up in office in April to schedule colonoscopy at that time.  Will need to request permission to hold her Eliquis  and obtain cardiac clearance for procedure.  Assigned to Dr. Nandigam today.   Camie Furbish, PA-C [facility] 11/03/2024, 9:55 AM  Patient Care Team: Perri Ronal PARAS, MD as PCP - General (Internal Medicine) Mealor, Eulas BRAVO, MD as Consulting Physician (Cardiology) Dolphus Reiter, MD as Consulting Physician (Rheumatology) Mona Vinie BROCKS, MD as Consulting Physician (Cardiology) Cheryl Waddell HERO, PA-C as Physician Assistant (Physician Assistant) Alvenia, Tawni Ee, MD as Referring Physician (Pulmonary Disease)       [1]  Social History Tobacco Use   Smoking status: Former    Current packs/day: 0.00    Average packs/day: 1.5 packs/day for 11.0 years (16.5 ttl pk-yrs)    Types: Cigarettes    Start date: 10/05/1965    Quit date: 10/05/1976    Years since quitting: 48.1    Passive exposure: Past   Smokeless tobacco: Never  Vaping Use   Vaping status: Never Used  Substance Use Topics   Alcohol use: Not Currently   Drug use: No   "

## 2024-11-02 NOTE — Patient Instructions (Signed)
 It has been recommended to you by your physician that you have a(n) colonoscopy completed. Per your request, we did not schedule the procedure(s) today. Please contact our office at [phone] should you decide to have the procedure completed. You will be scheduled for a pre-visit and procedure at that time.  _______________________________________________________  If your blood pressure at your visit was 140/90 or greater, please contact your primary care physician to follow up on this.  _______________________________________________________  If you are age 19 or older, your body mass index should be between 23-30. Your Body mass index is 31.27 kg/m. If this is out of the aforementioned range listed, please consider follow up with your Primary Care Provider.  If you are age 38 or younger, your body mass index should be between 19-25. Your Body mass index is 31.27 kg/m. If this is out of the aformentioned range listed, please consider follow up with your Primary Care Provider.   ________________________________________________________  The [place] [facility] providers would like to encourage you to use MYCHART to communicate with providers for non-urgent requests or questions.  Due to long hold times on the telephone, sending your provider a message by [facility] may be a faster and more efficient way to get a response.  Please allow 48 business hours for a response.  Please remember that this is for non-urgent requests.  _______________________________________________________  [facility] is using a team-based approach to care.  Your team is made up of your doctor and two to three APPS. Our APPS (Nurse Practitioners and Physician Assistants) work with your physician to ensure care continuity for you. They are fully qualified to address your health concerns and develop a treatment plan. They communicate directly with your gastroenterologist to care for you. Seeing the Advanced Practice  Practitioners on your physician's team can help you by facilitating care more promptly, often allowing for earlier appointments, access to diagnostic testing, procedures, and other specialty referrals.

## 2024-11-03 ENCOUNTER — Encounter: Payer: Self-pay | Admitting: Gastroenterology

## 2024-11-08 ENCOUNTER — Other Ambulatory Visit: Payer: Self-pay | Admitting: Pharmacist

## 2024-11-08 ENCOUNTER — Other Ambulatory Visit: Payer: Self-pay

## 2024-11-08 ENCOUNTER — Other Ambulatory Visit (HOSPITAL_COMMUNITY): Payer: Self-pay

## 2024-11-08 ENCOUNTER — Ambulatory Visit (HOSPITAL_COMMUNITY)
Admission: RE | Admit: 2024-11-08 | Discharge: 2024-11-08 | Disposition: A | Source: Ambulatory Visit | Attending: Internal Medicine | Admitting: Internal Medicine

## 2024-11-08 DIAGNOSIS — I2609 Other pulmonary embolism with acute cor pulmonale: Secondary | ICD-10-CM

## 2024-11-08 LAB — ECHOCARDIOGRAM LIMITED
AR max vel: 2.58 cm2
AV Area VTI: 2.71 cm2
AV Area mean vel: 2.64 cm2
AV Mean grad: 5 mmHg
AV Peak grad: 10.9 mmHg
Ao pk vel: 1.65 m/s
Area-P 1/2: 2.59 cm2
S' Lateral: 2.39 cm

## 2024-11-08 NOTE — Progress Notes (Signed)
 Clinical Intervention Note  Clinical Intervention Notes: Patient reports starting Eliquis  10mg , No DDIs indentified with her Enbrel .   Clinical Intervention Outcomes: Prevention of an adverse drug event   Lyle LELON Chalk Specialty Pharmacist

## 2024-11-08 NOTE — Telephone Encounter (Signed)
 Submitted a Prior Authorization request to [facility] [employer] for ENBREL  via CoverMyMeds. Will update once we receive a response.  Key: [id number]    Prior Authorization duplicate/approved  Sherry Pennant, PharmD, MPH, BCPS, CPP Clinical Pharmacist

## 2024-11-08 NOTE — Progress Notes (Signed)
 Specialty Pharmacy Refill Coordination Note  Julia Solis is a 74 y.o. female contacted today regarding refills of specialty medication(s) Etanercept  (Enbrel  Mini)   Patient requested [facility] at [facility] at [place] date: 11/15/24   Medication will be filled on: 11/14/24   Patient is aware of the $700.33 copay.

## 2024-11-09 ENCOUNTER — Ambulatory Visit

## 2024-11-09 ENCOUNTER — Telehealth: Payer: Self-pay

## 2024-11-09 NOTE — Telephone Encounter (Signed)
 We have attempted to get patient a sooner (urgent) appointment at [facility] as she is currently scheduled in September 2026. We have not been successful.  I spoke with Dr. Dolphus and then called the patient and offered a referral to [facility] or [facility]. Patient declined right now due to the drive. Patient will reach out to her pulmonologist at [facility] to see if she can assist in getting a sooner rheumatology appointment. Patient will call to inform us .

## 2024-11-10 LAB — CUP PACEART REMOTE DEVICE CHECK
Date Time Interrogation Session: 20260206092824
Implantable Lead Connection Status: 753985
Implantable Lead Connection Status: 753985
Implantable Lead Implant Date: 20220201
Implantable Lead Implant Date: 20220201
Implantable Lead Location: 753859
Implantable Lead Location: 753860
Implantable Pulse Generator Implant Date: 20220201
Pulse Gen Model: 2272
Pulse Gen Serial Number: 3896658

## 2024-11-22 ENCOUNTER — Ambulatory Visit: Admitting: Physician Assistant

## 2025-01-02 ENCOUNTER — Ambulatory Visit (INDEPENDENT_AMBULATORY_CARE_PROVIDER_SITE_OTHER): Admitting: Otolaryngology

## 2025-02-08 ENCOUNTER — Ambulatory Visit

## 2025-04-02 ENCOUNTER — Inpatient Hospital Stay

## 2025-04-02 ENCOUNTER — Inpatient Hospital Stay: Admitting: Oncology

## 2025-05-10 ENCOUNTER — Ambulatory Visit

## 2025-07-17 ENCOUNTER — Inpatient Hospital Stay

## 2025-07-31 ENCOUNTER — Inpatient Hospital Stay: Admitting: Oncology

## 2025-08-09 ENCOUNTER — Ambulatory Visit

## 2025-08-27 ENCOUNTER — Other Ambulatory Visit

## 2025-09-03 ENCOUNTER — Ambulatory Visit: Admitting: Internal Medicine
